# Patient Record
Sex: Male | Born: 1945
Health system: Southern US, Community
[De-identification: ages and names within clinical notes are randomized; demographics above are authoritative.]

## PROBLEM LIST (undated history)

## (undated) DIAGNOSIS — R42 Dizziness and giddiness: Secondary | ICD-10-CM

## (undated) DIAGNOSIS — Z9889 Other specified postprocedural states: Secondary | ICD-10-CM

## (undated) DIAGNOSIS — G629 Polyneuropathy, unspecified: Secondary | ICD-10-CM

## (undated) DIAGNOSIS — K648 Other hemorrhoids: Secondary | ICD-10-CM

## (undated) DIAGNOSIS — I2510 Atherosclerotic heart disease of native coronary artery without angina pectoris: Secondary | ICD-10-CM

## (undated) DIAGNOSIS — G4730 Sleep apnea, unspecified: Secondary | ICD-10-CM

## (undated) DIAGNOSIS — F329 Major depressive disorder, single episode, unspecified: Secondary | ICD-10-CM

## (undated) DIAGNOSIS — R112 Nausea with vomiting, unspecified: Secondary | ICD-10-CM

## (undated) DIAGNOSIS — J45909 Unspecified asthma, uncomplicated: Secondary | ICD-10-CM

## (undated) DIAGNOSIS — M1990 Unspecified osteoarthritis, unspecified site: Secondary | ICD-10-CM

## (undated) DIAGNOSIS — F4310 Post-traumatic stress disorder, unspecified: Secondary | ICD-10-CM

## (undated) DIAGNOSIS — K219 Gastro-esophageal reflux disease without esophagitis: Secondary | ICD-10-CM

## (undated) DIAGNOSIS — I10 Essential (primary) hypertension: Secondary | ICD-10-CM

## (undated) DIAGNOSIS — T4145XA Adverse effect of unspecified anesthetic, initial encounter: Secondary | ICD-10-CM

## (undated) DIAGNOSIS — E785 Hyperlipidemia, unspecified: Secondary | ICD-10-CM

## (undated) DIAGNOSIS — Z87442 Personal history of urinary calculi: Secondary | ICD-10-CM

## (undated) DIAGNOSIS — F32A Depression, unspecified: Secondary | ICD-10-CM

## (undated) DIAGNOSIS — F419 Anxiety disorder, unspecified: Secondary | ICD-10-CM

## (undated) DIAGNOSIS — I82409 Acute embolism and thrombosis of unspecified deep veins of unspecified lower extremity: Secondary | ICD-10-CM

## (undated) DIAGNOSIS — D126 Benign neoplasm of colon, unspecified: Secondary | ICD-10-CM

## (undated) HISTORY — DX: Acute embolism and thrombosis of unspecified deep veins of unspecified lower extremity: I82.409

## (undated) HISTORY — DX: Other hemorrhoids: K64.8

## (undated) HISTORY — DX: Benign neoplasm of colon, unspecified: D12.6

## (undated) HISTORY — DX: Depression, unspecified: F32.A

## (undated) HISTORY — DX: Hyperlipidemia, unspecified: E78.5

## (undated) HISTORY — DX: Polyneuropathy, unspecified: G62.9

## (undated) HISTORY — DX: Dizziness and giddiness: R42

## (undated) HISTORY — PX: UPPER GASTROINTESTINAL ENDOSCOPY: SHX188

## (undated) HISTORY — DX: Sleep apnea, unspecified: G47.30

## (undated) HISTORY — PX: ESOPHAGOGASTRODUODENOSCOPY ENDOSCOPY: SHX5814

## (undated) HISTORY — DX: Unspecified asthma, uncomplicated: J45.909

## (undated) HISTORY — DX: Essential (primary) hypertension: I10

## (undated) HISTORY — PX: COLONOSCOPY: SHX174

## (undated) HISTORY — PX: HEMORRHOID BANDING: SHX5850

## (undated) HISTORY — DX: Post-traumatic stress disorder, unspecified: F43.10

---
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
## 2000-09-02 ENCOUNTER — Other Ambulatory Visit: Admission: RE | Admit: 2000-09-02 | Discharge: 2000-09-02 | Payer: Self-pay | Admitting: Internal Medicine

## 2000-09-02 ENCOUNTER — Encounter (INDEPENDENT_AMBULATORY_CARE_PROVIDER_SITE_OTHER): Payer: Self-pay | Admitting: Specialist

## 2003-11-25 ENCOUNTER — Emergency Department (HOSPITAL_COMMUNITY): Admission: EM | Admit: 2003-11-25 | Discharge: 2003-11-25 | Payer: Self-pay | Admitting: *Deleted

## 2004-06-23 HISTORY — PX: KNEE SURGERY: SHX244

## 2006-06-23 DIAGNOSIS — I82409 Acute embolism and thrombosis of unspecified deep veins of unspecified lower extremity: Secondary | ICD-10-CM

## 2006-06-23 HISTORY — DX: Acute embolism and thrombosis of unspecified deep veins of unspecified lower extremity: I82.409

## 2006-12-20 ENCOUNTER — Ambulatory Visit: Payer: Self-pay | Admitting: Internal Medicine

## 2006-12-20 ENCOUNTER — Inpatient Hospital Stay (HOSPITAL_COMMUNITY): Admission: EM | Admit: 2006-12-20 | Discharge: 2006-12-22 | Payer: Self-pay | Admitting: Emergency Medicine

## 2008-08-15 ENCOUNTER — Encounter: Admission: RE | Admit: 2008-08-15 | Discharge: 2008-08-15 | Payer: Self-pay | Admitting: Otolaryngology

## 2008-10-19 ENCOUNTER — Encounter (INDEPENDENT_AMBULATORY_CARE_PROVIDER_SITE_OTHER): Payer: Self-pay | Admitting: *Deleted

## 2008-10-31 ENCOUNTER — Ambulatory Visit: Payer: Self-pay | Admitting: Internal Medicine

## 2008-11-14 ENCOUNTER — Ambulatory Visit: Payer: Self-pay | Admitting: Internal Medicine

## 2008-12-05 ENCOUNTER — Ambulatory Visit (HOSPITAL_COMMUNITY): Admission: RE | Admit: 2008-12-05 | Discharge: 2008-12-05 | Payer: Self-pay | Admitting: Family Medicine

## 2009-03-07 ENCOUNTER — Observation Stay (HOSPITAL_COMMUNITY): Admission: EM | Admit: 2009-03-07 | Discharge: 2009-03-09 | Payer: Self-pay | Admitting: Emergency Medicine

## 2009-11-26 ENCOUNTER — Observation Stay (HOSPITAL_COMMUNITY): Admission: EM | Admit: 2009-11-26 | Discharge: 2009-11-28 | Payer: Self-pay | Admitting: Emergency Medicine

## 2009-11-27 ENCOUNTER — Encounter (INDEPENDENT_AMBULATORY_CARE_PROVIDER_SITE_OTHER): Payer: Self-pay | Admitting: Emergency Medicine

## 2009-11-27 ENCOUNTER — Ambulatory Visit: Payer: Self-pay | Admitting: Vascular Surgery

## 2010-09-09 LAB — URINALYSIS, ROUTINE W REFLEX MICROSCOPIC
Hgb urine dipstick: NEGATIVE
Ketones, ur: NEGATIVE mg/dL
Specific Gravity, Urine: 1.039 — ABNORMAL HIGH (ref 1.005–1.030)
pH: 5.5 (ref 5.0–8.0)

## 2010-09-09 LAB — BASIC METABOLIC PANEL
BUN: 18 mg/dL (ref 6–23)
Chloride: 105 mEq/L (ref 96–112)
Creatinine, Ser: 1.31 mg/dL (ref 0.4–1.5)
GFR calc non Af Amer: 55 mL/min — ABNORMAL LOW (ref >60)
Glucose, Bld: 90 mg/dL (ref 70–99)
Sodium: 135 mEq/L (ref 135–145)

## 2010-09-09 LAB — LIPID PANEL
HDL: 36 mg/dL — ABNORMAL LOW (ref >39)
LDL Cholesterol: 100 mg/dL — ABNORMAL HIGH (ref 0–99)
LDL Cholesterol: 93 mg/dL (ref 0–99)
Total CHOL/HDL Ratio: 4.2 RATIO
Total CHOL/HDL Ratio: 4.3 RATIO
Triglycerides: 78 mg/dL (ref <150)
Triglycerides: 95 mg/dL (ref <150)
VLDL: 16 mg/dL (ref 0–40)
VLDL: 19 mg/dL (ref 0–40)

## 2010-09-09 LAB — COMPREHENSIVE METABOLIC PANEL
ALT: 16 U/L (ref 0–53)
Alkaline Phosphatase: 64 U/L (ref 39–117)
BUN: 17 mg/dL (ref 6–23)
Calcium: 8.4 mg/dL (ref 8.4–10.5)
Creatinine, Ser: 1.36 mg/dL (ref 0.4–1.5)
GFR calc non Af Amer: 53 mL/min — ABNORMAL LOW (ref >60)
Glucose, Bld: 144 mg/dL — ABNORMAL HIGH (ref 70–99)
Total Bilirubin: 0.9 mg/dL (ref 0.3–1.2)
Total Protein: 6 g/dL (ref 6.0–8.3)

## 2010-09-09 LAB — DIFFERENTIAL
Basophils Absolute: 0 10*3/uL (ref 0.0–0.1)
Monocytes Relative: 10 % (ref 3–12)
Neutrophils Relative %: 62 % (ref 43–77)

## 2010-09-09 LAB — CBC
HCT: 45.3 % (ref 39.0–52.0)
Hemoglobin: 15.5 g/dL (ref 13.0–17.0)
MCHC: 34.3 g/dL (ref 30.0–36.0)
RBC: 4.65 MIL/uL (ref 4.22–5.81)

## 2010-09-09 LAB — PROTIME-INR: Prothrombin Time: 13.2 seconds (ref 11.6–15.2)

## 2010-09-09 LAB — CK TOTAL AND CKMB (NOT AT ARMC): Relative Index: INVALID (ref 0.0–2.5)

## 2010-09-09 LAB — HEMOGLOBIN A1C
Hgb A1c MFr Bld: 5.8 % — ABNORMAL HIGH (ref <5.7)
Mean Plasma Glucose: 120 mg/dL — ABNORMAL HIGH (ref <117)

## 2010-09-27 LAB — DIFFERENTIAL
Basophils Absolute: 0 10*3/uL (ref 0.0–0.1)
Basophils Relative: 0 % (ref 0–1)
Eosinophils Absolute: 0 10*3/uL (ref 0.0–0.7)
Eosinophils Relative: 0 % (ref 0–5)
Monocytes Absolute: 0.3 10*3/uL (ref 0.1–1.0)
Neutro Abs: 11 10*3/uL — ABNORMAL HIGH (ref 1.7–7.7)

## 2010-09-27 LAB — BASIC METABOLIC PANEL
BUN: 20 mg/dL (ref 6–23)
Creatinine, Ser: 1.24 mg/dL (ref 0.4–1.5)
GFR calc non Af Amer: 59 mL/min — ABNORMAL LOW (ref >60)

## 2010-09-27 LAB — CBC
HCT: 37.6 % — ABNORMAL LOW (ref 39.0–52.0)
MCV: 98.8 fL (ref 78.0–100.0)
Platelets: 255 10*3/uL (ref 150–400)
Platelets: 230 10*3/uL (ref 150–400)
WBC: 13 10*3/uL — ABNORMAL HIGH (ref 4.0–10.5)
WBC: 11.6 10*3/uL — ABNORMAL HIGH (ref 4.0–10.5)

## 2010-09-27 LAB — COMPREHENSIVE METABOLIC PANEL
ALT: 16 U/L (ref 0–53)
AST: 24 U/L (ref 0–37)
Albumin: 3.7 g/dL (ref 3.5–5.2)
Alkaline Phosphatase: 61 U/L (ref 39–117)
BUN: 18 mg/dL (ref 6–23)
Chloride: 106 mEq/L (ref 96–112)
Potassium: 3.4 mEq/L — ABNORMAL LOW (ref 3.5–5.1)
Total Bilirubin: 0.8 mg/dL (ref 0.3–1.2)

## 2010-09-27 LAB — POCT CARDIAC MARKERS: Myoglobin, poc: 155 ng/mL (ref 12–200)

## 2010-11-05 NOTE — H&P (Signed)
Andrew Bates, Andrew Bates                ACCOUNT NO.:  [id number]   MEDICAL RECORD NO.:  [id number]          PATIENT TYPE:  INP   LOCATION:  A219                          FACILITY:  [facility]   PHYSICIAN:  Dorris Singh, DO    DATE OF BIRTH:  10-Mar-1946   DATE OF ADMISSION:  12/20/2006  DATE OF DISCHARGE:  LH                              HISTORY & PHYSICAL   CHIEF COMPLAINT:  Chest pain.   HISTORY OF PRESENT ILLNESS:  The patient is a 65 year old, Caucasian  male who presented to the [facility] Emergency Room with a 3-day history  of chest pain.  He stated the pain was gradual.  He had noted that it  was located in the left chest with some radiation to his left arm as  well as left jaw, waxing and waning.  He stated that the pain was rated  6/10 at its worst.  He did admit to having some nausea, but also admits  to having nausea and motion sickness with his diagnosed Meniere's  disease.  At that point in time, he stated he just did not feel right  and his wife then brought him to the emergency room for evaluation.   PAST MEDICAL HISTORY:  1. Meniere's disease.  2. Asthma.  3. Bronchitis.   PAST SURGICAL HISTORY:  Knee surgery on right knee for meniscal tear.   SOCIAL HISTORY:  The patient is a nondrinker, probably socially, if  that.  Denies any drug abuse, but states that he quit smoking over 10  years ago and would only smoke cigars at that point in time.   ALLERGIES:  CODEINE.   CURRENT MEDICATIONS:  Niacin oral and vitamin B12 orally daily.  He uses  this for the treatment of his Meniere's disease.   REVIEW OF SYSTEMS:  GENERAL:  He denies any night sweats or weight  change, but did have weakness.  HEENT:  Denies any blurred vision.  He  does have problems with vertigo.  Denies any bleeding gums, sore throat  or hoarseness, but is positive for jaw pain.  CARDIAC:  Positive for  chest pain and chest pressure.  RESPIRATORY:  Positive shortness of  breath.  GI:  Positive for  vomiting.  GU:  Negative for hesitancy,  urgency or loss of control.  MUSCULOSKELETAL:  Positive left arm pain.  ENDOCRINE:  Positive for sweating.   PHYSICAL EXAMINATION:  VITAL SIGNS:  Temperature 97.3, pulse 49,  respirations 16, systolic blood pressure 122, diastolic 73.  GENERAL:  This is a 65 year old, Caucasian male who is well-developed,  well-nourished in no acute distress.  Affect is appropriate and hygiene  is good.  SKIN:  Tan, no scars, bruises or tattoos noted.  HEENT:  Head is normocephalic, atraumatic.  Eyes are equal and reactive  to lightly bilaterally.  No discharge.  External canals are intact.  Nose with no discharge and symmetrical.  Teeth are in fair repair.  No  erythema or exudate.  NECK:  Full range of motion, no tracheal deviation.  Thyroid is normal.  HEART:  Regular rate and rhythm without  murmur noted.  S1, S2.  LUNGS:  Chest is symmetrical with respirations.  Clear to auscultation  bilaterally with no wheezes, rales or rhonchi.  ABDOMEN:  Soft, nontender, nondistended, no rebound, masses or guarding  noted.  MUSCULOSKELETAL:  No muscle atrophy.  Full range of joint motion of all  four extremities.  Strength is 5/5.  NEUROLOGIC:  Cranial nerves 2-12 grossly intact.   LABORATORY DATA AND X-RAY FINDINGS:  While in the emergency room, EKG  showed sinus bradycardia as well as repeat EKG this morning, June 30,  showing sinus bradycardia.  The patient had a chest x-ray done that  showed minimal atelectasis at the left lung base, otherwise no acute  findings.   Prothrombin time was done with INR of 0.9.  His basic metabolic panel  with sodium 137, potassium 3.5, chloride 105, carbon dioxide 24, glucose  81, BUN 19, creatinine 1.13.  CBC was done with white count of 8.6,  hemoglobin 16.1, hematocrit 46.5, platelets 276.  Will do cardiac  markers today x2.   ASSESSMENT:  1. Chest pain.  2. Bradycardia.   PLAN:  Will admit the patient to the service of  [facility].  Will place  the patient on telemetry as well as doing serial cardiac markers and  will obtain a cardiology consult.  EKG showed bradycardia.  Will have  cardiology consult and participate regarding that as well.  Will  continue with GI and DVT prophylaxis.  Will continue to monitor the  patient.      Dorris Singh, DO  Electronically Signed     CB/MEDQ  D:  12/21/2006  T:  12/21/2006  Job:  [id number]

## 2010-11-05 NOTE — Discharge Summary (Signed)
NAMEKEMPER, [name]                ACCOUNT NO.:  [id number]   MEDICAL RECORD NO.:  [id number]          PATIENT TYPE:  INP   LOCATION:  A219                          FACILITY:  [facility]   PHYSICIAN:  Dorris Singh, DO    DATE OF BIRTH:  1946-04-08   DATE OF ADMISSION:  12/20/2006  DATE OF DISCHARGE:  07/01/2008LH                               DISCHARGE SUMMARY   ADMISSION DIAGNOSIS:  Chest pain.   DISCHARGE DIAGNOSIS:  Chest pain.   SERVICE:  Patient was admitted to the service of [facility].   PRIMARY CARE PHYSICIAN:  Not listed.   PROCEDURE:  He had a portable chest x-ray on June 29th which showed  minimal atelectasis at the left lung base, otherwise no acute findings.  He had a myocardial perfusion test done that showed a normal stress  Myoview as well as a perfusion ejection fraction which showed an  ejection fraction of 51.   CONSULTATIONS:  [facility] [place] consultant participated on this case.   HISTORY:  Patient is a 65 year old Caucasian male who presented to the  [facility] emergency room with a complaint of chest pain lasting several  days.  Patient then was admitted, and [facility] consulted, who  performed several cardiac tests, a cardiac stress test as well as a  myoperfusion test.  Patient was also monitored for bradycardia that was  noted on ECG in the ER as well as on the floor.  The patient proceeded  through his hospital stay without any changes.  At that point in time,  he was viewed to be stable on July 1 after his testing, and it was  recommended that the patient could then be discharged to home.   DISCHARGE CONDITION:  Good, stable.   DISPOSITION:  Discharge to home.   CURRENT MEDICATIONS:  Patient was not currently on any medications prior  to admission other than niacin and B12, so he will continue those.   His instructions are to follow up with cardiology if he has any other  problems and to follow up with his primary care  physician.      Dorris Singh, DO  Electronically Signed     CB/MEDQ  D:  12/22/2006  T:  12/22/2006  Job:  [id number]

## 2010-11-05 NOTE — Consult Note (Signed)
NAME:  HASSON, GASPARD NO.:  [id number]   MEDICAL RECORD NO.:  [id number]          PATIENT TYPE:  INP   LOCATION:  A219                          FACILITY:  [facility]   PHYSICIAN:  Pricilla Riffle, MD, FACCDATE OF BIRTH:  [birth year]   DATE OF CONSULTATION:  12/21/2006  DATE OF DISCHARGE:                                 CONSULTATION   IDENTIFICATION:  [name] is a 65 year old gentleman whom we are asked  to see regarding chest discomfort and bradycardia.   HISTORY OF PRESENT ILLNESS:  The patient notes over the past several  days an episode of left-sided chest pressure with also associated left  arm discomfort, arm numbness, hand numbness and left jaw discomfort and  left neck pain.  Denies injury.  At worst 6/10.  Waxes and wanes; never  goes away.  No period without discomfort.  Worse in the sitting and  lying position.  No exacerbation with activity.  Has had a headache for  the past 3-4 days, top of head.  [place]  Had a bad episode of dizziness, Meniere's.  Had his head in ice.  A lot  of retching.  Currently though denies reflux symptoms.   Note - for the past 3-4 weeks, he just has not felt well, decreased  energy, decreased appetite, slight nausea.   ALLERGIES:  CODEINE.   PAST MEDICAL HISTORY:  1. Meniere's disease, treated.  Never seen by ENT.  Treated with      niacin and B6.  2. Asthma 1-2 times per year.  3. Bronchitis.   PAST SURGICAL HISTORY:  Right knee surgery.   FAMILY HISTORY:  Aunts and uncles with CAD in their 79s and 30s.  CVA.  Mom died of old age.  Dad had hypertension and cancer.  Two siblings  without CAD.   SOCIAL HISTORY:  The patient drinks occasionally.  Remote occasional  cigar.   REVIEW OF SYSTEMS:  Some shortness of breath, vomiting.  Again,  otherwise all systems reviewed.  Negative for the above problems.   The patient took Zocor for a while in the past.  Reported good.  Stopped.   MEDICATIONS AT HOME:  1. Niacin.  2. B6.   MEDICATIONS HERE IN THE HOSPITAL:  1. Aspirin.  2. Protonix 40.  3. GI cocktail.  4. Lovenox 40 daily.   PHYSICAL EXAMINATION:  GENERAL:  The patient is in no distress other  than he does complain of some left-sided chest discomfort and arm  discomfort.  It never really eased.  HEENT:  Normocephalic, atraumatic.  Extraocular movements intact.  Pupils equal, round and reactive to light.  NECK:  JVP is normal.  No thyromegaly or bruits.  LUNGS:  Clear without wheezes.  CARDIAC:  Regular rate and rhythm.  S1-S2.  No S3 or S4 or murmurs.  ABDOMEN:  Benign.  No masses.  No hepatomegaly.  EXTREMITIES:  Good posterior tibial pulses, 2+.  No lower extremity  edema.  Feet warm.   CHEST X-RAY:  Minimal atelectasis.  Otherwise, no acute disease.   ELECTROCARDIOGRAM:  A 12-lead EKG - sinus bradycardia at 51 beats per  minute, early transition.   IMPRESSION:  The patient is a 65 year old with no prior cardiac history  who has not felt right in weeks.  Decreased appetite, decreased energy,  slight nausea.  Over the past 3-4 days began experiencing some chest  pressure, arm pain, numbness, jaw pain.  Has a history of severe  Meniere's exacerbation Friday with a lot of retching.  Question GI,  question musculoskeletal, question cardiac.   The pain and discomfort in the arm, chest and neck are atypical in that  it has not gone away x days, and despite this, initial markers are  negative for injury.  EKG is negative but with relative bradycardia.  Patient asymptomatic.  Would follow.   RECOMMENDATIONS:  Continue to rule out MI.  Continue telemetry, Protonix  b.i.d.  Check lipids in a.m.  Check CRP.  Check TSH.  Check D-dimer.  If  rules out, stress Myoview prior to discharge.  If positive,  catheterization.  Will continue to follow sinus.   Meniere's patient.  Should probably have an ENT evaluation.      Pricilla Riffle, MD, [facility]   Electronically Signed     PVR/MEDQ  D:  12/21/2006  T:  12/21/2006  Job:  [id number]

## 2010-12-11 ENCOUNTER — Ambulatory Visit: Payer: Non-veteran care | Admitting: Physical Therapy

## 2011-04-08 LAB — LIPID PANEL
HDL: 28 — ABNORMAL LOW
Total CHOL/HDL Ratio: 6.1
VLDL: 28

## 2011-04-09 LAB — DIFFERENTIAL
Basophils Absolute: 0
Eosinophils Relative: 6 — ABNORMAL HIGH
Lymphocytes Relative: 27
Lymphocytes Relative: 27
Lymphs Abs: 1.8
Lymphs Abs: 2.3
Monocytes Absolute: 0.8 — ABNORMAL HIGH
Monocytes Absolute: 1 — ABNORMAL HIGH
Neutro Abs: 4.7

## 2011-04-09 LAB — CBC
HCT: 43
Hemoglobin: 14.8
Hemoglobin: 16.1
RBC: 4.9
RDW: 12.7
WBC: 6.8
WBC: 8.6

## 2011-04-09 LAB — TROPONIN I: Troponin I: 0.03

## 2011-04-09 LAB — BASIC METABOLIC PANEL
Calcium: 8.8
GFR calc Af Amer: >60
GFR calc non Af Amer: >60
GFR calc non Af Amer: >60
Glucose, Bld: 81
Potassium: 4
Sodium: 137
Sodium: 141

## 2011-04-09 LAB — POCT CARDIAC MARKERS
Myoglobin, poc: 45.1
Operator id: 228551
Operator id: 228551

## 2011-04-09 LAB — AMYLASE: Amylase: 94

## 2011-04-09 LAB — D-DIMER, QUANTITATIVE: D-Dimer, Quant: 0.22

## 2011-04-09 LAB — CARDIAC PANEL(CRET KIN+CKTOT+MB+TROPI)
CK, MB: 1.4
Relative Index: INVALID
Total CK: 68

## 2011-04-09 LAB — TSH: TSH: 1.772

## 2012-06-23 HISTORY — PX: CARPAL TUNNEL RELEASE: SHX101

## 2012-11-12 ENCOUNTER — Encounter: Payer: Self-pay | Admitting: Internal Medicine

## 2012-11-29 ENCOUNTER — Other Ambulatory Visit (INDEPENDENT_AMBULATORY_CARE_PROVIDER_SITE_OTHER): Payer: Medicare Other

## 2012-11-29 ENCOUNTER — Encounter: Payer: Self-pay | Admitting: Internal Medicine

## 2012-11-29 ENCOUNTER — Ambulatory Visit (INDEPENDENT_AMBULATORY_CARE_PROVIDER_SITE_OTHER): Payer: Medicare Other | Admitting: Internal Medicine

## 2012-11-29 VITALS — BP 140/80 | HR 70 | Ht 71.5 in | Wt 209.4 lb

## 2012-11-29 DIAGNOSIS — K648 Other hemorrhoids: Secondary | ICD-10-CM

## 2012-11-29 DIAGNOSIS — Z86010 Personal history of colonic polyps: Secondary | ICD-10-CM

## 2012-11-29 DIAGNOSIS — R300 Dysuria: Secondary | ICD-10-CM

## 2012-11-29 DIAGNOSIS — K219 Gastro-esophageal reflux disease without esophagitis: Secondary | ICD-10-CM

## 2012-11-29 LAB — URINALYSIS, ROUTINE W REFLEX MICROSCOPIC
Ketones, ur: NEGATIVE
RBC / HPF: NONE SEEN (ref 0–?)
Specific Gravity, Urine: 1.015 (ref 1.000–1.030)
Total Protein, Urine: NEGATIVE
Urine Glucose: NEGATIVE
pH: 6 (ref 5.0–8.0)

## 2012-11-29 MED ORDER — HYDROCORTISONE ACETATE 25 MG RE SUPP: 25.0000 mg | Freq: Every day | RECTAL | Status: DC

## 2012-11-29 NOTE — Progress Notes (Signed)
HISTORY OF PRESENT ILLNESS:  Andrew Bates is a 67 y.o. male with the below listed medical history who presents today with symptomatic hemorrhoids. He is followed in this office for GERD and a history of adenomatous colon polyps on index colonoscopy in 2002. Followup colonoscopy in 2005 and again in 2010 were negative for neoplasia. Internal hemorrhoids noted. He is accompanied today by his wife. Patient reports a 6 month history of prolapsing hemorrhoids and difficulty cleaning. No pain. Occasional trivial blood on the tissue. No change in bowel habits. He has been using over-the-counter gel. His GI review of systems is otherwise negative. For GERD he continues on Prilosec. Good control symptoms  REVIEW OF SYSTEMS:  All non-GI ROS negative except for sinus and allergy trouble, arthritis, back pain, visual change, headaches, hearing problems, vertigo, sleeping problems, and dysuria (requests a urinalysis)  Past Medical History  Diagnosis Date  . Adenomatous colon polyp   . Internal hemorrhoids   . Hypertension   . Hyperlipidemia   . Vertigo   . DVT (deep venous thrombosis) 2008  . Asthma   . Kidney stone   . Peripheral neuropathy     Past Surgical History  Procedure Laterality Date  . Right knee surgery  2006    Social History [name]  reports that he has quit smoking. His smoking use included Cigars. He has never used smokeless tobacco. He reports that he does not drink alcohol or use illicit drugs.  family history includes Hyperlipidemia in his father; Hypertension in his father; Lung cancer in his father; and Thyroid cancer in his mother.  Allergies  Allergen Reactions  . Codeine     REACTION: Headache  . Pentazocine Nausea And Vomiting    Headaches       PHYSICAL EXAMINATION: Vital signs: BP 140/80  Pulse 70  Ht 5' 11.5" (1.816 m)  Wt 209 lb 6.4 oz (94.983 kg)  BMI 28.8 kg/m2  Constitutional: generally well-appearing, no acute distress Psychiatric: alert  and oriented x3, cooperative Eyes: extraocular movements intact, anicteric, conjunctiva pink Mouth: oral pharynx moist, no lesions Neck: supple no lymphadenopathy Cardiovascular: heart regular rate and rhythm, no murmur Lungs: clear to auscultation bilaterally Abdomen: soft, nontender, nondistended, no obvious ascites, no peritoneal signs, normal bowel sounds, no organomegaly Rectal: Prolapsing hemorrhoids. Enlarged smooth prostate. No tenderness. Hemoccult negative stool Extremities: no lower extremity edema bilaterally Skin: no lesions on visible extremities Neuro: No focal deficits.   ASSESSMENT:  #1. Internal hemorrhoids. Symptomatic #2. GERD. Symptoms controlled with PPI #3. History of adenomatous colon polyps. Last colonoscopy 2010 #4. Dysuria. Requests urinalysis  PLAN:  #1. Daily fiber supplementation with Metamucil one to 2 tablespoons #2. Sitz baths twice a day #3. Anusol-HC suppositories at night #4. Urinalysis today. Contact patient with results. If abnormal, see his PCP #5. Continue PPI and reflux precautions #6. Surveillance colonoscopy around May 2015

## 2012-11-29 NOTE — Patient Instructions (Addendum)
Your physician has requested that you go to the basement for the following lab work before leaving today: Urinalysis.    Use a fiber supplement such as Metamucil 1-2 tablespoons daily. We have given you samples to get started.  We have sent the following medications to your pharmacy for you to pick up at your convenience: Anusol HC suppositories.    Use a [employer] daily.Sitz Bath A sitz bath is a warm water bath taken in the sitting position that covers only the hips and buttocks. It may be used for either healing or hygiene purposes. Sitz baths are also used to relieve pain, itching, or muscle spasms. The water may contain medicine. Moist heat will help you heal and relax.  HOME CARE INSTRUCTIONS  Take 3 to 4 sitz baths a day. 1. Fill the bathtub half full with warm water. 2. Sit in the water and open the drain a little. 3. Turn on the warm water to keep the tub half full. Keep the water running constantly. 4. Soak in the water for 15 to 20 minutes. 5. After the sitz bath, pat the affected area dry first. SEEK MEDICAL CARE IF:  You get worse instead of better. Stop the sitz baths if you get worse. MAKE SURE YOU:  Understand these instructions.  Will watch your condition.  Will get help right away if you are not doing well or get worse. Document Released: 03/01/2004 Document Revised: 03/03/2012 Document Reviewed: 09/06/2010 [facility] Patient Information 2014 [place], [place].   cc: Assunta Found, MD

## 2013-09-29 ENCOUNTER — Encounter: Payer: Self-pay | Admitting: Internal Medicine

## 2013-11-16 ENCOUNTER — Encounter (HOSPITAL_COMMUNITY): Payer: Self-pay | Admitting: Emergency Medicine

## 2013-11-16 ENCOUNTER — Inpatient Hospital Stay (HOSPITAL_COMMUNITY)
Admission: EM | Admit: 2013-11-16 | Discharge: 2013-11-18 | DRG: 872 | Disposition: A | Discharge status: Home / Self Care | Payer: Medicare HMO | Attending: Internal Medicine | Admitting: Internal Medicine

## 2013-11-16 ENCOUNTER — Emergency Department (HOSPITAL_COMMUNITY): Payer: Medicare HMO

## 2013-11-16 DIAGNOSIS — I739 Peripheral vascular disease, unspecified: Secondary | ICD-10-CM | POA: Diagnosis present

## 2013-11-16 DIAGNOSIS — Z8249 Family history of ischemic heart disease and other diseases of the circulatory system: Secondary | ICD-10-CM

## 2013-11-16 DIAGNOSIS — Z87442 Personal history of urinary calculi: Secondary | ICD-10-CM

## 2013-11-16 DIAGNOSIS — R3911 Hesitancy of micturition: Secondary | ICD-10-CM | POA: Diagnosis present

## 2013-11-16 DIAGNOSIS — Z86718 Personal history of other venous thrombosis and embolism: Secondary | ICD-10-CM

## 2013-11-16 DIAGNOSIS — Z87891 Personal history of nicotine dependence: Secondary | ICD-10-CM

## 2013-11-16 DIAGNOSIS — J45909 Unspecified asthma, uncomplicated: Secondary | ICD-10-CM | POA: Diagnosis present

## 2013-11-16 DIAGNOSIS — N419 Inflammatory disease of prostate, unspecified: Secondary | ICD-10-CM | POA: Diagnosis present

## 2013-11-16 DIAGNOSIS — G589 Mononeuropathy, unspecified: Secondary | ICD-10-CM | POA: Diagnosis present

## 2013-11-16 DIAGNOSIS — N390 Urinary tract infection, site not specified: Secondary | ICD-10-CM

## 2013-11-16 DIAGNOSIS — N200 Calculus of kidney: Secondary | ICD-10-CM | POA: Diagnosis present

## 2013-11-16 DIAGNOSIS — E785 Hyperlipidemia, unspecified: Secondary | ICD-10-CM | POA: Diagnosis present

## 2013-11-16 DIAGNOSIS — R42 Dizziness and giddiness: Secondary | ICD-10-CM | POA: Diagnosis present

## 2013-11-16 DIAGNOSIS — I10 Essential (primary) hypertension: Secondary | ICD-10-CM | POA: Diagnosis present

## 2013-11-16 DIAGNOSIS — Z808 Family history of malignant neoplasm of other organs or systems: Secondary | ICD-10-CM

## 2013-11-16 DIAGNOSIS — K219 Gastro-esophageal reflux disease without esophagitis: Secondary | ICD-10-CM | POA: Diagnosis present

## 2013-11-16 DIAGNOSIS — N10 Acute tubulo-interstitial nephritis: Secondary | ICD-10-CM | POA: Diagnosis present

## 2013-11-16 DIAGNOSIS — E663 Overweight: Secondary | ICD-10-CM | POA: Diagnosis present

## 2013-11-16 DIAGNOSIS — Z801 Family history of malignant neoplasm of trachea, bronchus and lung: Secondary | ICD-10-CM

## 2013-11-16 DIAGNOSIS — A419 Sepsis, unspecified organism: Principal | ICD-10-CM | POA: Diagnosis present

## 2013-11-16 DIAGNOSIS — N12 Tubulo-interstitial nephritis, not specified as acute or chronic: Secondary | ICD-10-CM

## 2013-11-16 LAB — COMPREHENSIVE METABOLIC PANEL
ALBUMIN: 3.9 g/dL (ref 3.5–5.2)
ALT: 26 U/L (ref 0–53)
AST: 18 U/L (ref 0–37)
Alkaline Phosphatase: 114 U/L (ref 39–117)
BILIRUBIN TOTAL: 1.6 mg/dL — AB (ref 0.3–1.2)
BUN: 15 mg/dL (ref 6–23)
CHLORIDE: 98 meq/L (ref 96–112)
CO2: 24 mEq/L (ref 19–32)
CREATININE: 1.18 mg/dL (ref 0.50–1.35)
Calcium: 9.4 mg/dL (ref 8.4–10.5)
GFR calc non Af Amer: 62 mL/min — ABNORMAL LOW (ref >90)
GFR, EST AFRICAN AMERICAN: 72 mL/min — AB (ref >90)
GLUCOSE: 116 mg/dL — AB (ref 70–99)
Potassium: 4.1 mEq/L (ref 3.7–5.3)
Sodium: 136 mEq/L — ABNORMAL LOW (ref 137–147)
TOTAL PROTEIN: 7.4 g/dL (ref 6.0–8.3)

## 2013-11-16 LAB — CK: CK TOTAL: 115 U/L (ref 7–232)

## 2013-11-16 LAB — URINALYSIS, ROUTINE W REFLEX MICROSCOPIC
GLUCOSE, UA: NEGATIVE mg/dL
Ketones, ur: 15 mg/dL — AB
NITRITE: NEGATIVE
PROTEIN: 30 mg/dL — AB
Specific Gravity, Urine: 1.015 (ref 1.005–1.030)
UROBILINOGEN UA: 1 mg/dL (ref 0.0–1.0)
pH: 7 (ref 5.0–8.0)

## 2013-11-16 LAB — URINE MICROSCOPIC-ADD ON

## 2013-11-16 LAB — CBC WITH DIFFERENTIAL/PLATELET
BASOS ABS: 0 10*3/uL (ref 0.0–0.1)
BASOS PCT: 0 % (ref 0–1)
EOS PCT: 0 % (ref 0–5)
Eosinophils Absolute: 0 10*3/uL (ref 0.0–0.7)
HEMATOCRIT: 46.3 % (ref 39.0–52.0)
Hemoglobin: 15.9 g/dL (ref 13.0–17.0)
Lymphocytes Relative: 7 % — ABNORMAL LOW (ref 12–46)
Lymphs Abs: 1.1 10*3/uL (ref 0.7–4.0)
MCH: 32.6 pg (ref 26.0–34.0)
MCHC: 34.3 g/dL (ref 30.0–36.0)
MCV: 94.9 fL (ref 78.0–100.0)
MONO ABS: 2.3 10*3/uL — AB (ref 0.1–1.0)
MONOS PCT: 13 % — AB (ref 3–12)
Neutro Abs: 14 10*3/uL — ABNORMAL HIGH (ref 1.7–7.7)
Neutrophils Relative %: 80 % — ABNORMAL HIGH (ref 43–77)
Platelets: 200 10*3/uL (ref 150–400)
RBC: 4.88 MIL/uL (ref 4.22–5.81)
RDW: 13.4 % (ref 11.5–15.5)
WBC: 17.5 10*3/uL — ABNORMAL HIGH (ref 4.0–10.5)

## 2013-11-16 LAB — CBG MONITORING, ED: Glucose-Capillary: 132 mg/dL — ABNORMAL HIGH (ref 70–99)

## 2013-11-16 LAB — LACTIC ACID, PLASMA: Lactic Acid, Venous: 1.4 mmol/L (ref 0.5–2.2)

## 2013-11-16 MED ORDER — GABAPENTIN 400 MG PO CAPS
1200.0000 mg | ORAL_CAPSULE | Freq: Three times a day (TID) | ORAL | Status: DC
  Administered 2013-11-16 – 2013-11-17 (×2): 800 mg via ORAL
  Filled 2013-11-16 (×2): qty 3

## 2013-11-16 MED ORDER — ACETAMINOPHEN 325 MG PO TABS
ORAL_TABLET | ORAL | Status: AC
  Administered 2013-11-16: 650 mg via ORAL
  Filled 2013-11-16: qty 2

## 2013-11-16 MED ORDER — ASPIRIN EC 81 MG PO TBEC
81.0000 mg | DELAYED_RELEASE_TABLET | Freq: Every morning | ORAL | Status: DC
  Administered 2013-11-17 – 2013-11-18 (×2): 81 mg via ORAL
  Filled 2013-11-16 (×2): qty 1

## 2013-11-16 MED ORDER — SODIUM CHLORIDE 0.9 % IV BOLUS (SEPSIS)
500.0000 mL | Freq: Once | INTRAVENOUS | Status: AC
  Administered 2013-11-16: 500 mL via INTRAVENOUS

## 2013-11-16 MED ORDER — MORPHINE SULFATE 4 MG/ML IJ SOLN
4.0000 mg | Freq: Once | INTRAMUSCULAR | Status: AC
  Administered 2013-11-16: 4 mg via INTRAVENOUS
  Filled 2013-11-16: qty 1

## 2013-11-16 MED ORDER — DIAZEPAM 2 MG PO TABS: 2.0000 mg | ORAL_TABLET | Freq: Three times a day (TID) | ORAL | Status: DC | PRN

## 2013-11-16 MED ORDER — ACETAMINOPHEN 325 MG PO TABS
650.0000 mg | ORAL_TABLET | Freq: Four times a day (QID) | ORAL | Status: DC | PRN
  Administered 2013-11-16: 650 mg via ORAL

## 2013-11-16 MED ORDER — PROMETHAZINE HCL 25 MG/ML IJ SOLN
12.5000 mg | Freq: Once | INTRAMUSCULAR | Status: AC
  Administered 2013-11-16: 12.5 mg via INTRAVENOUS
  Filled 2013-11-16: qty 1

## 2013-11-16 MED ORDER — ACETAMINOPHEN 325 MG PO TABS
650.0000 mg | ORAL_TABLET | Freq: Four times a day (QID) | ORAL | Status: DC | PRN
  Administered 2013-11-17 – 2013-11-18 (×2): 650 mg via ORAL
  Filled 2013-11-16 (×2): qty 2

## 2013-11-16 MED ORDER — ZOLPIDEM TARTRATE 5 MG PO TABS
5.0000 mg | ORAL_TABLET | Freq: Once | ORAL | Status: AC
  Administered 2013-11-16: 5 mg via ORAL
  Filled 2013-11-16: qty 1

## 2013-11-16 MED ORDER — CIPROFLOXACIN IN D5W 400 MG/200ML IV SOLN
400.0000 mg | Freq: Two times a day (BID) | INTRAVENOUS | Status: DC
  Administered 2013-11-16 – 2013-11-18 (×4): 400 mg via INTRAVENOUS
  Filled 2013-11-16 (×4): qty 200

## 2013-11-16 MED ORDER — ENOXAPARIN SODIUM 40 MG/0.4ML ~~LOC~~ SOLN
40.0000 mg | SUBCUTANEOUS | Status: DC
  Administered 2013-11-16 – 2013-11-17 (×2): 40 mg via SUBCUTANEOUS
  Filled 2013-11-16 (×2): qty 0.4

## 2013-11-16 MED ORDER — ACETAMINOPHEN 650 MG RE SUPP: 650.0000 mg | Freq: Four times a day (QID) | RECTAL | Status: DC | PRN

## 2013-11-16 MED ORDER — NALOXONE HCL 0.4 MG/ML IJ SOLN
0.2000 mg | Freq: Once | INTRAMUSCULAR | Status: AC
  Administered 2013-11-16: 0.2 mg via INTRAVENOUS
  Filled 2013-11-16: qty 1

## 2013-11-16 MED ORDER — DEXTROSE 5 % IV SOLN
1.0000 g | Freq: Once | INTRAVENOUS | Status: AC
  Administered 2013-11-16: 1 g via INTRAVENOUS
  Filled 2013-11-16: qty 10

## 2013-11-16 MED ORDER — LOSARTAN POTASSIUM 25 MG PO TABS
12.5000 mg | ORAL_TABLET | Freq: Every morning | ORAL | Status: DC
  Administered 2013-11-17: 12.5 mg via ORAL
  Administered 2013-11-18: 12:00:00 via ORAL
  Filled 2013-11-16 (×5): qty 0.5

## 2013-11-16 MED ORDER — ONDANSETRON HCL 4 MG/2ML IJ SOLN
4.0000 mg | Freq: Once | INTRAMUSCULAR | Status: AC
  Administered 2013-11-16: 4 mg via INTRAVENOUS
  Filled 2013-11-16: qty 2

## 2013-11-16 MED ORDER — ONDANSETRON HCL 4 MG/2ML IJ SOLN
4.0000 mg | Freq: Four times a day (QID) | INTRAMUSCULAR | Status: DC | PRN
  Administered 2013-11-17: 4 mg via INTRAVENOUS
  Filled 2013-11-16: qty 2

## 2013-11-16 MED ORDER — PANTOPRAZOLE SODIUM 40 MG PO TBEC
40.0000 mg | DELAYED_RELEASE_TABLET | Freq: Every day | ORAL | Status: DC
  Administered 2013-11-17 – 2013-11-18 (×2): 40 mg via ORAL
  Filled 2013-11-16 (×2): qty 1

## 2013-11-16 MED ORDER — BIOTENE DRY MOUTH MT LIQD
15.0000 mL | Freq: Two times a day (BID) | OROMUCOSAL | Status: DC
  Administered 2013-11-16 – 2013-11-17 (×3): 15 mL via OROMUCOSAL

## 2013-11-16 MED ORDER — ACETAMINOPHEN 325 MG PO TABS: 650.0000 mg | ORAL_TABLET | Freq: Once | ORAL | Status: DC

## 2013-11-16 MED ORDER — MECLIZINE HCL 12.5 MG PO TABS: 25.0000 mg | ORAL_TABLET | Freq: Three times a day (TID) | ORAL | Status: DC | PRN

## 2013-11-16 MED ORDER — SODIUM CHLORIDE 0.9 % IV SOLN
INTRAVENOUS | Status: DC
  Administered 2013-11-17 (×3): via INTRAVENOUS

## 2013-11-16 MED ORDER — SIMVASTATIN 20 MG PO TABS
40.0000 mg | ORAL_TABLET | Freq: Every evening | ORAL | Status: DC
  Administered 2013-11-17: 40 mg via ORAL
  Filled 2013-11-16: qty 2

## 2013-11-16 MED ORDER — ONDANSETRON HCL 4 MG PO TABS: 4.0000 mg | ORAL_TABLET | Freq: Four times a day (QID) | ORAL | Status: DC | PRN

## 2013-11-16 NOTE — ED Notes (Signed)
Pt remains clammy and nauseous. Pt reports "if i stay still my nausea and dizziness is tolerable." EDP aware and at bedside.

## 2013-11-16 NOTE — ED Provider Notes (Signed)
CSN: [id number]     Arrival date & time 11/16/13  1505  This chart was scribed for [employer]. Alvino Chapel, MD by Ladene Artist, ED Scribe. The patient was seen in room APA18/APA18. Patient's care was started at 4:12 PM.   Chief Complaint  Patient presents with  . Fever   The history is provided by the patient. No language interpreter was used.    HPI Comments: Andrew Bates is a 68 y.o. male, with a h/o vertigo, who presents to the Emergency Department complaining of fever onset 3 days ago. ED temperature 102.9 F. Pt reports doing yard work Monday for 1.5 hours when he became very hot. Pt states that he went inside to cool down for an hour before returning outside to continue working. He reports associated SOB, nausea, diarrhea, dysuria onset 1 month ago, dizziness, HA, difficulty sleeping, pressure in bladder, arthralgias in hips and up. He denies vomit, sore throat. Pt was seen at [facility] on 11/09/13 for neuropathy. He has also been seen by [facility] for similar symptoms.  Past Medical History  Diagnosis Date  . Adenomatous colon polyp   . Internal hemorrhoids   . Hypertension   . Hyperlipidemia   . Vertigo   . DVT (deep venous thrombosis) 2008  . Asthma   . Kidney stone   . Peripheral neuropathy    Past Surgical History  Procedure Laterality Date  . Right knee surgery  2006  . Carpal tunnel release     Family History  Problem Relation Age of Onset  . Hypertension Father   . Hyperlipidemia Father   . Lung cancer Father   . Thyroid cancer Mother    History  Substance Use Topics  . Smoking status: Former Smoker    Types: Cigars  . Smokeless tobacco: Never Used  . Alcohol Use: No    Review of Systems  Constitutional: Positive for fever.  HENT: Negative for sore throat.   Respiratory: Positive for shortness of breath.   Gastrointestinal: Positive for nausea and diarrhea. Negative for vomiting.  Genitourinary: Positive for dysuria.  Musculoskeletal: Positive for arthralgias.   Neurological: Positive for dizziness and headaches.  All other systems reviewed and are negative.   Allergies  Codeine; Morphine and related; and Pentazocine  Home Medications   Prior to Admission medications   Medication Sig Start Date End Date Taking? Authorizing Provider  aspirin 81 MG tablet Take 81 mg by mouth daily.    Historical Provider, MD  diazepam (VALIUM) 2 MG tablet Take 2 mg by mouth every 8 (eight) hours as needed for anxiety.    Historical Provider, MD  gabapentin (NEURONTIN) 400 MG capsule Take 1,200 mg by mouth 3 (three) times daily.    Historical Provider, MD  hydrocortisone (ANUSOL-HC) 25 MG suppository Place 1 suppository (25 mg total) rectally at bedtime. 11/29/12   Irene Shipper, MD  losartan (COZAAR) 25 MG tablet Take 12.5 mg by mouth daily.    Historical Provider, MD  meclizine (ANTIVERT) 25 MG tablet Take 25 mg by mouth every 8 (eight) hours as needed for dizziness.    Historical Provider, MD  omeprazole (PRILOSEC) 20 MG capsule Take 20 mg by mouth daily.    Historical Provider, MD  simvastatin (ZOCOR) 40 MG tablet Take 40 mg by mouth every evening.    Historical Provider, MD   Triage Vitals: BP 138/94  Pulse 94  Temp(Src) 102.9 F (39.4 C) (Oral)  Resp 18  SpO2 92% Physical Exam  Nursing note and  vitals reviewed. Constitutional: He appears well-developed and well-nourished. No distress.  Awake, alert, uncomfortable appearance   HENT:  Head: Normocephalic and atraumatic.  Right Ear: Tympanic membrane normal.  Left Ear: Tympanic membrane normal.  Mouth/Throat: Posterior oropharyngeal erythema present. No oropharyngeal exudate.  Eyes: Conjunctivae are normal. Pupils are equal, round, and reactive to light. No scleral icterus.  Neck: Normal range of motion. Neck supple.  Cardiovascular: Normal rate.   Pulmonary/Chest: Effort normal and breath sounds normal. No respiratory distress. He has no wheezes.  Abdominal: Soft. Bowel sounds are normal. He exhibits  no mass. There is no tenderness. There is CVA tenderness (mild on the R). There is no rebound and no guarding.  Musculoskeletal: Normal range of motion. He exhibits no edema.  Lymphadenopathy:    He has cervical adenopathy.  Neurological: He is alert.  No meningeal signs  Skin: Skin is warm and dry. He is not diaphoretic.  Psychiatric: He has a normal mood and affect.    ED Course  Procedures (including critical care time) DIAGNOSTIC STUDIES: Oxygen Saturation is 92% on RA, adequate by my interpretation.    COORDINATION OF CARE: 4:22 PM Discussed treatment plan with pt at bedside and pt agreed to plan.  Labs Review Labs Reviewed  CBC WITH DIFFERENTIAL - Abnormal; Notable for the following:    WBC 17.5 (*)    Neutrophils Relative % 80 (*)    Neutro Abs 14.0 (*)    Lymphocytes Relative 7 (*)    Monocytes Relative 13 (*)    Monocytes Absolute 2.3 (*)    All other components within normal limits  COMPREHENSIVE METABOLIC PANEL - Abnormal; Notable for the following:    Sodium 136 (*)    Glucose, Bld 116 (*)    Total Bilirubin 1.6 (*)    GFR calc non Af Amer 62 (*)    GFR calc Af Amer 72 (*)    All other components within normal limits  URINALYSIS, ROUTINE W REFLEX MICROSCOPIC - Abnormal; Notable for the following:    Hgb urine dipstick MODERATE (*)    Bilirubin Urine SMALL (*)    Ketones, ur 15 (*)    Protein, ur 30 (*)    Leukocytes, UA MODERATE (*)    All other components within normal limits  URINE MICROSCOPIC-ADD ON - Abnormal; Notable for the following:    Bacteria, UA FEW (*)    All other components within normal limits  CBC - Abnormal; Notable for the following:    WBC 15.8 (*)    RBC 4.21 (*)    All other components within normal limits  CBG MONITORING, ED - Abnormal; Notable for the following:    Glucose-Capillary 132 (*)    All other components within normal limits  CULTURE, BLOOD (ROUTINE X 2)  CULTURE, BLOOD (ROUTINE X 2)  URINE CULTURE  LACTIC ACID,  PLASMA  CK  COMPREHENSIVE METABOLIC PANEL  CBC    Imaging Review Dg Chest 2 View  11/16/2013   CLINICAL DATA:  fever  EXAM: CHEST - 2 VIEW  COMPARISON:  12/05/2008  FINDINGS: Lungs are clear. Heart size and mediastinal contours are within normal limits. Mildly tortuous aorta. No effusion. Visualized skeletal structures are unremarkable.  IMPRESSION: No acute cardiopulmonary disease.   Electronically Signed   By: Arne Cleveland M.D.   On: 11/16/2013 17:19     EKG Interpretation None      MDM   Final diagnoses:  Pyelonephritis  Vertigo    Patient with fever. Clinical  pyelonephritis. Also has vertigo. He has had vertigo for a while and considered to be basilar spasm. Continued vomiting and nausea. Will admit to internal medicine  I personally performed the services described in this documentation, which was scribed in my presence. The recorded information has been reviewed and is accurate.    Jasper Riling. Alvino Chapel, MD 11/18/13 [id number]

## 2013-11-16 NOTE — ED Notes (Signed)
Pt reports has been working outside [employer] and Tuesday.  Says got very hot and just "gave out."  Reports has been drinking lots of fluids.  C/O pain in joints from hips up.  Reports has had fever x 3 days.  Also c/o headache and difficulty sleeping.    Pt also reports has a lot of pressure in bladder but when he tries to void it comes out slowly.

## 2013-11-16 NOTE — ED Notes (Addendum)
Pt returned from xray diaphoretic and nauseous. Pt given ice several ice packs placed on extremities. Re-assessed pt temp but pt reports just had ice chips. Pt alert and resting. Ordered pt fan as well.EDP aware.

## 2013-11-16 NOTE — ED Notes (Signed)
Re-assessed pt temp with a result of 98.5. Pt remains clammy, will re-assess temperature again.

## 2013-11-16 NOTE — ED Notes (Signed)
EDP consulted concerning pt nausea not improving and that pt not tolerating standing position. Pt attempted to obtain urine sample x2. Pt vomiting. EDP aware and reported to perform in and out cath. Consulted EDP concerning if in and out indicated. EDP reported urine sample vital to care plan.

## 2013-11-16 NOTE — H&P (Signed)
[facility] History and Physical  Andrew Bates KKX:[id number] DOB: 1945/07/29 DOA: 11/16/2013  Referring physician: Dr. Alvino Chapel PCP: Purvis Kilts, MD   Chief Complaint:  Fever with chills for 3 days  HPI:  68 year old male with history of hypertension, hyperlipidemia, GERD, history of vertigo presented to the ED with fevers and chills for past 3 days. Patient reports being out in the sun  Today's back and had high-grade fever up to 104 Fahrenheit with chills and  generalized body aches and nausea. He reports having 2 episodes of vomiting yesterday and today as well. Also has headaches. Patient also reports dysuria for past 3 days. He had similar dysuria about 2 weeks back and took some of his wife's Macrobid at that time. He reports having urinary frequency and straining to urinate. He also reports history of kidney stones without need of surgical intervention. Denies any sick contacts or recent travel.  Patient denies dizziness, chest pain, palpitations, SOB, abdominal pain, bowel symptoms. Reports poor appetite. Denies any confusion .  Course in the ED  the patient was found to be febrile to 102.9 Fahrenheit and WBC of 17.5 thousand meeting criteria for sepsis. UA was positive for UTI. Remaining labs were unremarkable. Lipase and lactic acid were negative. Chest x-ray was negative. urine culture was sent and Patient given an empiric dose of IV Rocephin in the ED.  Patient admitted to [facility]  Review of Systems:  Constitutional: Denies fever, chills, diaphoresis, appetite change and fatigue.  HEENT: Denies photophobia, eye pain, redness, hearing loss, ear pain, congestion, sore throat, rhinorrhea, trouble swallowing, neck pain, neck stiffness  Respiratory: Denies SOB, DOE, cough, chest tightness,  and wheezing.   Cardiovascular: Denies chest pain, palpitations and leg swelling.  Gastrointestinal: Denies nausea, vomiting, abdominal pain, diarrhea, constipation, blood in stool  and abdominal distention.  Genitourinary:  dysuria, urinary frequency and difficulty urinating denies urgency, , hematuria, flank pain  Endocrine: Denies: hot or cold intolerance,  polyuria, polydipsia. Musculoskeletal: Denies myalgias, back pain, joint swelling, arthralgias and gait problem.  Skin: Denies pallor, rash and wound.  Neurological: Headache, weakness, Denies dizziness, seizures, syncope,  light-headedness, numbness . Psychiatric/Behavioral: Denies confusion,   Past Medical History  Diagnosis Date  . Adenomatous colon polyp   . Internal hemorrhoids   . Hypertension   . Hyperlipidemia   . Vertigo   . DVT (deep venous thrombosis) 2008  . Asthma   . Kidney stone   . Peripheral neuropathy    Past Surgical History  Procedure Laterality Date  . Right knee surgery  2006  . Carpal tunnel release     Social History:  reports that he has quit smoking. His smoking use included Cigars. He has never used smokeless tobacco. He reports that he does not drink alcohol or use illicit drugs.  Allergies  Allergen Reactions  . Codeine     REACTION: Headache  . Pentazocine Nausea And Vomiting    Headaches    Family History  Problem Relation Age of Onset  . Hypertension Father   . Hyperlipidemia Father   . Lung cancer Father   . Thyroid cancer Mother     Prior to Admission medications   Medication Sig Start Date End Date Taking? Authorizing Provider  aspirin EC 81 MG tablet Take 81 mg by mouth every morning.   Yes Historical Provider, MD  diazepam (VALIUM) 2 MG tablet Take 2 mg by mouth every 8 (eight) hours as needed (for vertigo).    Yes Historical Provider, MD  gabapentin (NEURONTIN) 400 MG capsule Take 1,200 mg by mouth 3 (three) times daily.   Yes Historical Provider, MD  losartan (COZAAR) 25 MG tablet Take 12.5 mg by mouth every morning.    Yes Historical Provider, MD  meclizine (ANTIVERT) 25 MG tablet Take 25 mg by mouth every 8 (eight) hours as needed for dizziness.    Yes Historical Provider, MD  omeprazole (PRILOSEC) 20 MG capsule Take 20 mg by mouth every morning.    Yes Historical Provider, MD  simvastatin (ZOCOR) 40 MG tablet Take 40 mg by mouth every evening.   Yes Historical Provider, MD     Physical Exam:  Filed Vitals:   11/16/13 1800 11/16/13 1830 11/16/13 1831 11/16/13 1943  BP: 135/89 129/87 129/87 123/81  Pulse: 66 65 65 63  Temp:   98.2 F (36.8 C)   TempSrc:   Oral   Resp:   18 18  SpO2: 95% 94% 95% 95%    Constitutional: Vital signs reviewed.  Elderly male lying in bed appears fatigued  HEENT: no pallor, no icterus, dry oral mucosa Cardiovascular: RRR, S1 normal, S2 normal, no MRG Chest: CTAB, no wheezes, rales, or rhonchi Abdominal: Soft. Non-tender, non-distended, bowel sounds are normal, no masses, organomegaly, or guarding present.  GU: no CVA tenderness Ext: warm, no edema Neurological: A&O x3, non focal  Labs on Admission:  Basic Metabolic Panel:  Recent Labs Lab 11/16/13 1636  NA 136*  K 4.1  CL 98  CO2 24  GLUCOSE 116*  BUN 15  CREATININE 1.18  CALCIUM 9.4   Liver Function Tests:  Recent Labs Lab 11/16/13 1636  AST 18  ALT 26  ALKPHOS 114  BILITOT 1.6*  PROT 7.4  ALBUMIN 3.9   No results found for this basename: LIPASE, AMYLASE,  in the last 168 hours No results found for this basename: AMMONIA,  in the last 168 hours CBC:  Recent Labs Lab 11/16/13 1636  WBC 17.5*  NEUTROABS 14.0*  HGB 15.9  HCT 46.3  MCV 94.9  PLT 200   Cardiac Enzymes:  Recent Labs Lab 11/16/13 1636  CKTOTAL 115   BNP: No components found with this basename: POCBNP,  CBG:  Recent Labs Lab 11/16/13 1746  GLUCAP 132*    Radiological Exams on Admission: Dg Chest 2 View  11/16/2013   CLINICAL DATA:  fever  EXAM: CHEST - 2 VIEW  COMPARISON:  12/05/2008  FINDINGS: Lungs are clear. Heart size and mediastinal contours are within normal limits. Mildly tortuous aorta. No effusion. Visualized skeletal structures  are unremarkable.  IMPRESSION: No acute cardiopulmonary disease.   Electronically Signed   By: Arne Cleveland M.D.   On: 11/16/2013 17:19    EKG:   Assessment/Plan Principal Problem:   Sepsis secondary to acute pyelonephritis/prostatitis Admit to MedSurg Patient received a dose of IV Rocephin in the ED. I will place him on empiric IV ciprofloxacin that will cover for possible prostatitis as well. -Follow urine culture and blood culture -Supportive care with IV fluids, Tylenol and antiemetics  Active Problems: History of kidney stones Currently asymptomatic    Vertigo Stable. Continue meclizine and gabapentin. He reports following at [facility] in the past and was diagnosed to have vertebral artery spasms.    Hypertension Resume losartan    Hyperlipemia Continue statin    GERD (gastroesophageal reflux disease) Continue PPI    Diet: clear Liquids, advance as tolerated  DVT prophylaxis: sq lovenox   Code Status:full code Family Communication: discussed with wife at  bedside Disposition Plan: Home once improved  Conda Wannamaker [facility] Pager [phone]  Total time spent on admission :70 minutes  If 7PM-7AM, please contact night-coverage [email or web address] Password [facility] 11/16/2013, 8:46 PM

## 2013-11-17 DIAGNOSIS — E663 Overweight: Secondary | ICD-10-CM | POA: Diagnosis present

## 2013-11-17 DIAGNOSIS — R3911 Hesitancy of micturition: Secondary | ICD-10-CM | POA: Diagnosis present

## 2013-11-17 LAB — CBC
HCT: 40.4 % (ref 39.0–52.0)
HEMOGLOBIN: 13.4 g/dL (ref 13.0–17.0)
MCH: 31.8 pg (ref 26.0–34.0)
MCHC: 33.2 g/dL (ref 30.0–36.0)
MCV: 96 fL (ref 78.0–100.0)
Platelets: 193 10*3/uL (ref 150–400)
RBC: 4.21 MIL/uL — AB (ref 4.22–5.81)
RDW: 13.5 % (ref 11.5–15.5)
WBC: 15.8 10*3/uL — ABNORMAL HIGH (ref 4.0–10.5)

## 2013-11-17 MED ORDER — TAMSULOSIN HCL 0.4 MG PO CAPS
0.4000 mg | ORAL_CAPSULE | Freq: Every day | ORAL | Status: DC
  Administered 2013-11-17: 0.4 mg via ORAL
  Filled 2013-11-17: qty 1

## 2013-11-17 MED ORDER — ZOLPIDEM TARTRATE 5 MG PO TABS
5.0000 mg | ORAL_TABLET | Freq: Every evening | ORAL | Status: DC | PRN
  Administered 2013-11-17: 5 mg via ORAL
  Filled 2013-11-17: qty 1

## 2013-11-17 MED ORDER — GABAPENTIN 400 MG PO CAPS
800.0000 mg | ORAL_CAPSULE | Freq: Three times a day (TID) | ORAL | Status: DC
  Administered 2013-11-17 – 2013-11-18 (×3): 800 mg via ORAL
  Filled 2013-11-17 (×3): qty 2

## 2013-11-17 NOTE — Progress Notes (Signed)
Nutrition Brief Note  Patient identified on the Malnutrition Screening Tool (MST) Report  Pt has UTI. Febrile prior to admission. Home diet is Regular. No hx of chewing or swallow difficulty. Decreased po intake 2 days prior to admission.  Current diet order is regular and appetite is improving, patient is consuming approximately 75% of meals at this time. Feeds himself. Labs and medications reviewed.   Wt Readings from Last 15 Encounters:  11/16/13 203 lb (92.08 kg)  11/29/12 209 lb 6.4 oz (94.983 kg)   Body mass index is 26.05 kg/(m^2). Patient meets criteria for overeweight based on current BMI. Acute weight decrease 2-3# related to current illness doesn't trigger as clinically significant.  No nutrition interventions warranted at this time. If nutrition issues arise, please consult RD.   Colman Cater MS,RD,CSG,LDN Office: [phone] Pager: [phone]

## 2013-11-17 NOTE — Progress Notes (Signed)
Patient has only been taking 800 mg of gabapentin at home, has decreased dose from 1200 mg.  Patient took 800mg  of ordered 1200mg  at this time.  Will notify MD to change order

## 2013-11-17 NOTE — Progress Notes (Signed)
[facility] PROGRESS NOTE  Andrew Bates:[id number] DOB: Jun 09, 1946 DOA: 11/16/2013 PCP: Purvis Kilts, MD   Code Status: Full code Family Communication: Discussed with his wife Disposition Plan: Discharge to home when clinically improved   Consultants:  None  Procedures:  None  Antibiotics:  Cipro 11/16/13>>  HPI/Subjective: The patient says that he feels a little bit better. He has less dizziness. He has mild nausea but no vomiting. He is tolerating it solid foods, but is taking it slow. He still has some burning/stinging with urination. He complains of urinary hesitancy.  Objective: Filed Vitals:   11/17/13 1655  BP:   Pulse:   Temp: 99.4 F (37.4 C)  Resp:    pulse 69. Respiratory rate 20. Blood pressure 124/79. Oxygen saturation 95%.  Intake/Output Summary (Last 24 hours) at 11/17/13 1844 Last data filed at 11/17/13 1700  Gross per 24 hour  Intake   1430 ml  Output   1800 ml  Net   -370 ml   Filed Weights   11/16/13 2137  Weight: 92.08 kg (203 lb)    Exam:   General: Alert, but slightly ill-appearing 68 year old Caucasian man sitting up in bed.  Cardiovascular: S1, S2, with a soft systolic murmur.  Respiratory: clear to auscultation bilaterally.  Abdomen: positive bowel sounds, soft, mildly tender over the suprapubic area and minimal bilateral flank tenderness. No rigidity or distention. No masses palpated.  Musculoskeletal: no acute hot red joints.   Neurologic: He is alert and oriented x3. Cranial nerves II through XII are intact. No nystagmus.  Data Reviewed: Basic Metabolic Panel:  Recent Labs Lab 11/16/13 1636  NA 136*  K 4.1  CL 98  CO2 24  GLUCOSE 116*  BUN 15  CREATININE 1.18  CALCIUM 9.4   Liver Function Tests:  Recent Labs Lab 11/16/13 1636  AST 18  ALT 26  ALKPHOS 114  BILITOT 1.6*  PROT 7.4  ALBUMIN 3.9   No results found for this basename: LIPASE, AMYLASE,  in the last 168 hours No results  found for this basename: AMMONIA,  in the last 168 hours CBC:  Recent Labs Lab 11/16/13 1636 11/17/13 0551  WBC 17.5* 15.8*  NEUTROABS 14.0*  --   HGB 15.9 13.4  HCT 46.3 40.4  MCV 94.9 96.0  PLT 200 193   Cardiac Enzymes:  Recent Labs Lab 11/16/13 1636  CKTOTAL 115   BNP (last 3 results) No results found for this basename: PROBNP,  in the last 8760 hours CBG:  Recent Labs Lab 11/16/13 1746  GLUCAP 132*    Recent Results (from the past 240 hour(s))  CULTURE, BLOOD (ROUTINE X 2)     Status: None   Collection Time    11/16/13  9:30 PM      Result Value Ref Range Status   Specimen Description BLOOD RIGHT ANTECUBITAL   Final   Special Requests BOTTLES DRAWN AEROBIC ONLY 4CC   Final   Culture NO GROWTH 1 DAY   Final   Report Status PENDING   Incomplete  CULTURE, BLOOD (ROUTINE X 2)     Status: None   Collection Time    11/16/13  9:36 PM      Result Value Ref Range Status   Specimen Description BLOOD RIGHT ARM   Final   Special Requests BOTTLES DRAWN AEROBIC ONLY 3CC   Final   Culture NO GROWTH 1 DAY   Final   Report Status PENDING   Incomplete  Studies: Dg Chest 2 View  11/16/2013   CLINICAL DATA:  fever  EXAM: CHEST - 2 VIEW  COMPARISON:  12/05/2008  FINDINGS: Lungs are clear. Heart size and mediastinal contours are within normal limits. Mildly tortuous aorta. No effusion. Visualized skeletal structures are unremarkable.  IMPRESSION: No acute cardiopulmonary disease.   Electronically Signed   By: Arne Cleveland M.D.   On: 11/16/2013 17:19    Scheduled Meds: . acetaminophen  650 mg Oral Once  . antiseptic oral rinse  15 mL Mouth Rinse BID  . aspirin EC  81 mg Oral q morning - 10a  . ciprofloxacin  400 mg Intravenous Q12H  . enoxaparin (LOVENOX) injection  40 mg Subcutaneous Q24H  . gabapentin  800 mg Oral TID  . losartan  12.5 mg Oral q morning - 10a  . pantoprazole  40 mg Oral Daily  . simvastatin  40 mg Oral QPM  . tamsulosin  0.4 mg Oral QPC supper    Continuous Infusions: . sodium chloride 100 mL/hr at 11/17/13 1429   Assessment/plan:  Principal Problem:   Sepsis secondary to UTI Active Problems:   Acute pyelonephritis   Urinary hesitancy   Kidney stones   Vertigo   Hypertension   Hyperlipemia   GERD (gastroesophageal reflux disease)   Overweight   1. Continue IV Cipro and IV fluid hydration. 2. Decreased Neurontin per patient request. 3. Continue supportive treatment with analgesics and antiemetics; when necessary Antivert and diazepam.  4. Add Flomax for possible BPH/prostatitis. 5. Followup on urine culture results when available.   Time spent: 35 minutes.    Rexene Alberts  [facility] Pager [phone]. If 7PM-7AM, please contact night-coverage at [email or web address], password [facility] 11/17/2013, 6:44 PM  LOS: 1 day

## 2013-11-17 NOTE — Progress Notes (Signed)
UR chart review completed.  

## 2013-11-17 NOTE — Progress Notes (Signed)
Patient voided 375 ml urine in urinal.  Post void residual on bladder scan reveals approx 175 ml left in bladder.

## 2013-11-17 NOTE — Care Management Note (Addendum)
    Page 1 of 1   11/18/2013     12:24:35 PM CARE MANAGEMENT NOTE 11/18/2013  Patient:  JAMI, OHLIN   Account Number:  [id number]  Date Initiated:  11/17/2013  Documentation initiated by:  Theophilus Kinds  Subjective/Objective Assessment:   Pt admitted from home with UTI and sepsis. Pt lives with his wife and will return home at discharge. Pt is independent with ADL's.     Action/Plan:   No CM needs noted.   Anticipated DC Date:  11/19/2013   Anticipated DC Plan:  [place]  CM consult      Choice offered to / List presented to:             Status of service:  Completed, signed off Medicare Important Message given?   (If response is "NO", the following Medicare IM given date fields will be blank) Date Medicare IM given:   Date Additional Medicare IM given:    Discharge Disposition:  HOME/SELF CARE  Per UR Regulation:    If discussed at Long Length of Stay Meetings, dates discussed:    Comments:  11/18/13 [place], RN BSN CM Pt discharged home today. No CM  needs noted.  11/17/13 [place], RN BSN CM

## 2013-11-18 LAB — COMPREHENSIVE METABOLIC PANEL
ALT: 18 U/L (ref 0–53)
AST: 14 U/L (ref 0–37)
Albumin: 2.8 g/dL — ABNORMAL LOW (ref 3.5–5.2)
Alkaline Phosphatase: 94 U/L (ref 39–117)
BUN: 14 mg/dL (ref 6–23)
CALCIUM: 8.1 mg/dL — AB (ref 8.4–10.5)
CO2: 23 mEq/L (ref 19–32)
Chloride: 103 mEq/L (ref 96–112)
Creatinine, Ser: 1.27 mg/dL (ref 0.50–1.35)
GFR calc Af Amer: 66 mL/min — ABNORMAL LOW (ref >90)
GFR calc non Af Amer: 57 mL/min — ABNORMAL LOW (ref >90)
Glucose, Bld: 99 mg/dL (ref 70–99)
Potassium: 3.9 mEq/L (ref 3.7–5.3)
Sodium: 140 mEq/L (ref 137–147)
TOTAL PROTEIN: 5.7 g/dL — AB (ref 6.0–8.3)
Total Bilirubin: 0.7 mg/dL (ref 0.3–1.2)

## 2013-11-18 LAB — CBC
HEMATOCRIT: 38.3 % — AB (ref 39.0–52.0)
HEMOGLOBIN: 12.5 g/dL — AB (ref 13.0–17.0)
MCH: 31.3 pg (ref 26.0–34.0)
MCHC: 32.6 g/dL (ref 30.0–36.0)
MCV: 96 fL (ref 78.0–100.0)
Platelets: 184 10*3/uL (ref 150–400)
RBC: 3.99 MIL/uL — ABNORMAL LOW (ref 4.22–5.81)
RDW: 13.2 % (ref 11.5–15.5)
WBC: 7.7 10*3/uL (ref 4.0–10.5)

## 2013-11-18 MED ORDER — CIPROFLOXACIN 500 MG/5ML (10%) PO SUSR: 500.0000 mg | Freq: Two times a day (BID) | ORAL | Status: DC

## 2013-11-18 MED ORDER — TAMSULOSIN HCL 0.4 MG PO CAPS: 0.4000 mg | ORAL_CAPSULE | Freq: Every day | ORAL | Status: DC

## 2013-11-18 NOTE — Discharge Summary (Signed)
Physician Discharge Summary  Andrew Bates [id number] DOB: April 11, 1946 DOA: 11/16/2013  PCP: Purvis Kilts, MD Urology: Dr. Diona Fanti  Admit date: 11/16/2013 Discharge date: 11/18/2013  Time spent: Greater than 30 minutes  Recommendations for Outpatient Follow-up:  1. The patient was instructed to followup with his urologist as needed.    Discharge Diagnoses:  1. Acute pyelonephritis. 2. Possible superimposed prostatitis. 3. Early sepsis secondary to pyelonephritis. 4. Urinary hesitancy. 5. History of kidney stones. 6. Hypertension. 7. Overweight. 8. History of vertigo.  Discharge Condition: Improved.  Diet recommendation: Heart healthy.  Filed Weights   11/16/13 2137  Weight: 92.08 kg (203 lb)    History of present illness:  68 year old male with history of hypertension, hyperlipidemia, GERD, and vertigo who presented to the ED with fevers and chills for 3 days. He reported being out in the sun. He developed a high-grade fever up to 104 Fahrenheit with chills and generalized body aches and nausea. He reported having 2 episodes of vomiting as well. Also had headaches. Patient also reported dysuria for 3 days. He had similar dysuria about 2 weeks ago and took some of his wife's Macrobid at that time. He reported having urinary frequency and straining to urinate. He also has a history of kidney stones without need for surgical intervention. He denied any sick contacts or recent travel. He denied dizziness, chest pain, palpitations, SOB, abdominal pain, or bowel symptoms. He reported a poor appetite. In the ED, he was febrile with temperature 102.9. His blood pressure and heart rate were within normal limits. His white blood cell count was elevated at 17.5. His urinalysis revealed moderate leukocytes, 7-10 RBCs, and too numerous to count WBCs. He was admitted for further evaluation and management.   Hospital Course:  Blood cultures and a urine culture were ordered in the  emergency department. He was started on IV Cipro. IV hydration was also given. Supportive treatment was given with analgesics and antiemetics. Tylenol was ordered for fever. His chronic medications were continued as tolerated. Flomax was started empirically. He improved clinically and symptomatically. He became afebrile. His white blood cell count normalized to 7.7. His appetite improved. His nausea resolved. Urinary hesitancy subsided significantly. His sensorium resolved. Blood cultures were negative times one day. The urine culture was still pending at the time of discharge.  He was discharged on 12 more days of Cipro. The urine culture will be followed for sensitivities. If the bacteria are not sensitive to Cipro, the patient will be notified.  Procedures:  None  Consultations:  None  Discharge Exam: Filed Vitals:   11/18/13 0430  BP:   Pulse:   Temp: 99.1 F (37.3 C)  Resp:    pulse 70. Respiratory rate 20. Blood pressure 126/74. Oxygen saturation 95%.  General: Pleasant 68 year old man sitting up in bed, in no acute distress. Cardiovascular: S1, S2, with no murmurs rubs or gallops. Respiratory: Clear to auscultation bilaterally. Abdomen: Positive bowel sounds, soft, nontender, nondistended.  Discharge Instructions You were cared for by a hospitalist during your hospital stay. If you have any questions about your discharge medications or the care you received while you were in the hospital after you are discharged, you can call the unit and asked to speak with the hospitalist on call if the hospitalist that took care of you is not available. Once you are discharged, your primary care physician will handle any further medical issues. Please note that NO REFILLS for any discharge medications will be authorized once you are discharged,  as it is imperative that you return to your primary care physician (or establish a relationship with a primary care physician if you do not have one) for  your aftercare needs so that they can reassess your need for medications and monitor your lab values.  Discharge Instructions   Diet general    Complete by:  As directed      Increase activity slowly    Complete by:  As directed             Medication List         aspirin EC 81 MG tablet  Take 81 mg by mouth every morning.     ciprofloxacin 500 MG/5ML (10%) suspension  Commonly known as:  CIPRO  Take 5 mLs (500 mg total) by mouth 2 (two) times daily. Antibiotic to take for 12 more days.     diazepam 2 MG tablet  Commonly known as:  VALIUM  Take 2 mg by mouth every 8 (eight) hours as needed (for vertigo).     gabapentin 400 MG capsule  Commonly known as:  NEURONTIN  Take 800 mg by mouth 3 (three) times daily.     losartan 25 MG tablet  Commonly known as:  COZAAR  Take 12.5 mg by mouth every morning.     meclizine 25 MG tablet  Commonly known as:  ANTIVERT  Take 25 mg by mouth every 8 (eight) hours as needed for dizziness.     omeprazole 20 MG capsule  Commonly known as:  PRILOSEC  Take 20 mg by mouth every morning.     simvastatin 40 MG tablet  Commonly known as:  ZOCOR  Take 40 mg by mouth every evening.     tamsulosin 0.4 MG Caps capsule  Commonly known as:  FLOMAX  Take 1 capsule (0.4 mg total) by mouth daily after supper. For your prostate.       Allergies  Allergen Reactions  . Codeine     REACTION: Headache  . Morphine And Related Nausea And Vomiting  . Pentazocine Nausea And Vomiting    Headaches       Follow-up Information   Follow up with Purvis Kilts, MD. Schedule an appointment as soon as possible for a visit in 2 weeks.   Specialty:  Family Medicine   Contact information:   [address] [place] [address] [phone]       Follow up with Jorja Loa, MD. (As needed)    Specialty:  Urology   Contact information:   [place] [address] [phone]        The results of significant  diagnostics from this hospitalization (including imaging, microbiology, ancillary and laboratory) are listed below for reference.    Significant Diagnostic Studies: Dg Chest 2 View  11/16/2013   CLINICAL DATA:  fever  EXAM: CHEST - 2 VIEW  COMPARISON:  12/05/2008  FINDINGS: Lungs are clear. Heart size and mediastinal contours are within normal limits. Mildly tortuous aorta. No effusion. Visualized skeletal structures are unremarkable.  IMPRESSION: No acute cardiopulmonary disease.   Electronically Signed   By: Arne Cleveland M.D.   On: 11/16/2013 17:19    Microbiology: Recent Results (from the past 240 hour(s))  CULTURE, BLOOD (ROUTINE X 2)     Status: None   Collection Time    11/16/13  9:30 PM      Result Value Ref Range Status   Specimen Description BLOOD RIGHT ANTECUBITAL   Final  Special Requests BOTTLES DRAWN AEROBIC ONLY 4CC   Final   Culture NO GROWTH 1 DAY   Final   Report Status PENDING   Incomplete  CULTURE, BLOOD (ROUTINE X 2)     Status: None   Collection Time    11/16/13  9:36 PM      Result Value Ref Range Status   Specimen Description BLOOD RIGHT ARM   Final   Special Requests BOTTLES DRAWN AEROBIC ONLY 3CC   Final   Culture NO GROWTH 1 DAY   Final   Report Status PENDING   Incomplete     Labs: Basic Metabolic Panel:  Recent Labs Lab 11/16/13 1636 11/18/13 0525  NA 136* 140  K 4.1 3.9  CL 98 103  CO2 24 23  GLUCOSE 116* 99  BUN 15 14  CREATININE 1.18 1.27  CALCIUM 9.4 8.1*   Liver Function Tests:  Recent Labs Lab 11/16/13 1636 11/18/13 0525  AST 18 14  ALT 26 18  ALKPHOS 114 94  BILITOT 1.6* 0.7  PROT 7.4 5.7*  ALBUMIN 3.9 2.8*   No results found for this basename: LIPASE, AMYLASE,  in the last 168 hours No results found for this basename: AMMONIA,  in the last 168 hours CBC:  Recent Labs Lab 11/16/13 1636 11/17/13 0551 11/18/13 0525  WBC 17.5* 15.8* 7.7  NEUTROABS 14.0*  --   --   HGB 15.9 13.4 12.5*  HCT 46.3 40.4 38.3*  MCV  94.9 96.0 96.0  PLT 200 193 184   Cardiac Enzymes:  Recent Labs Lab 11/16/13 1636  CKTOTAL 115   BNP: BNP (last 3 results) No results found for this basename: PROBNP,  in the last 8760 hours CBG:  Recent Labs Lab 11/16/13 1746  GLUCAP 132*       Signed:  Rexene Alberts  [facility] 11/18/2013, 12:35 PM

## 2013-11-18 NOTE — Progress Notes (Signed)
Discharge instructions and prescriptions given, verbalized understanding, out in stable condition ambulatory with staff. 

## 2013-11-19 LAB — URINE CULTURE
Colony Count: NO GROWTH
Culture: NO GROWTH

## 2013-11-21 LAB — CULTURE, BLOOD (ROUTINE X 2)
CULTURE: NO GROWTH
Culture: NO GROWTH

## 2014-02-12 ENCOUNTER — Encounter (HOSPITAL_COMMUNITY): Payer: Self-pay | Admitting: Emergency Medicine

## 2014-02-12 ENCOUNTER — Emergency Department (HOSPITAL_COMMUNITY)
Admission: EM | Admit: 2014-02-12 | Discharge: 2014-02-13 | Disposition: A | Discharge status: Home / Self Care | Payer: Medicare HMO | Attending: Emergency Medicine | Admitting: Emergency Medicine

## 2014-02-12 DIAGNOSIS — Z86718 Personal history of other venous thrombosis and embolism: Secondary | ICD-10-CM | POA: Diagnosis not present

## 2014-02-12 DIAGNOSIS — E785 Hyperlipidemia, unspecified: Secondary | ICD-10-CM | POA: Diagnosis not present

## 2014-02-12 DIAGNOSIS — N12 Tubulo-interstitial nephritis, not specified as acute or chronic: Secondary | ICD-10-CM | POA: Diagnosis not present

## 2014-02-12 DIAGNOSIS — N179 Acute kidney failure, unspecified: Secondary | ICD-10-CM | POA: Diagnosis not present

## 2014-02-12 DIAGNOSIS — Z7982 Long term (current) use of aspirin: Secondary | ICD-10-CM | POA: Insufficient documentation

## 2014-02-12 DIAGNOSIS — N200 Calculus of kidney: Secondary | ICD-10-CM | POA: Diagnosis not present

## 2014-02-12 DIAGNOSIS — Z860100 Personal history of colon polyps, unspecified: Secondary | ICD-10-CM | POA: Insufficient documentation

## 2014-02-12 DIAGNOSIS — Z87891 Personal history of nicotine dependence: Secondary | ICD-10-CM | POA: Insufficient documentation

## 2014-02-12 DIAGNOSIS — J45909 Unspecified asthma, uncomplicated: Secondary | ICD-10-CM | POA: Diagnosis not present

## 2014-02-12 DIAGNOSIS — Z79899 Other long term (current) drug therapy: Secondary | ICD-10-CM | POA: Insufficient documentation

## 2014-02-12 DIAGNOSIS — Z8659 Personal history of other mental and behavioral disorders: Secondary | ICD-10-CM | POA: Diagnosis not present

## 2014-02-12 DIAGNOSIS — K219 Gastro-esophageal reflux disease without esophagitis: Secondary | ICD-10-CM | POA: Insufficient documentation

## 2014-02-12 DIAGNOSIS — I10 Essential (primary) hypertension: Secondary | ICD-10-CM | POA: Insufficient documentation

## 2014-02-12 DIAGNOSIS — R109 Unspecified abdominal pain: Secondary | ICD-10-CM | POA: Insufficient documentation

## 2014-02-12 LAB — URINALYSIS, ROUTINE W REFLEX MICROSCOPIC
BILIRUBIN URINE: NEGATIVE
GLUCOSE, UA: NEGATIVE mg/dL
KETONES UR: NEGATIVE mg/dL
Nitrite: NEGATIVE
PROTEIN: 30 mg/dL — AB
Specific Gravity, Urine: 1.025 (ref 1.005–1.030)
UROBILINOGEN UA: 0.2 mg/dL (ref 0.0–1.0)
pH: 6 (ref 5.0–8.0)

## 2014-02-12 LAB — CBC WITH DIFFERENTIAL/PLATELET
Basophils Absolute: 0 10*3/uL (ref 0.0–0.1)
Basophils Relative: 0 % (ref 0–1)
EOS PCT: 1 % (ref 0–5)
Eosinophils Absolute: 0.1 10*3/uL (ref 0.0–0.7)
HCT: 45.4 % (ref 39.0–52.0)
HEMOGLOBIN: 15.9 g/dL (ref 13.0–17.0)
LYMPHS ABS: 1.1 10*3/uL (ref 0.7–4.0)
LYMPHS PCT: 8 % — AB (ref 12–46)
MCH: 33.3 pg (ref 26.0–34.0)
MCHC: 35 g/dL (ref 30.0–36.0)
MCV: 95 fL (ref 78.0–100.0)
MONOS PCT: 6 % (ref 3–12)
Monocytes Absolute: 0.8 10*3/uL (ref 0.1–1.0)
Neutro Abs: 11.5 10*3/uL — ABNORMAL HIGH (ref 1.7–7.7)
Neutrophils Relative %: 85 % — ABNORMAL HIGH (ref 43–77)
PLATELETS: 215 10*3/uL (ref 150–400)
RBC: 4.78 MIL/uL (ref 4.22–5.81)
RDW: 12.8 % (ref 11.5–15.5)
WBC: 13.6 10*3/uL — AB (ref 4.0–10.5)

## 2014-02-12 LAB — BASIC METABOLIC PANEL
Anion gap: 13 (ref 5–15)
BUN: 18 mg/dL (ref 6–23)
CALCIUM: 9.3 mg/dL (ref 8.4–10.5)
CO2: 25 mEq/L (ref 19–32)
Chloride: 101 mEq/L (ref 96–112)
Creatinine, Ser: 1.77 mg/dL — ABNORMAL HIGH (ref 0.50–1.35)
GFR calc Af Amer: 44 mL/min — ABNORMAL LOW (ref >90)
GFR calc non Af Amer: 38 mL/min — ABNORMAL LOW (ref >90)
GLUCOSE: 109 mg/dL — AB (ref 70–99)
POTASSIUM: 4.7 meq/L (ref 3.7–5.3)
SODIUM: 139 meq/L (ref 137–147)

## 2014-02-12 LAB — URINE MICROSCOPIC-ADD ON

## 2014-02-12 MED ORDER — ONDANSETRON HCL 4 MG/2ML IJ SOLN
4.0000 mg | Freq: Once | INTRAMUSCULAR | Status: AC
  Administered 2014-02-12: 4 mg via INTRAVENOUS
  Filled 2014-02-12: qty 2

## 2014-02-12 MED ORDER — SODIUM CHLORIDE 0.9 % IV SOLN
Freq: Once | INTRAVENOUS | Status: AC
  Administered 2014-02-12: 75 mL/h via INTRAVENOUS

## 2014-02-12 MED ORDER — FENTANYL CITRATE 0.05 MG/ML IJ SOLN
50.0000 ug | Freq: Once | INTRAMUSCULAR | Status: AC
  Administered 2014-02-12: 50 ug via INTRAVENOUS
  Filled 2014-02-12: qty 2

## 2014-02-12 MED ORDER — DEXTROSE 5 % IV SOLN
1.0000 g | Freq: Once | INTRAVENOUS | Status: AC
  Administered 2014-02-12: 100 mg via INTRAVENOUS
  Filled 2014-02-12: qty 10

## 2014-02-12 MED ORDER — LIDOCAINE HCL (PF) 1 % IJ SOLN
INTRAMUSCULAR | Status: AC
  Filled 2014-02-12: qty 5

## 2014-02-12 NOTE — ED Provider Notes (Signed)
CSN: [id number]     Arrival date & time 02/12/14  2049 History   First MD Initiated Contact with Patient 02/12/14 2241     Chief Complaint  Patient presents with  . Flank Pain     (Consider location/radiation/quality/duration/timing/severity/associated sxs/prior Treatment) HPI Comments: 68 year old male, history of hypertension, DVT, prior kidney stone and a recent prior episode of urinary tract infection. He presents complaint of urinary difficulty which she states started several days ago, began as some  urinary hesitancy and has progressed into some dysuria and mild urinary retention. He has some pain to his left flank as well as his left testicle and has associated nausea but denies fevers. The symptoms were gradual in onset and are not persistent, they did not wax and wane, it does not feel similar to his prior kidney stone pain and is not sharp and stabbing. he has been taking anti-inflammatories over the last week without improvement   Patient is a 68 y.o. male presenting with flank pain. The history is provided by the patient and the spouse.  Flank Pain    Past Medical History  Diagnosis Date  . Adenomatous colon polyp   . Internal hemorrhoids   . Hypertension   . Hyperlipidemia   . Vertigo   . DVT (deep venous thrombosis) 2008  . Asthma   . Kidney stone   . Peripheral neuropathy    Past Surgical History  Procedure Laterality Date  . Right knee surgery  2006  . Carpal tunnel release     Family History  Problem Relation Age of Onset  . Hypertension Father   . Hyperlipidemia Father   . Lung cancer Father   . Thyroid cancer Mother    History  Substance Use Topics  . Smoking status: Former Smoker    Types: Cigars  . Smokeless tobacco: Never Used  . Alcohol Use: No    Review of Systems  Genitourinary: Positive for flank pain.  All other systems reviewed and are negative.     Allergies  Codeine; Morphine and related; Other; and Pentazocine  Home  Medications   Prior to Admission medications   Medication Sig Start Date End Date Taking? Authorizing Provider  aspirin EC 81 MG tablet Take 81 mg by mouth every morning.    Historical Provider, MD  diazepam (VALIUM) 2 MG tablet Take 2 mg by mouth every 8 (eight) hours as needed (for vertigo).     Historical Provider, MD  gabapentin (NEURONTIN) 400 MG capsule Take 800 mg by mouth 3 (three) times daily.     Historical Provider, MD  losartan (COZAAR) 25 MG tablet Take 12.5 mg by mouth every morning.     Historical Provider, MD  meclizine (ANTIVERT) 25 MG tablet Take 25 mg by mouth every 8 (eight) hours as needed for dizziness.    Historical Provider, MD  omeprazole (PRILOSEC) 20 MG capsule Take 20 mg by mouth every morning.     Historical Provider, MD  simvastatin (ZOCOR) 40 MG tablet Take 40 mg by mouth every evening.    Historical Provider, MD  tamsulosin (FLOMAX) 0.4 MG CAPS capsule Take 1 capsule (0.4 mg total) by mouth daily after supper. For your prostate. 11/18/13   Rexene Alberts, MD   BP 165/87  Pulse 55  Temp(Src) 98.3 F (36.8 C) (Oral)  Resp 20  Ht 6\' 2"  (1.88 m)  Wt 208 lb (94.348 kg)  BMI 26.69 kg/m2  SpO2 100% Physical Exam  Nursing note and vitals reviewed. Constitutional: He  appears well-developed and well-nourished. No distress.  HENT:  Head: Normocephalic and atraumatic.  Mouth/Throat: Oropharynx is clear and moist. No oropharyngeal exudate.  Eyes: Conjunctivae and EOM are normal. Pupils are equal, round, and reactive to light. Right eye exhibits no discharge. Left eye exhibits no discharge. No scleral icterus.  Neck: Normal range of motion. Neck supple. No JVD present. No thyromegaly present.  Cardiovascular: Normal rate, regular rhythm, normal heart sounds and intact distal pulses.  Exam reveals no gallop and no friction rub.   No murmur heard. Pulmonary/Chest: Effort normal and breath sounds normal. No respiratory distress. He has no wheezes. He has no rales.   Abdominal: Soft. Bowel sounds are normal. He exhibits no distension and no mass. There is tenderness (mild tenderness and fullness to the suprapubic area and left CVA tenderness mild).  Musculoskeletal: Normal range of motion. He exhibits no edema and no tenderness.  Lymphadenopathy:    He has no cervical adenopathy.  Neurological: He is alert. Coordination normal.  Skin: Skin is warm and dry. No rash noted. No erythema.  Psychiatric: He has a normal mood and affect. His behavior is normal.    ED Course  Procedures (including critical care time) Labs Review Labs Reviewed  URINALYSIS, ROUTINE W REFLEX MICROSCOPIC - Abnormal; Notable for the following:    APPearance CLOUDY (*)    Hgb urine dipstick MODERATE (*)    Protein, ur 30 (*)    Leukocytes, UA MODERATE (*)    All other components within normal limits  URINE MICROSCOPIC-ADD ON - Abnormal; Notable for the following:    Bacteria, UA MANY (*)    All other components within normal limits  CBC WITH DIFFERENTIAL  BASIC METABOLIC PANEL    Imaging Review No results found.    MDM   Final diagnoses:  None    The patient is overall well appearing with vital signs which are reassuring with no fever hypotension or tachycardia. He does appear to have a urinary tract infection based on his laboratory workup including many bacteria and too numerous to count white blood cells. His presentation is more convincing for urinary infection or kidney infection and less convincing for kidney stone thus will avoid imaging at this time. Check the function with basic metabolic panel, check for urinary retention with a bladder scan postvoid, antibiotics, pain medicine, nausea medicine. The patient and his wife have been informed of the plan and are in agreement.  Pt requests CT scan to r/o KS - obliged.  VS unremarkable - WBC of 13,000+ present - abx given,   Change of shift - care signed out to Dr. Reather Converse pending CT scan.  Anticipate d/c if  unremarkable.  Meds given in ED:  Medications  lidocaine (PF) (XYLOCAINE) 1 % injection (not administered)  ondansetron (ZOFRAN) injection 4 mg (4 mg Intravenous Given 02/12/14 2317)  fentaNYL (SUBLIMAZE) injection 50 mcg (50 mcg Intravenous Given 02/12/14 2317)  0.9 %  sodium chloride infusion (75 mL/hr Intravenous New Bag/Given 02/12/14 2318)  cefTRIAXone (ROCEPHIN) 1 g in dextrose 5 % 50 mL IVPB (100 mg Intravenous New Bag/Given 02/12/14 2325)    New Prescriptions   CEPHALEXIN (KEFLEX) 500 MG CAPSULE    Take 1 capsule (500 mg total) by mouth 4 (four) times daily.   NAPROXEN (NAPROSYN) 500 MG TABLET    Take 1 tablet (500 mg total) by mouth 2 (two) times daily with a meal.   ONDANSETRON (ZOFRAN ODT) 4 MG DISINTEGRATING TABLET    Take 1 tablet (  4 mg total) by mouth every 8 (eight) hours as needed for nausea.      Johnna Acosta, MD 02/13/14 [phone]

## 2014-02-12 NOTE — ED Notes (Signed)
Pt c/o left flank pain

## 2014-02-13 ENCOUNTER — Emergency Department (HOSPITAL_COMMUNITY): Payer: Medicare HMO

## 2014-02-13 MED ORDER — CIPROFLOXACIN HCL 500 MG PO TABS: 500.0000 mg | ORAL_TABLET | Freq: Two times a day (BID) | ORAL | Status: DC

## 2014-02-13 MED ORDER — ONDANSETRON 4 MG PO TBDP: 4.0000 mg | ORAL_TABLET | Freq: Three times a day (TID) | ORAL | Status: DC | PRN

## 2014-02-13 MED ORDER — HYDROCODONE-ACETAMINOPHEN 5-325 MG PO TABS: 1.0000 | ORAL_TABLET | ORAL | Status: DC | PRN

## 2014-02-13 MED ORDER — CEPHALEXIN 500 MG PO CAPS: 500.0000 mg | ORAL_CAPSULE | Freq: Four times a day (QID) | ORAL | Status: DC

## 2014-02-13 MED ORDER — NAPROXEN 500 MG PO TABS: 500.0000 mg | ORAL_TABLET | Freq: Two times a day (BID) | ORAL | Status: DC

## 2014-02-13 NOTE — Discharge Instructions (Signed)
Please call your doctor for a followup appointment within 24-48 hours. When you talk to your doctor please let them know that you were seen in the emergency department and have them acquire all of your records so that they can discuss the findings with you and formulate a treatment plan to fully care for your new and ongoing problems. Call urology for appointment tomorrow.  If you were given medicines take as directed.  If you are on coumadin or contraceptives realize their levels and effectiveness is altered by many different medicines.  If you have any reaction (rash, tongues swelling, other) to the medicines stop taking and see a physician.   Please follow up as directed and return to the ER or see a physician for new or worsening symptoms.  Thank you. Filed Vitals:   02/12/14 2142  BP: 165/87  Pulse: 55  Temp: 98.3 F (36.8 C)  TempSrc: Oral  Resp: 20  Height: 6\' 2"  (1.88 m)  Weight: 208 lb (94.348 kg)  SpO2: 100%

## 2014-02-13 NOTE — ED Notes (Signed)
Patient given discharge instruction, verbalized understand. IV removed, band aid applied. Patient ambulatory out of the department.  

## 2014-02-13 NOTE — ED Provider Notes (Signed)
Patient's care signed out to followup CT stone study results and reassess. On recheck pain controlled, patient well-appearing, afebrile. CT showed 4 mm left-sided stone with stranding. Patient received Rocephin for urine infection/bilateral nephritis. Discuss case with urology on call and patient will be seen urgently outpatient. Reasons to return given the patient. Nausea meds, antibiotics and pain medicines for home. Ct Abdomen Pelvis Wo Contrast  02/13/2014   CLINICAL DATA:  Left flank pain  EXAM: CT ABDOMEN AND PELVIS WITHOUT CONTRAST  TECHNIQUE: Multidetector CT imaging of the abdomen and pelvis was performed following the standard protocol without IV contrast.  COMPARISON:  None available  FINDINGS: The visualized lung bases are clear.  Limited noncontrast evaluation of the liver is unremarkable. Gallbladder within normal limits. The spleen, adrenal glands, and pancreas demonstrate a normal unenhanced appearance.  Scattered punctate hyperdensities within the right kidney likely reflect small nonobstructive calculi. No stones seen along the course of the right renal collecting system. There is no right-sided obstructive uropathy.  On the left, there is an obstructive 4 mm calculus within the distal left ureter just proximal to the left UVJ there is secondary moderate left hydroureteronephrosis with extensive left periureteral and perinephric fat stranding. Additional tiny punctate nonobstructive calculi present within the mid left kidney.  Stomach within normal limits. No evidence of bowel obstruction. No abnormal wall thickening or inflammatory fat stranding seen about the bowels.  Bladder within normal limits.  Prostate are unremarkable.  No free air or fluid. No adenopathy. Mild to moderate calcified atheromatous disease present within the infrarenal aorta. No intra-abdominal aneurysm.  No acute osseous abnormality. No worrisome lytic or blastic osseous lesion. Chronic anterior wedging deformity of the  L1 vertebral body noted.  IMPRESSION: 1. 4 mm obstructive calculus within the distal left ureter with secondary moderate left hydroureteronephrosis and extensive left perinephric/periureteral fat stranding. 2. Additional punctate nonobstructive calculi within the bilateral kidneys.   Electronically Signed   By: Jeannine Boga M.D.   On: 02/13/2014 00:44    Results and differential diagnosis were discussed with the patient/parent/guardian. Close follow up outpatient was discussed, comfortable with the plan.   Medications  ondansetron (ZOFRAN) injection 4 mg (4 mg Intravenous Given 02/12/14 2317)  fentaNYL (SUBLIMAZE) injection 50 mcg (50 mcg Intravenous Given 02/12/14 2317)  0.9 %  sodium chloride infusion (75 mL/hr Intravenous New Bag/Given 02/12/14 2318)  cefTRIAXone (ROCEPHIN) 1 g in dextrose 5 % 50 mL IVPB (0 g Intravenous Stopped 02/12/14 2355)    Filed Vitals:   02/12/14 2142  BP: 165/87  Pulse: 55  Temp: 98.3 F (36.8 C)  TempSrc: Oral  Resp: 20  Height: 6\' 2"  (1.88 m)  Weight: 208 lb (94.348 kg)  SpO2: 100%       Mariea Clonts, MD 02/13/14 0134

## 2014-02-14 ENCOUNTER — Encounter (HOSPITAL_COMMUNITY)
Admission: RE | Disposition: A | Discharge status: Home / Self Care | Payer: Self-pay | Source: Ambulatory Visit | Attending: Internal Medicine

## 2014-02-14 ENCOUNTER — Encounter (INDEPENDENT_AMBULATORY_CARE_PROVIDER_SITE_OTHER): Payer: Medicare HMO | Admitting: Urology

## 2014-02-14 ENCOUNTER — Encounter (HOSPITAL_COMMUNITY): Payer: Self-pay | Admitting: *Deleted

## 2014-02-14 ENCOUNTER — Other Ambulatory Visit: Payer: Self-pay | Admitting: Urology

## 2014-02-14 ENCOUNTER — Inpatient Hospital Stay (HOSPITAL_COMMUNITY)
Admission: RE | Admit: 2014-02-14 | Discharge: 2014-02-17 | DRG: 694 | Disposition: A | Discharge status: Home / Self Care | Payer: Commercial Managed Care - HMO | Source: Ambulatory Visit | Attending: Internal Medicine | Admitting: Internal Medicine

## 2014-02-14 ENCOUNTER — Ambulatory Visit (HOSPITAL_COMMUNITY): Payer: Commercial Managed Care - HMO

## 2014-02-14 ENCOUNTER — Ambulatory Visit (HOSPITAL_COMMUNITY): Payer: Commercial Managed Care - HMO | Admitting: Anesthesiology

## 2014-02-14 ENCOUNTER — Encounter (HOSPITAL_COMMUNITY): Payer: Commercial Managed Care - HMO | Admitting: Anesthesiology

## 2014-02-14 DIAGNOSIS — Z86718 Personal history of other venous thrombosis and embolism: Secondary | ICD-10-CM

## 2014-02-14 DIAGNOSIS — K219 Gastro-esophageal reflux disease without esophagitis: Secondary | ICD-10-CM | POA: Diagnosis present

## 2014-02-14 DIAGNOSIS — Z79899 Other long term (current) drug therapy: Secondary | ICD-10-CM

## 2014-02-14 DIAGNOSIS — N390 Urinary tract infection, site not specified: Secondary | ICD-10-CM | POA: Diagnosis not present

## 2014-02-14 DIAGNOSIS — Z801 Family history of malignant neoplasm of trachea, bronchus and lung: Secondary | ICD-10-CM

## 2014-02-14 DIAGNOSIS — A498 Other bacterial infections of unspecified site: Secondary | ICD-10-CM | POA: Diagnosis present

## 2014-02-14 DIAGNOSIS — Z7982 Long term (current) use of aspirin: Secondary | ICD-10-CM

## 2014-02-14 DIAGNOSIS — N12 Tubulo-interstitial nephritis, not specified as acute or chronic: Secondary | ICD-10-CM | POA: Diagnosis present

## 2014-02-14 DIAGNOSIS — E785 Hyperlipidemia, unspecified: Secondary | ICD-10-CM | POA: Diagnosis present

## 2014-02-14 DIAGNOSIS — I10 Essential (primary) hypertension: Secondary | ICD-10-CM | POA: Diagnosis present

## 2014-02-14 DIAGNOSIS — N200 Calculus of kidney: Secondary | ICD-10-CM | POA: Diagnosis not present

## 2014-02-14 DIAGNOSIS — Z87891 Personal history of nicotine dependence: Secondary | ICD-10-CM

## 2014-02-14 DIAGNOSIS — Z87442 Personal history of urinary calculi: Secondary | ICD-10-CM

## 2014-02-14 DIAGNOSIS — Z8249 Family history of ischemic heart disease and other diseases of the circulatory system: Secondary | ICD-10-CM

## 2014-02-14 DIAGNOSIS — Z808 Family history of malignant neoplasm of other organs or systems: Secondary | ICD-10-CM

## 2014-02-14 DIAGNOSIS — N1330 Unspecified hydronephrosis: Secondary | ICD-10-CM | POA: Diagnosis present

## 2014-02-14 DIAGNOSIS — N10 Acute tubulo-interstitial nephritis: Secondary | ICD-10-CM

## 2014-02-14 DIAGNOSIS — N201 Calculus of ureter: Principal | ICD-10-CM | POA: Diagnosis present

## 2014-02-14 DIAGNOSIS — N179 Acute kidney failure, unspecified: Secondary | ICD-10-CM | POA: Diagnosis present

## 2014-02-14 DIAGNOSIS — J45909 Unspecified asthma, uncomplicated: Secondary | ICD-10-CM | POA: Diagnosis present

## 2014-02-14 DIAGNOSIS — H8110 Benign paroxysmal vertigo, unspecified ear: Secondary | ICD-10-CM | POA: Diagnosis present

## 2014-02-14 DIAGNOSIS — F4310 Post-traumatic stress disorder, unspecified: Secondary | ICD-10-CM | POA: Diagnosis present

## 2014-02-14 HISTORY — DX: Post-traumatic stress disorder, unspecified: F43.10

## 2014-02-14 HISTORY — PX: CYSTOSCOPY/RETROGRADE/URETEROSCOPY/STONE EXTRACTION WITH BASKET: SHX5317

## 2014-02-14 HISTORY — DX: Gastro-esophageal reflux disease without esophagitis: K21.9

## 2014-02-14 SURGERY — CYSTOSCOPY, WITH CALCULUS REMOVAL USING BASKET
Anesthesia: General | Site: Ureter | Laterality: Left

## 2014-02-14 MED ORDER — DIAZEPAM 2 MG PO TABS
2.0000 mg | ORAL_TABLET | Freq: Three times a day (TID) | ORAL | Status: DC | PRN
  Administered 2014-02-15 – 2014-02-16 (×2): 2 mg via ORAL
  Filled 2014-02-14 (×2): qty 1

## 2014-02-14 MED ORDER — SODIUM CHLORIDE 0.9 % IV SOLN
INTRAVENOUS | Status: DC
  Administered 2014-02-14 – 2014-02-17 (×8): via INTRAVENOUS

## 2014-02-14 MED ORDER — PROPOFOL 10 MG/ML IV BOLUS
INTRAVENOUS | Status: DC | PRN
  Administered 2014-02-14: 170 mg via INTRAVENOUS
  Administered 2014-02-14: 30 mg via INTRAVENOUS

## 2014-02-14 MED ORDER — FENTANYL CITRATE 0.05 MG/ML IJ SOLN: 25.0000 ug | INTRAMUSCULAR | Status: DC | PRN

## 2014-02-14 MED ORDER — OXYBUTYNIN CHLORIDE 5 MG PO TABS: 5.0000 mg | ORAL_TABLET | Freq: Three times a day (TID) | ORAL | Status: DC | PRN

## 2014-02-14 MED ORDER — CEFAZOLIN SODIUM 1-5 GM-% IV SOLN
1.0000 g | Freq: Three times a day (TID) | INTRAVENOUS | Status: DC
  Administered 2014-02-14 – 2014-02-17 (×10): 1 g via INTRAVENOUS
  Filled 2014-02-14 (×19): qty 50

## 2014-02-14 MED ORDER — OXYBUTYNIN CHLORIDE 5 MG PO TABS: 5.0000 mg | ORAL_TABLET | Freq: Three times a day (TID) | ORAL | Status: DC

## 2014-02-14 MED ORDER — SODIUM CHLORIDE 0.9 % IR SOLN
Status: DC | PRN
  Administered 2014-02-14: 3000 mL

## 2014-02-14 MED ORDER — ENOXAPARIN SODIUM 40 MG/0.4ML ~~LOC~~ SOLN
40.0000 mg | SUBCUTANEOUS | Status: DC
  Administered 2014-02-14 – 2014-02-17 (×4): 40 mg via SUBCUTANEOUS
  Filled 2014-02-14 (×4): qty 0.4

## 2014-02-14 MED ORDER — FENTANYL CITRATE 0.05 MG/ML IJ SOLN
25.0000 ug | INTRAMUSCULAR | Status: DC
  Administered 2014-02-14: 25 ug via INTRAVENOUS
  Filled 2014-02-14: qty 2

## 2014-02-14 MED ORDER — MIDAZOLAM HCL 2 MG/2ML IJ SOLN
1.0000 mg | INTRAMUSCULAR | Status: DC | PRN
  Administered 2014-02-14: 2 mg via INTRAVENOUS
  Filled 2014-02-14: qty 2

## 2014-02-14 MED ORDER — ALBUTEROL SULFATE HFA 108 (90 BASE) MCG/ACT IN AERS
INHALATION_SPRAY | RESPIRATORY_TRACT | Status: DC | PRN
  Administered 2014-02-14: 2 via RESPIRATORY_TRACT

## 2014-02-14 MED ORDER — ONDANSETRON 4 MG PO TBDP: 4.0000 mg | ORAL_TABLET | Freq: Three times a day (TID) | ORAL | Status: DC | PRN

## 2014-02-14 MED ORDER — FENTANYL CITRATE 0.05 MG/ML IJ SOLN
INTRAMUSCULAR | Status: AC
  Filled 2014-02-14: qty 2

## 2014-02-14 MED ORDER — HYDROMORPHONE HCL PF 1 MG/ML IJ SOLN: 1.0000 mg | INTRAMUSCULAR | Status: DC | PRN

## 2014-02-14 MED ORDER — FENTANYL CITRATE 0.05 MG/ML IJ SOLN
INTRAMUSCULAR | Status: DC | PRN
  Administered 2014-02-14: 50 ug via INTRAVENOUS
  Administered 2014-02-14 (×2): 25 ug via INTRAVENOUS

## 2014-02-14 MED ORDER — LIDOCAINE HCL (PF) 1 % IJ SOLN
INTRAMUSCULAR | Status: AC
  Filled 2014-02-14: qty 5

## 2014-02-14 MED ORDER — ACETAMINOPHEN 500 MG PO TABS
500.0000 mg | ORAL_TABLET | Freq: Four times a day (QID) | ORAL | Status: DC | PRN
  Administered 2014-02-15 – 2014-02-16 (×3): 500 mg via ORAL
  Filled 2014-02-14 (×4): qty 1

## 2014-02-14 MED ORDER — STERILE WATER FOR IRRIGATION IR SOLN
Status: DC | PRN
  Administered 2014-02-14: 1000 mL

## 2014-02-14 MED ORDER — ASPIRIN EC 81 MG PO TBEC
81.0000 mg | DELAYED_RELEASE_TABLET | Freq: Every morning | ORAL | Status: DC
  Administered 2014-02-15 – 2014-02-17 (×3): 81 mg via ORAL
  Filled 2014-02-14 (×3): qty 1

## 2014-02-14 MED ORDER — ALBUTEROL SULFATE HFA 108 (90 BASE) MCG/ACT IN AERS: 2.0000 | INHALATION_SPRAY | Freq: Once | RESPIRATORY_TRACT | Status: DC

## 2014-02-14 MED ORDER — LOSARTAN POTASSIUM 25 MG PO TABS
12.5000 mg | ORAL_TABLET | Freq: Every morning | ORAL | Status: DC
  Administered 2014-02-15: 12.5 mg via ORAL
  Filled 2014-02-14 (×3): qty 0.5

## 2014-02-14 MED ORDER — GABAPENTIN 400 MG PO CAPS
800.0000 mg | ORAL_CAPSULE | Freq: Three times a day (TID) | ORAL | Status: DC
  Administered 2014-02-14 – 2014-02-17 (×10): 800 mg via ORAL
  Filled 2014-02-14 (×10): qty 2

## 2014-02-14 MED ORDER — LACTATED RINGERS IV SOLN
INTRAVENOUS | Status: DC
  Administered 2014-02-14: 1000 mL via INTRAVENOUS

## 2014-02-14 MED ORDER — ONDANSETRON HCL 4 MG/2ML IJ SOLN: 4.0000 mg | INTRAMUSCULAR | Status: DC | PRN

## 2014-02-14 MED ORDER — MECLIZINE HCL 12.5 MG PO TABS
25.0000 mg | ORAL_TABLET | Freq: Three times a day (TID) | ORAL | Status: DC | PRN
  Administered 2014-02-15 – 2014-02-16 (×2): 25 mg via ORAL
  Filled 2014-02-14 (×3): qty 2

## 2014-02-14 MED ORDER — LIDOCAINE HCL (CARDIAC) 10 MG/ML IV SOLN
INTRAVENOUS | Status: DC | PRN
  Administered 2014-02-14: 1 mg via INTRAVENOUS

## 2014-02-14 MED ORDER — SIMVASTATIN 20 MG PO TABS
40.0000 mg | ORAL_TABLET | Freq: Every evening | ORAL | Status: DC
  Administered 2014-02-14 – 2014-02-17 (×4): 40 mg via ORAL
  Filled 2014-02-14 (×4): qty 2

## 2014-02-14 MED ORDER — PROPOFOL 10 MG/ML IV BOLUS
INTRAVENOUS | Status: AC
  Filled 2014-02-14: qty 20

## 2014-02-14 MED ORDER — ONDANSETRON HCL 4 MG/2ML IJ SOLN
4.0000 mg | Freq: Once | INTRAMUSCULAR | Status: AC
  Administered 2014-02-14: 4 mg via INTRAVENOUS
  Filled 2014-02-14: qty 2

## 2014-02-14 MED ORDER — DEXTROSE 5 % IV SOLN
5.0000 mg/kg | INTRAVENOUS | Status: AC
  Administered 2014-02-14: 470 mg via INTRAVENOUS
  Filled 2014-02-14: qty 11.75

## 2014-02-14 MED ORDER — PANTOPRAZOLE SODIUM 40 MG PO TBEC
40.0000 mg | DELAYED_RELEASE_TABLET | Freq: Every day | ORAL | Status: DC
  Administered 2014-02-14 – 2014-02-17 (×4): 40 mg via ORAL
  Filled 2014-02-14 (×4): qty 1

## 2014-02-14 MED ORDER — SODIUM CHLORIDE 0.45 % IV SOLN
INTRAVENOUS | Status: DC
  Administered 2014-02-14: 16:00:00 via INTRAVENOUS

## 2014-02-14 MED ORDER — ALBUTEROL SULFATE HFA 108 (90 BASE) MCG/ACT IN AERS
INHALATION_SPRAY | RESPIRATORY_TRACT | Status: AC
  Filled 2014-02-14: qty 6.7

## 2014-02-14 MED ORDER — DOCUSATE SODIUM 100 MG PO CAPS
100.0000 mg | ORAL_CAPSULE | Freq: Two times a day (BID) | ORAL | Status: DC
  Administered 2014-02-14 – 2014-02-17 (×7): 100 mg via ORAL
  Filled 2014-02-14 (×7): qty 1

## 2014-02-14 MED ORDER — ONDANSETRON HCL 4 MG/2ML IJ SOLN: 4.0000 mg | Freq: Once | INTRAMUSCULAR | Status: DC | PRN

## 2014-02-14 SURGICAL SUPPLY — 16 items
BASKET ZERO TIP NITINOL 2.4FR (BASKET) ×3 IMPLANT
BSKT STON RTRVL ZERO TP 2.4FR (BASKET) ×2
CLOTH BEACON ORANGE TIMEOUT ST (SAFETY) ×6 IMPLANT
GLOVE BIOGEL PI IND STRL 7.5 (GLOVE) ×1 IMPLANT
GLOVE BIOGEL PI INDICATOR 7.5 (GLOVE) ×2
GLOVE EXAM NITRILE LRG STRL (GLOVE) ×3 IMPLANT
GLOVE SURG SS PI 7.5 STRL IVOR (GLOVE) ×3 IMPLANT
GOWN STRL NON-REIN LRG LVL3 (GOWN DISPOSABLE) ×3 IMPLANT
GOWN STRL REUS W/TWL XL LVL3 (GOWN DISPOSABLE) ×3 IMPLANT
GUIDEWIRE STR DUAL SENSOR (WIRE) ×3 IMPLANT
IV NS IRRIG 3000ML ARTHROMATIC (IV SOLUTION) ×3 IMPLANT
MANIFOLD NEPTUNE II (INSTRUMENTS) ×3 IMPLANT
PACK CYSTO (CUSTOM PROCEDURE TRAY) ×3 IMPLANT
SHEATH URET ACCESS 12FR/35CM (UROLOGICAL SUPPLIES) ×3 IMPLANT
STENT CONTOUR 6FRX24X.038 (STENTS) ×3 IMPLANT
WATER STERILE IRR 1000ML POUR (IV SOLUTION) ×3 IMPLANT

## 2014-02-14 NOTE — Op Note (Signed)
Preoperative diagnosis:Infected, obstructing left distal ureteral stone2  Postoperative diagnosis:Same   Procedure:Cystoscopy, dilation of left distal ureter, attempted basket extraction of left distal ureteral stone, double-J stent placement-6 [place] by 24 cm contour without string    Surgeon: Lillette Boxer. Kelan Pritt, M.D.   Anesthesia: Gen.   Complications:None  Specimen(s):None  Drain(s):24 cm a 6 [place] contour double-J stent without string  Indications:68 year old male with an infected, obstructing left distal ureteral stone.  He presents at this time for urgent drainage of this infected left collecting system    Technique and findings:The patient was properly identified in the holding area.  He had been marked previously.  He received preoperative IV gentamicin.  He was taken to the operating room where general anesthetic was administered with the LMA.  He was placed in the dorsolithotomy position.  Genitalia and perineum were prepped and draped.  A proper timeout was performed.  A 22 French panendoscope was advanced through his urethra and into his bladder.  There was a mild bulbous urethral stricture which was easily passed with the beak of the scope.  The bladder was inspected circumferentially.  There was mild generalized erythema.  No lesions were noted.  No trabeculations or foreign bodies were present.  The left distal ureter was cannulated with a sensor tip guidewire, and fluoroscopic guidance identified the proximal tip in the left renal pelvis with a curl seen.  I then dilated the patient's distal ureter with the inner core of a 12/14 ureteral access catheter.  Following this, a large amount of pus came out of the patient's left ureter.  After decompression, I then passed a Nitinol basket several times up into the left ureter, hoping to engage the stone.  I was unable to do this.  After proximally 5 passes of the basket, I then, using fluoroscopic and cystoscopic guidance, passed  the previously mentioned stent.  It was adequately positioned and checked after the guidewire was removed, with good proximal and distal curls.  The bladder was drained and the procedure terminated.  The patient was awakened and taken to the PACU in stable condition.  He tolerated the procedure well.

## 2014-02-14 NOTE — Transfer of Care (Addendum)
Immediate Anesthesia Transfer of Care Note  Patient: Andrew Bates  Procedure(s) Performed: Procedure(s) (LRB): CYSTOSCOPY AND LEFT DOUBLE J STENT PLACEMENT;  ATTEMPTED STONE  EXTRACTION  (Left)  Patient Location: PACU  Anesthesia Type: General  Level of Consciousness: awake  Airway & Oxygen Therapy: Patient Spontanous Breathing   Post-op Assessment: Report given to PACU RN, Post -op Vital signs reviewed and stable and Patient moving all extremities  Post vital signs: Reviewed and stable  Complications: No apparent anesthesia complications

## 2014-02-14 NOTE — Anesthesia Procedure Notes (Signed)
Procedure Name: LMA Insertion Date/Time: 02/14/2014 1:13 PM Performed by: Vista Deck Pre-anesthesia Checklist: Patient identified, Patient being monitored, Emergency Drugs available, Timeout performed and Suction available Patient Re-evaluated:Patient Re-evaluated prior to inductionOxygen Delivery Method: Circle System Utilized Preoxygenation: Pre-oxygenation with 100% oxygen Intubation Type: IV induction Ventilation: Mask ventilation without difficulty LMA: LMA inserted LMA Size: 4.0 Number of attempts: 1 Placement Confirmation: positive ETCO2 and breath sounds checked- equal and bilateral Tube secured with: Tape

## 2014-02-14 NOTE — Anesthesia Postprocedure Evaluation (Signed)
  Anesthesia Post-op Note  Patient: Andrew Bates  Procedure(s) Performed: Procedure(s): CYSTOSCOPY AND LEFT DOUBLE J STENT PLACEMENT;  ATTEMPTED STONE  EXTRACTION  (Left)  Patient Location: PACU  Anesthesia Type:General  Level of Consciousness: awake and patient cooperative  Airway and Oxygen Therapy: Patient Spontanous Breathing  Post-op Pain: none  Post-op Assessment: Post-op Vital signs reviewed, Patient's Cardiovascular Status Stable and Respiratory Function Stable  Post-op Vital Signs: Reviewed and stable   Complications: No apparent anesthesia complications

## 2014-02-14 NOTE — Anesthesia Preprocedure Evaluation (Addendum)
Anesthesia Evaluation  Patient identified by MRN, date of birth, ID band Patient awake    Reviewed: Allergy & Precautions, H&P , NPO status , Patient's Chart, lab work & pertinent test results  Airway Mallampati: II TM Distance: >3 FB     Dental  (+) Implants   Pulmonary asthma , former smoker,  breath sounds clear to auscultation        Cardiovascular hypertension, Pt. on medications DVT (resolved) Rhythm:Regular Rate:Normal     Neuro/Psych  Neuromuscular disease    GI/Hepatic GERD-  Controlled and Medicated,  Endo/Other    Renal/GU Renal disease     Musculoskeletal   Abdominal   Peds  Hematology   Anesthesia Other Findings   Reproductive/Obstetrics                          Anesthesia Physical Anesthesia Plan  ASA: III  Anesthesia Plan: General   Post-op Pain Management:    Induction: Intravenous  Airway Management Planned: LMA  Additional Equipment:   Intra-op Plan:   Post-operative Plan: Extubation in OR  Informed Consent: I have reviewed the patients History and Physical, chart, labs and discussed the procedure including the risks, benefits and alternatives for the proposed anesthesia with the patient or authorized representative who has indicated his/her understanding and acceptance.     Plan Discussed with:   Anesthesia Plan Comments:         Anesthesia Quick Evaluation

## 2014-02-14 NOTE — H&P (Signed)
Urology History and Physical Exam  CC: kidney stone  HPI: 68 year old male is admitted for an operative procedure to manage a left distal ureteral stone with associated infection.  He became symptomatic about 2 days ago and presented to the emergency room here in [place].  Evaluation included a urinalysis which revealed pyuria as well as a CT scan which revealed a 3-1/2 mm left ureterovesical calculus with hydronephrosis.  He was sent home on Flomax and cephalexin.  Culture is pending.  He was seen in follow-up in our office yesterday in [place], and was actually doing fairly well.  He developed fevers overnight up to 101.2, shakes, chills and is not feeling well.  He still has left flank pain.  He was seen this morning in our [place] office, felt to need urgent decompression of his left renal unit.  PMH: Past Medical History  Diagnosis Date  . Adenomatous colon polyp   . Internal hemorrhoids   . Hypertension   . Hyperlipidemia   . Vertigo   . DVT (deep venous thrombosis) 2008  . Asthma   . Kidney stone   . Peripheral neuropathy     PSH: Past Surgical History  Procedure Laterality Date  . Right knee surgery  2006  . Carpal tunnel release      Allergies: Allergies  Allergen Reactions  . Codeine Nausea And Vomiting    REACTION: Headache  . Morphine And Related Nausea And Vomiting  . Other Nausea And Vomiting and Other (See Comments)    "STRONG" PAIN MEDICATIONS IN GENERAL: HEADACHE  . Pentazocine Nausea And Vomiting    Headaches    Medications:  (Not in a hospital admission)   Social History: History   Social History  . Marital Status: Married    Spouse Name: [name]    Number of Children: 2  . Years of Education: N/A   Occupational History  . retired/diabled    Social History Main Topics  . Smoking status: Former Smoker    Types: Cigars  . Smokeless tobacco: Never Used  . Alcohol Use: No  . Drug Use: No  . Sexual Activity: Not on file   Other  Topics Concern  . Not on file   Social History Narrative  . No narrative on file    Family History: Family History  Problem Relation Age of Onset  . Hypertension Father   . Hyperlipidemia Father   . Lung cancer Father   . Thyroid cancer Mother     Review of Systems: Positive: fevers, chills, nausea, left flank and back pain. Negative:  A further 10 point review of systems was negative except what is listed in the HPI.                  Physical Exam: There were no vitals filed for this visit. General: mildacute distress.  Awake. Head:  Normocephalic.  Atraumatic. ENT:  EOMI.  Mucous membranes moist Neck:  Supple.  No lymphadenopathy. CV:  S1 present. S2 present. Regular rate. Pulmonary: Equal effort bilaterally.  Clear to auscultation bilaterally. Abdomen: Soft.  Left lower quadrant tenderness, moderate.  Left CVA tenderness, moderate Skin:  Normal turgor.  No visible rash. Extremity: No gross deformity of bilateral upper extremities.  No gross deformity of                             lower extremities. Neurologic: Alert. Appropriate mood.    Studies:  Recent  Labs     02/12/14  2320  HGB  15.9  WBC  13.6*  PLT  215    Recent Labs     02/12/14  2320  NA  139  K  4.7  CL  101  CO2  25  BUN  18  CREATININE  1.77*  CALCIUM  9.3  GFRNONAA  38*  GFRAA  44*     No results found for this basename: PT, INR, APTT,  in the last 72 hours   No components found with this basename: ABG,   I reviewed the patient's CT scan images from 2 days ago with he and his wife.  Urinalysis revealed mild pyuria.  Urine culture is pending  Assessment:  Infected, obstructing left distal ureteral stone  Plan: Urgent cystoscopy, left double-J stent placement.  I may try, without ureteroscopy, to perform basket extraction of the stone.  He will need to be admitted to the hospital for IV hydration and antibiotics following the procedure.  I let the patient know that I will not be in  [place] tomorrow, and I will have a hospitalist see the patient until his discharge.

## 2014-02-14 NOTE — Consult Note (Addendum)
[facility] Consultation  Andrew Bates PHX:[id number] DOB: 1946/04/29 DOA: 02/14/2014 PCP: Purvis Kilts, MD   Requesting physician: Dr. Mar Daring Date of consultation: 02/14/14 Reason for consultation: Medical management postoperatively from infected obstructing left distal ureteral stone.  Impression/Recommendations Active Problems:   1. Calculus of ureter, with infected stone. 2. Acute renal failure.    1. Intravenous antibiotics. 2. IV fluids for acute renal failure. 3. Will discuss with urology tomorrow regarding discharge planning.  I will followup again tomorrow. Please contact me if I can be of assistance in the meanwhile. Thank you for this consultation.  Chief Complaint: This 68 year old man was admitted today for urgent cystoscopy, left double-J stent placement. It appears that he had infected left distal ureteral stone. Status post cystoscopy, dilatation of the left distal ureter unsuccessful attempt at basket extraction of left distal ureteral stone and then involving double-J placement of a stent. He currently feels well and has been on the left loin area is improved.    Review of Systems:  Above from the above symptoms, review of all other systems is negative.  Past Medical History  Diagnosis Date  . Adenomatous colon polyp   . Internal hemorrhoids   . Hypertension   . Hyperlipidemia   . Vertigo   . DVT (deep venous thrombosis) 2008  . Asthma   . Kidney stone   . Peripheral neuropathy   . GERD (gastroesophageal reflux disease)   . Post traumatic stress disorder (PTSD)    Past Surgical History  Procedure Laterality Date  . Right knee surgery  2006  . Carpal tunnel release     Social History:  reports that he has quit smoking. His smoking use included Cigars. He has never used smokeless tobacco. He reports that he does not drink alcohol or use illicit drugs.  Allergies  Allergen Reactions  . Codeine Nausea And Vomiting    REACTION:  Headache  . Morphine And Related Nausea And Vomiting  . Other Nausea And Vomiting and Other (See Comments)    "STRONG" PAIN MEDICATIONS IN GENERAL: HEADACHE  . Pentazocine Nausea And Vomiting    Headaches   Family History  Problem Relation Age of Onset  . Hypertension Father   . Hyperlipidemia Father   . Lung cancer Father   . Thyroid cancer Mother     Prior to Admission medications   Medication Sig Start Date End Date Taking? Authorizing Provider  acetaminophen (TYLENOL) 500 MG tablet Take 500 mg by mouth every 6 (six) hours as needed for mild pain or moderate pain.   Yes Historical Provider, MD  aspirin EC 81 MG tablet Take 81 mg by mouth every morning.   Yes Historical Provider, MD  cephALEXin (KEFLEX) 500 MG capsule Take 1 capsule (500 mg total) by mouth 4 (four) times daily. 02/13/14  Yes Johnna Acosta, MD  diazepam (VALIUM) 2 MG tablet Take 2 mg by mouth every 8 (eight) hours as needed (for vertigo).    Yes Historical Provider, MD  gabapentin (NEURONTIN) 400 MG capsule Take 800 mg by mouth 3 (three) times daily.    Yes Historical Provider, MD  HYDROcodone-acetaminophen (NORCO) 5-325 MG per tablet Take 1-2 tablets by mouth every 4 (four) hours as needed. 02/13/14  Yes Mariea Clonts, MD  losartan (COZAAR) 25 MG tablet Take 12.5 mg by mouth every morning.    Yes Historical Provider, MD  meclizine (ANTIVERT) 25 MG tablet Take 25 mg by mouth every 8 (eight) hours as needed for  dizziness.   Yes Historical Provider, MD  omeprazole (PRILOSEC) 20 MG capsule Take 20 mg by mouth every morning.    Yes Historical Provider, MD  ondansetron (ZOFRAN ODT) 4 MG disintegrating tablet Take 1 tablet (4 mg total) by mouth every 8 (eight) hours as needed for nausea. 02/13/14  Yes Johnna Acosta, MD  simvastatin (ZOCOR) 40 MG tablet Take 40 mg by mouth every evening.   Yes Historical Provider, MD  oxybutynin (DITROPAN) 5 MG tablet Take 1 tablet (5 mg total) by mouth 3 (three) times daily. 02/14/14   Jorja Loa, MD  tamsulosin (FLOMAX) 0.4 MG CAPS capsule Take 1 capsule (0.4 mg total) by mouth daily after supper. For your prostate. 11/18/13   Rexene Alberts, MD   Physical Exam: Blood pressure 115/63, pulse 53, temperature 97.8 F (36.6 C), temperature source Oral, resp. rate 16, height 6\' 2"  (1.88 m), weight 96.344 kg (212 lb 6.4 oz), SpO2 100.00%. Filed Vitals:   02/14/14 1602  BP: 115/63  Pulse: 53  Temp: 97.8 F (36.6 C)  Resp: 16     General:  He looks systemically well. Is not toxic or septic.  Eyes: Within normal limits.  ENT: Within normal limits.  Neck: No lymphadenopathy.  Cardiovascular: Heart sounds are present without murmurs or added sounds.  Respiratory: Lung fields are clear.  Abdomen: Soft, slightly tender in the left loin area. No rebound tenderness.  Skin: No worrisome skin lesions.  Musculoskeletal: No acute joint abnormalities.  Psychiatric: Appropriate affect.  Neurologic: Alert and orientated without any focal neurological signs.  Labs on Admission:  Basic Metabolic Panel:  Recent Labs Lab 02/12/14 2320  NA 139  K 4.7  CL 101  CO2 25  GLUCOSE 109*  BUN 18  CREATININE 1.77*  CALCIUM 9.3   Liver Function Tests: No results found for this basename: AST, ALT, ALKPHOS, BILITOT, PROT, ALBUMIN,  in the last 168 hours No results found for this basename: LIPASE, AMYLASE,  in the last 168 hours No results found for this basename: AMMONIA,  in the last 168 hours CBC:  Recent Labs Lab 02/12/14 2320  WBC 13.6*  NEUTROABS 11.5*  HGB 15.9  HCT 45.4  MCV 95.0  PLT 215   Cardiac Enzymes: No results found for this basename: CKTOTAL, CKMB, CKMBINDEX, TROPONINI,  in the last 168 hours BNP: No components found with this basename: POCBNP,  CBG: No results found for this basename: GLUCAP,  in the last 168 hours  Radiological Exams on Admission: Ct Abdomen Pelvis Wo Contrast  02/13/2014   CLINICAL DATA:  Left flank pain  EXAM: CT ABDOMEN  AND PELVIS WITHOUT CONTRAST  TECHNIQUE: Multidetector CT imaging of the abdomen and pelvis was performed following the standard protocol without IV contrast.  COMPARISON:  None available  FINDINGS: The visualized lung bases are clear.  Limited noncontrast evaluation of the liver is unremarkable. Gallbladder within normal limits. The spleen, adrenal glands, and pancreas demonstrate a normal unenhanced appearance.  Scattered punctate hyperdensities within the right kidney likely reflect small nonobstructive calculi. No stones seen along the course of the right renal collecting system. There is no right-sided obstructive uropathy.  On the left, there is an obstructive 4 mm calculus within the distal left ureter just proximal to the left UVJ there is secondary moderate left hydroureteronephrosis with extensive left periureteral and perinephric fat stranding. Additional tiny punctate nonobstructive calculi present within the mid left kidney.  Stomach within normal limits. No evidence of bowel obstruction. No  abnormal wall thickening or inflammatory fat stranding seen about the bowels.  Bladder within normal limits.  Prostate are unremarkable.  No free air or fluid. No adenopathy. Mild to moderate calcified atheromatous disease present within the infrarenal aorta. No intra-abdominal aneurysm.  No acute osseous abnormality. No worrisome lytic or blastic osseous lesion. Chronic anterior wedging deformity of the L1 vertebral body noted.  IMPRESSION: 1. 4 mm obstructive calculus within the distal left ureter with secondary moderate left hydroureteronephrosis and extensive left perinephric/periureteral fat stranding. 2. Additional punctate nonobstructive calculi within the bilateral kidneys.   Electronically Signed   By: Jeannine Boga M.D.   On: 02/13/2014 00:44     Time spent: 60 minutes.  Doree Albee [facility] Pager [phone].  If 7PM-7AM, please contact night-coverage [email or web address] Password  [facility] 02/14/2014, 5:18 PM

## 2014-02-14 NOTE — Discharge Instructions (Signed)

## 2014-02-15 DIAGNOSIS — N10 Acute tubulo-interstitial nephritis: Secondary | ICD-10-CM

## 2014-02-15 DIAGNOSIS — N1330 Unspecified hydronephrosis: Secondary | ICD-10-CM | POA: Diagnosis present

## 2014-02-15 DIAGNOSIS — N179 Acute kidney failure, unspecified: Secondary | ICD-10-CM | POA: Diagnosis present

## 2014-02-15 LAB — COMPREHENSIVE METABOLIC PANEL
ALBUMIN: 2.8 g/dL — AB (ref 3.5–5.2)
ALT: 29 U/L (ref 0–53)
ANION GAP: 9 (ref 5–15)
AST: 32 U/L (ref 0–37)
Alkaline Phosphatase: 137 U/L — ABNORMAL HIGH (ref 39–117)
BUN: 24 mg/dL — ABNORMAL HIGH (ref 6–23)
CALCIUM: 7.9 mg/dL — AB (ref 8.4–10.5)
CHLORIDE: 103 meq/L (ref 96–112)
CO2: 25 mEq/L (ref 19–32)
CREATININE: 1.86 mg/dL — AB (ref 0.50–1.35)
GFR calc Af Amer: 42 mL/min — ABNORMAL LOW (ref >90)
GFR calc non Af Amer: 36 mL/min — ABNORMAL LOW (ref >90)
Glucose, Bld: 109 mg/dL — ABNORMAL HIGH (ref 70–99)
Potassium: 4.5 mEq/L (ref 3.7–5.3)
Sodium: 137 mEq/L (ref 137–147)
Total Bilirubin: 0.6 mg/dL (ref 0.3–1.2)
Total Protein: 5.9 g/dL — ABNORMAL LOW (ref 6.0–8.3)

## 2014-02-15 LAB — URINE CULTURE: Colony Count: 80000

## 2014-02-15 LAB — CBC
HEMATOCRIT: 39.7 % (ref 39.0–52.0)
Hemoglobin: 13.2 g/dL (ref 13.0–17.0)
MCH: 31.7 pg (ref 26.0–34.0)
MCHC: 33.2 g/dL (ref 30.0–36.0)
MCV: 95.2 fL (ref 78.0–100.0)
Platelets: 173 10*3/uL (ref 150–400)
RBC: 4.17 MIL/uL — ABNORMAL LOW (ref 4.22–5.81)
RDW: 13 % (ref 11.5–15.5)
WBC: 5.7 10*3/uL (ref 4.0–10.5)

## 2014-02-15 NOTE — Progress Notes (Signed)
UR completed 

## 2014-02-15 NOTE — Progress Notes (Signed)
ANTIBIOTIC CONSULT NOTE - INITIAL  Pharmacy Consult for Renal Adjustment of ABX (cefazolin) Indication: UTI  Allergies  Allergen Reactions  . Codeine Nausea And Vomiting    REACTION: Headache  . Morphine And Related Nausea And Vomiting  . Other Nausea And Vomiting and Other (See Comments)    "STRONG" PAIN MEDICATIONS IN GENERAL: HEADACHE  . Pentazocine Nausea And Vomiting    Headaches   Patient Measurements: Height: 6\' 2"  (188 cm) Weight: 212 lb 6.4 oz (96.344 kg) IBW/kg (Calculated) : 82.2  Vital Signs: Temp: 100.2 F (37.9 C) (08/26 0545) Temp src: Oral (08/26 0545) BP: 118/56 mmHg (08/26 0545) Pulse Rate: 60 (08/26 0545) Intake/Output from previous day: 08/25 0701 - 08/26 0700 In: 2364.6 [I.V.:2214.6; IV Piggyback:150] Out: 650 [Urine:650] Intake/Output from this shift:    Labs:  Recent Labs  02/12/14 2320 02/15/14 0534  WBC 13.6* 5.7  HGB 15.9 13.2  PLT 215 173  CREATININE 1.77* 1.86*   Estimated Creatinine Clearance: 44.8 ml/min (by C-G formula based on Cr of 1.86). No results found for this basename: VANCOTROUGH, Corlis Leak, VANCORANDOM, GENTTROUGH, GENTPEAK, GENTRANDOM, TOBRATROUGH, TOBRAPEAK, TOBRARND, AMIKACINPEAK, AMIKACINTROU, AMIKACIN,  in the last 72 hours   Microbiology: Recent Results (from the past 720 hour(s))  URINE CULTURE     Status: None   Collection Time    02/12/14 10:00 PM      Result Value Ref Range Status   Specimen Description URINE, CATHETERIZED   Final   Special Requests NONE   Final   Culture  Setup Time     Final   Value: 02/13/2014 10:30     Performed at [place]     Final   Value: 80,000 COLONIES/ML     Performed at [facility]   Culture     Final   Value: ESCHERICHIA COLI     Performed at [facility]   Report Status 02/15/2014 FINAL   Final   Organism ID, Bacteria ESCHERICHIA COLI   Final   Medical History: Past Medical History  Diagnosis Date  . Adenomatous colon polyp    . Internal hemorrhoids   . Hypertension   . Hyperlipidemia   . Vertigo   . DVT (deep venous thrombosis) 2008  . Asthma   . Kidney stone   . Peripheral neuropathy   . GERD (gastroesophageal reflux disease)   . Post traumatic stress disorder (PTSD)    Cefazolin 8/25>>  Assessment: 68yo male admitted for operative procedure to manage a left distal ureteral stone with associated infection.  Pt was started on IV Cefazolin 1gm q8h.  SCr is elevated.  Estimated Creatinine Clearance: 44.8 ml/min (by C-G formula based on Cr of 1.86).  Despite current ClCr cefazolin dose is appropriate.  Urine culture with E Coli that is sensitive to cefazolin.   Report Status 02/15/2014 FINAL     Organism ID, Bacteria ESCHERICHIA COLI     Resulting Agency [facility]    Culture & Susceptibility     Antibiotic  Organism Organism Organism      ESCHERICHIA COLI      AMPICILLIN  >=32 RESISTANT R Final        CEFAZOLIN  <=4 SENSITIVE S Final        CEFTRIAXONE  <=1 SENSITIVE S Final        CIPROFLOXACIN  >=4 RESISTANT R Final        GENTAMICIN  <=1 SENSITIVE S Final        LEVOFLOXACIN  >=  8 RESISTANT R Final        NITROFURANTOIN  <=16 SENSITIVE S Final        PIP/TAZO  <=4 SENSITIVE S Final        TOBRAMYCIN  <=1 SENSITIVE S Final        TRIMETH/SULFA  <=20 SENSITIVE S Final        Goal of Therapy:  Eradicate infection.  Plan:  Continue Cefazolin 1gm IV q8h Monitor SCr closely, adjust dosing if needed Deescalate to PO antibiotic when appropriate.  [place] Crane, Dellene Mcgroarty A 02/15/2014,11:24 AM

## 2014-02-15 NOTE — Progress Notes (Signed)
[facility] PROGRESS NOTE  Andrew Bates YHC:[id number] DOB: [birth year] DOA: 02/14/2014 PCP: Purvis Kilts, MD  Assessment/Plan: 1. Calculus of ureter, with infected stone. S/P cystoscopy with stent 02/14/14 due to infected obstructing stone per chart. Reports feeling better than yesterday but still "no good". Continues with pain/nausea as well but improved. Leukocytosis resolved. Max temp 100.2. Complains of not feeling as though emptying bladder. Will continue Ancef day #2. Urology note dated today yields  2. Acute renal failure. Likely related to obstructive infected stone as above. Will hold nephrotoxins. Monitor urine output. Check post void residual.  3. HTN: home meds include cozaar. Will hold for now given #2. BP currently controlled.     Code Status: full Family Communication:  Disposition Plan: home hopefully tomorro   Consultants:  urology  Procedures:  none  Antibiotics:  Ancef 02/14/14>>  HPI/Subjective: Sitting up in bed. Reports some improvement but continues with pain/nausea.   Objective: Filed Vitals:   02/15/14 1407  BP: 126/68  Pulse: 55  Temp: 100 F (37.8 C)  Resp: 20    Intake/Output Summary (Last 24 hours) at 02/15/14 1521 Last data filed at 02/15/14 1439  Gross per 24 hour  Intake 2764.58 ml  Output   1000 ml  Net 1764.58 ml   Filed Weights   02/14/14 1500  Weight: 96.344 kg (212 lb 6.4 oz)    Exam:   General:  Well nourished  Cardiovascular: RRR No MGR No LE edema  Respiratory: normal effort BS clear to auscultation bilaterally  Abdomen: round slightly firm BS sluggish non-tender   Musculoskeletal: no clubbing or cyanosis   Data Reviewed: Basic Metabolic Panel:  Recent Labs Lab 02/12/14 2320 02/15/14 0534  NA 139 137  K 4.7 4.5  CL 101 103  CO2 25 25  GLUCOSE 109* 109*  BUN 18 24*  CREATININE 1.77* 1.86*  CALCIUM 9.3 7.9*   Liver Function Tests:  Recent Labs Lab 02/15/14 0534  AST 32  ALT 29   ALKPHOS 137*  BILITOT 0.6  PROT 5.9*  ALBUMIN 2.8*   No results found for this basename: LIPASE, AMYLASE,  in the last 168 hours No results found for this basename: AMMONIA,  in the last 168 hours CBC:  Recent Labs Lab 02/12/14 2320 02/15/14 0534  WBC 13.6* 5.7  NEUTROABS 11.5*  --   HGB 15.9 13.2  HCT 45.4 39.7  MCV 95.0 95.2  PLT 215 173   Cardiac Enzymes: No results found for this basename: CKTOTAL, CKMB, CKMBINDEX, TROPONINI,  in the last 168 hours BNP (last 3 results) No results found for this basename: PROBNP,  in the last 8760 hours CBG: No results found for this basename: GLUCAP,  in the last 168 hours  Recent Results (from the past 240 hour(s))  URINE CULTURE     Status: None   Collection Time    02/12/14 10:00 PM      Result Value Ref Range Status   Specimen Description URINE, CATHETERIZED   Final   Special Requests NONE   Final   Culture  Setup Time     Final   Value: 02/13/2014 10:30     Performed at [facility]     Final   Value: 80,000 COLONIES/ML     Performed at [facility]   Culture     Final   Value: ESCHERICHIA COLI     Performed at [facility]   Report Status 02/15/2014 FINAL  Final   Organism ID, Bacteria ESCHERICHIA COLI   Final     Studies: Dg C-arm 1-60 Min-no Report  02/14/2014   CLINICAL DATA: LEFT URETERAL STONE   C-ARM 1-60 MINUTES  Fluoroscopy was utilized by the requesting physician.  No radiographic  interpretation.     Scheduled Meds: . aspirin EC  81 mg Oral q morning - 10a  .  ceFAZolin (ANCEF) IV  1 g Intravenous 3 times per day  . docusate sodium  100 mg Oral BID  . enoxaparin (LOVENOX) injection  40 mg Subcutaneous Q24H  . gabapentin  800 mg Oral TID  . pantoprazole  40 mg Oral Daily  . simvastatin  40 mg Oral QPM   Continuous Infusions: . sodium chloride 125 mL/hr at 02/15/14 1049    Principal Problem:   Calculus of ureter Active Problems:   Hypertension   GERD  (gastroesophageal reflux disease)   Hydronephrosis   Acute renal failure    Time spent: 35 minutes    [place] [facility] Pager [phone]. If 7PM-7AM, please contact night-coverage at [email or web address], password [facility] 02/15/2014, 3:21 PM  LOS: 1 day

## 2014-02-15 NOTE — Progress Notes (Signed)
Patient seen and examined.  Agree with note as above per Dyanne Carrel, NP  Patient has been admitted after urologic procedure for infected ureteral stone.  Ureteral stent was placed.  Urine culture is positive for E coli.  He is currently on rocephin.  Will likely transition to keflex on discharge. Continue with hydration and recheck renal function in am.  Will need close follow up with urology.  MEMON,JEHANZEB

## 2014-02-15 NOTE — Progress Notes (Signed)
I see that Andrew Bates's Escherichia coli is sensitive to cephalosporins. If he is clinically stable, I would think it's okay to discharge him on his previous prescription of cephalexin for 1 week.  We will call to set up an appointment to have him followed up.

## 2014-02-15 NOTE — Care Management Note (Addendum)
    Page 1 of 1   02/17/2014     11:47:04 AM CARE MANAGEMENT NOTE 02/17/2014  Patient:  Andrew Bates, Andrew Bates   Account Number:  [id number]  Date Initiated:  02/15/2014  Documentation initiated by:  Theophilus Kinds  Subjective/Objective Assessment:   Pt admitted from home with kidney stones and infection. Pt lives with his wife and will return home at discharge. Pt is independent with ADL's.     Action/Plan:   No CM needs noted.   Anticipated DC Date:  02/18/2014   Anticipated DC Plan:  [place]  CM consult      Choice offered to / List presented to:             Status of service:  Completed, signed off Medicare Important Message given?  YES (If response is "NO", the following Medicare IM given date fields will be blank) Date Medicare IM given:  02/17/2014 Medicare IM given by:  Theophilus Kinds Date Additional Medicare IM given:   Additional Medicare IM given by:    Discharge Disposition:  HOME/SELF CARE  Per UR Regulation:    If discussed at Long Length of Stay Meetings, dates discussed:    Comments:  02/17/14 [place], RN BSN CM Pt anticipates discharge today. No CM needs noted.  02/15/14 [place], RN BSN CM

## 2014-02-15 NOTE — Anesthesia Postprocedure Evaluation (Signed)
  Anesthesia Post-op Note  Patient: Andrew Bates  Procedure(s) Performed: Procedure(s): CYSTOSCOPY AND LEFT DOUBLE J STENT PLACEMENT;  ATTEMPTED STONE  EXTRACTION  (Left)  Patient Location: Room 307  Anesthesia Type:General  Level of Consciousness: awake, alert , oriented and patient cooperative  Airway and Oxygen Therapy: Patient Spontanous Breathing  Post-op Pain: mild  Post-op Assessment: Post-op Vital signs reviewed, Patient's Cardiovascular Status Stable, Respiratory Function Stable, Patent Airway, No signs of Nausea or vomiting and Pain level controlled  Post-op Vital Signs: Reviewed and stable  Last Vitals:  Filed Vitals:   02/15/14 0545  BP: 118/56  Pulse: 60  Temp: 37.9 C  Resp: 20    Complications: No apparent anesthesia complications

## 2014-02-16 ENCOUNTER — Observation Stay (HOSPITAL_COMMUNITY): Payer: Commercial Managed Care - HMO

## 2014-02-16 ENCOUNTER — Encounter (HOSPITAL_COMMUNITY): Payer: Self-pay | Admitting: Urology

## 2014-02-16 ENCOUNTER — Telehealth (HOSPITAL_BASED_OUTPATIENT_CLINIC_OR_DEPARTMENT_OTHER): Payer: Self-pay | Admitting: Emergency Medicine

## 2014-02-16 DIAGNOSIS — I10 Essential (primary) hypertension: Secondary | ICD-10-CM | POA: Diagnosis present

## 2014-02-16 DIAGNOSIS — Z801 Family history of malignant neoplasm of trachea, bronchus and lung: Secondary | ICD-10-CM | POA: Diagnosis not present

## 2014-02-16 DIAGNOSIS — K219 Gastro-esophageal reflux disease without esophagitis: Secondary | ICD-10-CM | POA: Diagnosis present

## 2014-02-16 DIAGNOSIS — N1330 Unspecified hydronephrosis: Secondary | ICD-10-CM | POA: Diagnosis present

## 2014-02-16 DIAGNOSIS — N200 Calculus of kidney: Secondary | ICD-10-CM | POA: Diagnosis present

## 2014-02-16 DIAGNOSIS — Z86718 Personal history of other venous thrombosis and embolism: Secondary | ICD-10-CM | POA: Diagnosis not present

## 2014-02-16 DIAGNOSIS — F4310 Post-traumatic stress disorder, unspecified: Secondary | ICD-10-CM | POA: Diagnosis present

## 2014-02-16 DIAGNOSIS — Z87442 Personal history of urinary calculi: Secondary | ICD-10-CM | POA: Diagnosis not present

## 2014-02-16 DIAGNOSIS — N201 Calculus of ureter: Secondary | ICD-10-CM | POA: Diagnosis present

## 2014-02-16 DIAGNOSIS — N179 Acute kidney failure, unspecified: Secondary | ICD-10-CM | POA: Diagnosis present

## 2014-02-16 DIAGNOSIS — Z808 Family history of malignant neoplasm of other organs or systems: Secondary | ICD-10-CM | POA: Diagnosis not present

## 2014-02-16 DIAGNOSIS — R509 Fever, unspecified: Secondary | ICD-10-CM | POA: Diagnosis present

## 2014-02-16 DIAGNOSIS — J45909 Unspecified asthma, uncomplicated: Secondary | ICD-10-CM | POA: Diagnosis present

## 2014-02-16 DIAGNOSIS — A498 Other bacterial infections of unspecified site: Secondary | ICD-10-CM | POA: Diagnosis present

## 2014-02-16 DIAGNOSIS — Z87891 Personal history of nicotine dependence: Secondary | ICD-10-CM | POA: Diagnosis not present

## 2014-02-16 DIAGNOSIS — H8110 Benign paroxysmal vertigo, unspecified ear: Secondary | ICD-10-CM | POA: Diagnosis present

## 2014-02-16 DIAGNOSIS — E785 Hyperlipidemia, unspecified: Secondary | ICD-10-CM | POA: Diagnosis present

## 2014-02-16 DIAGNOSIS — Z7982 Long term (current) use of aspirin: Secondary | ICD-10-CM | POA: Diagnosis not present

## 2014-02-16 DIAGNOSIS — Z8249 Family history of ischemic heart disease and other diseases of the circulatory system: Secondary | ICD-10-CM | POA: Diagnosis not present

## 2014-02-16 DIAGNOSIS — Z79899 Other long term (current) drug therapy: Secondary | ICD-10-CM | POA: Diagnosis not present

## 2014-02-16 DIAGNOSIS — N12 Tubulo-interstitial nephritis, not specified as acute or chronic: Secondary | ICD-10-CM | POA: Diagnosis present

## 2014-02-16 LAB — CBC
HEMATOCRIT: 38.4 % — AB (ref 39.0–52.0)
Hemoglobin: 12.9 g/dL — ABNORMAL LOW (ref 13.0–17.0)
MCH: 32.1 pg (ref 26.0–34.0)
MCHC: 33.6 g/dL (ref 30.0–36.0)
MCV: 95.5 fL (ref 78.0–100.0)
PLATELETS: 165 10*3/uL (ref 150–400)
RBC: 4.02 MIL/uL — ABNORMAL LOW (ref 4.22–5.81)
RDW: 13.1 % (ref 11.5–15.5)
WBC: 5.7 10*3/uL (ref 4.0–10.5)

## 2014-02-16 LAB — BASIC METABOLIC PANEL
ANION GAP: 11 (ref 5–15)
BUN: 14 mg/dL (ref 6–23)
CHLORIDE: 102 meq/L (ref 96–112)
CO2: 25 mEq/L (ref 19–32)
Calcium: 8.2 mg/dL — ABNORMAL LOW (ref 8.4–10.5)
Creatinine, Ser: 1.47 mg/dL — ABNORMAL HIGH (ref 0.50–1.35)
GFR calc non Af Amer: 48 mL/min — ABNORMAL LOW (ref >90)
GFR, EST AFRICAN AMERICAN: 55 mL/min — AB (ref >90)
Glucose, Bld: 94 mg/dL (ref 70–99)
POTASSIUM: 4.3 meq/L (ref 3.7–5.3)
Sodium: 138 mEq/L (ref 137–147)

## 2014-02-16 MED ORDER — MAGNESIUM CITRATE PO SOLN
1.0000 | Freq: Once | ORAL | Status: AC
  Administered 2014-02-16: 1 via ORAL
  Filled 2014-02-16: qty 296

## 2014-02-16 MED ORDER — ONDANSETRON HCL 4 MG PO TABS
4.0000 mg | ORAL_TABLET | Freq: Four times a day (QID) | ORAL | Status: DC
  Administered 2014-02-16 – 2014-02-17 (×5): 4 mg via ORAL
  Filled 2014-02-16 (×5): qty 1

## 2014-02-16 NOTE — Telephone Encounter (Signed)
Post ED Visit - Positive Culture Follow-up  Culture report reviewed by antimicrobial stewardship pharmacist: []  Wes [place], Pharm.D., BCPS []  Heide Guile, Pharm.D., BCPS []  Alycia Rossetti, Pharm.D., BCPS []  [place], Pharm.D., BCPS, AAHIVP []  Legrand Como, Pharm.D., BCPS, AAHIVP [x]  Hassie Bruce, Pharm.D. []  Cassie Nicole Kindred, [place].D.  Positive Urine culture Treated with Cephalexin, organism sensitive to the same and no further patient follow-up is required at this time.  Ernesta Amble 02/16/2014, 5:10 PM

## 2014-02-16 NOTE — Progress Notes (Signed)
[facility] PROGRESS NOTE  Andrew Bates LTJ:[id number] DOB: July 31, 1945 DOA: 02/14/2014 PCP: Purvis Kilts, MD  Assessment/Plan: 1. Calculus of ureter, with infected stone. S/P cystoscopy with stent 02/14/14 due to infected obstructing stone per chart. Reports feeling no better this am and increased pain during night.  Max temp 100.1. Urine output good.  Will continue Ancef day #3. Will obtain renal US to r/o abscess.  Will need close urology follow up at discharge.   2. Acute renal failure. Improving with IV hydration.  Likely related to obstructive infected stone as above. Urine output 2.6L yesterday. Continue IV fluids at current rate.  Holding nephrotoxins.  See #1.   3. HTN: home meds include cozaar. Will hold for now given #2. BP currently controlled.   4. Dizziness: complains of "vertigo". Describes "bed spinning when i turn over" last night during pain episode. Valium provided with some improvement. Continue to monitor. If no improvement consider meclazine and/or PT eval.     Code Status: full Family Communication: none present Disposition Plan: home when clinically ready   Consultants:  Urology remotely  Procedures:  none  Antibiotics:  Ancef 02/14/14>>  HPI/Subjective: Lying in bed. Reports increased pain last night. Reports "vertigo". Reports no appetite. Denies nausea  Objective: Filed Vitals:   02/16/14 0814  BP:   Pulse:   Temp: 98.9 F (37.2 C)  Resp:     Intake/Output Summary (Last 24 hours) at 02/16/14 0919 Last data filed at 02/16/14 0825  Gross per 24 hour  Intake 4559.16 ml  Output   4523 ml  Net  36.16 ml   Filed Weights   02/14/14 1500  Weight: 96.344 kg (212 lb 6.4 oz)    Exam:   General:  Well nourished appears somewhat ill but not uncomfortable  Cardiovascular: RRR No MGR No LE edema  Respiratory: normal effort BS clear bilaterally no rhonchi  Abdomen: soft +BS but somewhat sluggish. Non-tender  Musculoskeletal:  no clubbing or cyanosis   Data Reviewed: Basic Metabolic Panel:  Recent Labs Lab 02/12/14 2320 02/15/14 0534 02/16/14 0540  NA 139 137 138  K 4.7 4.5 4.3  CL 101 103 102  CO2 25 25 25   GLUCOSE 109* 109* 94  BUN 18 24* 14  CREATININE 1.77* 1.86* 1.47*  CALCIUM 9.3 7.9* 8.2*   Liver Function Tests:  Recent Labs Lab 02/15/14 0534  AST 32  ALT 29  ALKPHOS 137*  BILITOT 0.6  PROT 5.9*  ALBUMIN 2.8*   No results found for this basename: LIPASE, AMYLASE,  in the last 168 hours No results found for this basename: AMMONIA,  in the last 168 hours CBC:  Recent Labs Lab 02/12/14 2320 02/15/14 0534 02/16/14 0540  WBC 13.6* 5.7 5.7  NEUTROABS 11.5*  --   --   HGB 15.9 13.2 12.9*  HCT 45.4 39.7 38.4*  MCV 95.0 95.2 95.5  PLT 215 173 165   Cardiac Enzymes: No results found for this basename: CKTOTAL, CKMB, CKMBINDEX, TROPONINI,  in the last 168 hours BNP (last 3 results) No results found for this basename: PROBNP,  in the last 8760 hours CBG: No results found for this basename: GLUCAP,  in the last 168 hours  Recent Results (from the past 240 hour(s))  URINE CULTURE     Status: None   Collection Time    02/12/14 10:00 PM      Result Value Ref Range Status   Specimen Description URINE, CATHETERIZED   Final   Special Requests  NONE   Final   Culture  Setup Time     Final   Value: 02/13/2014 10:30     Performed at [facility]     Final   Value: 80,000 COLONIES/ML     Performed at [facility]   Culture     Final   Value: ESCHERICHIA COLI     Performed at [facility]   Report Status 02/15/2014 FINAL   Final   Organism ID, Bacteria ESCHERICHIA COLI   Final     Studies: Dg C-arm 1-60 Min-no Report  02/15/2014   : Fluoroscopy was utilized by the requesting physician. No radiographic interpretation.   Electronically Signed   By: Porfirio Mylar   On: 02/15/2014 22:50    Scheduled Meds: . aspirin EC  81 mg Oral q morning -  10a  .  ceFAZolin (ANCEF) IV  1 g Intravenous 3 times per day  . docusate sodium  100 mg Oral BID  . enoxaparin (LOVENOX) injection  40 mg Subcutaneous Q24H  . gabapentin  800 mg Oral TID  . pantoprazole  40 mg Oral Daily  . simvastatin  40 mg Oral QPM   Continuous Infusions: . sodium chloride 125 mL/hr at 02/16/14 0522    Principal Problem:   Calculus of ureter Active Problems:   Hypertension   GERD (gastroesophageal reflux disease)   Hydronephrosis   Acute renal failure    Time spent: 35 minutes    [place] [facility] Pager [phone]. If 7PM-7AM, please contact night-coverage at [email or web address], password [facility] 02/16/2014, 9:19 AM  LOS: 2 days

## 2014-02-16 NOTE — Progress Notes (Signed)
Patient seen and examined. Note reviewed  He continued to have low-grade fevers this morning. He has not had any since then. Urine cultures positive for Escherichia coli. He is on appropriate antibiotics. We'll observe for another 24 hours to ensure that he remains afebrile. Renal ultrasound did not indicate any underlying developing abscess. He will need followup with urology. His renal failure is improving with intravenous hydration. We'll continue the same. He does have chronic vertigo has been having spells hospital. He is already on Valium as well as meclizine. If the patient remains afebrile, can consider discharge home tomorrow.  Kerstyn Coryell

## 2014-02-17 LAB — BASIC METABOLIC PANEL
Anion gap: 12 (ref 5–15)
BUN: 13 mg/dL (ref 6–23)
CO2: 24 meq/L (ref 19–32)
Calcium: 8.2 mg/dL — ABNORMAL LOW (ref 8.4–10.5)
Chloride: 103 mEq/L (ref 96–112)
Creatinine, Ser: 1.26 mg/dL (ref 0.50–1.35)
GFR calc Af Amer: 66 mL/min — ABNORMAL LOW (ref >90)
GFR calc non Af Amer: 57 mL/min — ABNORMAL LOW (ref >90)
Glucose, Bld: 101 mg/dL — ABNORMAL HIGH (ref 70–99)
Potassium: 4.1 mEq/L (ref 3.7–5.3)
Sodium: 139 mEq/L (ref 137–147)

## 2014-02-17 LAB — CBC
HCT: 37.6 % — ABNORMAL LOW (ref 39.0–52.0)
HEMOGLOBIN: 12.9 g/dL — AB (ref 13.0–17.0)
MCH: 32.1 pg (ref 26.0–34.0)
MCHC: 34.3 g/dL (ref 30.0–36.0)
MCV: 93.5 fL (ref 78.0–100.0)
Platelets: 176 10*3/uL (ref 150–400)
RBC: 4.02 MIL/uL — AB (ref 4.22–5.81)
RDW: 12.8 % (ref 11.5–15.5)
WBC: 5.5 10*3/uL (ref 4.0–10.5)

## 2014-02-17 MED ORDER — DSS 100 MG PO CAPS: 100.0000 mg | ORAL_CAPSULE | Freq: Two times a day (BID) | ORAL | Status: DC

## 2014-02-17 MED ORDER — CEPHALEXIN 500 MG PO CAPS: 500.0000 mg | ORAL_CAPSULE | Freq: Four times a day (QID) | ORAL | Status: DC

## 2014-02-17 NOTE — Progress Notes (Signed)
Pt discharged home today per Dr. Memon. Pt's IV site D/C'd and WDL. Pt's VSS. Pt provided with home medication list, discharge instructions and prescriptions. Verbalized understanding. Pt left floor via WC in stable condition accompanied by NT.  

## 2014-02-17 NOTE — Discharge Summary (Signed)
Physician Discharge Summary  Andrew Bates VOJ:[id number] DOB: 1946/04/20 DOA: 02/14/2014  PCP: Purvis Kilts, MD  Admit date: 02/14/2014 Discharge date: 02/17/2014  Time spent: 40 minutes  Recommendations for Outpatient Follow-up:  1. Dr. Diona Fanti office will contact you for follow up appointment   Discharge Diagnoses:  Principal Problem:   Calculus of ureter Active Problems:   Hypertension   GERD (gastroesophageal reflux disease)   Hydronephrosis   Acute renal failure Pyelonephritis Benign positional vertigo Nausea and vomiting  Discharge Condition: stable  Diet recommendation: regular  Filed Weights   02/14/14 1500  Weight: 96.344 kg (212 lb 6.4 oz)    History of present illness:  68 year old male is admitted for an operative procedure to manage a left distal ureteral stone with associated infection. He became symptomatic about 2 days prior to admission on 02/14/14 and presented to the emergency room in [place]. Evaluation included a urinalysis which revealed pyuria as well as a CT scan which revealed a 3-1/2 mm left ureterovesical calculus with hydronephrosis. He was sent home on Flomax and cephalexin. Culture was pending. He was seen in follow-up in urology office 02/13/14 in [place], and was actually doing fairly well. He developed fevers overnight up to 101.2, shakes, chills and is not feeling well. He still had left flank pain. He was seen 02/14/14 morning in [place] office, felt to need urgent decompression of his left renal unit.   Hospital Course:  1. Calculus of ureter, with infected stone. S/P cystoscopy with stent 02/14/14 due to infected obstructing stone per chart. Admitted to medical floor and provided with IV fluids and antibiotics. Renal US without evidence of abscess. Urine output good. Will discharge with keflex for 7 more days. Urology office will contact for follow up.    2. Acute renal failure. resolved with IV hydration. Likely related to  obstructive infected stone as above.    3. HTN: controlled.    4. Vertigo. Chronic. Improved with valium and meclizin e   Procedures:    Consultations:  urology  Discharge Exam: Filed Vitals:   02/17/14 0600  BP: 118/63  Pulse: 50  Temp: 99.7 F (37.6 C)  Resp: 20    General: well nourished. Appears comfortable  Cardiovascular: RRR No MGR No LE edema Respiratory: normal effort BS clear bilaterally no wheeze Abdomen: non-distended. Soft +BS   Discharge Instructions You were cared for by a hospitalist during your hospital stay. If you have any questions about your discharge medications or the care you received while you were in the hospital after you are discharged, you can call the unit and asked to speak with the hospitalist on call if the hospitalist that took care of you is not available. Once you are discharged, your primary care physician will handle any further medical issues. Please note that NO REFILLS for any discharge medications will be authorized once you are discharged, as it is imperative that you return to your primary care physician (or establish a relationship with a primary care physician if you do not have one) for your aftercare needs so that they can reassess your need for medications and monitor your lab values.     Medication List         acetaminophen 500 MG tablet  Commonly known as:  TYLENOL  Take 500 mg by mouth every 6 (six) hours as needed for mild pain or moderate pain.     aspirin EC 81 MG tablet  Take 81 mg by mouth every morning.  cephALEXin 500 MG capsule  Commonly known as:  KEFLEX  Take 1 capsule (500 mg total) by mouth 4 (four) times daily.     diazepam 2 MG tablet  Commonly known as:  VALIUM  Take 2 mg by mouth every 8 (eight) hours as needed (for vertigo).     DSS 100 MG Caps  Take 100 mg by mouth 2 (two) times daily.     gabapentin 400 MG capsule  Commonly known as:  NEURONTIN  Take 800 mg by mouth 3 (three) times  daily.     HYDROcodone-acetaminophen 5-325 MG per tablet  Commonly known as:  NORCO  Take 1-2 tablets by mouth every 4 (four) hours as needed.     losartan 25 MG tablet  Commonly known as:  COZAAR  Take 12.5 mg by mouth every morning.     meclizine 25 MG tablet  Commonly known as:  ANTIVERT  Take 25 mg by mouth every 8 (eight) hours as needed for dizziness.     omeprazole 20 MG capsule  Commonly known as:  PRILOSEC  Take 20 mg by mouth every morning.     ondansetron 4 MG disintegrating tablet  Commonly known as:  ZOFRAN ODT  Take 1 tablet (4 mg total) by mouth every 8 (eight) hours as needed for nausea.     oxybutynin 5 MG tablet  Commonly known as:  DITROPAN  Take 1 tablet (5 mg total) by mouth 3 (three) times daily.     simvastatin 40 MG tablet  Commonly known as:  ZOCOR  Take 40 mg by mouth every evening.       Allergies  Allergen Reactions  . Codeine Nausea And Vomiting    REACTION: Headache  . Morphine And Related Nausea And Vomiting  . Other Nausea And Vomiting and Other (See Comments)    "STRONG" PAIN MEDICATIONS IN GENERAL: HEADACHE  . Pentazocine Nausea And Vomiting    Headaches       Follow-up Information   Follow up with Andrew Loa, MD. (2 weeks in [place])    Specialty:  Urology   Contact information:   [place] [employer] [address] [phone]       Please follow up. (office will contact you for appointment)        The results of significant diagnostics from this hospitalization (including imaging, microbiology, ancillary and laboratory) are listed below for reference.    Significant Diagnostic Studies: Ct Abdomen Pelvis Wo Contrast  02/13/2014   CLINICAL DATA:  Left flank pain  EXAM: CT ABDOMEN AND PELVIS WITHOUT CONTRAST  TECHNIQUE: Multidetector CT imaging of the abdomen and pelvis was performed following the standard protocol without IV contrast.  COMPARISON:  None available  FINDINGS: The visualized lung bases are  clear.  Limited noncontrast evaluation of the liver is unremarkable. Gallbladder within normal limits. The spleen, adrenal glands, and pancreas demonstrate a normal unenhanced appearance.  Scattered punctate hyperdensities within the right kidney likely reflect small nonobstructive calculi. No stones seen along the course of the right renal collecting system. There is no right-sided obstructive uropathy.  On the left, there is an obstructive 4 mm calculus within the distal left ureter just proximal to the left UVJ there is secondary moderate left hydroureteronephrosis with extensive left periureteral and perinephric fat stranding. Additional tiny punctate nonobstructive calculi present within the mid left kidney.  Stomach within normal limits. No evidence of bowel obstruction. No abnormal wall thickening or inflammatory fat stranding seen about the  bowels.  Bladder within normal limits.  Prostate are unremarkable.  No free air or fluid. No adenopathy. Mild to moderate calcified atheromatous disease present within the infrarenal aorta. No intra-abdominal aneurysm.  No acute osseous abnormality. No worrisome lytic or blastic osseous lesion. Chronic anterior wedging deformity of the L1 vertebral body noted.  IMPRESSION: 1. 4 mm obstructive calculus within the distal left ureter with secondary moderate left hydroureteronephrosis and extensive left perinephric/periureteral fat stranding. 2. Additional punctate nonobstructive calculi within the bilateral kidneys.   Electronically Signed   By: Jeannine Boga M.D.   On: 02/13/2014 00:44   US Renal  02/16/2014   CLINICAL DATA:  Persistent fever.  Evaluate for abscess.  EXAM: RENAL/URINARY TRACT ULTRASOUND COMPLETE  COMPARISON:  CT, 02/13/2014.  FINDINGS: Right Kidney:  Length: 11.4 cm. Echogenicity within normal limits. No mass or hydronephrosis visualized.  Left Kidney:  Length: 13.3 cm. Overall normal echogenicity. No renal mass. No hydronephrosis.  Bladder:   Parallel echogenic focus in the left posterior bladder is most consistent with a ureteral stent. No bladder mass or stone.  IMPRESSION: 1. No renal mass.  No hydronephrosis. 2. Apparent ureteral stent in the left posterior bladder.   Electronically Signed   By: Lajean Manes M.D.   On: 02/16/2014 14:08   Dg C-arm 1-60 Min-no Report  02/15/2014   : Fluoroscopy was utilized by the requesting physician. No radiographic interpretation.   Electronically Signed   By: Porfirio Mylar   On: 02/15/2014 22:50    Microbiology: Recent Results (from the past 240 hour(s))  URINE CULTURE     Status: None   Collection Time    02/12/14 10:00 PM      Result Value Ref Range Status   Specimen Description URINE, CATHETERIZED   Final   Special Requests NONE   Final   Culture  Setup Time     Final   Value: 02/13/2014 10:30     Performed at [facility]     Final   Value: 80,000 COLONIES/ML     Performed at [facility]   Culture     Final   Value: ESCHERICHIA COLI     Performed at [facility]   Report Status 02/15/2014 FINAL   Final   Organism ID, Bacteria ESCHERICHIA COLI   Final     Labs: Basic Metabolic Panel:  Recent Labs Lab 02/12/14 2320 02/15/14 0534 02/16/14 0540 02/17/14 0531  NA 139 137 138 139  K 4.7 4.5 4.3 4.1  CL 101 103 102 103  CO2 25 25 25 24   GLUCOSE 109* 109* 94 101*  BUN 18 24* 14 13  CREATININE 1.77* 1.86* 1.47* 1.26  CALCIUM 9.3 7.9* 8.2* 8.2*   Liver Function Tests:  Recent Labs Lab 02/15/14 0534  AST 32  ALT 29  ALKPHOS 137*  BILITOT 0.6  PROT 5.9*  ALBUMIN 2.8*   No results found for this basename: LIPASE, AMYLASE,  in the last 168 hours No results found for this basename: AMMONIA,  in the last 168 hours CBC:  Recent Labs Lab 02/12/14 2320 02/15/14 0534 02/16/14 0540 02/17/14 0531  WBC 13.6* 5.7 5.7 5.5  NEUTROABS 11.5*  --   --   --   HGB 15.9 13.2 12.9* 12.9*  HCT 45.4 39.7 38.4* 37.6*  MCV 95.0 95.2 95.5  93.5  PLT 215 173 165 176   Cardiac Enzymes: No results found for this basename: CKTOTAL, CKMB, CKMBINDEX, TROPONINI,  in the last 168 hours BNP: BNP (last 3 results) No results found for this basename: PROBNP,  in the last 8760 hours CBG: No results found for this basename: GLUCAP,  in the last 168 hours     Signed:  [facility] 02/17/2014, 8:52 AM  Attending note:  Patient seen and examined. Note reviewed.  He was admitted to the hospital with abdominal pain, nausea and vomiting. He was found to have left renal stone with occlusion and hydronephrosis. He was admitted for decompression and left ureteral stent placement. Patient's urine culture was positive for Escherichia coli. His antibiotics were adjusted to Rocephin and subsequently to Keflex. His fevers have appeared to resolve. Followup renal ultrasound did not indicate any developing abscess. He'll follow up with urology in the outpatient setting. He does have chronic vertigo which has been extensively evaluated at [facility]. He feels that he may need an MRI of his C-spine since most of his symptoms started after a fall resulting in trauma to his back. Since this is a chronic issue, this can be worked up as an outpatient.  MEMON,JEHANZEB

## 2014-02-28 ENCOUNTER — Other Ambulatory Visit: Payer: Self-pay | Admitting: Urology

## 2014-02-28 ENCOUNTER — Ambulatory Visit (INDEPENDENT_AMBULATORY_CARE_PROVIDER_SITE_OTHER): Payer: Commercial Managed Care - HMO | Admitting: Urology

## 2014-02-28 DIAGNOSIS — N200 Calculus of kidney: Secondary | ICD-10-CM

## 2014-02-28 DIAGNOSIS — N390 Urinary tract infection, site not specified: Secondary | ICD-10-CM

## 2014-03-30 ENCOUNTER — Ambulatory Visit (HOSPITAL_COMMUNITY)
Admission: RE | Admit: 2014-03-30 | Discharge: 2014-03-30 | Disposition: A | Discharge status: Home / Self Care | Payer: Medicare HMO | Source: Ambulatory Visit | Attending: Urology | Admitting: Urology

## 2014-03-30 DIAGNOSIS — N201 Calculus of ureter: Secondary | ICD-10-CM | POA: Diagnosis not present

## 2014-03-30 DIAGNOSIS — Z09 Encounter for follow-up examination after completed treatment for conditions other than malignant neoplasm: Secondary | ICD-10-CM | POA: Insufficient documentation

## 2014-03-30 DIAGNOSIS — N200 Calculus of kidney: Secondary | ICD-10-CM

## 2014-04-04 ENCOUNTER — Ambulatory Visit (INDEPENDENT_AMBULATORY_CARE_PROVIDER_SITE_OTHER): Payer: Commercial Managed Care - HMO | Admitting: Urology

## 2014-04-04 DIAGNOSIS — N200 Calculus of kidney: Secondary | ICD-10-CM

## 2014-04-04 DIAGNOSIS — N390 Urinary tract infection, site not specified: Secondary | ICD-10-CM

## 2014-04-04 DIAGNOSIS — N1330 Unspecified hydronephrosis: Secondary | ICD-10-CM

## 2014-04-06 ENCOUNTER — Encounter: Payer: Self-pay | Admitting: Urology

## 2014-04-26 ENCOUNTER — Telehealth: Payer: Self-pay | Admitting: Internal Medicine

## 2014-04-26 NOTE — Telephone Encounter (Signed)
Pt states he is having problems with LLQ abdominal pain. Pt requesting to be seen. Pt scheduled to see Nicoletta Ba PA next week. Pt aware of appt.

## 2014-04-27 ENCOUNTER — Ambulatory Visit: Payer: Commercial Managed Care - HMO | Admitting: Physician Assistant

## 2014-05-02 ENCOUNTER — Ambulatory Visit (INDEPENDENT_AMBULATORY_CARE_PROVIDER_SITE_OTHER): Payer: Commercial Managed Care - HMO | Admitting: Physician Assistant

## 2014-05-02 ENCOUNTER — Encounter: Payer: Self-pay | Admitting: Physician Assistant

## 2014-05-02 ENCOUNTER — Other Ambulatory Visit: Payer: Self-pay | Admitting: Internal Medicine

## 2014-05-02 ENCOUNTER — Other Ambulatory Visit (INDEPENDENT_AMBULATORY_CARE_PROVIDER_SITE_OTHER): Payer: Commercial Managed Care - HMO

## 2014-05-02 VITALS — BP 120/70 | HR 74 | Ht 74.0 in | Wt 201.4 lb

## 2014-05-02 DIAGNOSIS — M545 Low back pain, unspecified: Secondary | ICD-10-CM

## 2014-05-02 DIAGNOSIS — R1032 Left lower quadrant pain: Secondary | ICD-10-CM

## 2014-05-02 DIAGNOSIS — Z800 Family history of malignant neoplasm of digestive organs: Secondary | ICD-10-CM

## 2014-05-02 LAB — URINALYSIS, ROUTINE W REFLEX MICROSCOPIC
BILIRUBIN URINE: NEGATIVE
Hgb urine dipstick: NEGATIVE
KETONES UR: NEGATIVE
Nitrite: NEGATIVE
PH: 6 (ref 5.0–8.0)
Specific Gravity, Urine: 1.025 (ref 1.000–1.030)
Total Protein, Urine: NEGATIVE
URINE GLUCOSE: NEGATIVE
Urobilinogen, UA: 0.2 (ref 0.0–1.0)

## 2014-05-02 LAB — BASIC METABOLIC PANEL
BUN: 12 mg/dL (ref 6–23)
CO2: 29 mEq/L (ref 19–32)
Calcium: 9.3 mg/dL (ref 8.4–10.5)
Chloride: 106 mEq/L (ref 96–112)
Creatinine, Ser: 1.4 mg/dL (ref 0.4–1.5)
GFR: 53.64 mL/min — AB (ref >60.00)
GLUCOSE: 84 mg/dL (ref 70–99)
POTASSIUM: 4.4 meq/L (ref 3.5–5.1)
Sodium: 141 mEq/L (ref 135–145)

## 2014-05-02 LAB — CBC WITH DIFFERENTIAL/PLATELET
BASOS PCT: 0.8 % (ref 0.0–3.0)
Basophils Absolute: 0.1 10*3/uL (ref 0.0–0.1)
Eosinophils Absolute: 0.3 10*3/uL (ref 0.0–0.7)
Eosinophils Relative: 2.8 % (ref 0.0–5.0)
HEMATOCRIT: 48 % (ref 39.0–52.0)
Hemoglobin: 16 g/dL (ref 13.0–17.0)
Lymphocytes Relative: 23.2 % (ref 12.0–46.0)
Lymphs Abs: 2.4 10*3/uL (ref 0.7–4.0)
MCHC: 33.4 g/dL (ref 30.0–36.0)
MCV: 95.2 fl (ref 78.0–100.0)
MONO ABS: 1 10*3/uL (ref 0.1–1.0)
Monocytes Relative: 9.2 % (ref 3.0–12.0)
NEUTROS PCT: 64 % (ref 43.0–77.0)
Neutro Abs: 6.7 10*3/uL (ref 1.4–7.7)
Platelets: 273 10*3/uL (ref 150.0–400.0)
RBC: 5.05 Mil/uL (ref 4.22–5.81)
RDW: 13.3 % (ref 11.5–15.5)
WBC: 10.4 10*3/uL (ref 4.0–10.5)

## 2014-05-02 MED ORDER — MOVIPREP 100 G PO SOLR: 1.0000 | ORAL | Status: DC

## 2014-05-02 NOTE — Progress Notes (Signed)
Subjective:    Patient ID: Andrew Bates, male    DOB: [birth year], 68 y.o.   MRN: [id number]  HPI  [name] is a pleasant 68 year old white male known to Dr. Scarlette Shorts. He last had colonoscopy in May 2010 this was a normal exam no diverticulosis noted no polyps she did have internal hemorrhoids. Patient relates he has a brother with recent history of colon cancer. Patient has had personal history of adenomatous colon polyps, hypertension hyperlipidemia and a remote DVT. He has history of ureteral lithiasis and had an episode of pyelonephritis related to obstruction from a kidney stone on the left in August 2015. He required stent placement per Dr. Shirley Muscat  and hospitalization. He says he had the stent removed about 2 weeks later. That episode was associated with a lot of left flank pain some sharp left lower quadrant pain. Patient says over the past couple of months he has continued to have intermittent dull pressure like discomfort in his left lower quadrant which is not severe but bothersome. He says the pain is present most of the time there is no relation to activity eating bowel movements etc. He also still has a dull discomfort in his left flank. He has not noted any hematuria, sometimes has some pressure with urination. He was last seen by urology in September and at that time had a renal ultrasound done on 03/30/2014 which was negative. CT scan done in August 2015 no IV contrast and did show the obstructive stone in the distal left ureter and also showed additional punctate nonobstructive calculi in both kidneys.    Review of Systems  Constitutional: Negative.   HENT: Negative.   Eyes: Negative.   Respiratory: Negative.   Cardiovascular: Negative.   Gastrointestinal: Positive for abdominal pain.  Endocrine: Negative.   Genitourinary: Positive for flank pain.  Musculoskeletal: Positive for back pain.  Allergic/Immunologic: Negative.   Neurological: Negative.   Hematological: Negative.    Psychiatric/Behavioral: Negative.    Outpatient Prescriptions Prior to Visit  Medication Sig Dispense Refill  . acetaminophen (TYLENOL) 500 MG tablet Take 500 mg by mouth every 6 (six) hours as needed for mild pain or moderate pain.    Marland Kitchen aspirin EC 81 MG tablet Take 81 mg by mouth every morning.    . diazepam (VALIUM) 2 MG tablet Take 2 mg by mouth every 8 (eight) hours as needed (for vertigo).     . gabapentin (NEURONTIN) 400 MG capsule Take 800 mg by mouth 3 (three) times daily.     Marland Kitchen losartan (COZAAR) 25 MG tablet Take 12.5 mg by mouth every morning.     . meclizine (ANTIVERT) 25 MG tablet Take 25 mg by mouth every 8 (eight) hours as needed for dizziness.    Marland Kitchen omeprazole (PRILOSEC) 20 MG capsule Take 20 mg by mouth every morning.     . simvastatin (ZOCOR) 40 MG tablet Take 40 mg by mouth every evening.    . cephALEXin (KEFLEX) 500 MG capsule Take 1 capsule (500 mg total) by mouth 4 (four) times daily. 40 capsule 0  . docusate sodium 100 MG CAPS Take 100 mg by mouth 2 (two) times daily. 10 capsule 0  . HYDROcodone-acetaminophen (NORCO) 5-325 MG per tablet Take 1-2 tablets by mouth every 4 (four) hours as needed. 15 tablet 0  . ondansetron (ZOFRAN ODT) 4 MG disintegrating tablet Take 1 tablet (4 mg total) by mouth every 8 (eight) hours as needed for nausea. 10 tablet 0  . oxybutynin (  DITROPAN) 5 MG tablet Take 1 tablet (5 mg total) by mouth 3 (three) times daily. 30 tablet 1   No facility-administered medications prior to visit.   Allergies  Allergen Reactions  . Codeine Nausea And Vomiting    REACTION: Headache  . Morphine And Related Nausea And Vomiting  . Other Nausea And Vomiting and Other (See Comments)    "STRONG" PAIN MEDICATIONS IN GENERAL: HEADACHE  . Pentazocine Nausea And Vomiting    Headaches   Patient Active Problem List   Diagnosis Date Noted  . Hydronephrosis 02/15/2014  . Acute renal failure 02/15/2014  . Calculus of ureter 02/14/2014  . Urinary hesitancy  11/17/2013  . Overweight(278.02) 11/17/2013  . Acute pyelonephritis 11/16/2013  . Kidney stones 11/16/2013  . Vertigo 11/16/2013  . Sepsis secondary to UTI 11/16/2013  . Hypertension 11/16/2013  . Hyperlipemia 11/16/2013  . GERD (gastroesophageal reflux disease) 11/16/2013   History  Substance Use Topics  . Smoking status: Former Smoker    Types: Cigars  . Smokeless tobacco: Never Used  . Alcohol Use: No   family history includes Bladder Cancer in his brother; Colon cancer (age of onset: 86) in his brother; Hyperlipidemia in his father; Hypertension in his father; Lung cancer in his father; Thyroid cancer in his mother.     Objective:   Physical Exam  Well-developed white male in no acute distress, pleasant blood pressure 120/70 pulse 74 height 6 foot 2 weight 201. HEENT; nontraumatic normocephalic EOMI PERRLA sclera anicteric, Supple; no JVD, Cardiovascular; regular rate and rhythm with S1-S2 no murmur rub or gallop, Pulmonary; clear bilaterally, Abdomen; soft bowel sounds are present no palpable mass or hepatosplenomegaly he is tender in the left lower quadrant left mid quadrant, Rectal; exam not done, Extremities ;no clubbing cyanosis or edema skin warm and dry, Psych; mood and affect appropriate        Assessment & Plan:  #57  68 year old male with personal history of adenomatous colon polyps, negative colonoscopy 2010 and family history of colon cancer who is due for follow-up colonoscopy #2 3 month history of dull left lower quadrant pain intermittent and dull pressure in the left flank. Doubt this is secondary to a colonic issue but cannot rule out. Concerned he may have another kidney stone or perhaps developed recurrent hydronephrosis.Is the same pain he had with the obstructive stone in August 2015 which was associated with pyelonephritis- just not as severe. #3 GERD #4 hypertension #5 hyperlipidemia  Plan;we'll check CBC, BMET and UA Schedule for CT scan of the abdomen  and pelvis with IV contrast Schedule for colonoscopy with Dr. Henrene Pastor. Procedure discussed discussed in detail with the patient  and he is agreeable to proceed Further plans pending results of labs and CT

## 2014-05-02 NOTE — Patient Instructions (Addendum)
You have been scheduled for a CT scan of the abdomen and pelvis at [facility], Radiology department. You are scheduled on 05/05/14 at 8:00am. You should arrive at 7:45 am prior to your appointment time for registration. Please follow the written instructions below on the day of your exam:  WARNING: IF YOU ARE ALLERGIC TO IODINE/X-RAY DYE, PLEASE NOTIFY RADIOLOGY IMMEDIATELY AT [phone]! YOU WILL BE GIVEN A 13 HOUR PREMEDICATION PREP.  1) Do not eat or drink anything after 4:00 am (4 hours prior to your test) 2) You have been given 2 bottles of oral contrast to drink. The solution may taste better if refrigerated, but do NOT add ice or any other liquid to this solution. Shake well before drinking.    Drink 1 bottle of contrast @ 6:00am (2 hours prior to your exam)  Drink 1 bottle of contrast @ 7:00am (1 hour prior to your exam)  You may take any medications as prescribed with a small amount of water except for the following: Metformin, Glucophage, Glucovance, Avandamet, Riomet, Fortamet, Actoplus Met, Janumet, Glumetza or Metaglip. The above medications must be held the day of the exam AND 48 hours after the exam.  The purpose of you drinking the oral contrast is to aid in the visualization of your intestinal tract. The contrast solution may cause some diarrhea. Before your exam is started, you will be given a small amount of fluid to drink. Depending on your individual set of symptoms, you may also receive an intravenous injection of x-ray contrast/dye. Plan on being at [facility] for 30 minutes or long, depending on the type of exam you are having performed.  This test typically takes 30-45 minutes to complete.  If you have any questions regarding your exam or if you need to reschedule, you may call the CT department at [phone] between the hours of 8:00 am and 5:00 pm, Monday-Friday.  ________________________________________________________________________ Your  physician has requested that you go to the basement for the following lab work before leaving today: CBC/Diff, BMET, urine/culture   You have been scheduled for a colonoscopy. Please follow written instructions given to you at your visit today.  Please pick up your prep kit at the pharmacy within the next 1-3 days. If you use inhalers (even only as needed), please bring them with you on the day of your procedure. Your physician has requested that you go to www.startemmi.com and enter the access code given to you at your visit today. This web site gives a general overview about your procedure. However, you should still follow specific instructions given to you by our office regarding your preparation for the procedure.    I appreciate the opportunity to care for you.   

## 2014-05-02 NOTE — Progress Notes (Signed)
Agree with initial assessment and plans 

## 2014-05-03 ENCOUNTER — Other Ambulatory Visit: Payer: Self-pay

## 2014-05-05 ENCOUNTER — Ambulatory Visit (HOSPITAL_COMMUNITY)
Admission: RE | Admit: 2014-05-05 | Discharge: 2014-05-05 | Disposition: A | Discharge status: Home / Self Care | Payer: Medicare HMO | Source: Ambulatory Visit | Attending: Internal Medicine | Admitting: Internal Medicine

## 2014-05-05 DIAGNOSIS — R1032 Left lower quadrant pain: Secondary | ICD-10-CM | POA: Diagnosis present

## 2014-05-05 DIAGNOSIS — R918 Other nonspecific abnormal finding of lung field: Secondary | ICD-10-CM | POA: Insufficient documentation

## 2014-05-05 DIAGNOSIS — M545 Low back pain, unspecified: Secondary | ICD-10-CM

## 2014-05-05 MED ORDER — SODIUM CHLORIDE 0.9 % IJ SOLN
INTRAMUSCULAR | Status: AC
  Filled 2014-05-05: qty 60

## 2014-05-05 MED ORDER — IOHEXOL 300 MG/ML  SOLN
100.0000 mL | Freq: Once | INTRAMUSCULAR | Status: AC | PRN
  Administered 2014-05-05: 100 mL via INTRAVENOUS

## 2014-05-05 MED ORDER — SODIUM CHLORIDE 0.9 % IJ SOLN
INTRAMUSCULAR | Status: AC
  Filled 2014-05-05: qty 1000

## 2014-05-15 ENCOUNTER — Encounter: Payer: Self-pay | Admitting: Internal Medicine

## 2014-05-15 ENCOUNTER — Ambulatory Visit (AMBULATORY_SURGERY_CENTER): Payer: Commercial Managed Care - HMO | Admitting: Internal Medicine

## 2014-05-15 VITALS — BP 106/69 | HR 51 | Temp 96.5°F | Resp 49 | Ht 74.0 in | Wt 201.0 lb

## 2014-05-15 DIAGNOSIS — Z86010 Personal history of colonic polyps: Secondary | ICD-10-CM

## 2014-05-15 DIAGNOSIS — Z800 Family history of malignant neoplasm of digestive organs: Secondary | ICD-10-CM

## 2014-05-15 MED ORDER — SODIUM CHLORIDE 0.9 % IV SOLN: 500.0000 mL | INTRAVENOUS | Status: DC

## 2014-05-15 NOTE — Patient Instructions (Addendum)
YOU HAD AN ENDOSCOPIC PROCEDURE TODAY AT [facility]: Refer to the procedure report that was given to you for any specific questions about what was found during the examination.  If the procedure report does not answer your questions, please call your gastroenterologist to clarify.  If you requested that your care partner not be given the details of your procedure findings, then the procedure report has been included in a sealed envelope for you to review at your convenience later.  YOU SHOULD EXPECT: Some feelings of bloating in the abdomen. Passage of more gas than usual.  Walking can help get rid of the air that was put into your GI tract during the procedure and reduce the bloating. If you had a lower endoscopy (such as a colonoscopy or flexible sigmoidoscopy) you may notice spotting of blood in your stool or on the toilet paper. If you underwent a bowel prep for your procedure, then you may not have a normal bowel movement for a few days.  DIET: Your first meal following the procedure should be a light meal and then it is ok to progress to your normal diet.  A half-sandwich or bowl of soup is an example of a good first meal.  Heavy or fried foods are harder to digest and may make you feel nauseous or bloated.  Likewise meals heavy in dairy and vegetables can cause extra gas to form and this can also increase the bloating.  Drink plenty of fluids but you should avoid alcoholic beverages for 24 hours. Try to increase the fiber in your diet.  ACTIVITY: Your care partner should take you home directly after the procedure.  You should plan to take it easy, moving slowly for the rest of the day.  You can resume normal activity the day after the procedure however you should NOT DRIVE or use heavy machinery for 24 hours (because of the sedation medicines used during the test).    SYMPTOMS TO REPORT IMMEDIATELY: A gastroenterologist can be reached at any hour.  During normal business hours, 8:30  AM to 5:00 PM Monday through Friday, call [phone].  After hours and on weekends, please call the GI answering service at [phone] who will take a message and have the physician on call contact you.   Following lower endoscopy (colonoscopy or flexible sigmoidoscopy):  Excessive amounts of blood in the stool  Significant tenderness or worsening of abdominal pains  Swelling of the abdomen that is new, acute  Fever of 100F or higher  FOLLOW UP: If any biopsies were taken you will be contacted by phone or by letter within the next 1-3 weeks.  Call your gastroenterologist if you have not heard about the biopsies in 3 weeks.  Our staff will call the home number listed on your records the next business day following your procedure to check on you and address any questions or concerns that you may have at that time regarding the information given to you following your procedure. This is a courtesy call and so if there is no answer at the home number and we have not heard from you through the emergency physician on call, we will assume that you have returned to your regular daily activities without incident.  SIGNATURES/CONFIDENTIALITY: You and/or your care partner have signed paperwork which will be entered into your electronic medical record.  These signatures attest to the fact that that the information above on your After Visit Summary has been reviewed and is understood.  Full responsibility of the confidentiality of this discharge information lies with you and/or your care-partner.  Try to read all of the handouts given to you by your recovery room nurse.

## 2014-05-15 NOTE — Op Note (Signed)
[place]  [address] [employer] [address]. [place], [address]   COLONOSCOPY PROCEDURE REPORT  PATIENT: Andrew, Bates  MR#: [id number] BIRTHDATE: 08/13/1945 , 31  yrs. old GENDER: male ENDOSCOPIST: Eustace Quail, MD REFERRED EP:[id number] Program Recall PROCEDURE DATE:  05/15/2014 PROCEDURE:   Colonoscopy, surveillance First Screening Colonoscopy - Avg.  risk and is 50 yrs.  old or older - No.  Prior Negative Screening - Now for repeat screening. N/A  History of Adenoma - Now for follow-up colonoscopy & has been > or = to 3 yrs.  Yes hx of adenoma.  Has been 3 or more years since last colonoscopy.  Polyps Removed Today? No.  Polyps Removed Today? No.  Recommend repeat exam, <10 yrs? Yes.  High risk (family or personal hx). ASA CLASS:   Class II INDICATIONS:patient's immediate family history of colon cancer (Brother) and surveillance colonoscopy based on a history of adenomatous colonic polyp(s). Index 2002 (small TA); [id number] (-).  MEDICATIONS: Monitored anesthesia care and Propofol 180 mg IV  DESCRIPTION OF PROCEDURE:   After the risks benefits and alternatives of the procedure were thoroughly explained, informed consent was obtained.  The digital rectal exam revealed hemorrhoids.   The LB [id number]  endoscope was introduced through the anus and advanced to the cecum, which was identified by both the appendix and ileocecal valve. No adverse events experienced.   The quality of the prep was excellent, using MoviPrep  The instrument was then slowly withdrawn as the colon was fully examined.     COLON FINDINGS: A normal appearing cecum, ileocecal valve, and appendiceal orifice were identified.  The ascending, transverse, descending, sigmoid colon, and rectum appeared unremarkable. Retroflexed views revealed internal hemorrhoids. The time to cecum=2 minutes 04 seconds.  Withdrawal time=9 minutes 24 seconds. The scope was withdrawn and the procedure  completed.  COMPLICATIONS: There were no immediate complications.  ENDOSCOPIC IMPRESSION: 1. Normal colonoscopy  RECOMMENDATIONS: 1. Follow up colonoscopy in 5 years (personal, family histories)  eSigned:  Eustace Quail, MD 05/15/2014 10:38 AM   cc: Sharilyn Sites, MD and The Patient

## 2014-05-15 NOTE — Progress Notes (Signed)
A/ox3 pleased with MAC, report to Suzanne RN 

## 2014-05-16 ENCOUNTER — Telehealth: Payer: Self-pay | Admitting: *Deleted

## 2014-05-16 ENCOUNTER — Ambulatory Visit (INDEPENDENT_AMBULATORY_CARE_PROVIDER_SITE_OTHER): Payer: Commercial Managed Care - HMO | Admitting: Urology

## 2014-05-16 DIAGNOSIS — N390 Urinary tract infection, site not specified: Secondary | ICD-10-CM

## 2014-05-16 NOTE — Telephone Encounter (Signed)
Left message that we called for f/u 

## 2014-11-05 ENCOUNTER — Encounter (HOSPITAL_COMMUNITY): Payer: Self-pay | Admitting: Emergency Medicine

## 2014-11-05 ENCOUNTER — Emergency Department (HOSPITAL_COMMUNITY): Payer: Commercial Managed Care - HMO

## 2014-11-05 ENCOUNTER — Emergency Department (HOSPITAL_COMMUNITY)
Admission: EM | Admit: 2014-11-05 | Discharge: 2014-11-06 | Disposition: A | Discharge status: Home / Self Care | Payer: Commercial Managed Care - HMO | Attending: Emergency Medicine | Admitting: Emergency Medicine

## 2014-11-05 DIAGNOSIS — Z87442 Personal history of urinary calculi: Secondary | ICD-10-CM | POA: Diagnosis not present

## 2014-11-05 DIAGNOSIS — Z79899 Other long term (current) drug therapy: Secondary | ICD-10-CM | POA: Diagnosis not present

## 2014-11-05 DIAGNOSIS — E785 Hyperlipidemia, unspecified: Secondary | ICD-10-CM | POA: Insufficient documentation

## 2014-11-05 DIAGNOSIS — Z87891 Personal history of nicotine dependence: Secondary | ICD-10-CM | POA: Insufficient documentation

## 2014-11-05 DIAGNOSIS — J45909 Unspecified asthma, uncomplicated: Secondary | ICD-10-CM | POA: Insufficient documentation

## 2014-11-05 DIAGNOSIS — Y998 Other external cause status: Secondary | ICD-10-CM | POA: Insufficient documentation

## 2014-11-05 DIAGNOSIS — Z86010 Personal history of colonic polyps: Secondary | ICD-10-CM | POA: Diagnosis not present

## 2014-11-05 DIAGNOSIS — K219 Gastro-esophageal reflux disease without esophagitis: Secondary | ICD-10-CM | POA: Diagnosis not present

## 2014-11-05 DIAGNOSIS — G629 Polyneuropathy, unspecified: Secondary | ICD-10-CM | POA: Diagnosis not present

## 2014-11-05 DIAGNOSIS — S299XXA Unspecified injury of thorax, initial encounter: Secondary | ICD-10-CM | POA: Diagnosis not present

## 2014-11-05 DIAGNOSIS — Z8659 Personal history of other mental and behavioral disorders: Secondary | ICD-10-CM | POA: Diagnosis not present

## 2014-11-05 DIAGNOSIS — S199XXA Unspecified injury of neck, initial encounter: Secondary | ICD-10-CM | POA: Insufficient documentation

## 2014-11-05 DIAGNOSIS — I10 Essential (primary) hypertension: Secondary | ICD-10-CM | POA: Diagnosis not present

## 2014-11-05 DIAGNOSIS — Y9389 Activity, other specified: Secondary | ICD-10-CM | POA: Diagnosis not present

## 2014-11-05 DIAGNOSIS — Y92410 Unspecified street and highway as the place of occurrence of the external cause: Secondary | ICD-10-CM | POA: Insufficient documentation

## 2014-11-05 DIAGNOSIS — Z86718 Personal history of other venous thrombosis and embolism: Secondary | ICD-10-CM | POA: Diagnosis not present

## 2014-11-05 DIAGNOSIS — S4992XA Unspecified injury of left shoulder and upper arm, initial encounter: Secondary | ICD-10-CM | POA: Diagnosis not present

## 2014-11-05 MED ORDER — ACETAMINOPHEN 325 MG PO TABS
650.0000 mg | ORAL_TABLET | Freq: Once | ORAL | Status: AC
  Administered 2014-11-05: 650 mg via ORAL
  Filled 2014-11-05: qty 2
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
## 2015-02-25 ENCOUNTER — Emergency Department (HOSPITAL_COMMUNITY): Payer: Commercial Managed Care - HMO

## 2015-02-25 ENCOUNTER — Observation Stay (HOSPITAL_COMMUNITY): Payer: Commercial Managed Care - HMO

## 2015-02-25 ENCOUNTER — Encounter (HOSPITAL_COMMUNITY): Payer: Self-pay | Admitting: *Deleted

## 2015-02-25 ENCOUNTER — Observation Stay (HOSPITAL_COMMUNITY)
Admission: EM | Admit: 2015-02-25 | Discharge: 2015-02-27 | Disposition: A | Discharge status: Home / Self Care | Payer: Commercial Managed Care - HMO | Attending: Internal Medicine | Admitting: Internal Medicine

## 2015-02-25 DIAGNOSIS — H9190 Unspecified hearing loss, unspecified ear: Secondary | ICD-10-CM | POA: Insufficient documentation

## 2015-02-25 DIAGNOSIS — E785 Hyperlipidemia, unspecified: Secondary | ICD-10-CM | POA: Diagnosis not present

## 2015-02-25 DIAGNOSIS — Z87440 Personal history of urinary (tract) infections: Secondary | ICD-10-CM | POA: Insufficient documentation

## 2015-02-25 DIAGNOSIS — R350 Frequency of micturition: Secondary | ICD-10-CM | POA: Diagnosis not present

## 2015-02-25 DIAGNOSIS — E860 Dehydration: Secondary | ICD-10-CM | POA: Diagnosis not present

## 2015-02-25 DIAGNOSIS — Z7982 Long term (current) use of aspirin: Secondary | ICD-10-CM | POA: Diagnosis not present

## 2015-02-25 DIAGNOSIS — R270 Ataxia, unspecified: Secondary | ICD-10-CM | POA: Insufficient documentation

## 2015-02-25 DIAGNOSIS — G629 Polyneuropathy, unspecified: Secondary | ICD-10-CM | POA: Diagnosis not present

## 2015-02-25 DIAGNOSIS — K219 Gastro-esophageal reflux disease without esophagitis: Secondary | ICD-10-CM | POA: Insufficient documentation

## 2015-02-25 DIAGNOSIS — R42 Dizziness and giddiness: Secondary | ICD-10-CM | POA: Diagnosis not present

## 2015-02-25 DIAGNOSIS — M542 Cervicalgia: Secondary | ICD-10-CM

## 2015-02-25 DIAGNOSIS — Z23 Encounter for immunization: Secondary | ICD-10-CM | POA: Diagnosis not present

## 2015-02-25 DIAGNOSIS — I10 Essential (primary) hypertension: Secondary | ICD-10-CM | POA: Insufficient documentation

## 2015-02-25 DIAGNOSIS — B9620 Unspecified Escherichia coli [E. coli] as the cause of diseases classified elsewhere: Secondary | ICD-10-CM | POA: Diagnosis not present

## 2015-02-25 DIAGNOSIS — E669 Obesity, unspecified: Secondary | ICD-10-CM | POA: Diagnosis not present

## 2015-02-25 DIAGNOSIS — M47812 Spondylosis without myelopathy or radiculopathy, cervical region: Secondary | ICD-10-CM | POA: Diagnosis not present

## 2015-02-25 DIAGNOSIS — N390 Urinary tract infection, site not specified: Secondary | ICD-10-CM | POA: Diagnosis not present

## 2015-02-25 DIAGNOSIS — Z87891 Personal history of nicotine dependence: Secondary | ICD-10-CM | POA: Diagnosis not present

## 2015-02-25 DIAGNOSIS — Z6825 Body mass index (BMI) 25.0-25.9, adult: Secondary | ICD-10-CM | POA: Insufficient documentation

## 2015-02-25 LAB — URINALYSIS, ROUTINE W REFLEX MICROSCOPIC
BILIRUBIN URINE: NEGATIVE
GLUCOSE, UA: NEGATIVE mg/dL
Hgb urine dipstick: NEGATIVE
KETONES UR: NEGATIVE mg/dL
Nitrite: POSITIVE — AB
PH: 7 (ref 5.0–8.0)
Protein, ur: NEGATIVE mg/dL
SPECIFIC GRAVITY, URINE: 1.013 (ref 1.005–1.030)
Urobilinogen, UA: 0.2 mg/dL (ref 0.0–1.0)

## 2015-02-25 LAB — URINE MICROSCOPIC-ADD ON

## 2015-02-25 LAB — CBC
HCT: 46.7 % (ref 39.0–52.0)
HEMOGLOBIN: 15.9 g/dL (ref 13.0–17.0)
MCH: 32.6 pg (ref 26.0–34.0)
MCHC: 34 g/dL (ref 30.0–36.0)
MCV: 95.7 fL (ref 78.0–100.0)
PLATELETS: 218 10*3/uL (ref 150–400)
RBC: 4.88 MIL/uL (ref 4.22–5.81)
RDW: 12.7 % (ref 11.5–15.5)
WBC: 5.4 10*3/uL (ref 4.0–10.5)

## 2015-02-25 LAB — COMPREHENSIVE METABOLIC PANEL
ALBUMIN: 3.7 g/dL (ref 3.5–5.0)
ALT: 15 U/L — ABNORMAL LOW (ref 17–63)
ANION GAP: 8 (ref 5–15)
AST: 21 U/L (ref 15–41)
Alkaline Phosphatase: 86 U/L (ref 38–126)
BILIRUBIN TOTAL: 1 mg/dL (ref 0.3–1.2)
BUN: 12 mg/dL (ref 6–20)
CHLORIDE: 108 mmol/L (ref 101–111)
CO2: 23 mmol/L (ref 22–32)
Calcium: 9 mg/dL (ref 8.9–10.3)
Creatinine, Ser: 1.17 mg/dL (ref 0.61–1.24)
GFR calc Af Amer: >60 mL/min (ref >60)
GFR calc non Af Amer: >60 mL/min (ref >60)
GLUCOSE: 88 mg/dL (ref 65–99)
POTASSIUM: 4.4 mmol/L (ref 3.5–5.1)
SODIUM: 139 mmol/L (ref 135–145)
TOTAL PROTEIN: 6.2 g/dL — AB (ref 6.5–8.1)

## 2015-02-25 LAB — I-STAT CHEM 8, ED
BUN: 16 mg/dL (ref 6–20)
Calcium, Ion: 1.05 mmol/L — ABNORMAL LOW (ref 1.13–1.30)
Chloride: 107 mmol/L (ref 101–111)
Creatinine, Ser: 1.1 mg/dL (ref 0.61–1.24)
Glucose, Bld: 86 mg/dL (ref 65–99)
HEMATOCRIT: 48 % (ref 39.0–52.0)
Hemoglobin: 16.3 g/dL (ref 13.0–17.0)
Potassium: 4.3 mmol/L (ref 3.5–5.1)
Sodium: 139 mmol/L (ref 135–145)
TCO2: 23 mmol/L (ref 0–100)

## 2015-02-25 LAB — TSH: TSH: 1.143 u[IU]/mL (ref 0.350–4.500)

## 2015-02-25 LAB — DIFFERENTIAL
BASOS ABS: 0 10*3/uL (ref 0.0–0.1)
BASOS PCT: 1 % (ref 0–1)
EOS ABS: 0.3 10*3/uL (ref 0.0–0.7)
EOS PCT: 5 % (ref 0–5)
LYMPHS ABS: 1.1 10*3/uL (ref 0.7–4.0)
Lymphocytes Relative: 21 % (ref 12–46)
Monocytes Absolute: 0.4 10*3/uL (ref 0.1–1.0)
Monocytes Relative: 7 % (ref 3–12)
NEUTROS PCT: 66 % (ref 43–77)
Neutro Abs: 3.6 10*3/uL (ref 1.7–7.7)

## 2015-02-25 LAB — PROTIME-INR
INR: 1.01 (ref 0.00–1.49)
Prothrombin Time: 13.5 seconds (ref 11.6–15.2)

## 2015-02-25 LAB — I-STAT TROPONIN, ED: Troponin i, poc: 0.01 ng/mL (ref 0.00–0.08)

## 2015-02-25 LAB — ETHANOL

## 2015-02-25 LAB — APTT: APTT: 23 s — AB (ref 24–37)

## 2015-02-25 MED ORDER — SODIUM CHLORIDE 0.9 % IJ SOLN
3.0000 mL | Freq: Two times a day (BID) | INTRAMUSCULAR | Status: DC
  Administered 2015-02-26 – 2015-02-27 (×2): 3 mL via INTRAVENOUS

## 2015-02-25 MED ORDER — ASPIRIN EC 81 MG PO TBEC
81.0000 mg | DELAYED_RELEASE_TABLET | Freq: Every morning | ORAL | Status: DC
  Administered 2015-02-26 – 2015-02-27 (×2): 81 mg via ORAL
  Filled 2015-02-25 (×3): qty 1

## 2015-02-25 MED ORDER — DIAZEPAM 2 MG PO TABS: 2.0000 mg | ORAL_TABLET | Freq: Three times a day (TID) | ORAL | Status: DC | PRN

## 2015-02-25 MED ORDER — MECLIZINE HCL 25 MG PO TABS: 25.0000 mg | ORAL_TABLET | Freq: Three times a day (TID) | ORAL | Status: DC | PRN

## 2015-02-25 MED ORDER — DIAZEPAM 2 MG PO TABS
2.0000 mg | ORAL_TABLET | Freq: Three times a day (TID) | ORAL | Status: DC
  Administered 2015-02-25 – 2015-02-26 (×2): 2 mg via ORAL
  Filled 2015-02-25 (×2): qty 1

## 2015-02-25 MED ORDER — PROMETHAZINE HCL 25 MG/ML IJ SOLN: 12.5000 mg | Freq: Four times a day (QID) | INTRAMUSCULAR | Status: DC | PRN

## 2015-02-25 MED ORDER — NICARDIPINE HCL 20 MG PO CAPS
20.0000 mg | ORAL_CAPSULE | Freq: Three times a day (TID) | ORAL | Status: DC
  Administered 2015-02-25 – 2015-02-26 (×2): 20 mg via ORAL
  Filled 2015-02-25 (×5): qty 1

## 2015-02-25 MED ORDER — MECLIZINE HCL 25 MG PO TABS
50.0000 mg | ORAL_TABLET | Freq: Three times a day (TID) | ORAL | Status: DC
  Administered 2015-02-25 – 2015-02-27 (×5): 50 mg via ORAL
  Filled 2015-02-25 (×8): qty 2

## 2015-02-25 MED ORDER — ENOXAPARIN SODIUM 40 MG/0.4ML ~~LOC~~ SOLN
40.0000 mg | SUBCUTANEOUS | Status: DC
  Administered 2015-02-25 – 2015-02-26 (×2): 40 mg via SUBCUTANEOUS
  Filled 2015-02-25 (×2): qty 0.4

## 2015-02-25 MED ORDER — SODIUM CHLORIDE 0.9 % IV SOLN
INTRAVENOUS | Status: DC
  Administered 2015-02-25 – 2015-02-26 (×2): via INTRAVENOUS

## 2015-02-25 MED ORDER — ACETAMINOPHEN 325 MG PO TABS: 650.0000 mg | ORAL_TABLET | Freq: Four times a day (QID) | ORAL | Status: DC | PRN

## 2015-02-25 MED ORDER — PANTOPRAZOLE SODIUM 40 MG PO TBEC
40.0000 mg | DELAYED_RELEASE_TABLET | Freq: Every day | ORAL | Status: DC
  Administered 2015-02-26 – 2015-02-27 (×2): 40 mg via ORAL
  Filled 2015-02-25 (×3): qty 1

## 2015-02-25 MED ORDER — SIMVASTATIN 40 MG PO TABS
40.0000 mg | ORAL_TABLET | Freq: Every morning | ORAL | Status: DC
  Administered 2015-02-26 – 2015-02-27 (×2): 40 mg via ORAL
  Filled 2015-02-25 (×2): qty 1

## 2015-02-25 MED ORDER — HYDRALAZINE HCL 20 MG/ML IJ SOLN: 10.0000 mg | Freq: Four times a day (QID) | INTRAMUSCULAR | Status: DC | PRN

## 2015-02-25 MED ORDER — DOCUSATE SODIUM 100 MG PO CAPS
100.0000 mg | ORAL_CAPSULE | Freq: Two times a day (BID) | ORAL | Status: DC
Start: 2015-02-25 — End: 2015-02-27
  Administered 2015-02-25 – 2015-02-27 (×4): 100 mg via ORAL
  Filled 2015-02-25 (×4): qty 1

## 2015-02-25 MED ORDER — ACETAMINOPHEN 650 MG RE SUPP: 650.0000 mg | Freq: Four times a day (QID) | RECTAL | Status: DC | PRN

## 2015-02-25 MED ORDER — LORAZEPAM 2 MG/ML IJ SOLN
1.0000 mg | Freq: Once | INTRAMUSCULAR | Status: AC
  Administered 2015-02-25: 1 mg via INTRAVENOUS
  Filled 2015-02-25: qty 1

## 2015-02-25 MED ORDER — GABAPENTIN 400 MG PO CAPS
800.0000 mg | ORAL_CAPSULE | Freq: Three times a day (TID) | ORAL | Status: DC
  Administered 2015-02-25 – 2015-02-27 (×5): 800 mg via ORAL
  Filled 2015-02-25 (×6): qty 2

## 2015-02-25 MED ORDER — INFLUENZA VAC SPLIT QUAD 0.5 ML IM SUSY
0.5000 mL | PREFILLED_SYRINGE | INTRAMUSCULAR | Status: AC
  Administered 2015-02-26: 0.5 mL via INTRAMUSCULAR
  Filled 2015-02-25: qty 0.5

## 2015-02-25 MED ORDER — LOSARTAN POTASSIUM 25 MG PO TABS: 12.5000 mg | ORAL_TABLET | Freq: Every morning | ORAL | Status: DC

## 2015-02-25 NOTE — ED Notes (Signed)
Admitting at bedside 

## 2015-02-25 NOTE — ED Notes (Signed)
Report attempted 

## 2015-02-25 NOTE — ED Notes (Addendum)
  UNABLE TO COMPLETE NEUROCHECK - PATIENT IS IN MRI
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
## 2015-02-25 NOTE — ED Notes (Signed)
Blood specimens already collected and in the mini-lab.  Phlebotomy notified.

## 2015-02-25 NOTE — ED Provider Notes (Signed)
Complains of severe feeling of room spinning onset 3 days ago. Coming by nausea. His wife and patient also states that his speech has been somewhat altered for the past 3 days. He treated himself with meclizine and Valium, without relief. On exam. Alert Glasgow Coma Score 15 No facial asymmetry. He has secretions well. Finger-nose normal. Moves all extremity swelled DTRs symmetric bilaterally knee jerk and ankle jerk biceps was normal bilaterally  Orlie Dakin, MD 02/25/15 1726

## 2015-02-25 NOTE — ED Notes (Signed)
Patient transported to MRI 

## 2015-02-25 NOTE — ED Provider Notes (Signed)
CSN: [id number]     Arrival date & time 02/25/15  1002 History   First MD Initiated Contact with Patient 02/25/15 1030     Chief Complaint  Patient presents with  . Dizziness     (Consider location/radiation/quality/duration/timing/severity/associated sxs/prior Treatment) HPI Comments: Patient with a history of HTN, Hyperlipidemia, and Vertigo presents today with a chief complaint of dizziness.  He describes the dizziness as a feeling like the room is spinning.  He states that the dizziness has been persistent since Thursday evening, 3 days ago.  He reports that dizziness feels different than his typical vertigo.  He has taken Meclizine and Valium for his symptoms without relief.  He states that his vertigo typically resolves with positional changes and does not last this long.  He reports difficulty ambulating due to feeling off balance.  He also reports associated blurred vision of the left eye for the past couple of days.  He reports numbness of lower extremities bilaterally, but reports that this is at baseline.  He denies any new focal weakness.  He reports difficulty swallowing, but states that this has been present since May and is unchanged.  He reports intermittent headache over the past couple of days and also associated nausea.  He denies fever, chills, vomiting, or diplopia.  He states that he has been evaluated by [facility] and by physicians at [facility] for vertigo in the past.   Patient is a 69 y.o. male presenting with dizziness. The history is provided by the patient.  Dizziness   Past Medical History  Diagnosis Date  . Adenomatous colon polyp   . Internal hemorrhoids   . Hypertension   . Hyperlipidemia   . Vertigo   . DVT (deep venous thrombosis) 2008  . Asthma   . Kidney stone   . Peripheral neuropathy   . GERD (gastroesophageal reflux disease)   . Post traumatic stress disorder (PTSD)   . Sleep apnea    Past Surgical History  Procedure Laterality Date  . Right  knee surgery  2006  . Carpal tunnel release Bilateral   . Cystoscopy/retrograde/ureteroscopy/stone extraction with basket Left 02/14/2014    Procedure: CYSTOSCOPY AND LEFT DOUBLE J STENT PLACEMENT;  ATTEMPTED STONE  EXTRACTION ;  Surgeon: Jorja Loa, MD;  Location: [facility];  Service: Urology;  Laterality: Left;   Family History  Problem Relation Age of Onset  . Hypertension Father   . Hyperlipidemia Father   . Lung cancer Father     from piece of abestos lodged in his lung  . Thyroid cancer Mother   . Colon cancer Brother 66  . Bladder Cancer Brother    Social History  Substance Use Topics  . Smoking status: Former Smoker    Types: Cigars  . Smokeless tobacco: Never Used  . Alcohol Use: No    Review of Systems  Neurological: Positive for dizziness.  All other systems reviewed and are negative.     Allergies  Codeine; Morphine and related; Other; Pentazocine; and Topiramate  Home Medications   Prior to Admission medications   Medication Sig Start Date End Date Taking? Authorizing Provider  acetaminophen (TYLENOL) 500 MG tablet Take 500 mg by mouth every 6 (six) hours as needed for mild pain or moderate pain.    Historical Provider, MD  amoxicillin (AMOXIL) 500 MG capsule Take 500 mg by mouth 3 (three) times daily. 10 day course starting on 10/26/2014 10/26/14   Historical Provider, MD  aspirin EC 81 MG tablet Take  81 mg by mouth every morning.    Historical Provider, MD  diazepam (VALIUM) 2 MG tablet Take 2 mg by mouth every 8 (eight) hours as needed (for vertigo).     Historical Provider, MD  gabapentin (NEURONTIN) 400 MG capsule Take 800 mg by mouth 3 (three) times daily.     Historical Provider, MD  losartan (COZAAR) 25 MG tablet Take 12.5 mg by mouth every morning.     Historical Provider, MD  meclizine (ANTIVERT) 25 MG tablet Take 25 mg by mouth every 8 (eight) hours as needed for dizziness.    Historical Provider, MD  naproxen sodium (ANAPROX) 220 MG tablet Take  440 mg by mouth daily as needed (for pain).    Historical Provider, MD  omeprazole (PRILOSEC) 20 MG capsule Take 20 mg by mouth every morning.     Historical Provider, MD  simvastatin (ZOCOR) 40 MG tablet Take 40 mg by mouth every morning.     Historical Provider, MD   BP 150/90 mmHg  Pulse 58  Temp(Src) 97.7 F (36.5 C) (Oral)  Resp 17  SpO2 95% Physical Exam  Constitutional: He appears well-developed and well-nourished.  HENT:  Head: Normocephalic and atraumatic.  Mouth/Throat: Oropharynx is clear and moist.  Eyes: EOM are normal. Pupils are equal, round, and reactive to light.  Neck: Normal range of motion. Neck supple.  Cardiovascular: Normal rate, regular rhythm and normal heart sounds.   Pulmonary/Chest: Effort normal and breath sounds normal.  Musculoskeletal: Normal range of motion.  Neurological: He is alert. He has normal strength. No cranial nerve deficit or sensory deficit. Gait abnormal. Coordination normal.  Unable to assess gait.  Patient swaying back and forth with standing  Skin: Skin is warm and dry.  Psychiatric: He has a normal mood and affect.  Nursing note and vitals reviewed.   ED Course  Procedures (including critical care time) Labs Review Labs Reviewed - No data to display  Imaging Review Mr Brain Wo Contrast  02/25/2015   CLINICAL DATA:  Vertigo and ataxia  EXAM: MRI HEAD WITHOUT CONTRAST  TECHNIQUE: Multiplanar, multiecho pulse sequences of the brain and surrounding structures were obtained without intravenous contrast.  COMPARISON:  MRI head 11/27/2009  FINDINGS: Negative for acute infarct  Cerebral volume is normal.  Ventricle size is normal.  Mild chronic microvascular ischemic change in the white matter with scattered small white matter hyperintensities. Basal ganglia and brainstem and cerebellum intact.  Negative for intracranial hemorrhage or fluid collection. Negative for mass or edema.  Paranasal sinuses clear.  Normal orbit.  Pituitary normal in  size.  IMPRESSION: Mild chronic microvascular ischemic changes are stable since 2011. No acute abnormality.   Electronically Signed   By: Franchot Gallo M.D.   On: 02/25/2015 15:08   I have personally reviewed and evaluated these images and lab results as part of my medical decision-making.   EKG Interpretation   Date/Time:  Sunday February 25 2015 11:12:13 EDT Ventricular Rate:  57 PR Interval:  156 QRS Duration: 102 QT Interval:  449 QTC Calculation: 437 R Axis:   18 Text Interpretation:  Sinus rhythm Anteroseptal infarct, age indeterminate  No significant change since last tracing Confirmed by Winfred Leeds  MD, SAM  [phone]) on 02/25/2015 11:56:27 AM     3:37 PM Reassessed patient.  He reports that his vertigo has improved  Attempted to ambulate patient.  Patient became very unsteady and felt like he was going to fall.   4:08 PM Discussed with Bryson Ha with  [facility].  She reports that she will come see the patient in the ED. MDM   Final diagnoses:  None   Patient with a history of Vertigo, HTN, and Hyperlipidemia presents today vertigo and ataxia.  He reports onset of symptoms 3 days ago.  He reports that this feels different than his typical vertigo because it has persisted.  No improvement with Meclizine or Valium prior to arrival.  Due to concern for posterior stroke MRI brain w/o contrast ordered, which was negative for acute findings.  Patient given IV Ativan in the ED with improvement of symptoms.  However, when patient attempted to ambulate he was very unsteady and nearly fell to the ground.  Therefore, patient admitted to [facility] for additional management.      Hyman Bible, PA-C 02/25/15 2007  Orlie Dakin, MD 02/26/15 1729
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
## 2015-02-26 ENCOUNTER — Observation Stay (HOSPITAL_COMMUNITY): Payer: Commercial Managed Care - HMO

## 2015-02-26 DIAGNOSIS — I10 Essential (primary) hypertension: Secondary | ICD-10-CM

## 2015-02-26 DIAGNOSIS — R42 Dizziness and giddiness: Secondary | ICD-10-CM | POA: Diagnosis not present

## 2015-02-26 DIAGNOSIS — N390 Urinary tract infection, site not specified: Secondary | ICD-10-CM

## 2015-02-26 DIAGNOSIS — R319 Hematuria, unspecified: Secondary | ICD-10-CM | POA: Diagnosis not present

## 2015-02-26 LAB — RETICULOCYTES
RBC.: 4.47 MIL/uL (ref 4.22–5.81)
RETIC COUNT ABSOLUTE: 35.8 10*3/uL (ref 19.0–186.0)
Retic Ct Pct: 0.8 % (ref 0.4–3.1)

## 2015-02-26 LAB — BASIC METABOLIC PANEL
Anion gap: 9 (ref 5–15)
BUN: 14 mg/dL (ref 6–20)
CALCIUM: 8.3 mg/dL — AB (ref 8.9–10.3)
CO2: 23 mmol/L (ref 22–32)
CREATININE: 1.17 mg/dL (ref 0.61–1.24)
Chloride: 105 mmol/L (ref 101–111)
GFR calc Af Amer: >60 mL/min (ref >60)
GLUCOSE: 87 mg/dL (ref 65–99)
Potassium: 4.1 mmol/L (ref 3.5–5.1)
SODIUM: 137 mmol/L (ref 135–145)

## 2015-02-26 LAB — IRON AND TIBC
IRON: 72 ug/dL (ref 45–182)
Saturation Ratios: 22 % (ref 17.9–39.5)
TIBC: 333 ug/dL (ref 250–450)
UIBC: 261 ug/dL

## 2015-02-26 LAB — CBC
HCT: 42.9 % (ref 39.0–52.0)
Hemoglobin: 14.7 g/dL (ref 13.0–17.0)
MCH: 32.9 pg (ref 26.0–34.0)
MCHC: 34.3 g/dL (ref 30.0–36.0)
MCV: 96 fL (ref 78.0–100.0)
PLATELETS: 203 10*3/uL (ref 150–400)
RBC: 4.47 MIL/uL (ref 4.22–5.81)
RDW: 12.7 % (ref 11.5–15.5)
WBC: 6.9 10*3/uL (ref 4.0–10.5)

## 2015-02-26 LAB — VITAMIN B12: Vitamin B-12: 311 pg/mL (ref 180–914)

## 2015-02-26 LAB — FOLATE: FOLATE: 13.5 ng/mL (ref >5.9)

## 2015-02-26 LAB — FERRITIN: Ferritin: 27 ng/mL (ref 24–336)

## 2015-02-26 MED ORDER — MIRTAZAPINE 30 MG PO TABS
45.0000 mg | ORAL_TABLET | Freq: Every day | ORAL | Status: DC
  Administered 2015-02-26: 45 mg via ORAL
  Filled 2015-02-26 (×2): qty 1

## 2015-02-26 MED ORDER — DEXTROSE 5 % IV SOLN
1.0000 g | INTRAVENOUS | Status: DC
  Administered 2015-02-26: 1 g via INTRAVENOUS
  Filled 2015-02-26 (×2): qty 10

## 2015-02-26 MED ORDER — LORAZEPAM 0.5 MG PO TABS
0.5000 mg | ORAL_TABLET | Freq: Four times a day (QID) | ORAL | Status: DC | PRN
  Administered 2015-02-26: 0.5 mg via ORAL
  Filled 2015-02-26: qty 1

## 2015-02-26 NOTE — Progress Notes (Signed)
ANTIBIOTIC CONSULT NOTE - INITIAL  Pharmacy Consult for Rocephin  Indication: UTI  Allergies  Allergen Reactions  . Other Nausea And Vomiting and Other (See Comments)    "STRONG" PAIN MEDICATIONS IN GENERAL: HEADACHE  . Codeine Nausea And Vomiting    REACTION: Headache  . Morphine And Related Nausea And Vomiting  . Pentazocine Nausea And Vomiting and Other (See Comments)    Headaches  . Topiramate Other (See Comments)    unknown    Patient Measurements: Height: 6\' 2"  (188 cm) Weight: 200 lb 6.4 oz (90.9 kg) IBW/kg (Calculated) : 82.2 Adjusted Body Weight: 90.9  Vital Signs: Temp: 97.8 F (36.6 C) (09/05 0609) Temp Source: Oral (09/05 0609) BP: 127/77 mmHg (09/05 0609) Pulse Rate: 60 (09/05 0609) Intake/Output from previous day: 09/04 0701 - 09/05 0700 In: -  Out: 600 [Urine:600] Intake/Output from this shift: Total I/O In: 120 [P.O.:120] Out: 700 [Urine:700]  Labs:  Recent Labs  02/25/15 1142 02/25/15 1149 02/26/15 0520  WBC  --  5.4 6.9  HGB 16.3 15.9 14.7  PLT  --  218 203  CREATININE 1.10 1.17 1.17   Estimated Creatinine Clearance: 70.3 mL/min (by C-G formula based on Cr of 1.17). No results for input(s): VANCOTROUGH, VANCOPEAK, VANCORANDOM, GENTTROUGH, GENTPEAK, GENTRANDOM, TOBRATROUGH, TOBRAPEAK, TOBRARND, AMIKACINPEAK, AMIKACINTROU, AMIKACIN in the last 72 hours.   Microbiology: No results found for this or any previous visit (from the past 720 hour(s)).  Medical History: Past Medical History  Diagnosis Date  . Adenomatous colon polyp   . Internal hemorrhoids   . Hypertension   . Hyperlipidemia   . Vertigo   . DVT (deep venous thrombosis) 2008  . Asthma   . Kidney stone   . Peripheral neuropathy   . GERD (gastroesophageal reflux disease)   . Post traumatic stress disorder (PTSD)   . Sleep apnea     Medications:  Scheduled:  . aspirin EC  81 mg Oral q morning - 10a  . cefTRIAXone (ROCEPHIN)  IV  1 g Intravenous Q24H  . docusate sodium   100 mg Oral BID  . enoxaparin (LOVENOX) injection  40 mg Subcutaneous Q24H  . gabapentin  800 mg Oral TID  . Influenza vac split quadrivalent PF  0.5 mL Intramuscular Tomorrow-1000  . meclizine  50 mg Oral TID  . mirtazapine  45 mg Oral QHS  . pantoprazole  40 mg Oral Daily  . simvastatin  40 mg Oral q morning - 10a  . sodium chloride  3 mL Intravenous Q12H   Assessment: 69 y/o M with PMH of vertigo presents with worsening dizziness x3 days. Patient seen at [facility] and states symptoms generally resolve with meclizine, diazepam, or moving positions; none of which have provided relief.   Pharmacy consulted to dose rocephin for possible UTI. Afebrile, WBC WNL, and no documented urinary symptoms  Plan:  Start rocephin 1gm q24h Follow up culture results  Judieth Keens, PharmD. PGY1 Resident Pager [phone] 02/26/2015,10:28 AM

## 2015-02-26 NOTE — Care Management Note (Signed)
Case Management Note  Patient Details  Name: DAGOBERTO NEALY MRN: [id number] Date of Birth: [birth year]  Subjective/Objective:                 Patient from home with spouse, was placed in observation for vertigo. PT and OT evaluations pending.   Action/Plan:  Will continue to follow and offer resources, [facility] as needed.  Expected Discharge Date:                  Expected Discharge Plan:  [place]  In-House Referral:     Discharge planning Services  CM Consult  Post Acute Care Choice:    Choice offered to:     DME Arranged:    DME Agency:     HH Arranged:    [place] Agency:     Status of Service:  In process, will continue to follow  Medicare Important Message Given:    Date Medicare IM Given:    Medicare IM give by:    Date Additional Medicare IM Given:    Additional Medicare Important Message give by:     If discussed at [place] of Stay Meetings, dates discussed:    Additional Comments:  Carles Collet, RN 02/26/2015, 11:24 AM
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
## 2015-02-26 NOTE — Progress Notes (Signed)
[facility] PROGRESS NOTE  Andrew Bates NLG:[id number] DOB: [birth year] DOA: 02/25/2015 PCP: Purvis Kilts, MD  Assessment/Plan: 69 y/o male with PMH of HTN, HPL, h/o UTIs, Chronic Vertigo >10 years, progressive hearing loss previously evaluated at [facility] presented with another episode of vertigo, persistent  1. Vertigo. Suspect Meniere's  Disease with progressive hearing loss, chronic vertigo. Neuro exam is non focal. MRI/MRA head: no acute findings. Previous MRA head/neck (2011): unremarkable as well  -we will obtain PT/OT. Try ativan, meclizine. Recommended to f/u with ENT  2. Probable UTI->? worsening vertigo. Start IV ceftriaxone. obtain cultures   3. HTN. Stable    Code Status: full Family Communication: d/w patient, his wife, son  (indicate person spoken with, relationship, and if by phone, the number) Disposition Plan: pend clinical improvement    Consultants:  noen  Procedures:  none  Antibiotics:  Ceftriaxone 9/5>>>   (indicate start date, and stop date if known)  HPI/Subjective: Alert. No distress   Objective: Filed Vitals:   02/26/15 0609  BP: 127/77  Pulse: 60  Temp: 97.8 F (36.6 C)  Resp: 16    Intake/Output Summary (Last 24 hours) at 02/26/15 0926 Last data filed at 02/26/15 0802  Gross per 24 hour  Intake      0 ml  Output   1300 ml  Net  -1300 ml   Filed Weights   02/25/15 1812  Weight: 90.9 kg (200 lb 6.4 oz)    Exam:   General:  No pains, no distress   Cardiovascular: s1,s2 rrr  Respiratory: CTA BL  Abdomen: soft, nt,nd   Musculoskeletal: no leg edema   Data Reviewed: Basic Metabolic Panel:  Recent Labs Lab 02/25/15 1142 02/25/15 1149 02/26/15 0520  NA 139 139 137  K 4.3 4.4 4.1  CL 107 108 105  CO2  --  23 23  GLUCOSE 86 88 87  BUN 16 12 14   CREATININE 1.10 1.17 1.17  CALCIUM  --  9.0 8.3*   Liver Function Tests:  Recent Labs Lab 02/25/15 1149  AST 21  ALT 15*  ALKPHOS 86  BILITOT 1.0  PROT  6.2*  ALBUMIN 3.7   No results for input(s): LIPASE, AMYLASE in the last 168 hours. No results for input(s): AMMONIA in the last 168 hours. CBC:  Recent Labs Lab 02/25/15 1142 02/25/15 1149 02/26/15 0520  WBC  --  5.4 6.9  NEUTROABS  --  3.6  --   HGB 16.3 15.9 14.7  HCT 48.0 46.7 42.9  MCV  --  95.7 96.0  PLT  --  218 203   Cardiac Enzymes: No results for input(s): CKTOTAL, CKMB, CKMBINDEX, TROPONINI in the last 168 hours. BNP (last 3 results) No results for input(s): BNP in the last 8760 hours.  ProBNP (last 3 results) No results for input(s): PROBNP in the last 8760 hours.  CBG: No results for input(s): GLUCAP in the last 168 hours.  No results found for this or any previous visit (from the past 240 hour(s)).   Studies: Mr [name] Wo Contrast  02/25/2015   CLINICAL DATA:  69 year old male with vertigo and ataxia for 3 days. Initial encounter.  EXAM: MRA HEAD WITHOUT CONTRAST  TECHNIQUE: Angiographic images of the Circle of Willis were obtained using MRA technique without intravenous contrast.  COMPARISON:  Brain MRI without contrast from 1426 hr today, and earlier.  FINDINGS: Antegrade flow in the posterior circulation with codominant distal vertebral arteries. Normal PICA origins. Normal vertebrobasilar junction. Mild  basilar artery tortuosity, no basilar stenosis. Normal SCA and PCA origins. Posterior communicating arteries are diminutive or absent. Bilateral PCA branches are within normal limits.  Antegrade flow in both ICA siphons. Tortuous distal cervical left ICA. No siphon stenosis. Ophthalmic artery origins are normal. Normal carotid termini, MCA and ACA origins.  The left A1 segment is mildly dominant. Anterior communicating artery and visualized bilateral ACA branches are within normal limits. Small median artery of the corpus callosum. MCA M1 segments and bifurcations are within normal limits. Visualized MCA branches are within normal limits.  IMPRESSION: Negative  intracranial MRA.   Electronically Signed   By: Genevie Ann M.D.   On: 02/25/2015 17:14   Mr Brain Wo Contrast  02/25/2015   CLINICAL DATA:  Vertigo and ataxia  EXAM: MRI HEAD WITHOUT CONTRAST  TECHNIQUE: Multiplanar, multiecho pulse sequences of the brain and surrounding structures were obtained without intravenous contrast.  COMPARISON:  MRI head 11/27/2009  FINDINGS: Negative for acute infarct  Cerebral volume is normal.  Ventricle size is normal.  Mild chronic microvascular ischemic change in the white matter with scattered small white matter hyperintensities. Basal ganglia and brainstem and cerebellum intact.  Negative for intracranial hemorrhage or fluid collection. Negative for mass or edema.  Paranasal sinuses clear.  Normal orbit.  Pituitary normal in size.  IMPRESSION: Mild chronic microvascular ischemic changes are stable since 2011. No acute abnormality.   Electronically Signed   By: Franchot Gallo M.D.   On: 02/25/2015 15:08    Scheduled Meds: . aspirin EC  81 mg Oral q morning - 10a  . diazepam  2 mg Oral 3 times per day  . docusate sodium  100 mg Oral BID  . enoxaparin (LOVENOX) injection  40 mg Subcutaneous Q24H  . gabapentin  800 mg Oral TID  . Influenza vac split quadrivalent PF  0.5 mL Intramuscular Tomorrow-1000  . meclizine  50 mg Oral TID  . niCARdipine  20 mg Oral 3 times per day  . pantoprazole  40 mg Oral Daily  . simvastatin  40 mg Oral q morning - 10a  . sodium chloride  3 mL Intravenous Q12H   Continuous Infusions: . sodium chloride 75 mL/hr at 02/25/15 1727    Principal Problem:   Acute onset of severe vertigo Active Problems:   Hyperlipemia   GERD (gastroesophageal reflux disease)   Urinary frequency   Dehydration   Severe vertigo    Time spent: >35 minutes     Kinnie Feil  [facility] Pager [phone]. If 7PM-7AM, please contact night-coverage at [email or web address], password [facility] 02/26/2015, 9:26 AM

## 2015-02-27 DIAGNOSIS — R319 Hematuria, unspecified: Secondary | ICD-10-CM

## 2015-02-27 DIAGNOSIS — N390 Urinary tract infection, site not specified: Secondary | ICD-10-CM | POA: Diagnosis not present

## 2015-02-27 DIAGNOSIS — R42 Dizziness and giddiness: Secondary | ICD-10-CM | POA: Diagnosis not present

## 2015-02-27 LAB — URINE CULTURE

## 2015-02-27 MED ORDER — CEPHALEXIN 500 MG PO CAPS: 500.0000 mg | ORAL_CAPSULE | Freq: Four times a day (QID) | ORAL | Status: DC

## 2015-02-27 MED ORDER — LORAZEPAM 0.5 MG PO TABS: 0.5000 mg | ORAL_TABLET | Freq: Three times a day (TID) | ORAL | Status: DC | PRN

## 2015-02-27 NOTE — Progress Notes (Signed)
Nsg Discharge Note  Admit Date:  02/25/2015 Discharge date: 02/27/2015   Raeford Razor to be D/C'd Home per MD order.  AVS completed.  Copy for chart, and copy for patient signed, and dated. Patient/caregiver able to verbalize understanding.  Discharge Medication:   Medication List    STOP taking these medications        diazepam 2 MG tablet  Commonly known as:  VALIUM     naproxen sodium 220 MG tablet  Commonly known as:  ANAPROX      TAKE these medications        acetaminophen 500 MG tablet  Commonly known as:  TYLENOL  Take 500 mg by mouth every 6 (six) hours as needed for mild pain or moderate pain.     aspirin EC 81 MG tablet  Take 81 mg by mouth every morning.     cephALEXin 500 MG capsule  Commonly known as:  KEFLEX  Take 1 capsule (500 mg total) by mouth 4 (four) times daily.     gabapentin 400 MG capsule  Commonly known as:  NEURONTIN  Take 800 mg by mouth 3 (three) times daily.     LORazepam 0.5 MG tablet  Commonly known as:  ATIVAN  Take 1 tablet (0.5 mg total) by mouth every 8 (eight) hours as needed for anxiety.     losartan 25 MG tablet  Commonly known as:  COZAAR  Take 12.5 mg by mouth every morning.     meclizine 25 MG tablet  Commonly known as:  ANTIVERT  Take 25 mg by mouth every 8 (eight) hours as needed for dizziness.     omeprazole 20 MG capsule  Commonly known as:  PRILOSEC  Take 20 mg by mouth every morning.     simvastatin 40 MG tablet  Commonly known as:  ZOCOR  Take 40 mg by mouth every morning.     tetrahydrozoline-zinc 0.05-0.25 % ophthalmic solution  Commonly known as:  VISINE-AC  Place 2 drops into both eyes 3 (three) times daily as needed.        Discharge Assessment: Filed Vitals:   02/27/15 0601  BP: 130/79  Pulse: 58  Temp: 97.8 F (36.6 C)  Resp: 18   Skin clean, dry and intact without evidence of skin break down, no evidence of skin tears noted. IV catheter discontinued intact. Site without signs and symptoms  of complications - no redness or edema noted at insertion site, patient denies c/o pain - only slight tenderness at site.  Dressing with slight pressure applied.  D/c Instructions-Education: Discharge instructions given to patient/family with verbalized understanding. D/c education completed with patient/family including follow up instructions, medication list, d/c activities limitations if indicated, with other d/c instructions as indicated by MD - patient able to verbalize understanding, all questions fully answered. Patient instructed to return to ED, call 911, or call MD for any changes in condition.  Patient escorted via [place], and D/C home via private auto.  Helmi Hechavarria Margaretha Sheffield, RN 02/27/2015 3:00 PM

## 2015-02-27 NOTE — Care Management Note (Signed)
Case Management Note  Patient Details  Name: REMO KIRSCHENMANN MRN: [id number] Date of Birth: 08/30/1945  Subjective/Objective:                 Patient from home with wife. Has cane and walker available at home. Has follow up in [place] with [place] neurologist in November. Patient and wife deny any other CM assistance at this time.    Action/Plan:  DC to home with wife.  Expected Discharge Date:                  Expected Discharge Plan:  [place]  In-House Referral:     Discharge planning Services  CM Consult  Post Acute Care Choice:    Choice offered to:     DME Arranged:    DME Agency:     HH Arranged:    [place] Agency:     Status of Service:  Completed, signed off  Medicare Important Message Given:    Date Medicare IM Given:    Medicare IM give by:    Date Additional Medicare IM Given:    Additional Medicare Important Message give by:     If discussed at [place] of Stay Meetings, dates discussed:    Additional Comments:  Carles Collet, RN 02/27/2015, 12:14 PM

## 2015-02-27 NOTE — Discharge Summary (Signed)
Physician Discharge Summary  Andrew Bates PVX:[id number] DOB: [birth year] DOA: 02/25/2015  PCP: Andrew Kilts, MD  Admit date: 02/25/2015 Discharge date: 02/27/2015  Time spent: >35 minutes  Recommendations for Outpatient Follow-up:  F/u with ENT in 2 weeks F/u with PCP in 1 week F/u with neurosurgery in 1-3 weeks as needed  Discharge Diagnoses:  Principal Problem:   Acute onset of severe vertigo Active Problems:   Hyperlipemia   GERD (gastroesophageal reflux disease)   Urinary frequency   Dehydration   Severe vertigo   Discharge Condition: stable   Diet recommendation: low sodium   Filed Weights   02/25/15 1812  Weight: 90.9 kg (200 lb 6.4 oz)    History of present illness:  69 y/o male with PMH of HTN, HPL, h/o UTIs, Chronic Vertigo >10 years, progressive hearing loss previously evaluated at [facility] presented with another episode of vertigo, persistent -"Patient presents with another episode of severe vertigo onset this past Thursday. Patient reports that he was preparing to work on an automobile, he slowly lowered himself to the ground on his back and as he turned to his left had an episode of vertigo. He typically takes meclizine 25 mg on schedule 3 times a day with occurrences of episodes as well as Valium. When this did not resolve his symptoms he increased his meclizine to 50 mg. He continued to have vertigo symptoms associated with inability to eat and nausea. Typically his vertigo symptoms when they occur are precipitated by left sided movement. "   Hospital Course:  1. Vertigo. Suspect Meniere's Disease with progressive hearing loss, chronic vertigo. Neuro exam is non focal. MRI/MRA head: no acute findings. Previous MRA head/neck (2011): unremarkable as well  -Patient improved with PT and prn ativan. He is recommended to f/u with ENT as outpatient.   2. UTI->? worsened vertigo. Patient received IV ceftriaxone. He remained afebrile. Cultures: E coli. Then  antibiotics transitioned to oral keflex to complete the treatment regimen  3. HTN. Stable  4. Cervical  DJD, arthritis. Neuro exam is non focal. Sensation, motor is intact. D/w patient, recommended to f/u with spinal surgeon as outpatient    Procedures:  none (i.e. Studies not automatically included, echos, thoracentesis, etc; not x-rays)  Consultations:  none  Discharge Exam: Filed Vitals:   02/27/15 0601  BP: 130/79  Pulse: 58  Temp: 97.8 F (36.6 C)  Resp: 18    General: alert. No distress  Cardiovascular: s1,s2 rrr Respiratory: CTA BL  Discharge Instructions  Discharge Instructions    Diet - low sodium heart healthy    Complete by:  As directed      Discharge instructions    Complete by:  As directed   Please follow up with ENT in 2-4 weeks Please follow up with Primary care doctor in 1 week as needed Please follow up with spinal surgeon in 1-3 weeks as needed     Increase activity slowly    Complete by:  As directed             Medication List    STOP taking these medications        diazepam 2 MG tablet  Commonly known as:  VALIUM     naproxen sodium 220 MG tablet  Commonly known as:  ANAPROX      TAKE these medications        acetaminophen 500 MG tablet  Commonly known as:  TYLENOL  Take 500 mg by mouth every 6 (six) hours as  needed for mild pain or moderate pain.     aspirin EC 81 MG tablet  Take 81 mg by mouth every morning.     cephALEXin 500 MG capsule  Commonly known as:  KEFLEX  Take 1 capsule (500 mg total) by mouth 4 (four) times daily.     gabapentin 400 MG capsule  Commonly known as:  NEURONTIN  Take 800 mg by mouth 3 (three) times daily.     LORazepam 0.5 MG tablet  Commonly known as:  ATIVAN  Take 1 tablet (0.5 mg total) by mouth every 8 (eight) hours as needed for anxiety.     losartan 25 MG tablet  Commonly known as:  COZAAR  Take 12.5 mg by mouth every morning.     meclizine 25 MG tablet  Commonly known as:  ANTIVERT   Take 25 mg by mouth every 8 (eight) hours as needed for dizziness.     omeprazole 20 MG capsule  Commonly known as:  PRILOSEC  Take 20 mg by mouth every morning.     simvastatin 40 MG tablet  Commonly known as:  ZOCOR  Take 40 mg by mouth every morning.     tetrahydrozoline-zinc 0.05-0.25 % ophthalmic solution  Commonly known as:  VISINE-AC  Place 2 drops into both eyes 3 (three) times daily as needed.       Allergies  Allergen Reactions  . Other Nausea And Vomiting and Other (See Comments)    "STRONG" PAIN MEDICATIONS IN GENERAL: HEADACHE  . Codeine Nausea And Vomiting    REACTION: Headache  . Morphine And Related Nausea And Vomiting  . Pentazocine Nausea And Vomiting and Other (See Comments)    Headaches  . Topiramate Other (See Comments)    unknown       Follow-up Information    Follow up with Andrew Kilts, MD In 1 week.   Specialty:  Family Medicine   Contact information:   [address] [place] [address] [phone]        The results of significant diagnostics from this hospitalization (including imaging, microbiology, ancillary and laboratory) are listed below for reference.    Significant Diagnostic Studies: Mr Andrew Bates Wo Contrast  02/25/2015   CLINICAL DATA:  69 year old male with vertigo and ataxia for 3 days. Initial encounter.  EXAM: MRA HEAD WITHOUT CONTRAST  TECHNIQUE: Angiographic images of the Circle of Willis were obtained using MRA technique without intravenous contrast.  COMPARISON:  Brain MRI without contrast from 1426 hr today, and earlier.  FINDINGS: Antegrade flow in the posterior circulation with codominant distal vertebral arteries. Normal PICA origins. Normal vertebrobasilar junction. Mild basilar artery tortuosity, no basilar stenosis. Normal SCA and PCA origins. Posterior communicating arteries are diminutive or absent. Bilateral PCA branches are within normal limits.  Antegrade flow in both ICA siphons. Tortuous distal  cervical left ICA. No siphon stenosis. Ophthalmic artery origins are normal. Normal carotid termini, MCA and ACA origins.  The left A1 segment is mildly dominant. Anterior communicating artery and visualized bilateral ACA branches are within normal limits. Small median artery of the corpus callosum. MCA M1 segments and bifurcations are within normal limits. Visualized MCA branches are within normal limits.  IMPRESSION: Negative intracranial MRA.   Electronically Signed   By: Genevie Ann M.D.   On: 02/25/2015 17:14   Mr Brain Wo Contrast  02/25/2015   CLINICAL DATA:  Vertigo and ataxia  EXAM: MRI HEAD WITHOUT CONTRAST  TECHNIQUE: Multiplanar, multiecho pulse sequences of the brain  and surrounding structures were obtained without intravenous contrast.  COMPARISON:  MRI head 11/27/2009  FINDINGS: Negative for acute infarct  Cerebral volume is normal.  Ventricle size is normal.  Mild chronic microvascular ischemic change in the white matter with scattered small white matter hyperintensities. Basal ganglia and brainstem and cerebellum intact.  Negative for intracranial hemorrhage or fluid collection. Negative for mass or edema.  Paranasal sinuses clear.  Normal orbit.  Pituitary normal in size.  IMPRESSION: Mild chronic microvascular ischemic changes are stable since 2011. No acute abnormality.   Electronically Signed   By: Franchot Gallo M.D.   On: 02/25/2015 15:08   Mr Cervical Spine Wo Contrast  02/26/2015   CLINICAL DATA:  Vertigo.  Neck pain.  EXAM: MRI CERVICAL SPINE WITHOUT CONTRAST  TECHNIQUE: Multiplanar, multisequence MR imaging of the cervical spine was performed. No intravenous contrast was administered.  COMPARISON:  11/05/2014 hand 11/27/2009  FINDINGS: Mild spurring at the craniocervical junction without observed impingement. No significant abnormal spinal cord signal is observed. No vertebral subluxation is observed. Degenerative endplate findings at M0-1 and C6-7 with loss of intervertebral disc height  and loss of the normal cervical lordosis.  Despite efforts by the technologist and patient, motion artifact is present on today's exam and could not be eliminated. This reduces exam sensitivity and specificity. Additional findings at individual levels are as follows:  C2-3:  No impingement.  Left greater than right facet arthropathy.  C3-4: Moderate left and borderline right foraminal stenosis due to facet and uncinate spurring.  C4-5: Borderline left foraminal stenosis due to facet and uncinate spurring.  C5-6: Moderate left and mild right foraminal stenosis and mild left eccentric central narrowing of the thecal sac due to left lateral recess disc protrusion, uncinate spurring, and facet arthropathy.  C6-7: Moderate to prominent left and mild right foraminal stenosis with mild central narrowing of the thecal sac due to disc bulge, uncinate spurring, facet arthropathy, and and left lateral recess disc protrusion.  C7-T1:  No impingement.  Left facet arthropathy.  IMPRESSION: 1. Cervical spondylosis and degenerative disc disease, causing moderate to prominent impingement at C6-7; and moderate impingement at C3-4 and C5-6, as detailed above.   Electronically Signed   By: Van Clines M.D.   On: 02/26/2015 13:58    Microbiology: Recent Results (from the past 240 hour(s))  Urine culture     Status: None   Collection Time: 02/25/15  4:59 PM  Result Value Ref Range Status   Specimen Description URINE, CLEAN CATCH  Final   Special Requests NONE  Final   Culture >=100,000 COLONIES/mL ESCHERICHIA COLI  Final   Report Status 02/27/2015 FINAL  Final   Organism ID, Bacteria ESCHERICHIA COLI  Final      Susceptibility   Escherichia coli - MIC*    AMPICILLIN >=32 RESISTANT Resistant     CEFAZOLIN <=4 SENSITIVE Sensitive     CEFTRIAXONE <=1 SENSITIVE Sensitive     CIPROFLOXACIN >=4 RESISTANT Resistant     GENTAMICIN <=1 SENSITIVE Sensitive     IMIPENEM <=0.25 SENSITIVE Sensitive     NITROFURANTOIN <=16  SENSITIVE Sensitive     TRIMETH/SULFA <=20 SENSITIVE Sensitive     AMPICILLIN/SULBACTAM 16 INTERMEDIATE Intermediate     PIP/TAZO <=4 SENSITIVE Sensitive     * >=100,000 COLONIES/mL ESCHERICHIA COLI     Labs: Basic Metabolic Panel:  Recent Labs Lab 02/25/15 1142 02/25/15 1149 02/26/15 0520  NA 139 139 137  K 4.3 4.4 4.1  CL 107 108 105  CO2  --  23 23  GLUCOSE 86 88 87  BUN 16 12 14   CREATININE 1.10 1.17 1.17  CALCIUM  --  9.0 8.3*   Liver Function Tests:  Recent Labs Lab 02/25/15 1149  AST 21  ALT 15*  ALKPHOS 86  BILITOT 1.0  PROT 6.2*  ALBUMIN 3.7   No results for input(s): LIPASE, AMYLASE in the last 168 hours. No results for input(s): AMMONIA in the last 168 hours. CBC:  Recent Labs Lab 02/25/15 1142 02/25/15 1149 02/26/15 0520  WBC  --  5.4 6.9  NEUTROABS  --  3.6  --   HGB 16.3 15.9 14.7  HCT 48.0 46.7 42.9  MCV  --  95.7 96.0  PLT  --  218 203   Cardiac Enzymes: No results for input(s): CKTOTAL, CKMB, CKMBINDEX, TROPONINI in the last 168 hours. BNP: BNP (last 3 results) No results for input(s): BNP in the last 8760 hours.  ProBNP (last 3 results) No results for input(s): PROBNP in the last 8760 hours.  CBG: No results for input(s): GLUCAP in the last 168 hours.     SignedKinnie Feil  [facility] 02/27/2015, 11:16 AM
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
## 2015-03-01 NOTE — Progress Notes (Signed)
   02/26/15 1155  PT G-Codes **NOT FOR INPATIENT CLASS**  Functional Assessment Tool Used assist level  Functional Limitation Mobility: Walking and moving around  Mobility: Walking and Moving Around Current Status [phone]) CI  Mobility: Walking and Moving Around Goal Status [phone]) [facility]  03-26-2015 late entry g codes [place], [place], DPT [phone]

## 2015-03-30 ENCOUNTER — Other Ambulatory Visit: Payer: Self-pay | Admitting: Neurosurgery

## 2015-04-02 ENCOUNTER — Other Ambulatory Visit: Payer: Self-pay | Admitting: Urology

## 2015-04-02 ENCOUNTER — Ambulatory Visit (HOSPITAL_COMMUNITY)
Admission: RE | Admit: 2015-04-02 | Discharge: 2015-04-02 | Disposition: A | Discharge status: Home / Self Care | Payer: Commercial Managed Care - HMO | Source: Ambulatory Visit | Attending: Urology | Admitting: Urology

## 2015-04-02 DIAGNOSIS — N201 Calculus of ureter: Secondary | ICD-10-CM

## 2015-04-02 DIAGNOSIS — R109 Unspecified abdominal pain: Secondary | ICD-10-CM | POA: Insufficient documentation

## 2015-04-03 ENCOUNTER — Ambulatory Visit (INDEPENDENT_AMBULATORY_CARE_PROVIDER_SITE_OTHER): Payer: Commercial Managed Care - HMO | Admitting: Urology

## 2015-04-03 DIAGNOSIS — M545 Low back pain: Secondary | ICD-10-CM | POA: Diagnosis not present

## 2015-04-03 DIAGNOSIS — N390 Urinary tract infection, site not specified: Secondary | ICD-10-CM

## 2015-04-03 DIAGNOSIS — N481 Balanitis: Secondary | ICD-10-CM | POA: Diagnosis not present

## 2015-04-03 DIAGNOSIS — N5201 Erectile dysfunction due to arterial insufficiency: Secondary | ICD-10-CM | POA: Diagnosis not present

## 2015-04-06 ENCOUNTER — Encounter (HOSPITAL_COMMUNITY): Payer: Self-pay

## 2015-04-06 ENCOUNTER — Encounter (HOSPITAL_COMMUNITY): Payer: Self-pay | Admitting: Vascular Surgery

## 2015-04-06 ENCOUNTER — Encounter (HOSPITAL_COMMUNITY)
Admission: RE | Admit: 2015-04-06 | Discharge: 2015-04-06 | Disposition: A | Discharge status: Home / Self Care | Payer: Commercial Managed Care - HMO | Source: Ambulatory Visit | Attending: Neurosurgery | Admitting: Neurosurgery

## 2015-04-06 HISTORY — DX: Anxiety disorder, unspecified: F41.9

## 2015-04-06 NOTE — Pre-Procedure Instructions (Signed)
Andrew Bates  04/06/2015      [facility] - [place], [address] [place] [address] Phone: [phone] Fax: [phone]    Your procedure is scheduled on Mon, Oct 17 @ 12:10 PM  Report to [facility] Admitting at 9:10 AM.  Call this number if you have problems the morning of surgery:  [phone]   Remember:  Do not eat food or drink liquids after midnight.  Take these medicines the morning of surgery with A SIP OF WATER Albuterol<Bring Your Inhaler With You>,Gabapentin(Neurontin),Ativan(Lorazepam),Meclizine(Antivert),Omeprazole(Prilosec),Bactrim,and Visine(if needed)               Stop taking your Aspirin. No Goody's,BC's,Aleve,Ibuprofen,Fish Oil,or any Herbal Medications.    Do not wear jewelry.  Do not wear lotions, powders, or colognes.  You may wear deodorant.  Men may shave face and neck.  Do not bring valuables to the hospital.  [facility] is not responsible for any belongings or valuables.  Contacts, dentures or bridgework may not be worn into surgery.  Leave your suitcase in the car.  After surgery it may be brought to your room.  For patients admitted to the hospital, discharge time will be determined by your treatment team.  Patients discharged the day of surgery will not be allowed to drive home.    Special instructions:  [place] - Preparing for Surgery  Before surgery, you can play an important role.  Because skin is not sterile, your skin needs to be as free of germs as possible.  You can reduce the number of germs on you skin by washing with CHG (chlorahexidine gluconate) soap before surgery.  CHG is an antiseptic cleaner which kills germs and bonds with the skin to continue killing germs even after washing.  Please DO NOT use if you have an allergy to CHG or antibacterial soaps.  If your skin becomes reddened/irritated stop using the CHG and inform your nurse when you arrive at Short Stay.  Do not  shave (including legs and underarms) for at least 48 hours prior to the first CHG shower.  You may shave your face.  Please follow these instructions carefully:   1.  Shower with CHG Soap the night before surgery and the                                morning of Surgery.  2.  If you choose to wash your hair, wash your hair first as usual with your       normal shampoo.  3.  After you shampoo, rinse your hair and body thoroughly to remove the                      Shampoo.  4.  Use CHG as you would any other liquid soap.  You can apply chg directly       to the skin and wash gently with scrungie or a clean washcloth.  5.  Apply the CHG Soap to your body ONLY FROM THE NECK DOWN.        Do not use on open wounds or open sores.  Avoid contact with your eyes,       ears, mouth and genitals (private parts).  Wash genitals (private parts)       with your normal soap.  6.  Wash thoroughly, paying special attention to the  area where your surgery        will be performed.  7.  Thoroughly rinse your body with warm water from the neck down.  8.  DO NOT shower/wash with your normal soap after using and rinsing off       the CHG Soap.  9.  [name] yourself dry with a clean towel.            10.  Wear clean pajamas.            11.  Place clean sheets on your bed the night of your first shower and do not        sleep with pets.  Day of Surgery  Do not apply any lotions/deoderants the morning of surgery.  Please wear clean clothes to the hospital/surgery center.    Please read over the following fact sheets that you were given. Pain Booklet, Coughing and Deep Breathing, MRSA Information and Surgical Site Infection Prevention

## 2015-04-06 NOTE — Progress Notes (Signed)
Anesthesia Chart Review: Patient is a 69 year old male scheduled for C5-6, C6-7 ACDF on 04/09/15 by Dr. Arnoldo Morale. [name] was scheduled for this afternoon; however, just before he arrived Dr. Adline Mango office canceled his surgery for currently unknown reasons.   History includes former smoker, HLD, DVT '08, HTN, asthma, nephrolithiasis, peripheral neuropathy, GERD, OSA, PTSD, chronic vertigo with acute admission 02/25/15 (MRI/MRA unremarkable; suspected Meniere's disease). PCP is listed as Dr. Sharilyn Sites. Seen by cardiologist Dr. Dorris Carnes in 2008 for bradycardia and chest discomfort and had a normal stress test.   Meds include albuterol, Neurontin, Ativan, losartan, meclizine, Prilosec, Zocor, Bactrim DS (started 04/05/15).   02/25/15 EKG: SB at 57 bpm, anteroseptal infarct (age undetermined). Anteroseptal changes appear new when compared to 02/14/14 and 12/20/06 EKGs. There is also a Q wave in lateral lead aVL. V1-V2 may now have an incomplete right BBB pattern or QR pattern.    11/27/09 Echo: Study Conclusions - Left ventricle: The cavity size was normal. Systolic function was normal. The estimated ejection fraction was in the range of 55% to 60%. Wall motion was normal; there were no regional wall motion abnormalities. - Aortic valve: Mild regurgitation.  12/22/06 Nuclear stress test: IMPRESSION: Normal stress Myoview. Ejection fraction 51%.  02/25/15 MRI Brain/MRA Head: IMPRESSION: Mild chronic microvascular ischemic changes are stable since 2011. No acute abnormality. Negative intracranial MRA.  Had previously reviewed EKG findings and history with anesthesiologist Dr. Therisa Doyne. Plan was to repeat EKG and ensure that he has been asymptomatic from a CV standpoint. Since he is no longer scheduled for OR and thus did not have a [name] appointment, this will have to be done at a later time if surgery is rescheduled here.  Andrew Bates [facility] Short Stay Center/Anesthesiology Phone [phone] 04/06/2015 6:02 PM

## 2015-04-09 ENCOUNTER — Inpatient Hospital Stay (HOSPITAL_COMMUNITY)
Admission: RE | Admit: 2015-04-09 | Payer: Commercial Managed Care - HMO | Source: Ambulatory Visit | Admitting: Neurosurgery

## 2015-04-09 ENCOUNTER — Encounter (HOSPITAL_COMMUNITY): Admission: RE | Payer: Self-pay | Source: Ambulatory Visit

## 2015-04-09 SURGERY — ANTERIOR CERVICAL DECOMPRESSION/DISCECTOMY FUSION 2 LEVELS
Anesthesia: General

## 2015-04-11 ENCOUNTER — Ambulatory Visit (HOSPITAL_COMMUNITY): Payer: Commercial Managed Care - HMO | Attending: Neurosurgery | Admitting: Physical Therapy

## 2015-04-11 DIAGNOSIS — R201 Hypoesthesia of skin: Secondary | ICD-10-CM

## 2015-04-11 DIAGNOSIS — R6889 Other general symptoms and signs: Secondary | ICD-10-CM | POA: Diagnosis present

## 2015-04-11 DIAGNOSIS — M5412 Radiculopathy, cervical region: Secondary | ICD-10-CM

## 2015-04-11 DIAGNOSIS — M436 Torticollis: Secondary | ICD-10-CM | POA: Diagnosis present

## 2015-04-11 DIAGNOSIS — R29898 Other symptoms and signs involving the musculoskeletal system: Secondary | ICD-10-CM | POA: Diagnosis present

## 2015-04-11 NOTE — Patient Instructions (Signed)
Axial Extension (Chin Tuck)    Pull chin in and lengthen back of neck. Hold __3__ seconds while counting out loud. Repeat __10__ times. Do __1__ sessions per day.  http://gt2.exer.us/449   Copyright  VHI. All rights reserved.  AROM: Neck Flexion    Bend head forward.  Repeat __10__ times per set. Do _1___ sets per session. Do __1__ sessions per day.  http://orth.exer.us/298   Copyright  VHI. All rights reserved.  AROM: Lateral Neck Flexion    Slowly tilt head toward one shoulder, then the other.  Repeat _10___ times per set. Do __1__ sets per session. Do _1___ sessions per day.  http://orth.exer.us/296   Copyright  VHI. All rights reserved.  AROM: Neck Rotation    Turn head slowly to look over one shoulder, then the other.  Repeat __10__ times per set. Do __1__ sets per session. Do _1___ sessions per day.  http://orth.exer.us/294   Copyright  VHI. All rights reserved.
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
## 2015-04-18 ENCOUNTER — Ambulatory Visit (HOSPITAL_COMMUNITY): Payer: Commercial Managed Care - HMO

## 2015-04-18 DIAGNOSIS — M5412 Radiculopathy, cervical region: Secondary | ICD-10-CM

## 2015-04-18 DIAGNOSIS — R6889 Other general symptoms and signs: Secondary | ICD-10-CM

## 2015-04-18 DIAGNOSIS — R201 Hypoesthesia of skin: Secondary | ICD-10-CM

## 2015-04-18 DIAGNOSIS — M436 Torticollis: Secondary | ICD-10-CM

## 2015-04-18 DIAGNOSIS — R29898 Other symptoms and signs involving the musculoskeletal system: Secondary | ICD-10-CM
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
## 2015-04-20 ENCOUNTER — Ambulatory Visit (HOSPITAL_COMMUNITY): Payer: Commercial Managed Care - HMO

## 2015-04-20 DIAGNOSIS — M5412 Radiculopathy, cervical region: Secondary | ICD-10-CM

## 2015-04-20 DIAGNOSIS — M436 Torticollis: Secondary | ICD-10-CM

## 2015-04-20 DIAGNOSIS — R201 Hypoesthesia of skin: Secondary | ICD-10-CM

## 2015-04-20 DIAGNOSIS — R6889 Other general symptoms and signs: Secondary | ICD-10-CM

## 2015-04-20 DIAGNOSIS — R29898 Other symptoms and signs involving the musculoskeletal system: Secondary | ICD-10-CM

## 2015-04-20 NOTE — Therapy (Signed)
[place], [place], [address] Phone: [phone]   Fax:  [phone]  Physical Therapy Treatment  Patient Details  Name: Andrew Bates MRN: [id number] Date of Birth: [birth year] Referring Provider: Dr. Arnoldo Morale  Encounter Date: 04/20/2015      PT End of Session - 04/20/15 0929    Visit Number 3   Number of Visits 10   Date for PT Re-Evaluation 05/11/15   Authorization Type [employer] medicare   Authorization Time Period 04/11/15-06/08/15   Authorization - Visit Number 3   Authorization - Number of Visits 10   PT Start Time 0929   PT Stop Time 1020   PT Time Calculation (min) 51 min   Activity Tolerance Patient tolerated treatment well   Behavior During Therapy [facility] for tasks assessed/performed      Past Medical History  Diagnosis Date  . Adenomatous colon polyp   . Internal hemorrhoids   . Hyperlipidemia     takes Simvastatin daily  . Vertigo   . DVT (deep venous thrombosis) ([place]) 2008    takes Aspirin daily  . Kidney stone   . Peripheral neuropathy ([place])   . GERD (gastroesophageal reflux disease)   . Post traumatic stress disorder (PTSD)   . Sleep apnea   . Asthma     Albuterol daily as needed  . Hypertension     takes Losartan daily  . Anxiety     Ativan daily as needed    Past Surgical History  Procedure Laterality Date  . Right knee surgery  2006  . Carpal tunnel release Bilateral   . Cystoscopy/retrograde/ureteroscopy/stone extraction with basket Left 02/14/2014    Procedure: CYSTOSCOPY AND LEFT DOUBLE J STENT PLACEMENT;  ATTEMPTED STONE  EXTRACTION ;  Surgeon: Jorja Loa, MD;  Location: [facility];  Service: Urology;  Laterality: Left;    There were no vitals filed for this visit.  Visit Diagnosis:  Cervical radiculopathy  Stiffness of neck  Weakness of left arm  Impaired sensation  Decreased functional activity tolerance      Subjective Assessment - 04/20/15 0928    Subjective Pt stated neck is stiff today, continues to c/o dizziness daily.     Currently in Pain? Yes   Pain Score 4    Pain Location Neck   Pain Orientation Left   Pain Descriptors / Indicators Tightness          [facility] Adult PT Treatment/Exercise - 04/20/15 0001    Neck Exercises: Theraband   Shoulder Extension 10 reps;Red   Rows 10 reps;Red   Neck Exercises: Seated   Cervical Rotation Both;10 reps   Cervical Rotation Limitations 3D cervical excursion   W Back 10 reps   Shoulder Rolls Backwards;10 reps   Shoulder Rolls Limitations shoulders up, back and relax   Other Seated Exercise Rows 10x   Other Seated Exercise 3D thoracic excursion with arms on chest 10x   Manual Therapy   Manual Therapy Soft tissue mobilization;Manual Traction   Soft tissue mobilization Bil upper traps, posterior cervical musculature supine position wtih LE elevated   Manual Traction 3x 60"            PT Short Term Goals - 04/11/15 1505    PT SHORT TERM GOAL #1   Title Pt will be independent with HEP.    Time 2   Period Weeks   Status New   PT SHORT TERM GOAL #2   Title Improve cervical extension by  10 degrees to improve pt's ability to tilt head back and wash hair.    Time 2   Period Weeks   Status New   PT SHORT TERM GOAL #3   Title Increase cervical rotation ROM by 10 degrees to allow pt to turn head with decreased sx.    Time 2   Period Weeks   Status New   PT SHORT TERM GOAL #4   Title Improve strength of L elbow flexion and extension to 4+ to demonstrate decreased radicular sx.    Time 2   Period Weeks   Status New           PT Long Term Goals - 04/11/15 1507    PT LONG TERM GOAL #1   Title Pt will be independent with advanced HEP.    Time 4   Period Weeks   Status New   PT LONG TERM GOAL #2   Title Increase cervical flexion and extension by 15 degrees or greater to allow pt to tilt head forward and backward without pain to complete functional tasks.    Time 4    Period Weeks   Status New   PT LONG TERM GOAL #3   Title Improve cervical rotation to 75 or greater bilaterally to allow pt to turn head and look over shoulder when driving without pain.    Time 4   Period Weeks   Status New   PT LONG TERM GOAL #4   Title Improve strength of left elbow flexion and extension to 5/5 to demonstrate decreased radicular sx.    Time 4   Period Weeks   Status New   PT LONG TERM GOAL #5   Title Pt will report reduction of radicular sx to be occuring only 25% of the time in order to return him to PLOF.    Time 4   Period Weeks   Status New               Plan - 04/20/15 2683    Clinical Impression Statement Session focus initially with improving cervical ROM and postural strengthening.  Pt with musculature guarding cervical movements, added thoracic excursion to improve spinal mobility with improved cervical sidebending noted.  Progressed postural strengthening with therband and began deep neck flexor training.  Ended session with manual to reduce tighteness and improve cervical ROM.  Following manual pt reported increased flexibilty but increased pain and c/o increased vertigo with Lt cervical rotation.  .Pt and wife very sceptical with benefits of therapy and perserveration on vertigo symptoms, required education on purpose of therapy and constant cueing for focus on neck ROM and pain.   PT Next Visit Plan F/U with relief folllowing manual, Continue postural strengthening and begin deep neck flexor training, soft tissue mobilization and manual traction         Problem List Patient Active Problem List   Diagnosis Date Noted  . Urinary frequency 02/25/2015  . Dehydration 02/25/2015  . Severe vertigo 02/25/2015  . Hydronephrosis 02/15/2014  . Acute renal failure ([place]) 02/15/2014  . Calculus of ureter 02/14/2014  . Urinary hesitancy 11/17/2013  . Overweight(278.02) 11/17/2013  . Acute pyelonephritis 11/16/2013  . Kidney stones 11/16/2013  . Acute  onset of severe vertigo 11/16/2013  . Sepsis secondary to UTI ([place]) 11/16/2013  . Hypertension 11/16/2013  . Hyperlipemia 11/16/2013  . GERD (gastroesophageal reflux disease) 11/16/2013   Ihor Austin, LPTA; [place]  Aldona Lento 04/20/2015, 2:50 PM  [place], [place], [address] Phone: [phone]   Fax:  [phone]  Name: Andrew Bates MRN: [id number] Date of Birth: [birth year]

## 2015-04-25 ENCOUNTER — Ambulatory Visit (HOSPITAL_COMMUNITY): Payer: No Typology Code available for payment source | Attending: Neurosurgery | Admitting: Physical Therapy

## 2015-04-25 DIAGNOSIS — M436 Torticollis: Secondary | ICD-10-CM | POA: Diagnosis present

## 2015-04-25 DIAGNOSIS — R6889 Other general symptoms and signs: Secondary | ICD-10-CM | POA: Diagnosis present

## 2015-04-25 DIAGNOSIS — M5412 Radiculopathy, cervical region: Secondary | ICD-10-CM | POA: Diagnosis not present

## 2015-04-25 DIAGNOSIS — R29898 Other symptoms and signs involving the musculoskeletal system: Secondary | ICD-10-CM | POA: Insufficient documentation

## 2015-04-25 DIAGNOSIS — R201 Hypoesthesia of skin: Secondary | ICD-10-CM | POA: Insufficient documentation

## 2015-04-25 NOTE — Therapy (Signed)
[place], [place], [address] Phone: [phone]   Fax:  [phone]  Physical Therapy Treatment  Patient Details  Name: Andrew Bates MRN: [id number] Date of Birth: 03/21/1946 Referring Provider: Dr. Arnoldo Morale  Encounter Date: 04/25/2015      PT End of Session - 04/25/15 1212    Visit Number 4   Number of Visits 10   Date for PT Re-Evaluation 05/11/15   Authorization Type [employer] medicare   Authorization Time Period 04/11/15-06/08/15   Authorization - Visit Number 4   Authorization - Number of Visits 10   PT Start Time 0845   PT Stop Time 0929   PT Time Calculation (min) 44 min   Activity Tolerance Patient tolerated treatment well   Behavior During Therapy [facility] for tasks assessed/performed      Past Medical History  Diagnosis Date  . Adenomatous colon polyp   . Internal hemorrhoids   . Hyperlipidemia     takes Simvastatin daily  . Vertigo   . DVT (deep venous thrombosis) ([place]) 2008    takes Aspirin daily  . Kidney stone   . Peripheral neuropathy ([place])   . GERD (gastroesophageal reflux disease)   . Post traumatic stress disorder (PTSD)   . Sleep apnea   . Asthma     Albuterol daily as needed  . Hypertension     takes Losartan daily  . Anxiety     Ativan daily as needed    Past Surgical History  Procedure Laterality Date  . Right knee surgery  2006  . Carpal tunnel release Bilateral   . Cystoscopy/retrograde/ureteroscopy/stone extraction with basket Left 02/14/2014    Procedure: CYSTOSCOPY AND LEFT DOUBLE J STENT PLACEMENT;  ATTEMPTED STONE  EXTRACTION ;  Surgeon: Jorja Loa, MD;  Location: [facility];  Service: Urology;  Laterality: Left;    There were no vitals filed for this visit.  Visit Diagnosis:  Cervical radiculopathy  Stiffness of neck  Weakness of left arm  Impaired sensation  Decreased functional activity tolerance      Subjective Assessment - 04/25/15 0849    Subjective Pt reports that his neck has been feeling looser, but his problem is mainly his vertigo. He had 3 attacks of vertigo over the weekend, and he feels lightheaded today.    Currently in Pain? Yes   Pain Score 3                          [facility] Adult PT Treatment/Exercise - 04/25/15 0001    Neck Exercises: Theraband   Scapula Retraction 15 reps;Red   Shoulder Extension 15 reps;Red   Rows 15 reps;Red   Neck Exercises: Seated   Neck Retraction 10 reps;5 secs   Cervical Rotation Both;10 reps   Cervical Rotation Limitations 3D cervical excursion   W Back 15 reps   Shoulder Rolls Backwards;10 reps   Shoulder Rolls Limitations shoulders up, back and relax   Upper Extremity D2 10 reps;Theraband   Theraband Level (UE D2) Level 2 (Red)   Neck Exercises: Supine   Neck Retraction 10 reps;3 secs   Capital Flexion 10 reps   Manual Therapy   Manual Therapy Soft tissue mobilization;Manual Traction   Soft tissue mobilization Bil upper traps, posterior cervical musculature supine position wtih LE elevated   Manual Traction 3x 60"                  PT Short  Term Goals - 04/11/15 1505    PT SHORT TERM GOAL #1   Title Pt will be independent with HEP.    Time 2   Period Weeks   Status New   PT SHORT TERM GOAL #2   Title Improve cervical extension by 10 degrees to improve pt's ability to tilt head back and wash hair.    Time 2   Period Weeks   Status New   PT SHORT TERM GOAL #3   Title Increase cervical rotation ROM by 10 degrees to allow pt to turn head with decreased sx.    Time 2   Period Weeks   Status New   PT SHORT TERM GOAL #4   Title Improve strength of L elbow flexion and extension to 4+ to demonstrate decreased radicular sx.    Time 2   Period Weeks   Status New           PT Long Term Goals - 04/11/15 1507    PT LONG TERM GOAL #1   Title Pt will be independent with advanced HEP.    Time 4   Period Weeks   Status New   PT LONG TERM GOAL  #2   Title Increase cervical flexion and extension by 15 degrees or greater to allow pt to tilt head forward and backward without pain to complete functional tasks.    Time 4   Period Weeks   Status New   PT LONG TERM GOAL #3   Title Improve cervical rotation to 75 or greater bilaterally to allow pt to turn head and look over shoulder when driving without pain.    Time 4   Period Weeks   Status New   PT LONG TERM GOAL #4   Title Improve strength of left elbow flexion and extension to 5/5 to demonstrate decreased radicular sx.    Time 4   Period Weeks   Status New   PT LONG TERM GOAL #5   Title Pt will report reduction of radicular sx to be occuring only 25% of the time in order to return him to PLOF.    Time 4   Period Weeks   Status New               Plan - 04/25/15 1213    Clinical Impression Statement Pt presented to PT with reports of feeling that his neck is getting looser and feels less stiff. Treatment session focused on improving cervical mobility and postural strength. Pt required verbal, tactile, and visual cueing to complete standing scapular retraction with tband in order to ensure proper form and posture. Manual therapy was completed at the end of the session, pt reported that he felt like the exercises and manual therapy were improving the stiffness in his neck, however, he was not noticing improvements in dizziness. Pt was educated on role of PT and focus on improving ROM and strength of cervical spine. Pt denied any increased pain following treatment.    PT Next Visit Plan Continue with postural strengthening, deep neck flexor strengthening, manual PRN        Problem List Patient Active Problem List   Diagnosis Date Noted  . Urinary frequency 02/25/2015  . Dehydration 02/25/2015  . Severe vertigo 02/25/2015  . Hydronephrosis 02/15/2014  . Acute renal failure ([place]) 02/15/2014  . Calculus of ureter 02/14/2014  . Urinary hesitancy 11/17/2013  .  Overweight(278.02) 11/17/2013  . Acute pyelonephritis 11/16/2013  . Kidney stones 11/16/2013  . Acute  onset of severe vertigo 11/16/2013  . Sepsis secondary to UTI ([place]) 11/16/2013  . Hypertension 11/16/2013  . Hyperlipemia 11/16/2013  . GERD (gastroesophageal reflux disease) 11/16/2013    Hilma Favors, PT, DPT [phone] 04/25/2015, 12:17 PM  [place] [address] [place], [place], [address] Phone: [phone]   Fax:  [phone]  Name: Andrew Bates MRN: [id number] Date of Birth: [birth year]

## 2015-04-27 ENCOUNTER — Ambulatory Visit (HOSPITAL_COMMUNITY): Payer: No Typology Code available for payment source | Admitting: Physical Therapy

## 2015-04-27 DIAGNOSIS — M5412 Radiculopathy, cervical region: Secondary | ICD-10-CM | POA: Diagnosis not present

## 2015-04-27 DIAGNOSIS — M436 Torticollis: Secondary | ICD-10-CM

## 2015-04-27 DIAGNOSIS — R201 Hypoesthesia of skin: Secondary | ICD-10-CM

## 2015-04-27 DIAGNOSIS — R6889 Other general symptoms and signs: Secondary | ICD-10-CM

## 2015-04-27 DIAGNOSIS — R29898 Other symptoms and signs involving the musculoskeletal system: Secondary | ICD-10-CM
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
## 2015-05-02 ENCOUNTER — Ambulatory Visit (HOSPITAL_COMMUNITY): Payer: No Typology Code available for payment source

## 2015-05-02 DIAGNOSIS — R201 Hypoesthesia of skin: Secondary | ICD-10-CM

## 2015-05-02 DIAGNOSIS — R6889 Other general symptoms and signs: Secondary | ICD-10-CM

## 2015-05-02 DIAGNOSIS — R29898 Other symptoms and signs involving the musculoskeletal system: Secondary | ICD-10-CM

## 2015-05-02 DIAGNOSIS — M5412 Radiculopathy, cervical region: Secondary | ICD-10-CM

## 2015-05-02 DIAGNOSIS — M436 Torticollis: Secondary | ICD-10-CM
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
## 2015-05-04 ENCOUNTER — Ambulatory Visit (HOSPITAL_COMMUNITY): Payer: No Typology Code available for payment source

## 2015-05-04 DIAGNOSIS — M5412 Radiculopathy, cervical region: Secondary | ICD-10-CM

## 2015-05-04 DIAGNOSIS — R29898 Other symptoms and signs involving the musculoskeletal system: Secondary | ICD-10-CM

## 2015-05-04 DIAGNOSIS — M436 Torticollis: Secondary | ICD-10-CM

## 2015-05-04 DIAGNOSIS — R6889 Other general symptoms and signs: Secondary | ICD-10-CM

## 2015-05-04 DIAGNOSIS — R201 Hypoesthesia of skin: Secondary | ICD-10-CM
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
## 2015-05-09 ENCOUNTER — Ambulatory Visit (HOSPITAL_COMMUNITY): Payer: No Typology Code available for payment source | Admitting: Physical Therapy

## 2015-05-09 DIAGNOSIS — M436 Torticollis: Secondary | ICD-10-CM

## 2015-05-09 DIAGNOSIS — M5412 Radiculopathy, cervical region: Secondary | ICD-10-CM | POA: Diagnosis not present

## 2015-05-09 DIAGNOSIS — R29898 Other symptoms and signs involving the musculoskeletal system: Secondary | ICD-10-CM

## 2015-05-09 DIAGNOSIS — R201 Hypoesthesia of skin: Secondary | ICD-10-CM

## 2015-05-09 DIAGNOSIS — R6889 Other general symptoms and signs: Secondary | ICD-10-CM
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
## 2015-05-11 ENCOUNTER — Ambulatory Visit (HOSPITAL_COMMUNITY): Payer: No Typology Code available for payment source

## 2015-05-11 DIAGNOSIS — M5412 Radiculopathy, cervical region: Secondary | ICD-10-CM | POA: Diagnosis not present

## 2015-05-11 DIAGNOSIS — R201 Hypoesthesia of skin: Secondary | ICD-10-CM

## 2015-05-11 DIAGNOSIS — M436 Torticollis: Secondary | ICD-10-CM

## 2015-05-11 DIAGNOSIS — R6889 Other general symptoms and signs: Secondary | ICD-10-CM

## 2015-05-11 DIAGNOSIS — R29898 Other symptoms and signs involving the musculoskeletal system: Secondary | ICD-10-CM
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
## 2015-05-14 ENCOUNTER — Ambulatory Visit (HOSPITAL_COMMUNITY): Payer: No Typology Code available for payment source | Admitting: Physical Therapy

## 2015-05-14 DIAGNOSIS — M436 Torticollis: Secondary | ICD-10-CM

## 2015-05-14 DIAGNOSIS — R6889 Other general symptoms and signs: Secondary | ICD-10-CM

## 2015-05-14 DIAGNOSIS — M5412 Radiculopathy, cervical region: Secondary | ICD-10-CM

## 2015-05-14 DIAGNOSIS — R201 Hypoesthesia of skin: Secondary | ICD-10-CM

## 2015-05-14 DIAGNOSIS — R29898 Other symptoms and signs involving the musculoskeletal system: Secondary | ICD-10-CM

## 2015-05-14 NOTE — Therapy (Signed)
[place], [place], [address] Phone: [phone]   Fax:  [phone]  Physical Therapy Treatment  Patient Details  Name: Andrew Bates MRN: [id number] Date of Birth: 10-07-1945 Referring Provider: Dr. Arnoldo Morale  Encounter Date: 05/14/2015      PT End of Session - 05/14/15 1211    Visit Number 10   Number of Visits 14   Date for PT Re-Evaluation 06/09/15   Authorization Type [employer] medicare   Authorization Time Period 04/11/15-06/08/15   Authorization - Visit Number 10   Authorization - Number of Visits 19   PT Start Time 0850   PT Stop Time 0928   PT Time Calculation (min) 38 min   Activity Tolerance Patient tolerated treatment well   Behavior During Therapy [facility] for tasks assessed/performed      Past Medical History  Diagnosis Date  . Adenomatous colon polyp   . Internal hemorrhoids   . Hyperlipidemia     takes Simvastatin daily  . Vertigo   . DVT (deep venous thrombosis) ([place]) 2008    takes Aspirin daily  . Kidney stone   . Peripheral neuropathy ([place])   . GERD (gastroesophageal reflux disease)   . Post traumatic stress disorder (PTSD)   . Sleep apnea   . Asthma     Albuterol daily as needed  . Hypertension     takes Losartan daily  . Anxiety     Ativan daily as needed    Past Surgical History  Procedure Laterality Date  . Right knee surgery  2006  . Carpal tunnel release Bilateral   . Cystoscopy/retrograde/ureteroscopy/stone extraction with basket Left 02/14/2014    Procedure: CYSTOSCOPY AND LEFT DOUBLE J STENT PLACEMENT;  ATTEMPTED STONE  EXTRACTION ;  Surgeon: Jorja Loa, MD;  Location: [facility];  Service: Urology;  Laterality: Left;    There were no vitals filed for this visit.  Visit Diagnosis:  Cervical radiculopathy  Stiffness of neck  Weakness of left arm  Impaired sensation  Decreased functional activity tolerance      Subjective Assessment - 05/14/15 0851    Subjective Pt reports that his neck feels ok today, he thinks that he has a kidney stone or a UTI, so that is giving him more trouble.    Currently in Pain? No/denies   Pain Score 0-No pain                         [facility] Adult PT Treatment/Exercise - 05/14/15 0001    Neck Exercises: Machines for Strengthening   UBE (Upper Arm Bike) 2' forward, 2' backward  level 2   Neck Exercises: Theraband   Scapula Retraction 15 reps;Blue   Shoulder Extension 15 reps;Blue   Rows 15 reps;Blue   Neck Exercises: Standing   Wall Push Ups 15 reps   Wall Push Ups Limitations cues for form    UE D2 Limitations 15 reps with RTB   Other Standing Exercises standing scaption with 3# against wall x 15   Other Standing Exercises wall slides then lift off 10x 5"   Neck Exercises: Seated   Neck Retraction 15 reps   W Back 15 reps   Manual Therapy   Manual Therapy Soft tissue mobilization   Manual therapy comments following therex   Soft tissue mobilization cervical paraspinals, L upper trap, L scalenes   Neck Exercises: Stretches   Corner Stretch 3 reps;30 seconds  PT Short Term Goals - 05/11/15 0926    PT SHORT TERM GOAL #1   Title Pt will be independent with HEP.    Time 2   Period Weeks   Status Achieved   PT SHORT TERM GOAL #2   Title Improve cervical extension by 10 degrees to improve pt's ability to tilt head back and wash hair.    Time 2   Period Weeks   Status Achieved  Sidebending is still very limited    PT SHORT TERM GOAL #3   Title Increase cervical rotation ROM by 10 degrees to allow pt to turn head with decreased sx.    Time 2   Period Weeks   Status On-going   PT SHORT TERM GOAL #4   Title Improve strength of L elbow flexion and extension to 4+ to demonstrate decreased radicular sx.    Time 2   Period Weeks   Status Achieved           PT Long Term Goals - 05/11/15 [id number]    PT LONG TERM GOAL #1   Title Pt will be independent with  advanced HEP.    Time 4   Period Weeks   Status On-going   PT LONG TERM GOAL #2   Title Increase cervical flexion and extension by 15 degrees or greater to allow pt to tilt head forward and backward without pain to complete functional tasks.    Time 4   Period Weeks   Status Achieved   PT LONG TERM GOAL #3   Title Improve cervical rotation to 75 or greater bilaterally to allow pt to turn head and look over shoulder when driving without pain.    Time 4   Period Weeks   Status On-going   PT LONG TERM GOAL #4   Title Improve strength of left elbow flexion and extension to 5/5 to demonstrate decreased radicular sx.    Time 4   Period Weeks   Status Achieved   PT LONG TERM GOAL #5   Title Pt will report reduction of radicular sx to be occuring only 25% of the time in order to return him to PLOF.    Baseline At revevaluation, pt reports constant pairesthesia.    Time 4   Period Weeks   Status New               Plan - 05/14/15 1213    Clinical Impression Statement Continued with cervical and scapular strengthening today to decrease neck pain and improve radicular sx. Standing D2 with red tband was added to improve scapular stabilization, pt required verbal and tactile cueing for proper form. Treatment session ended with manual therapy to cervical musculature to decrease soft tissue tightness and improve mobility of spine. Pt denied any increased pain with treatment.    PT Next Visit Plan Continue with manual to upper traps, continue with scapular stabilization        Problem List Patient Active Problem List   Diagnosis Date Noted  . Urinary frequency 02/25/2015  . Dehydration 02/25/2015  . Severe vertigo 02/25/2015  . Hydronephrosis 02/15/2014  . Acute renal failure ([place]) 02/15/2014  . Calculus of ureter 02/14/2014  . Urinary hesitancy 11/17/2013  . Overweight(278.02) 11/17/2013  . Acute pyelonephritis 11/16/2013  . Kidney stones 11/16/2013  . Acute onset of severe  vertigo 11/16/2013  . Sepsis secondary to UTI ([place]) 11/16/2013  . Hypertension 11/16/2013  . Hyperlipemia 11/16/2013  . GERD (gastroesophageal reflux disease) 11/16/2013  Hilma Favors, PT, DPT [phone] 05/14/2015, 12:17 PM  [place], [place], [address] Phone: [phone]   Fax:  [phone]  Name: Andrew Bates MRN: [id number] Date of Birth: 04/05/1946

## 2015-07-05 ENCOUNTER — Encounter (HOSPITAL_COMMUNITY): Payer: Self-pay

## 2015-07-05 ENCOUNTER — Other Ambulatory Visit: Payer: Self-pay | Admitting: Neurosurgery

## 2015-07-05 ENCOUNTER — Encounter (HOSPITAL_COMMUNITY)
Admission: RE | Admit: 2015-07-05 | Discharge: 2015-07-05 | Disposition: A | Discharge status: Home / Self Care | Payer: PPO | Source: Ambulatory Visit | Attending: Neurosurgery | Admitting: Neurosurgery

## 2015-07-05 DIAGNOSIS — Z01818 Encounter for other preprocedural examination: Secondary | ICD-10-CM | POA: Diagnosis not present

## 2015-07-05 DIAGNOSIS — Z87891 Personal history of nicotine dependence: Secondary | ICD-10-CM | POA: Insufficient documentation

## 2015-07-05 DIAGNOSIS — G4733 Obstructive sleep apnea (adult) (pediatric): Secondary | ICD-10-CM | POA: Insufficient documentation

## 2015-07-05 DIAGNOSIS — Z01812 Encounter for preprocedural laboratory examination: Secondary | ICD-10-CM | POA: Insufficient documentation

## 2015-07-05 DIAGNOSIS — G629 Polyneuropathy, unspecified: Secondary | ICD-10-CM | POA: Insufficient documentation

## 2015-07-05 DIAGNOSIS — M4722 Other spondylosis with radiculopathy, cervical region: Secondary | ICD-10-CM | POA: Insufficient documentation

## 2015-07-05 DIAGNOSIS — K219 Gastro-esophageal reflux disease without esophagitis: Secondary | ICD-10-CM | POA: Insufficient documentation

## 2015-07-05 DIAGNOSIS — J45909 Unspecified asthma, uncomplicated: Secondary | ICD-10-CM | POA: Insufficient documentation

## 2015-07-05 DIAGNOSIS — E785 Hyperlipidemia, unspecified: Secondary | ICD-10-CM | POA: Diagnosis not present

## 2015-07-05 DIAGNOSIS — Z79899 Other long term (current) drug therapy: Secondary | ICD-10-CM | POA: Diagnosis not present

## 2015-07-05 DIAGNOSIS — I10 Essential (primary) hypertension: Secondary | ICD-10-CM | POA: Insufficient documentation

## 2015-07-05 DIAGNOSIS — Z86718 Personal history of other venous thrombosis and embolism: Secondary | ICD-10-CM | POA: Insufficient documentation

## 2015-07-05 HISTORY — DX: Unspecified osteoarthritis, unspecified site: M19.90

## 2015-07-05 HISTORY — DX: Personal history of urinary calculi: Z87.442

## 2015-07-05 HISTORY — DX: Adverse effect of unspecified anesthetic, initial encounter: T41.45XA

## 2015-07-05 LAB — BASIC METABOLIC PANEL
Anion gap: 11 (ref 5–15)
BUN: 11 mg/dL (ref 6–20)
CALCIUM: 9.5 mg/dL (ref 8.9–10.3)
CO2: 26 mmol/L (ref 22–32)
CREATININE: 1.22 mg/dL (ref 0.61–1.24)
Chloride: 104 mmol/L (ref 101–111)
GFR calc Af Amer: >60 mL/min (ref >60)
GFR calc non Af Amer: 59 mL/min — ABNORMAL LOW (ref >60)
GLUCOSE: 104 mg/dL — AB (ref 65–99)
Potassium: 4.5 mmol/L (ref 3.5–5.1)
Sodium: 141 mmol/L (ref 135–145)

## 2015-07-05 LAB — CBC
HEMATOCRIT: 48.9 % (ref 39.0–52.0)
Hemoglobin: 16.5 g/dL (ref 13.0–17.0)
MCH: 32.8 pg (ref 26.0–34.0)
MCHC: 33.7 g/dL (ref 30.0–36.0)
MCV: 97.2 fL (ref 78.0–100.0)
PLATELETS: 231 10*3/uL (ref 150–400)
RBC: 5.03 MIL/uL (ref 4.22–5.81)
RDW: 12.7 % (ref 11.5–15.5)
WBC: 7.6 10*3/uL (ref 4.0–10.5)

## 2015-07-05 LAB — SURGICAL PCR SCREEN
MRSA, PCR: NEGATIVE
Staphylococcus aureus: NEGATIVE

## 2015-07-05 NOTE — Pre-Procedure Instructions (Signed)
    Andrew Bates  07/05/2015   Your procedure is scheduled on Monday, January 16.  Report to [facility] Admitting at 6:30 A.M.                   Your surgery or procedure is scheduled for 9:30 AM.   Call this number if you have problems the morning of [phone]   Remember:  Do not eat food or drink liquids after midnight Sunday, January 15.  Take these medicines the morning of surgery with A SIP OF WATER : gabapentin (NEURONTIN), omeprazole (PRILOSEC),   simvastatin (ZOCOR).                Take if needed: acetaminophen (TYLENOL), LORazepam (ATIVAN), meclizine (ANTIVERT); may use inhaler if needed.                STOP taking Aspirin.   Do not wear jewelry, make-up or nail polish.  Do not wear lotions, powders, or perfumes.              Men may shave face and neck.  Do not bring valuables to the hospital.  [facility] is not responsible for any belongings or valuables.  Contacts, dentures or bridgework may not be worn into surgery.  Leave your suitcase in the car.  After surgery it may be brought to your room.  For patients admitted to the hospital, discharge time will be determined by your treatment team.  Special instructions:  Review  [place] - Preparing For Surgery.  Please read over the following fact sheets that you were given. Pain Booklet, Coughing and Deep Breathing, Blood Transfusion Information and Surgical Site Infection Prevention

## 2015-07-06 NOTE — Progress Notes (Signed)
Anesthesia Chart Review:  Pt is 70 year old male scheduled for C5-6, C6-7 ACDF on 07/09/2015 with Dr. Arnoldo Morale.   History includes former smoker, HLD, DVT '08, HTN, asthma, nephrolithiasis, peripheral neuropathy, GERD, OSA, PTSD, chronic vertigo with acute admission 02/25/15 (MRI/MRA unremarkable; suspected Meniere's disease). PCP is listed as Dr. Sharilyn Sites. Seen by cardiologist Dr. Dorris Carnes in 2008 for bradycardia and chest discomfort and had a normal stress test.   Meds include albuterol, Neurontin, Ativan, losartan, meclizine, Prilosec, Zocor,   Preoperative labs reviewed.    02/25/15 EKG: SB at 57 bpm, anteroseptal infarct (age undetermined). Anteroseptal changes appear new when compared to 02/14/14 and 12/20/06 EKGs. There is also a Q wave in lateral lead aVL. V1-V2 may now have an incomplete right BBB pattern or QR pattern.   11/27/09 Echo: Study Conclusions - Left ventricle: The cavity size was normal. Systolic function was normal. The estimated ejection fraction was in the range of 55% to 60%. Wall motion was normal; there were no regional wall motion abnormalities. - Aortic valve: Mild regurgitation.  12/22/06 Nuclear stress test: IMPRESSION: Normal stress Myoview. Ejection fraction 51%.  02/25/15 MRI Brain/MRA Head: IMPRESSION: Mild chronic microvascular ischemic changes are stable since 2011. No acute abnormality. Negative intracranial MRA.  See Ebony Hail Zelenak's note dated 04/06/15. Pt was originally scheduled for surgery 04/09/15 but it was cancelled due to insurance reasons. We reviewed the chart anyway, and pt was to get repeat EKG at PAT if surgery was rescheduled due to EKG changes. This was not done and pt will need new EKG DOS.   If EKG is acceptable DOS, I anticipate pt can proceed as scheduled.   Willeen Cass, FNP-BC [facility]/Anesthesiology Phone: [phone] 07/06/2015 3:09 PM

## 2015-07-09 ENCOUNTER — Encounter (HOSPITAL_COMMUNITY): Payer: Self-pay | Admitting: Surgery

## 2015-07-09 ENCOUNTER — Inpatient Hospital Stay (HOSPITAL_COMMUNITY): Payer: PPO

## 2015-07-09 ENCOUNTER — Inpatient Hospital Stay (HOSPITAL_COMMUNITY)
Admission: RE | Admit: 2015-07-09 | Discharge: 2015-07-10 | DRG: 473 | Disposition: A | Discharge status: Home / Self Care | Payer: PPO | Source: Ambulatory Visit | Attending: Neurosurgery | Admitting: Neurosurgery

## 2015-07-09 ENCOUNTER — Encounter (HOSPITAL_COMMUNITY)
Admission: RE | Disposition: A | Discharge status: Home / Self Care | Payer: Self-pay | Source: Ambulatory Visit | Attending: Neurosurgery

## 2015-07-09 ENCOUNTER — Inpatient Hospital Stay (HOSPITAL_COMMUNITY): Payer: PPO | Admitting: Emergency Medicine

## 2015-07-09 ENCOUNTER — Inpatient Hospital Stay (HOSPITAL_COMMUNITY): Payer: PPO | Admitting: Anesthesiology

## 2015-07-09 DIAGNOSIS — R42 Dizziness and giddiness: Secondary | ICD-10-CM | POA: Diagnosis not present

## 2015-07-09 DIAGNOSIS — Z79899 Other long term (current) drug therapy: Secondary | ICD-10-CM

## 2015-07-09 DIAGNOSIS — M4722 Other spondylosis with radiculopathy, cervical region: Secondary | ICD-10-CM | POA: Diagnosis not present

## 2015-07-09 DIAGNOSIS — Z87891 Personal history of nicotine dependence: Secondary | ICD-10-CM

## 2015-07-09 DIAGNOSIS — M50122 Cervical disc disorder at C5-C6 level with radiculopathy: Secondary | ICD-10-CM | POA: Diagnosis not present

## 2015-07-09 DIAGNOSIS — Z7982 Long term (current) use of aspirin: Secondary | ICD-10-CM | POA: Diagnosis not present

## 2015-07-09 DIAGNOSIS — I10 Essential (primary) hypertension: Secondary | ICD-10-CM | POA: Diagnosis present

## 2015-07-09 DIAGNOSIS — J45909 Unspecified asthma, uncomplicated: Secondary | ICD-10-CM | POA: Diagnosis present

## 2015-07-09 DIAGNOSIS — F4310 Post-traumatic stress disorder, unspecified: Secondary | ICD-10-CM | POA: Diagnosis present

## 2015-07-09 DIAGNOSIS — G4730 Sleep apnea, unspecified: Secondary | ICD-10-CM | POA: Diagnosis present

## 2015-07-09 DIAGNOSIS — Z885 Allergy status to narcotic agent status: Secondary | ICD-10-CM | POA: Diagnosis not present

## 2015-07-09 DIAGNOSIS — K219 Gastro-esophageal reflux disease without esophagitis: Secondary | ICD-10-CM | POA: Diagnosis present

## 2015-07-09 DIAGNOSIS — F419 Anxiety disorder, unspecified: Secondary | ICD-10-CM | POA: Diagnosis present

## 2015-07-09 DIAGNOSIS — M4802 Spinal stenosis, cervical region: Secondary | ICD-10-CM | POA: Diagnosis not present

## 2015-07-09 DIAGNOSIS — Z888 Allergy status to other drugs, medicaments and biological substances status: Secondary | ICD-10-CM

## 2015-07-09 DIAGNOSIS — G629 Polyneuropathy, unspecified: Secondary | ICD-10-CM | POA: Diagnosis present

## 2015-07-09 DIAGNOSIS — E785 Hyperlipidemia, unspecified: Secondary | ICD-10-CM | POA: Diagnosis not present

## 2015-07-09 DIAGNOSIS — M4322 Fusion of spine, cervical region: Secondary | ICD-10-CM | POA: Diagnosis not present

## 2015-07-09 DIAGNOSIS — M542 Cervicalgia: Secondary | ICD-10-CM | POA: Diagnosis not present

## 2015-07-09 DIAGNOSIS — Z419 Encounter for procedure for purposes other than remedying health state, unspecified: Secondary | ICD-10-CM

## 2015-07-09 DIAGNOSIS — M1990 Unspecified osteoarthritis, unspecified site: Secondary | ICD-10-CM | POA: Diagnosis not present

## 2015-07-09 HISTORY — PX: ANTERIOR CERVICAL DECOMP/DISCECTOMY FUSION: SHX1161

## 2015-07-09 SURGERY — ANTERIOR CERVICAL DECOMPRESSION/DISCECTOMY FUSION 2 LEVELS
Anesthesia: General | Site: Spine Cervical

## 2015-07-09 MED ORDER — ONDANSETRON HCL 4 MG/2ML IJ SOLN
INTRAMUSCULAR | Status: DC | PRN
  Administered 2015-07-09: 4 mg via INTRAVENOUS

## 2015-07-09 MED ORDER — ROCURONIUM BROMIDE 50 MG/5ML IV SOLN
INTRAVENOUS | Status: AC
  Filled 2015-07-09: qty 1

## 2015-07-09 MED ORDER — FENTANYL CITRATE (PF) 100 MCG/2ML IJ SOLN
INTRAMUSCULAR | Status: DC | PRN
  Administered 2015-07-09: 50 ug via INTRAVENOUS
  Administered 2015-07-09: 100 ug via INTRAVENOUS
  Administered 2015-07-09 (×2): 50 ug via INTRAVENOUS

## 2015-07-09 MED ORDER — PHENYLEPHRINE HCL 10 MG/ML IJ SOLN
INTRAMUSCULAR | Status: DC | PRN
  Administered 2015-07-09: 80 ug via INTRAVENOUS

## 2015-07-09 MED ORDER — PANTOPRAZOLE SODIUM 40 MG PO TBEC: 40.0000 mg | DELAYED_RELEASE_TABLET | Freq: Every day | ORAL | Status: DC

## 2015-07-09 MED ORDER — HYDROMORPHONE HCL 1 MG/ML IJ SOLN: 0.2500 mg | INTRAMUSCULAR | Status: DC | PRN

## 2015-07-09 MED ORDER — THROMBIN 5000 UNITS EX SOLR
CUTANEOUS | Status: DC | PRN
  Administered 2015-07-09 (×2): 5000 [IU] via TOPICAL

## 2015-07-09 MED ORDER — 0.9 % SODIUM CHLORIDE (POUR BTL) OPTIME
TOPICAL | Status: DC | PRN
  Administered 2015-07-09: 1000 mL

## 2015-07-09 MED ORDER — LIDOCAINE HCL (CARDIAC) 20 MG/ML IV SOLN
INTRAVENOUS | Status: AC
  Filled 2015-07-09: qty 5

## 2015-07-09 MED ORDER — BUPIVACAINE-EPINEPHRINE (PF) 0.5% -1:200000 IJ SOLN
INTRAMUSCULAR | Status: DC | PRN
  Administered 2015-07-09: 10 mL

## 2015-07-09 MED ORDER — LACTATED RINGERS IV SOLN: INTRAVENOUS | Status: DC

## 2015-07-09 MED ORDER — LIDOCAINE HCL (CARDIAC) 20 MG/ML IV SOLN
INTRAVENOUS | Status: DC | PRN
  Administered 2015-07-09: 80 mg via INTRAVENOUS

## 2015-07-09 MED ORDER — CYCLOBENZAPRINE HCL 10 MG PO TABS: 10.0000 mg | ORAL_TABLET | Freq: Three times a day (TID) | ORAL | Status: DC | PRN

## 2015-07-09 MED ORDER — CEFAZOLIN SODIUM-DEXTROSE 2-3 GM-% IV SOLR
2.0000 g | INTRAVENOUS | Status: AC
  Administered 2015-07-09: 2 g via INTRAVENOUS
  Filled 2015-07-09: qty 50

## 2015-07-09 MED ORDER — DEXAMETHASONE SODIUM PHOSPHATE 10 MG/ML IJ SOLN
INTRAMUSCULAR | Status: DC | PRN
  Administered 2015-07-09: 10 mg via INTRAVENOUS

## 2015-07-09 MED ORDER — SIMVASTATIN 40 MG PO TABS
40.0000 mg | ORAL_TABLET | Freq: Every morning | ORAL | Status: DC
  Filled 2015-07-09 (×2): qty 1

## 2015-07-09 MED ORDER — ALUM & MAG HYDROXIDE-SIMETH 200-200-20 MG/5ML PO SUSP: 30.0000 mL | Freq: Four times a day (QID) | ORAL | Status: DC | PRN

## 2015-07-09 MED ORDER — ONDANSETRON HCL 4 MG/2ML IJ SOLN
4.0000 mg | Freq: Once | INTRAMUSCULAR | Status: AC
  Administered 2015-07-09: 4 mg via INTRAVENOUS

## 2015-07-09 MED ORDER — DEXAMETHASONE SODIUM PHOSPHATE 4 MG/ML IJ SOLN
4.0000 mg | Freq: Four times a day (QID) | INTRAMUSCULAR | Status: AC
  Administered 2015-07-09 – 2015-07-10 (×3): 4 mg via INTRAVENOUS
  Filled 2015-07-09 (×3): qty 1

## 2015-07-09 MED ORDER — HYDROMORPHONE HCL 2 MG PO TABS: 4.0000 mg | ORAL_TABLET | ORAL | Status: DC | PRN

## 2015-07-09 MED ORDER — ONDANSETRON HCL 4 MG/2ML IJ SOLN
4.0000 mg | INTRAMUSCULAR | Status: DC | PRN
  Administered 2015-07-09: 4 mg via INTRAVENOUS
  Filled 2015-07-09: qty 2

## 2015-07-09 MED ORDER — PHENYLEPHRINE HCL 10 MG/ML IJ SOLN
10.0000 mg | INTRAVENOUS | Status: DC | PRN
  Administered 2015-07-09: 25 ug/min via INTRAVENOUS

## 2015-07-09 MED ORDER — DOCUSATE SODIUM 100 MG PO CAPS: 100.0000 mg | ORAL_CAPSULE | Freq: Two times a day (BID) | ORAL | Status: DC

## 2015-07-09 MED ORDER — PROPOFOL 10 MG/ML IV BOLUS
INTRAVENOUS | Status: AC
  Filled 2015-07-09: qty 20

## 2015-07-09 MED ORDER — PROPOFOL 10 MG/ML IV BOLUS
INTRAVENOUS | Status: DC | PRN
  Administered 2015-07-09: 200 mg via INTRAVENOUS

## 2015-07-09 MED ORDER — LOSARTAN POTASSIUM 25 MG PO TABS
12.5000 mg | ORAL_TABLET | Freq: Every morning | ORAL | Status: DC
  Filled 2015-07-09: qty 0.5

## 2015-07-09 MED ORDER — LORAZEPAM 0.5 MG PO TABS
0.5000 mg | ORAL_TABLET | Freq: Three times a day (TID) | ORAL | Status: DC | PRN
  Administered 2015-07-10: 0.5 mg via ORAL
  Filled 2015-07-09: qty 1

## 2015-07-09 MED ORDER — NORTRIPTYLINE HCL 10 MG PO CAPS
20.0000 mg | ORAL_CAPSULE | Freq: Every day | ORAL | Status: DC
  Filled 2015-07-09: qty 2

## 2015-07-09 MED ORDER — SODIUM CHLORIDE 0.9 % IR SOLN
Status: DC | PRN
  Administered 2015-07-09: 500 mL

## 2015-07-09 MED ORDER — GABAPENTIN 400 MG PO CAPS: 800.0000 mg | ORAL_CAPSULE | Freq: Three times a day (TID) | ORAL | Status: DC

## 2015-07-09 MED ORDER — HYDROMORPHONE HCL 1 MG/ML IJ SOLN
1.0000 mg | INTRAMUSCULAR | Status: DC | PRN
  Filled 2015-07-09: qty 1

## 2015-07-09 MED ORDER — PROMETHAZINE HCL 25 MG/ML IJ SOLN
12.5000 mg | Freq: Four times a day (QID) | INTRAMUSCULAR | Status: DC | PRN
  Administered 2015-07-09: 12.5 mg via INTRAVENOUS
  Filled 2015-07-09: qty 1

## 2015-07-09 MED ORDER — BACITRACIN ZINC 500 UNIT/GM EX OINT
TOPICAL_OINTMENT | CUTANEOUS | Status: DC | PRN
  Administered 2015-07-09: 1 via TOPICAL

## 2015-07-09 MED ORDER — GLYCOPYRROLATE 0.2 MG/ML IJ SOLN
INTRAMUSCULAR | Status: DC | PRN
  Administered 2015-07-09: 0.4 mg via INTRAVENOUS

## 2015-07-09 MED ORDER — MECLIZINE HCL 12.5 MG PO TABS
25.0000 mg | ORAL_TABLET | Freq: Three times a day (TID) | ORAL | Status: DC | PRN
  Administered 2015-07-09 – 2015-07-10 (×2): 25 mg via ORAL
  Filled 2015-07-09 (×2): qty 2

## 2015-07-09 MED ORDER — SUCCINYLCHOLINE CHLORIDE 20 MG/ML IJ SOLN
INTRAMUSCULAR | Status: AC
  Filled 2015-07-09: qty 1

## 2015-07-09 MED ORDER — DEXAMETHASONE 4 MG PO TABS: 4.0000 mg | ORAL_TABLET | Freq: Four times a day (QID) | ORAL | Status: AC

## 2015-07-09 MED ORDER — PHENOL 1.4 % MT LIQD: 1.0000 | OROMUCOSAL | Status: DC | PRN

## 2015-07-09 MED ORDER — DEXAMETHASONE SODIUM PHOSPHATE 10 MG/ML IJ SOLN
INTRAMUSCULAR | Status: AC
  Filled 2015-07-09: qty 1

## 2015-07-09 MED ORDER — ALBUTEROL SULFATE HFA 108 (90 BASE) MCG/ACT IN AERS: 2.0000 | INHALATION_SPRAY | Freq: Four times a day (QID) | RESPIRATORY_TRACT | Status: DC | PRN

## 2015-07-09 MED ORDER — NEOSTIGMINE METHYLSULFATE 10 MG/10ML IV SOLN
INTRAVENOUS | Status: DC | PRN
  Administered 2015-07-09: 3 mg via INTRAVENOUS

## 2015-07-09 MED ORDER — NAPHAZOLINE-GLYCERIN 0.012-0.2 % OP SOLN: 1.0000 [drp] | Freq: Four times a day (QID) | OPHTHALMIC | Status: DC | PRN

## 2015-07-09 MED ORDER — HEMOSTATIC AGENTS (NO CHARGE) OPTIME
TOPICAL | Status: DC | PRN
  Administered 2015-07-09: 1 via TOPICAL

## 2015-07-09 MED ORDER — ROCURONIUM BROMIDE 100 MG/10ML IV SOLN
INTRAVENOUS | Status: DC | PRN
  Administered 2015-07-09: 50 mg via INTRAVENOUS
  Administered 2015-07-09: 10 mg via INTRAVENOUS

## 2015-07-09 MED ORDER — ALBUTEROL SULFATE (2.5 MG/3ML) 0.083% IN NEBU: 2.5000 mg | INHALATION_SOLUTION | Freq: Four times a day (QID) | RESPIRATORY_TRACT | Status: DC | PRN

## 2015-07-09 MED ORDER — ONDANSETRON HCL 4 MG/2ML IJ SOLN
INTRAMUSCULAR | Status: AC
  Filled 2015-07-09: qty 2

## 2015-07-09 MED ORDER — PROMETHAZINE HCL 25 MG/ML IJ SOLN
INTRAMUSCULAR | Status: AC
  Filled 2015-07-09: qty 1

## 2015-07-09 MED ORDER — BISACODYL 10 MG RE SUPP: 10.0000 mg | Freq: Every day | RECTAL | Status: DC | PRN

## 2015-07-09 MED ORDER — ACETAMINOPHEN 650 MG RE SUPP: 650.0000 mg | RECTAL | Status: DC | PRN

## 2015-07-09 MED ORDER — FENTANYL CITRATE (PF) 250 MCG/5ML IJ SOLN
INTRAMUSCULAR | Status: AC
  Filled 2015-07-09: qty 5

## 2015-07-09 MED ORDER — SCOPOLAMINE 1 MG/3DAYS TD PT72
1.0000 | MEDICATED_PATCH | TRANSDERMAL | Status: DC
  Administered 2015-07-09: 1.5 mg via TRANSDERMAL
  Filled 2015-07-09: qty 1

## 2015-07-09 MED ORDER — ACETAMINOPHEN 325 MG PO TABS
650.0000 mg | ORAL_TABLET | ORAL | Status: DC | PRN
  Administered 2015-07-09 – 2015-07-10 (×2): 650 mg via ORAL
  Filled 2015-07-09 (×2): qty 2

## 2015-07-09 MED ORDER — PROMETHAZINE HCL 25 MG/ML IJ SOLN
6.2500 mg | INTRAMUSCULAR | Status: AC | PRN
  Administered 2015-07-09 (×2): 6.25 mg via INTRAVENOUS

## 2015-07-09 MED ORDER — MENTHOL 3 MG MT LOZG
1.0000 | LOZENGE | OROMUCOSAL | Status: DC | PRN
  Filled 2015-07-09: qty 9

## 2015-07-09 MED ORDER — MIDAZOLAM HCL 2 MG/2ML IJ SOLN
INTRAMUSCULAR | Status: AC
  Filled 2015-07-09: qty 2

## 2015-07-09 MED ORDER — LACTATED RINGERS IV SOLN
INTRAVENOUS | Status: DC
  Administered 2015-07-09 (×3): via INTRAVENOUS

## 2015-07-09 MED ORDER — CEFAZOLIN SODIUM-DEXTROSE 2-3 GM-% IV SOLR
2.0000 g | Freq: Three times a day (TID) | INTRAVENOUS | Status: AC
  Administered 2015-07-09 – 2015-07-10 (×2): 2 g via INTRAVENOUS
  Filled 2015-07-09 (×2): qty 50

## 2015-07-09 MED ORDER — ARTIFICIAL TEARS OP OINT
TOPICAL_OINTMENT | OPHTHALMIC | Status: DC | PRN
  Administered 2015-07-09: 1 via OPHTHALMIC

## 2015-07-09 SURGICAL SUPPLY — 63 items
APL SKNCLS STERI-STRIP NONHPOA (GAUZE/BANDAGES/DRESSINGS) ×1
BAG DECANTER FOR FLEXI CONT (MISCELLANEOUS) ×3 IMPLANT
BENZOIN TINCTURE PRP APPL 2/3 (GAUZE/BANDAGES/DRESSINGS) ×4 IMPLANT
BIT DRILL NEURO 2X3.1 SFT TUCH (MISCELLANEOUS) ×1 IMPLANT
BLADE SURG 15 STRL LF DISP TIS (BLADE) ×1 IMPLANT
BLADE SURG 15 STRL SS (BLADE) ×3
BLADE ULTRA TIP 2M (BLADE) ×3 IMPLANT
BRUSH SCRUB EZ PLAIN DRY (MISCELLANEOUS) ×3 IMPLANT
BUR BARREL STRAIGHT FLUTE 4.0 (BURR) ×3 IMPLANT
BUR MATCHSTICK NEURO 3.0 LAGG (BURR) ×3 IMPLANT
CANISTER SUCT 3000ML PPV (MISCELLANEOUS) ×3 IMPLANT
CLOSURE WOUND 1/2 X4 (GAUZE/BANDAGES/DRESSINGS) ×1
COVER MAYO STAND STRL (DRAPES) ×3 IMPLANT
DEVICE FUSION VIST S 14X14X6MM (Trauma) IMPLANT
DRAPE LAPAROTOMY 100X72 PEDS (DRAPES) ×3 IMPLANT
DRAPE MICROSCOPE LEICA (MISCELLANEOUS) IMPLANT
DRAPE POUCH INSTRU U-SHP 10X18 (DRAPES) ×3 IMPLANT
DRAPE SURG 17X23 STRL (DRAPES) ×6 IMPLANT
DRILL NEURO 2X3.1 SOFT TOUCH (MISCELLANEOUS) ×6
ELECT REM PT RETURN 9FT ADLT (ELECTROSURGICAL) ×3
ELECTRODE REM PT RTRN 9FT ADLT (ELECTROSURGICAL) ×1 IMPLANT
GAUZE SPONGE 4X4 12PLY STRL (GAUZE/BANDAGES/DRESSINGS) ×3 IMPLANT
GAUZE SPONGE 4X4 16PLY XRAY LF (GAUZE/BANDAGES/DRESSINGS) IMPLANT
GLOVE BIO SURGEON STRL SZ8 (GLOVE) ×3 IMPLANT
GLOVE BIO SURGEON STRL SZ8.5 (GLOVE) ×3 IMPLANT
GLOVE BIOGEL PI IND STRL 7.5 (GLOVE) IMPLANT
GLOVE BIOGEL PI INDICATOR 7.5 (GLOVE) ×4
GLOVE ECLIPSE 7.5 STRL STRAW (GLOVE) ×4 IMPLANT
GLOVE EXAM NITRILE LRG STRL (GLOVE) IMPLANT
GLOVE EXAM NITRILE MD LF STRL (GLOVE) IMPLANT
GLOVE EXAM NITRILE XL STR (GLOVE) IMPLANT
GLOVE EXAM NITRILE XS STR PU (GLOVE) IMPLANT
GOWN STRL REUS W/ TWL LRG LVL3 (GOWN DISPOSABLE) IMPLANT
GOWN STRL REUS W/ TWL XL LVL3 (GOWN DISPOSABLE) IMPLANT
GOWN STRL REUS W/TWL LRG LVL3 (GOWN DISPOSABLE)
GOWN STRL REUS W/TWL XL LVL3 (GOWN DISPOSABLE) ×6
KIT BASIN OR (CUSTOM PROCEDURE TRAY) ×3 IMPLANT
KIT ROOM TURNOVER OR (KITS) ×3 IMPLANT
MARKER SKIN DUAL TIP RULER LAB (MISCELLANEOUS) ×3 IMPLANT
NDL SPNL 18GX3.5 QUINCKE PK (NEEDLE) ×1 IMPLANT
NEEDLE HYPO 22GX1.5 SAFETY (NEEDLE) ×3 IMPLANT
NEEDLE SPNL 18GX3.5 QUINCKE PK (NEEDLE) ×3 IMPLANT
NS IRRIG 1000ML POUR BTL (IV SOLUTION) ×3 IMPLANT
PACK LAMINECTOMY NEURO (CUSTOM PROCEDURE TRAY) ×3 IMPLANT
PATTIES SURGICAL .5 X.5 (GAUZE/BANDAGES/DRESSINGS) ×2 IMPLANT
PATTIES SURGICAL 1X1 (DISPOSABLE) ×2 IMPLANT
PEEK VISTA 14X14X7MM (Peek) ×2 IMPLANT
PIN DISTRACTION 14MM (PIN) ×6 IMPLANT
PLATE ANT CERV XTEND 2 LV 30 (Plate) ×2 IMPLANT
PUTTY BIOACTIVE 5CC KINEX (Putty) ×2 IMPLANT
RUBBERBAND STERILE (MISCELLANEOUS) IMPLANT
SCREW XTD VAR 4.2 SELF TAP (Screw) ×12 IMPLANT
SPONGE INTESTINAL PEANUT (DISPOSABLE) ×4 IMPLANT
SPONGE SURGIFOAM ABS GEL SZ50 (HEMOSTASIS) ×3 IMPLANT
STRIP CLOSURE SKIN 1/2X4 (GAUZE/BANDAGES/DRESSINGS) ×2 IMPLANT
SUT VIC AB 0 CT1 27 (SUTURE) ×3
SUT VIC AB 0 CT1 27XBRD ANTBC (SUTURE) ×1 IMPLANT
SUT VIC AB 3-0 SH 8-18 (SUTURE) ×3 IMPLANT
TAPE CLOTH SURG 4X10 WHT LF (GAUZE/BANDAGES/DRESSINGS) ×2 IMPLANT
TOWEL OR 17X24 6PK STRL BLUE (TOWEL DISPOSABLE) ×3 IMPLANT
TOWEL OR 17X26 10 PK STRL BLUE (TOWEL DISPOSABLE) ×3 IMPLANT
VISTA S O 14X14X6MM (Trauma) ×3 IMPLANT
WATER STERILE IRR 1000ML POUR (IV SOLUTION) ×3 IMPLANT

## 2015-07-09 NOTE — Anesthesia Procedure Notes (Signed)
Procedure Name: Intubation Date/Time: 07/09/2015 10:32 AM Performed by: Trixie Deis A Pre-anesthesia Checklist: Patient identified, Emergency Drugs available, Suction available, Patient being monitored and Timeout performed Patient Re-evaluated:Patient Re-evaluated prior to inductionOxygen Delivery Method: Circle system utilized Preoxygenation: Pre-oxygenation with 100% oxygen Intubation Type: IV induction Ventilation: Mask ventilation without difficulty Laryngoscope Size: Mac and 4 Grade View: Grade I Tube type: Oral Tube size: 7.5 mm Number of attempts: 1 Airway Equipment and Method: Stylet Placement Confirmation: ETT inserted through vocal cords under direct vision,  positive ETCO2 and breath sounds checked- equal and bilateral Secured at: 23 cm Tube secured with: Tape Dental Injury: Teeth and Oropharynx as per pre-operative assessment

## 2015-07-09 NOTE — Op Note (Signed)
Brief history: The patient is a 70 year old white male who has complained of neck and arm pain consistent with a cervical radiculopathy. He has failed medical management worked up with a cervical MRI. This demonstrated spondylosis, hernia disc, stenosis, etc. at C5-6 and C6-7. I discussed the situation with the patient and reviewed his imaging studies with him. We discussed the various treatment options. He has decided to proceed with a C5-6 anterior cervical discectomy, fusion, and plating after weighing the risks, benefits, and alternatives to surgery.  Preoperative diagnosis: C5-6 and C6-7 disc degeneration, spondylosis, herniated disc, cervicalgia, cervical radiculopathy  Postoperative diagnosis: The same  Procedure: C5-6 and C6-7 Anterior cervical discectomy/decompression; C5-6 and C6-7 interbody arthrodesis with local morcellized autograft bone and Kinnex bone graft extender; insertion of interbody prosthesis at C5-6 and C6-7 (Zimmer peek interbody prosthesis); anterior cervical plating from C5-C7 with globus titanium plate  Surgeon: Dr. Earle Gell  Asst.: Dr. Kristeen Miss  Anesthesia: Gen. endotracheal  Estimated blood loss: 125 mL  Drains: None  Complications: None  Description of procedure: The patient was brought to the operating room by the anesthesia team. General endotracheal anesthesia was induced. A roll was placed under the patient's shoulders to keep the neck in the neutral position. The patient's anterior cervical region was then prepared with Betadine scrub and Betadine solution. Sterile drapes were applied.  The area to be incised was then injected with Marcaine with epinephrine solution. I then used a scalpel to make a transverse incision in the patient's left anterior neck. I used the Metzenbaum scissors to divide the platysmal muscle and then to dissect medial to the sternocleidomastoid muscle, jugular vein, and carotid artery. I carefully dissected down towards the  anterior cervical spine identifying the esophagus and retracting it medially. Then using Kitner swabs to clear soft tissue from the anterior cervical spine. We then inserted a bent spinal needle into the upper exposed intervertebral disc space. We then obtained intraoperative radiographs confirm our location.  I then used electrocautery to detach the medial border of the longus colli muscle bilaterally from the C5-6 and C6-7 intervertebral disc spaces. I then inserted the Caspar self-retaining retractor underneath the longus colli muscle bilaterally to provide exposure.  We then incised the intervertebral disc at C5-6 . We then performed a partial intervertebral discectomy with a pituitary forceps and the Karlin curettes. I then inserted distraction screws into the vertebral bodies at C5-6. We then distracted the interspace. We then used the high-speed drill to decorticate the vertebral endplates at 075-GRM, to drill away the remainder of the intervertebral disc, to drill away some posterior spondylosis, and to thin out the posterior longitudinal ligament. I then incised ligament with the arachnoid knife. We then removed the ligament with a Kerrison punches undercutting the vertebral endplates and decompressing the thecal sac. We then performed foraminotomies about the bilateral C6 nerve roots. This completed the decompression at this level.  We then repeated this procedure in an analogous fashion at C6-7 decompressing the thecal sac and the bilateral C7 nerve roots.  We now turned our to attention to the interbody fusion. We used the trial spacers to determine the appropriate size for the interbody prosthesis. We then pre-filled prosthesis with a combination of local morcellized autograft bone that we obtained during decompression as well as Kinnex bone graft extender. We then inserted the prosthesis into the distracted interspace at C5-6 and C6-7. We then removed the distraction screws. There was a good snug  fit of the prosthesis in the interspace.  Having completed the fusion we now turned attention to the anterior spinal instrumentation. We used the high-speed drill to drill away some anterior spondylosis at the disc spaces so that the plate lay down flat. We selected the appropriate length titanium anterior cervical plate. We laid it along the anterior aspect of the vertebral bodies from C5-C7. We then drilled 14 mm holes at C5, C6 and C7. We then secured the plate to the vertebral bodies by placing two 14 mm self-tapping screws at C5, C6 and C7. We then obtained intraoperative radiograph. The demonstrating good position of the instrumentation. We therefore secured the screws the plate the locking each cam. This completed the instrumentation.  We then obtained hemostasis using bipolar electrocautery. We irrigated the wound out with bacitracin solution. We then removed the retractor. We inspected the esophagus for any damage. There was none apparent. We then reapproximated patient's platysmal muscle with interrupted 3-0 Vicryl suture. We then reapproximated the subcutaneous tissue with interrupted 3-0 Vicryl suture. The skin was reapproximated with Steri-Strips and benzoin. The wound was then covered with bacitracin ointment. A sterile dressing was applied. The drapes were removed. Patient was subsequently extubated by the anesthesia team and transported to the post anesthesia care unit in stable condition. All sponge instrument and needle counts were reportedly correct at the end of this case.

## 2015-07-09 NOTE — Anesthesia Postprocedure Evaluation (Signed)
Anesthesia Post Note  Patient: Andrew Bates  Procedure(s) Performed: Procedure(s) (LRB): ANTERIOR CERVICAL DECOMPRESSION/DISCECTOMY FUSION CERVICAL FIVE-SIX,CERVICAL SIX-SEVEN (N/A)  Patient location during evaluation: PACU Anesthesia Type: General Level of consciousness: sedated Pain management: pain level controlled Vital Signs Assessment: post-procedure vital signs reviewed and stable Respiratory status: spontaneous breathing and respiratory function stable Cardiovascular status: stable Anesthetic complications: no    Last Vitals:  Filed Vitals:   07/09/15 1501 07/09/15 1505  BP:  136/80  Pulse: 79 77  Temp:    Resp: 17 13    Last Pain: There were no vitals filed for this visit.               [name] DANIEL

## 2015-07-09 NOTE — Anesthesia Preprocedure Evaluation (Addendum)
Anesthesia Evaluation  Patient identified by MRN, date of birth, ID band Patient awake    Reviewed: Allergy & Precautions, NPO status , Patient's Chart, lab work & pertinent test results  History of Anesthesia Complications Negative for: history of anesthetic complications  Airway Mallampati: II  TM Distance: >3 FB Neck ROM: Full    Dental  (+) Teeth Intact, Dental Advisory Given, Caps   Pulmonary asthma , sleep apnea , former smoker,    Pulmonary exam normal        Cardiovascular hypertension, Pt. on medications Normal cardiovascular exam  11/27/09 Echo: Study Conclusions - Left ventricle: The cavity size was normal. Systolic function was normal. The estimated ejection fraction was in the range of 55% to 60%. Wall motion was normal; there were no regional wall motion abnormalities. - Aortic valve: Mild regurgitation.    Neuro/Psych PSYCHIATRIC DISORDERS Anxiety    GI/Hepatic Neg liver ROS, GERD  ,  Endo/Other  negative endocrine ROS  Renal/GU      Musculoskeletal   Abdominal   Peds  Hematology   Anesthesia Other Findings   Reproductive/Obstetrics                          Anesthesia Physical Anesthesia Plan  ASA: II  Anesthesia Plan: General   Post-op Pain Management:    Induction: Intravenous  Airway Management Planned: Oral ETT  Additional Equipment:   Intra-op Plan:   Post-operative Plan: Extubation in OR  Informed Consent: I have reviewed the patients History and Physical, chart, labs and discussed the procedure including the risks, benefits and alternatives for the proposed anesthesia with the patient or authorized representative who has indicated his/her understanding and acceptance.   Dental advisory given  Plan Discussed with: CRNA, Anesthesiologist and Surgeon  Anesthesia Plan Comments:        Anesthesia Quick Evaluation

## 2015-07-09 NOTE — Transfer of Care (Signed)
Immediate Anesthesia Transfer of Care Note  Patient: Andrew Bates  Procedure(s) Performed: Procedure(s): ANTERIOR CERVICAL DECOMPRESSION/DISCECTOMY FUSION CERVICAL FIVE-SIX,CERVICAL SIX-SEVEN (N/A)  Patient Location: PACU  Anesthesia Type:General  Level of Consciousness: awake, alert , oriented and patient cooperative  Airway & Oxygen Therapy: Patient Spontanous Breathing and Patient connected to nasal cannula oxygen  Post-op Assessment: Report given to RN, Post -op Vital signs reviewed and stable, Patient moving all extremities X 4 and Patient able to stick tongue midline  Post vital signs: Reviewed and stable  Last Vitals:  Filed Vitals:   07/09/15 0713 07/09/15 1403  BP: 159/103 150/104  Pulse: 59 97  Temp: 36.3 C   Resp: 20 23    Complications: No apparent anesthesia complications

## 2015-07-09 NOTE — H&P (Signed)
Subjective: The patient is a 70 year old white male who has complained of neck and left shoulder and arm pain. He has failed medical management and was worked up with a cervical MRI. This demonstrated spondylosis and stenosis at C5-6 and C6-7. I discussed the situation with the patient. We discussed the various treatment options including surgery. He has decided to proceed with a C5-6 and C6-7 anterior cervical discectomy, fusion, and plating.   Past Medical History  Diagnosis Date  . Adenomatous colon polyp   . Internal hemorrhoids   . Hyperlipidemia     takes Simvastatin daily  . Vertigo   . DVT (deep venous thrombosis) ([place]) 2008    takes Aspirin daily.  "They though it was in heart.  Had a stress tes  . GERD (gastroesophageal reflux disease)   . Post traumatic stress disorder (PTSD)   . Asthma     Albuterol daily as needed  . Hypertension     takes Losartan daily  . Anxiety     Ativan daily as needed  . Complication of anesthesia 2015    low blood pressure, slow to awaken with cystoscopy with stents  . History of kidney stones   . Sleep apnea   . Peripheral neuropathy ([facility])     feet  . Arthritis     Past Surgical History  Procedure Laterality Date  . Right knee surgery Right 2006    arthroscopy- MCl tear  . Carpal tunnel release Bilateral 2014  . Cystoscopy/retrograde/ureteroscopy/stone extraction with basket Left 02/14/2014    Procedure: CYSTOSCOPY AND LEFT DOUBLE J STENT PLACEMENT;  ATTEMPTED STONE  EXTRACTION ;  Surgeon: Jorja Loa, MD;  Location: [facility];  Service: Urology;  Laterality: Left;  . Esophagogastroduodenoscopy endoscopy    . Colonoscopy      Allergies  Allergen Reactions  . Other Nausea And Vomiting and Other (See Comments)    "STRONG" PAIN MEDICATIONS IN GENERAL: HEADACHE  . Tamsulosin Anaphylaxis  . Codeine Nausea And Vomiting    REACTION: Headache  . Morphine And Related Nausea And Vomiting  . Pentazocine Nausea And Vomiting and Other (See  Comments)    Headaches  . Sertraline Swelling    Swelling in knee 100 mg  . Topiramate Other (See Comments)    Headache    Social History  Substance Use Topics  . Smoking status: Former Smoker    Types: Cigars  . Smokeless tobacco: Never Used     Comment: quit 15 years ago (2017) never smoked alot  . Alcohol Use: No    Family History  Problem Relation Age of Onset  . Hypertension Father   . Hyperlipidemia Father   . Lung cancer Father     from piece of abestos lodged in his lung  . Thyroid cancer Mother   . Colon cancer Brother 33  . Bladder Cancer Brother    Prior to Admission medications   Medication Sig Start Date End Date Taking? Authorizing Provider  acetaminophen (TYLENOL) 500 MG tablet Take 500 mg by mouth every 6 (six) hours as needed for mild pain or moderate pain.   Yes Historical Provider, MD  albuterol (PROVENTIL HFA;VENTOLIN HFA) 108 (90 BASE) MCG/ACT inhaler Inhale 2 puffs into the lungs every 6 (six) hours as needed for wheezing or shortness of breath.   Yes Historical Provider, MD  aspirin EC 81 MG tablet Take 81 mg by mouth every morning.   Yes Historical Provider, MD  gabapentin (NEURONTIN) 400 MG capsule Take 800 mg by  mouth 3 (three) times daily.    Yes Historical Provider, MD  LORazepam (ATIVAN) 0.5 MG tablet Take 1 tablet (0.5 mg total) by mouth every 8 (eight) hours as needed for anxiety. 02/27/15  Yes Kinnie Feil, MD  losartan (COZAAR) 25 MG tablet Take 12.5 mg by mouth every morning.    Yes Historical Provider, MD  meclizine (ANTIVERT) 25 MG tablet Take 25 mg by mouth every 8 (eight) hours as needed for dizziness.   Yes Historical Provider, MD  nortriptyline (PAMELOR) 10 MG capsule Take 20 mg by mouth at bedtime.   Yes Historical Provider, MD  omeprazole (PRILOSEC) 20 MG capsule Take 20 mg by mouth every morning.    Yes Historical Provider, MD  simvastatin (ZOCOR) 40 MG tablet Take 40 mg by mouth every morning.    Yes Historical Provider, MD   tetrahydrozoline-zinc (VISINE-AC) 0.05-0.25 % ophthalmic solution Place 2 drops into both eyes 3 (three) times daily as needed.   Yes Historical Provider, MD     Review of Systems  Positive ROS: As above  All other systems have been reviewed and were otherwise negative with the exception of those mentioned in the HPI and as above.  Objective: Vital signs in last 24 hours: Temp:  [97.3 F (36.3 C)] 97.3 F (36.3 C) (01/16 0713) Pulse Rate:  [59] 59 (01/16 0713) Resp:  [20] 20 (01/16 0713) BP: (159)/(103) 159/103 mmHg (01/16 0713) SpO2:  [99 %] 99 % (01/16 0713) Weight:  [95.709 kg (211 lb)] 95.709 kg (211 lb) (01/16 0713)  General Appearance: Alert, cooperative, no distress, Head: Normocephalic, without obvious abnormality, atraumatic Eyes: PERRL, conjunctiva/corneas clear, EOM's intact,    Ears: Normal  Throat: Normal  Neck: Supple, symmetrical, trachea midline, no adenopathy; thyroid: No enlargement/tenderness/nodules; no carotid bruit or JVD Back: Symmetric, no curvature, ROM normal, no CVA tenderness Lungs: Clear to auscultation bilaterally, respirations unlabored Heart: Regular rate and rhythm, no murmur, rub or gallop Abdomen: Soft, non-tender,, no masses, no organomegaly Extremities: Extremities normal, atraumatic, no cyanosis or edema Pulses: 2+ and symmetric all extremities Skin: Skin color, texture, turgor normal, no rashes or lesions  NEUROLOGIC:   Mental status: alert and oriented, no aphasia, good attention span, Fund of knowledge/ memory ok Motor Exam - grossly normal Sensory Exam - grossly normal Reflexes:  Coordination - grossly normal Gait - grossly normal Balance - grossly normal Cranial Nerves: I: smell Not tested  II: visual acuity  OS: Normal  OD: Normal   II: visual fields Full to confrontation  II: pupils Equal, round, reactive to light  III,VII: ptosis None  III,IV,VI: extraocular muscles  Full ROM  V: mastication Normal  V: facial light  touch sensation  Normal  V,VII: corneal reflex  Present  VII: facial muscle function - upper  Normal  VII: facial muscle function - lower Normal  VIII: hearing Not tested  IX: soft palate elevation  Normal  IX,X: gag reflex Present  XI: trapezius strength  5/5  XI: sternocleidomastoid strength 5/5  XI: neck flexion strength  5/5  XII: tongue strength  Normal    Data Review Lab Results  Component Value Date   WBC 7.6 07/05/2015   HGB 16.5 07/05/2015   HCT 48.9 07/05/2015   MCV 97.2 07/05/2015   PLT 231 07/05/2015   Lab Results  Component Value Date   NA 141 07/05/2015   K 4.5 07/05/2015   CL 104 07/05/2015   CO2 26 07/05/2015   BUN 11 07/05/2015   CREATININE  1.22 07/05/2015   GLUCOSE 104* 07/05/2015   Lab Results  Component Value Date   INR 1.01 02/25/2015    Assessment/Plan: D56 and C6-7 disc degeneration, spondylosis, herniated disc, cervicalgia, cervical radiculopathy: I have discussed the situation with the patient and reviewed his MRI scan with him. We have discussed the various treatment options including surgery. I have described the surgical treatment option of C5-6 and C6-7 anterior cervical discectomy, fusion, and plating. I have shown him surgical models. We have discussed the risks, benefits, alternatives, and likelihood of achieving goals with surgery. I have answered all the patient's questions. He has decided to proceed with surgery.   Jedaiah Rathbun D 07/09/2015 9:56 AM

## 2015-07-09 NOTE — Progress Notes (Signed)
Subjective:  The patient is somnolent but arousable. He is in no apparent distress.  Objective: Vital signs in last 24 hours: Temp:  [97.3 F (36.3 C)] 97.3 F (36.3 C) (01/16 0713) Pulse Rate:  [59-97] 97 (01/16 1403) Resp:  [20-23] 23 (01/16 1403) BP: (150-159)/(103-104) 150/104 mmHg (01/16 1403) SpO2:  [95 %-99 %] 95 % (01/16 1403) Weight:  [95.709 kg (211 lb)] 95.709 kg (211 lb) (01/16 0713)  Intake/Output from previous day:   Intake/Output this shift: Total I/O In: 1500 [I.V.:1500] Out: 300 [Blood:300]  Physical exam the patient is somnolent but arousable. He moves all 4 extremities. His dressing is clean and dry. There is no evidence of hematoma or shift.  Lab Results: No results for input(s): WBC, HGB, HCT, PLT in the last 72 hours. BMET No results for input(s): NA, K, CL, CO2, GLUCOSE, BUN, CREATININE, CALCIUM in the last 72 hours.  Studies/Results: Dg Cervical Spine 2-3 Views  07/09/2015  CLINICAL DATA:  Anterior cervical fusion EXAM: CERVICAL SPINE - 2-3 VIEW COMPARISON:  None. FINDINGS: Two intraoperative cross-table lateral cervical spine x-rays are provided. The first image demonstrates a anterior metallic needle tip projecting over the C3-4 disc space. The second image demonstrates anterior cervical fusion hardware transfixing the C5-7. IMPRESSION: Anterior cervical disc fusion from C5-C7. Electronically Signed   By: Kathreen Devoid   On: 07/09/2015 13:49    Assessment/Plan: The patient is doing well.  LOS: 0 days     Kenlee Vogt D 07/09/2015, 2:07 PM

## 2015-07-10 ENCOUNTER — Encounter (HOSPITAL_COMMUNITY): Payer: Self-pay | Admitting: Neurosurgery

## 2015-07-10 MED ORDER — CYCLOBENZAPRINE HCL 10 MG PO TABS: 10.0000 mg | ORAL_TABLET | Freq: Three times a day (TID) | ORAL | Status: DC | PRN

## 2015-07-10 MED ORDER — HYDROMORPHONE HCL 4 MG PO TABS: 4.0000 mg | ORAL_TABLET | ORAL | Status: DC | PRN

## 2015-07-10 MED ORDER — DOCUSATE SODIUM 100 MG PO CAPS: 100.0000 mg | ORAL_CAPSULE | Freq: Two times a day (BID) | ORAL | Status: DC

## 2015-07-10 NOTE — Discharge Summary (Signed)
Physician Discharge Summary  Patient ID: Andrew Bates MRN: [id number] DOB/AGE: 1945/12/21 70 y.o.  Admit date: 07/09/2015 Discharge date: 07/10/2015  Admission Diagnoses: C5-6 and C6-7 disc degeneration, spondylosis, herniated disc, cervicalgia, cervical radiculopathy  Discharge Diagnoses: The same and vertigo Active Problems:   Cervical spondylosis with radiculopathy   Discharged Condition: good  [facility] a C5-6 and C6-7 anterior cervical discectomy, fusion, and plating on the patient on 07/09/2015. The surgery went well.  The patient's postoperative course was remarkable only for an exacerbation of his chronic vertigo. Otherwise the patient felt well and requested discharge to home. The patient, and his wife, were given oral and written discharge instructions. All other questions were answered.  Consults:None Significant Diagnostic Sn[place] Treatments:C5-6 and C6-7 anterior cervical discectomy, fusion, and plating.ge Exam: Blood pressure 136/78, pulse 95, temperature 98.7 F (37.1 C), temperature source Oral, resp. rate 20, height 6\' 2"  (1.88 m), weight 95.709 kg (211 lb), SpO2 95 %. the patient is alert and pleasant. He looks well. His strength is normal. His dressing is clean and dry. There is no hematoma or shift. Disposition:Home  Discharge Instructions    Call MD for:  difficulty breathing, headache or visual disturbances    Complete by:  As directed      Call MD for:  extreme fatigue    Complete by:  As directed      Call MD for:  hives    Complete by:  As directed      Call MD for:  persistant dizziness or light-headedness    Complete by:  As directed      Call MD for:  persistant nausea and vomiting    Complete by:  As directed      Call MD for:  redness, tenderness, or signs of infection (pain, swelling, redness, odor or green/yellow discharge around incision site)    Complete by:  As directed      Call MD for:  severe uncontrolled pain    Complete  by:  As directed      Call MD for:  temperature >100.4    Complete by:  As directed      Diet - low sodium heart healthy    Complete by:  As directed      Discharge instructions    Complete by:  As directed   Call [phone] for a followup appointment. Take a stool softener while you are using pain medications.     Driving Restrictions    Complete by:  As directed   Do not drive for 2 weeks.     Increase activity slowly    Complete by:  As directed      Lifting restrictions    Complete by:  As directed   Do not lift more than 5 pounds. No excessive bending or twisting.     May shower / Bathe    Complete by:  As directed   He may shower after the pain she is removed 3 days after surgery. Leave the incision alone.     Remove dressing in 48 hours    Complete by:  As directed   Your stitches are under the scan and will dissolve by themselves. The Steri-Strips will fall off after you take a few showers. Do not rub back or pick at the wound, Leave the wound alone.            Medication List    STOP taking these medications  acetaminophen 500 MG tablet  Commonly known as:  TYLENOL      TAKE these medications        albuterol 108 (90 Base) MCG/ACT inhaler  Commonly known as:  PROVENTIL HFA;VENTOLIN HFA  Inhale 2 puffs into the lungs every 6 (six) hours as needed for wheezing or shortness of breath.     aspirin EC 81 MG tablet  Take 81 mg by mouth every morning.     cyclobenzaprine 10 MG tablet  Commonly known as:  FLEXERIL  Take 1 tablet (10 mg total) by mouth 3 (three) times daily as needed for muscle spasms.     docusate sodium 100 MG capsule  Commonly known as:  COLACE  Take 1 capsule (100 mg total) by mouth 2 (two) times daily.     gabapentin 400 MG capsule  Commonly known as:  NEURONTIN  Take 800 mg by mouth 3 (three) times daily.     HYDROmorphone 4 MG tablet  Commonly known as:  DILAUDID  Take 1 tablet (4 mg total) by mouth every 4 (four) hours as  needed for moderate pain or severe pain.     LORazepam 0.5 MG tablet  Commonly known as:  ATIVAN  Take 1 tablet (0.5 mg total) by mouth every 8 (eight) hours as needed for anxiety.     losartan 25 MG tablet  Commonly known as:  COZAAR  Take 12.5 mg by mouth every morning.     meclizine 25 MG tablet  Commonly known as:  ANTIVERT  Take 25 mg by mouth every 8 (eight) hours as needed for dizziness.     nortriptyline 10 MG capsule  Commonly known as:  PAMELOR  Take 20 mg by mouth at bedtime.     omeprazole 20 MG capsule  Commonly known as:  PRILOSEC  Take 20 mg by mouth every morning.     simvastatin 40 MG tablet  Commonly known as:  ZOCOR  Take 40 mg by mouth every morning.     tetrahydrozoline-zinc 0.05-0.25 % ophthalmic solution  Commonly known as:  VISINE-AC  Place 2 drops into both eyes 3 (three) times daily as needed.         SignedNewman Pies D 07/10/2015, 7:40 AM

## 2015-07-10 NOTE — Progress Notes (Signed)
Pt's vertigo has improved and he is ready for D/C home. Pt and wife given D/C instructions with Rx's, verbal understanding. Pt's incision is clean and dry with no sign of infection. Pt's IV was removed prior to D/C. Pt D/C'd home via wheelchair @ 1130 per MD order. Pt is stable @ D/C and has no other needs at this time. Holli Humbles, RN

## 2015-07-10 NOTE — Progress Notes (Signed)
Utilization review completed.  

## 2015-07-31 DIAGNOSIS — M4722 Other spondylosis with radiculopathy, cervical region: Secondary | ICD-10-CM | POA: Diagnosis not present

## 2015-08-06 NOTE — Therapy (Signed)
[facility] [place], [place], [address] Phone: [phone]   Fax:  [phone]  Patient Details  Name: Andrew Bates MRN: [id number] Date of Birth: 06-Mar-1946 Referring Provider:  Sharilyn Sites, MD  Encounter Date: 08/06/2015   PHYSICAL THERAPY DISCHARGE SUMMARY  Visits from Start of Care: 10  Current functional level related to goals / functional outcomes: Has not returned since last skilled session   Remaining deficits: Unable to assess    Education / Equipment: N/A  Plan: Patient agrees to discharge.  Patient goals were partially met. Patient is being discharged due to not returning since the last visit.  ?????       Deniece Ree PT, DPT [facility] [address] [place], [place], [address] Phone: [phone]   Fax:  [phone]

## 2015-09-11 DIAGNOSIS — R972 Elevated prostate specific antigen [PSA]: Secondary | ICD-10-CM | POA: Diagnosis not present

## 2015-10-26 DIAGNOSIS — H5201 Hypermetropia, right eye: Secondary | ICD-10-CM | POA: Diagnosis not present

## 2015-10-26 DIAGNOSIS — H52221 Regular astigmatism, right eye: Secondary | ICD-10-CM | POA: Diagnosis not present

## 2015-10-26 DIAGNOSIS — H18413 Arcus senilis, bilateral: Secondary | ICD-10-CM | POA: Diagnosis not present

## 2015-10-26 DIAGNOSIS — H524 Presbyopia: Secondary | ICD-10-CM | POA: Diagnosis not present

## 2015-10-30 DIAGNOSIS — Z6827 Body mass index (BMI) 27.0-27.9, adult: Secondary | ICD-10-CM | POA: Diagnosis not present

## 2015-10-30 DIAGNOSIS — M542 Cervicalgia: Secondary | ICD-10-CM | POA: Diagnosis not present

## 2016-03-18 DIAGNOSIS — F4310 Post-traumatic stress disorder, unspecified: Secondary | ICD-10-CM | POA: Diagnosis not present

## 2016-03-18 DIAGNOSIS — E663 Overweight: Secondary | ICD-10-CM | POA: Diagnosis not present

## 2016-03-18 DIAGNOSIS — Z6826 Body mass index (BMI) 26.0-26.9, adult: Secondary | ICD-10-CM | POA: Diagnosis not present

## 2016-03-18 DIAGNOSIS — Z1389 Encounter for screening for other disorder: Secondary | ICD-10-CM | POA: Diagnosis not present

## 2016-03-18 DIAGNOSIS — F419 Anxiety disorder, unspecified: Secondary | ICD-10-CM | POA: Diagnosis not present

## 2016-03-18 DIAGNOSIS — Z23 Encounter for immunization: Secondary | ICD-10-CM | POA: Diagnosis not present

## 2016-04-22 ENCOUNTER — Other Ambulatory Visit (HOSPITAL_COMMUNITY)
Admission: RE | Admit: 2016-04-22 | Discharge: 2016-04-22 | Disposition: A | Discharge status: Home / Self Care | Payer: PPO | Source: Ambulatory Visit | Attending: Urology | Admitting: Urology

## 2016-04-22 ENCOUNTER — Ambulatory Visit (INDEPENDENT_AMBULATORY_CARE_PROVIDER_SITE_OTHER): Payer: PPO | Admitting: Urology

## 2016-04-22 DIAGNOSIS — N5201 Erectile dysfunction due to arterial insufficiency: Secondary | ICD-10-CM | POA: Diagnosis not present

## 2016-04-22 DIAGNOSIS — N200 Calculus of kidney: Secondary | ICD-10-CM

## 2016-04-22 DIAGNOSIS — R300 Dysuria: Secondary | ICD-10-CM | POA: Diagnosis not present

## 2016-04-25 LAB — URINE CULTURE

## 2016-04-29 DIAGNOSIS — Z6826 Body mass index (BMI) 26.0-26.9, adult: Secondary | ICD-10-CM | POA: Diagnosis not present

## 2016-04-29 DIAGNOSIS — R42 Dizziness and giddiness: Secondary | ICD-10-CM | POA: Diagnosis not present

## 2016-04-29 DIAGNOSIS — M542 Cervicalgia: Secondary | ICD-10-CM | POA: Diagnosis not present

## 2016-05-06 ENCOUNTER — Encounter (HOSPITAL_COMMUNITY): Payer: Self-pay | Admitting: Emergency Medicine

## 2016-05-06 ENCOUNTER — Emergency Department (HOSPITAL_COMMUNITY)
Admission: EM | Admit: 2016-05-06 | Discharge: 2016-05-06 | Disposition: A | Discharge status: Home / Self Care | Payer: PPO | Attending: Emergency Medicine | Admitting: Emergency Medicine

## 2016-05-06 DIAGNOSIS — Z79899 Other long term (current) drug therapy: Secondary | ICD-10-CM | POA: Insufficient documentation

## 2016-05-06 DIAGNOSIS — Z7982 Long term (current) use of aspirin: Secondary | ICD-10-CM | POA: Insufficient documentation

## 2016-05-06 DIAGNOSIS — Z87891 Personal history of nicotine dependence: Secondary | ICD-10-CM | POA: Diagnosis not present

## 2016-05-06 DIAGNOSIS — J45909 Unspecified asthma, uncomplicated: Secondary | ICD-10-CM | POA: Diagnosis not present

## 2016-05-06 DIAGNOSIS — I10 Essential (primary) hypertension: Secondary | ICD-10-CM | POA: Diagnosis not present

## 2016-05-06 DIAGNOSIS — R42 Dizziness and giddiness: Secondary | ICD-10-CM | POA: Diagnosis not present

## 2016-05-06 MED ORDER — DIAZEPAM 5 MG/ML IJ SOLN
10.0000 mg | Freq: Once | INTRAMUSCULAR | Status: AC
  Administered 2016-05-06: 10 mg via INTRAMUSCULAR
  Filled 2016-05-06: qty 2

## 2016-05-06 MED ORDER — ONDANSETRON 8 MG PO TBDP
8.0000 mg | ORAL_TABLET | Freq: Once | ORAL | Status: AC
  Administered 2016-05-06: 8 mg via ORAL
  Filled 2016-05-06: qty 1

## 2016-05-06 MED ORDER — ONDANSETRON HCL 8 MG PO TABS: 8.0000 mg | ORAL_TABLET | Freq: Three times a day (TID) | ORAL | 0 refills | Status: DC | PRN

## 2016-05-06 MED ORDER — DIAZEPAM 10 MG PO TABS: 10.0000 mg | ORAL_TABLET | Freq: Three times a day (TID) | ORAL | 0 refills | Status: DC | PRN

## 2016-05-06 NOTE — ED Triage Notes (Signed)
Pt reports vertigo x 1 month. Pt states he has had hx of same. Pt states his symptoms have not subsided and he is having to sit up to sleep.

## 2016-05-06 NOTE — ED Notes (Signed)
Pt states he has been dx with menieres dz and vertigo in the past. Has been having dizziness X4 weeks, is currently being tx for a UTI X1 week. States N/, decreased appetite, and difficulty changing positions.

## 2016-05-06 NOTE — ED Provider Notes (Signed)
[facility] DEPT Provider Note   CSN: [id number] Arrival date & time: 05/06/16  1624     History   Chief Complaint Chief Complaint  Patient presents with  . Dizziness    HPI Andrew Bates is a 70 y.o. male.  He presents for evaluation of "vertigo and dizziness." He describes ongoing symptoms for many years, and has seen numerous physicians and multiple specialists, for the same problem. He is currently taking Ativan and an unknown suppository for nausea. His symptom complex tends to be vertigo, described as a spinning sensation which occurs 4 hours, or a full day, followed by a dizziness or lightheaded feeling, which lasts, for several days. Occasionally has associated headache and changes in vision, which are transient. His wife states that occasionally his lips turn blue when he has this and his fingers turn white. He has not had syncope, near syncope, paresthesia or focal weakness, chest pain or shortness of breath with the episodes. When he has periods of dizziness, it is worse when supine. He is here now because he did not know where else to go for his ongoing symptoms. He is currently on an antibiotic, prolonged treatment for several weeks for recurrent urinary tract infection. He denies fever at this time. He denies blood in urine, at this time. There are no other known modifying factors.    HPI  Past Medical History:  Diagnosis Date  . Adenomatous colon polyp   . Anxiety    Ativan daily as needed  . Arthritis   . Asthma    Albuterol daily as needed  . Complication of anesthesia 2015   low blood pressure, slow to awaken with cystoscopy with stents  . DVT (deep venous thrombosis) ([place]) 2008   takes Aspirin daily.  "They though it was in heart.  Had a stress tes  . GERD (gastroesophageal reflux disease)   . History of kidney stones   . Hyperlipidemia    takes Simvastatin daily  . Hypertension    takes Losartan daily  . Internal hemorrhoids   . Peripheral  neuropathy (HCC)    feet  . Post traumatic stress disorder (PTSD)   . Sleep apnea   . Vertigo     Patient Active Problem List   Diagnosis Date Noted  . Cervical spondylosis with radiculopathy 07/09/2015  . Urinary frequency 02/25/2015  . Dehydration 02/25/2015  . Severe vertigo 02/25/2015  . Hydronephrosis 02/15/2014  . Acute renal failure ([place]) 02/15/2014  . Calculus of ureter 02/14/2014  . Urinary hesitancy 11/17/2013  . Overweight(278.02) 11/17/2013  . Acute pyelonephritis 11/16/2013  . Kidney stones 11/16/2013  . Acute onset of severe vertigo 11/16/2013  . Sepsis secondary to UTI ([place]) 11/16/2013  . Hypertension 11/16/2013  . Hyperlipemia 11/16/2013  . GERD (gastroesophageal reflux disease) 11/16/2013    Past Surgical History:  Procedure Laterality Date  . ANTERIOR CERVICAL DECOMP/DISCECTOMY FUSION N/A 07/09/2015   Procedure: ANTERIOR CERVICAL DECOMPRESSION/DISCECTOMY FUSION CERVICAL FIVE-SIX,CERVICAL SIX-SEVEN;  Surgeon: Newman Pies, MD;  Location: [facility];  Service: Neurosurgery;  Laterality: N/A;  . CARPAL TUNNEL RELEASE Bilateral 2014  . COLONOSCOPY    . CYSTOSCOPY/RETROGRADE/URETEROSCOPY/STONE EXTRACTION WITH BASKET Left 02/14/2014   Procedure: CYSTOSCOPY AND LEFT DOUBLE J STENT PLACEMENT;  ATTEMPTED STONE  EXTRACTION ;  Surgeon: Jorja Loa, MD;  Location: [facility];  Service: Urology;  Laterality: Left;  . ESOPHAGOGASTRODUODENOSCOPY ENDOSCOPY    . right knee surgery Right 2006   arthroscopy- MCl tear       Home Medications  Prior to Admission medications   Medication Sig Start Date End Date Taking? Authorizing Provider  albuterol (PROVENTIL HFA;VENTOLIN HFA) 108 (90 BASE) MCG/ACT inhaler Inhale 2 puffs into the lungs every 6 (six) hours as needed for wheezing or shortness of breath.    Historical Provider, MD  aspirin EC 81 MG tablet Take 81 mg by mouth every morning.    Historical Provider, MD  diazepam (VALIUM) 10 MG tablet Take 1 tablet (10  mg total) by mouth every 8 (eight) hours as needed (dizziness). 05/06/16   [name], MD  docusate sodium (COLACE) 100 MG capsule Take 1 capsule (100 mg total) by mouth 2 (two) times daily. 07/10/15   Newman Pies, MD  gabapentin (NEURONTIN) 400 MG capsule Take 800 mg by mouth 3 (three) times daily.     Historical Provider, MD  HYDROmorphone (DILAUDID) 4 MG tablet Take 1 tablet (4 mg total) by mouth every 4 (four) hours as needed for moderate pain or severe pain. 07/10/15   Newman Pies, MD  losartan (COZAAR) 25 MG tablet Take 12.5 mg by mouth every morning.     Historical Provider, MD  nortriptyline (PAMELOR) 10 MG capsule Take 20 mg by mouth at bedtime.    Historical Provider, MD  omeprazole (PRILOSEC) 20 MG capsule Take 20 mg by mouth every morning.     Historical Provider, MD  ondansetron (ZOFRAN) 8 MG tablet Take 1 tablet (8 mg total) by mouth every 8 (eight) hours as needed for nausea or vomiting. 05/06/16   [name], MD  simvastatin (ZOCOR) 40 MG tablet Take 40 mg by mouth every morning.     Historical Provider, MD  tetrahydrozoline-zinc (VISINE-AC) 0.05-0.25 % ophthalmic solution Place 2 drops into both eyes 3 (three) times daily as needed.    Historical Provider, MD    Family History Family History  Problem Relation Age of Onset  . Hypertension Father   . Hyperlipidemia Father   . Lung cancer Father     from piece of abestos lodged in his lung  . Thyroid cancer Mother   . Colon cancer Brother 20  . Bladder Cancer Brother     Social History Social History  Substance Use Topics  . Smoking status: Former Smoker    Types: Cigars  . Smokeless tobacco: Never Used     Comment: quit 15 years ago (2017) never smoked alot  . Alcohol use No     Allergies   Other; Tamsulosin; Codeine; Morphine and related; Pentazocine; Sertraline; and Topiramate   Review of Systems Review of Systems  All other systems reviewed and are negative.    Physical Exam Updated Vital  Signs BP 138/79 (BP Location: Right Arm)   Pulse 65   Temp 98 F (36.7 C) (Oral)   Resp 17   Ht 6\' 2"  (1.88 m)   Wt 203 lb (92.1 kg)   SpO2 99%   BMI 26.06 kg/m   Physical Exam  Constitutional: He is oriented to person, place, and time. He appears well-developed and well-nourished. No distress.  HENT:  Head: Normocephalic and atraumatic.  Right Ear: External ear normal.  Left Ear: External ear normal.  Eyes: Conjunctivae and EOM are normal. Pupils are equal, round, and reactive to light.  Neck: Normal range of motion and phonation normal. Neck supple.  Cardiovascular: Normal rate, regular rhythm and normal heart sounds.   Pulmonary/Chest: Effort normal and breath sounds normal. He exhibits no bony tenderness.  Abdominal: Soft. There is no tenderness.  Musculoskeletal: Normal  range of motion.  Neurological: He is alert and oriented to person, place, and time. No cranial nerve deficit or sensory deficit. He exhibits normal muscle tone. Coordination normal.  No dysarthria or aphasia. Mild bilateral nystagmus, with limited eye movement. He resists head impulse testing secondary to causing vertigo. He walks with a normal gait.  Skin: Skin is warm, dry and intact.  Psychiatric: He has a normal mood and affect. His behavior is normal. Judgment and thought content normal.  Nursing note and vitals reviewed.    ED Treatments / Results  Labs (all labs ordered are listed, but only abnormal results are displayed) Labs Reviewed - No data to display  EKG  EKG Interpretation None       Radiology No results found.  Procedures Procedures (including critical care time)  Medications Ordered in ED Medications  diazepam (VALIUM) injection 10 mg (10 mg Intramuscular Given 05/06/16 1955)  ondansetron (ZOFRAN-ODT) disintegrating tablet 8 mg (8 mg Oral Given 05/06/16 1955)     Initial Impression / Assessment and Plan / ED Course  I have reviewed the triage vital signs and the nursing  notes.  Pertinent labs & imaging results that were available during my care of the patient were reviewed by me and considered in my medical decision making (see chart for details).  Clinical Course     Medications  diazepam (VALIUM) injection 10 mg (10 mg Intramuscular Given 05/06/16 1955)  ondansetron (ZOFRAN-ODT) disintegrating tablet 8 mg (8 mg Oral Given 05/06/16 1955)    Patient Vitals for the past 24 hrs:  BP Temp Temp src Pulse Resp SpO2 Height Weight  05/06/16 1910 138/79 - - 65 17 99 % - -  05/06/16 1702 - - - - - - 6\' 2"  (1.88 m) 203 lb (92.1 kg)  05/06/16 1701 141/86 98 F (36.7 C) Oral 62 20 100 % - -    7:49 PM Reevaluation with update and discussion. After initial assessment and treatment, an updated evaluation reveals No change in clinical status. Findings discussed with patient and wife, all questions were answered. Voyd Groft L    Final Clinical Impressions(s) / ED Diagnoses   Final diagnoses:  Dizziness    Nonspecific dizziness, with complex and varied symptoms. Symptoms present for several years. Doubt acute CVA, metabolic instability or infectious process.  Nursing Notes Reviewed/ Care Coordinated Applicable Imaging Reviewed Interpretation of Laboratory Data incorporated into ED treatment  The patient appears reasonably screened and/or stabilized for discharge and I doubt any other medical condition or other [facility] requiring further screening, evaluation, or treatment in the ED at this time prior to discharge.  Plan: Home Medications- stop Ativan, and antiemetic suppository for now; Home Treatments- rest, fluids; return here if the recommended treatment, does not improve the symptoms; Recommended follow up- PCP, cardiology, ENT, neurology, for definitive diagnosis and treatment.       New Prescriptions New Prescriptions   DIAZEPAM (VALIUM) 10 MG TABLET    Take 1 tablet (10 mg total) by mouth every 8 (eight) hours as needed (dizziness).   ONDANSETRON  (ZOFRAN) 8 MG TABLET    Take 1 tablet (8 mg total) by mouth every 8 (eight) hours as needed for nausea or vomiting.     [name], MD 05/06/16 2013

## 2016-05-06 NOTE — Discharge Instructions (Signed)
Consider following up with your Neurologist for additional diagnostic evaluation and treatment.

## 2016-05-20 ENCOUNTER — Other Ambulatory Visit: Payer: Self-pay | Admitting: Urology

## 2016-05-20 DIAGNOSIS — N200 Calculus of kidney: Secondary | ICD-10-CM

## 2016-05-20 DIAGNOSIS — R300 Dysuria: Secondary | ICD-10-CM

## 2016-05-22 ENCOUNTER — Ambulatory Visit (HOSPITAL_COMMUNITY)
Admission: RE | Admit: 2016-05-22 | Discharge: 2016-05-22 | Disposition: A | Discharge status: Home / Self Care | Payer: PPO | Source: Ambulatory Visit | Attending: Urology | Admitting: Urology

## 2016-05-22 DIAGNOSIS — N200 Calculus of kidney: Secondary | ICD-10-CM | POA: Diagnosis not present

## 2016-05-22 DIAGNOSIS — R300 Dysuria: Secondary | ICD-10-CM

## 2016-05-28 ENCOUNTER — Other Ambulatory Visit (HOSPITAL_COMMUNITY)
Admission: RE | Admit: 2016-05-28 | Discharge: 2016-05-28 | Disposition: A | Discharge status: Home / Self Care | Payer: PPO | Source: Ambulatory Visit | Attending: Urology | Admitting: Urology

## 2016-05-28 DIAGNOSIS — N201 Calculus of ureter: Secondary | ICD-10-CM | POA: Diagnosis not present

## 2016-05-28 LAB — URINALYSIS, ROUTINE W REFLEX MICROSCOPIC
BILIRUBIN URINE: NEGATIVE
GLUCOSE, UA: NEGATIVE mg/dL
HGB URINE DIPSTICK: NEGATIVE
Ketones, ur: NEGATIVE mg/dL
Leukocytes, UA: NEGATIVE
Nitrite: NEGATIVE
Protein, ur: NEGATIVE mg/dL
SPECIFIC GRAVITY, URINE: 1.019 (ref 1.005–1.030)
pH: 6 (ref 5.0–8.0)

## 2016-06-06 ENCOUNTER — Encounter: Payer: Self-pay | Admitting: Cardiology

## 2016-06-06 NOTE — Progress Notes (Signed)
Cardiology Office Note  Date: 06/09/2016   ID: Andrew Bates, DOB January 18, 1946, MRN [id number]  PCP: Purvis Kilts, MD  Consulting Cardiologist: Rozann Lesches, MD   Chief Complaint  Patient presents with  . History of vertigo  . Chest Pain    History of Present Illness: Andrew Bates is a 70 y.o. male referred for cardiology consultation after recent ER visit with chronic description of recurring dizziness and vertigo. He is here today with his wife. We discussed his symptoms. He has been experiencing bouts of vertigo that sound fairly incapacitating and associated with nausea, last anywhere from a few days to several weeks. It sounds like he has had an extensive workup for this involving both ENT and neurology, was seen at [facility]. He mentions several different diagnoses that have been considered. These episodes are not predictable. They do not occur in association with palpitations, he has never had syncope. He reports that the most recent episode is now resolving and he is feeling better.  Unrelated to the above symptoms he also describes feeling recent episodes of chest tightness. This can occur in the morning, also if he is physically active or "overdoes it." He has felt this intermittently over the last few months. Also having worsening reflux symptoms. He is on a proton pump inhibitor.  I reviewed his ECG today which shows sinus rhythm with IVCD and nonspecific T-wave changes. He has not worn a cardiac monitor in the past. Last ischemic workup was about 10 years ago.  Past Medical History:  Diagnosis Date  . Adenomatous colon polyp   . Anxiety   . Arthritis   . Asthma   . DVT (deep venous thrombosis) ([place]) 2008  . Essential hypertension   . GERD (gastroesophageal reflux disease)   . History of kidney stones   . Hyperlipidemia   . Internal hemorrhoids   . Peripheral neuropathy ([place])   . Post traumatic stress disorder (PTSD)   . Sleep apnea   . Vertigo     Past  Surgical History:  Procedure Laterality Date  . ANTERIOR CERVICAL DECOMP/DISCECTOMY FUSION N/A 07/09/2015   Procedure: ANTERIOR CERVICAL DECOMPRESSION/DISCECTOMY FUSION CERVICAL FIVE-SIX,CERVICAL SIX-SEVEN;  Surgeon: Newman Pies, MD;  Location: [facility];  Service: Neurosurgery;  Laterality: N/A;  . CARPAL TUNNEL RELEASE Bilateral 2014  . COLONOSCOPY    . CYSTOSCOPY/RETROGRADE/URETEROSCOPY/STONE EXTRACTION WITH BASKET Left 02/14/2014   Procedure: CYSTOSCOPY AND LEFT DOUBLE J STENT PLACEMENT;  ATTEMPTED STONE  EXTRACTION ;  Surgeon: Jorja Loa, MD;  Location: [facility];  Service: Urology;  Laterality: Left;  . ESOPHAGOGASTRODUODENOSCOPY ENDOSCOPY    . KNEE SURGERY Right 2006   Arthroscopy - MCl tear    Current Outpatient Prescriptions  Medication Sig Dispense Refill  . albuterol (PROVENTIL HFA;VENTOLIN HFA) 108 (90 BASE) MCG/ACT inhaler Inhale 2 puffs into the lungs every 6 (six) hours as needed for wheezing or shortness of breath.    Marland Kitchen aspirin EC 81 MG tablet Take 81 mg by mouth every morning.    . diazepam (VALIUM) 10 MG tablet Take 1 tablet (10 mg total) by mouth every 8 (eight) hours as needed (dizziness). 20 tablet 0  . docusate sodium (COLACE) 100 MG capsule Take 1 capsule (100 mg total) by mouth 2 (two) times daily. 60 capsule 0  . gabapentin (NEURONTIN) 400 MG capsule Take 800 mg by mouth 3 (three) times daily.     Marland Kitchen HYDROmorphone (DILAUDID) 4 MG tablet Take 1 tablet (4 mg total) by mouth  every 4 (four) hours as needed for moderate pain or severe pain. 50 tablet 0  . losartan (COZAAR) 25 MG tablet Take 12.5 mg by mouth every morning.     . nortriptyline (PAMELOR) 10 MG capsule Take 20 mg by mouth at bedtime.    Marland Kitchen omeprazole (PRILOSEC) 20 MG capsule Take 20 mg by mouth every morning.     . ondansetron (ZOFRAN) 8 MG tablet Take 1 tablet (8 mg total) by mouth every 8 (eight) hours as needed for nausea or vomiting. 20 tablet 0  . simvastatin (ZOCOR) 40 MG tablet Take 40 mg by  mouth every morning.     Marland Kitchen tetrahydrozoline-zinc (VISINE-AC) 0.05-0.25 % ophthalmic solution Place 2 drops into both eyes 3 (three) times daily as needed.     No current facility-administered medications for this visit.    Allergies:  Other; Tamsulosin; Codeine; Morphine and related; Pentazocine; Sertraline; and Topiramate   Social History: The patient  reports that he has quit smoking. His smoking use included Cigars. He has never used smokeless tobacco. He reports that he does not drink alcohol or use drugs.   Family History: The patient's family history includes Bladder Cancer in his brother; Colon cancer (age of onset: 89) in his brother; Hyperlipidemia in his father; Hypertension in his father; Lung cancer in his father; Thyroid cancer in his mother.   ROS:  Please see the history of present illness. Otherwise, complete review of systems is positive for reported trouble with left rotator cuff.  All other systems are reviewed and negative.   Physical Exam: VS:  BP 126/82   Pulse 77   Ht 6\' 2"  (1.88 m)   Wt 197 lb (89.4 kg)   SpO2 97%   BMI 25.29 kg/m , BMI Body mass index is 25.29 kg/m.  Wt Readings from Last 3 Encounters:  06/09/16 197 lb (89.4 kg)  05/06/16 203 lb (92.1 kg)  07/09/15 211 lb (95.7 kg)    General: Patient appears comfortable at rest. HEENT: Conjunctiva and lids normal, oropharynx clear. Neck: Supple, no elevated JVP or carotid bruits, no thyromegaly. Lungs: Clear to auscultation, nonlabored breathing at rest. Cardiac: Regular rate and rhythm, S4, no significant systolic murmur, no pericardial rub. Abdomen: Soft, nontender, bowel sounds present, no guarding or rebound. Extremities: No pitting edema, distal pulses 2+. Skin: Warm and dry. Musculoskeletal: No kyphosis. Neuropsychiatric: Alert and oriented x3, affect grossly appropriate.  ECG: I personally reviewed the tracing from 02/25/2015 which showed sinus rhythm with R' lead V1 and V2, nondiagnostic septal Q  waves.  Recent Labwork: 07/05/2015: BUN 11; Creatinine, Ser 1.22; Hemoglobin 16.5; Platelets 231; Potassium 4.5; Sodium 141   Other Studies Reviewed Today:  Exercise Cardiolite 12/22/2006: STRESS MYOVIEW:  Patient exercised 10 minutes on standard [facility] protocol. Exercise stopped due to fatigue. Maximum heart rate is 140. Peak blood pressure 200/72.   Resting EKG was normal with borderline LVH with stress. There was no ischemia or arrhythmia.   SCINTIGRAPHIC RESULTS: Images reconstructed in the short axis, vertical, and horizontal long axis. Resting and stress images were normal. There is no evidence of ischemia or infarction.   IMPRESSION:  Normal stress Myoview. Ejection fraction 51%.  Echocardiogram 11/27/2009: Study Conclusions  - Left ventricle: The cavity size was normal. Systolic function was  normal. The estimated ejection fraction was in the range of 55% to  60%. Wall motion was normal; there were no regional wall motion  abnormalities. - Aortic valve: Mild regurgitation.  Assessment and Plan:  1.  Precordial pain, describes a tightness with some relation to activity, notable over the last few months. Also worsening reflux symptoms however. Cardio risk factors include age and gender, also hypertension and hyperlipidemia. ECG abnormal but overall nonspecific. He has not undergone an ischemic or cardiac structural workup in the last 7-10 years. Plan will be to obtain an echocardiogram and an exercise Cardiolite for follow-up assessment.  2. Chronic recurrent vertigo, symptoms do not sound cardiac in etiology. He has never worn a cardiac monitor, this could be considered with a more acute episode to be complete.  3. Essential hypertension, blood pressure adequately controlled today. He is on Cozaar.  4. Hyperlipidemia, on Zocor.  Current medicines were reviewed with the patient today.   Orders Placed This Encounter  Procedures  . NM Myocar Multi W/Spect W/Wall Motion /  EF  . EKG 12-Lead  . ECHOCARDIOGRAM COMPLETE    Disposition: Call with test results.  Signed, Satira Sark, MD, [facility] 06/09/2016 8:46 AM    [facility] [place]. [address] [place], [place] [address] Phone: [phone]; Fax: [phone]

## 2016-06-09 ENCOUNTER — Encounter: Payer: Self-pay | Admitting: Cardiology

## 2016-06-09 ENCOUNTER — Encounter: Payer: Self-pay | Admitting: *Deleted

## 2016-06-09 ENCOUNTER — Ambulatory Visit (INDEPENDENT_AMBULATORY_CARE_PROVIDER_SITE_OTHER): Payer: PPO | Admitting: Cardiology

## 2016-06-09 VITALS — BP 126/82 | HR 77 | Ht 74.0 in | Wt 197.0 lb

## 2016-06-09 DIAGNOSIS — R42 Dizziness and giddiness: Secondary | ICD-10-CM

## 2016-06-09 DIAGNOSIS — E782 Mixed hyperlipidemia: Secondary | ICD-10-CM | POA: Diagnosis not present

## 2016-06-09 DIAGNOSIS — R9431 Abnormal electrocardiogram [ECG] [EKG]: Secondary | ICD-10-CM

## 2016-06-09 DIAGNOSIS — R072 Precordial pain: Secondary | ICD-10-CM | POA: Diagnosis not present

## 2016-06-09 DIAGNOSIS — I10 Essential (primary) hypertension: Secondary | ICD-10-CM | POA: Diagnosis not present

## 2016-06-09 NOTE — Patient Instructions (Signed)
Your physician recommends that you schedule a follow-up appointment (We will call you with test results and follow-up instructions).    Your physician recommends that you continue on your current medications as directed. Please refer to the Current Medication list given to you today.  Your physician has requested that you have an echocardiogram. Echocardiography is a painless test that uses sound waves to create images of your heart. It provides your doctor with information about the size and shape of your heart and how well your heart's chambers and valves are working. This procedure takes approximately one hour. There are no restrictions for this procedure.  Your physician has requested that you have en exercise stress myoview. For further information please visit [email or web address]. Please follow instruction sheet, as given.  If you need a refill on your cardiac medications before your next appointment, please call your pharmacy.  Thank you for choosing [facility]!

## 2016-06-13 ENCOUNTER — Encounter (HOSPITAL_COMMUNITY): Payer: Self-pay

## 2016-06-13 ENCOUNTER — Inpatient Hospital Stay (HOSPITAL_COMMUNITY): Admission: RE | Admit: 2016-06-13 | Payer: PPO | Source: Ambulatory Visit

## 2016-06-13 ENCOUNTER — Ambulatory Visit (HOSPITAL_COMMUNITY)
Admission: RE | Admit: 2016-06-13 | Discharge: 2016-06-13 | Disposition: A | Discharge status: Home / Self Care | Payer: PPO | Source: Ambulatory Visit | Attending: Cardiology | Admitting: Cardiology

## 2016-06-13 ENCOUNTER — Encounter (HOSPITAL_COMMUNITY)
Admission: RE | Admit: 2016-06-13 | Discharge: 2016-06-13 | Disposition: A | Discharge status: Home / Self Care | Payer: PPO | Source: Ambulatory Visit | Attending: Cardiology | Admitting: Cardiology

## 2016-06-13 DIAGNOSIS — R072 Precordial pain: Secondary | ICD-10-CM | POA: Diagnosis not present

## 2016-06-13 DIAGNOSIS — I340 Nonrheumatic mitral (valve) insufficiency: Secondary | ICD-10-CM | POA: Insufficient documentation

## 2016-06-13 LAB — ECHOCARDIOGRAM COMPLETE
AVLVOTPG: 2 mmHg
CHL CUP RV SYS PRESS: 31 mmHg
CHL CUP STROKE VOLUME: 45 mL
EERAT: 8.44
EWDT: 229 ms
FS: 26 % — AB (ref 28–44)
IVS/LV PW RATIO, ED: 1.11
LA ID, A-P, ES: 31 mm
LA vol: 61.4 mL
LADIAMINDEX: 1.43 cm/m2
LAVOLA4C: 56.3 mL
LAVOLIN: 28.3 mL/m2
LEFT ATRIUM END SYS DIAM: 31 mm
LV PW d: 10 mm — AB (ref 0.6–1.1)
LV SIMPSON'S DISK: 54
LV TDI E'MEDIAL: 6.42
LV dias vol index: 38 mL/m2
LV dias vol: 82 mL (ref 62–150)
LV e' LATERAL: 6.85 cm/s
LVEEAVG: 8.44
LVEEMED: 8.44
LVOT SV: 65 mL
LVOT VTI: 18.9 cm
LVOT area: 3.46 cm2
LVOT diameter: 21 mm
LVOT peak vel: 76 cm/s
LVSYSVOL: 38 mL (ref 21–61)
LVSYSVOLIN: 17 mL/m2
MV Dec: 229
MVPKAVEL: 65.5 m/s
MVPKEVEL: 57.8 m/s
RV LATERAL S' VELOCITY: 11.3 cm/s
RV TAPSE: 20 mm
Reg peak vel: 264 cm/s
TDI e' lateral: 6.85
TRMAXVEL: 264 cm/s

## 2016-06-13 LAB — NM MYOCAR MULTI W/SPECT W/WALL MOTION / EF
CHL CUP MPHR: 151 {beats}/min
CHL CUP NUCLEAR SSS: 9
CHL CUP RESTING HR STRESS: 61 {beats}/min
CHL RATE OF PERCEIVED EXERTION: 14
CSEPED: 5 min
CSEPEDS: 1 s
CSEPHR: 93 %
Estimated workload: 7 METS
LHR: 0.33
LV sys vol: 43 mL
LVDIAVOL: 96 mL (ref 62–150)
Peak HR: 141 {beats}/min
SDS: 7
SRS: 2
TID: 0.88

## 2016-06-13 MED ORDER — SODIUM CHLORIDE 0.9% FLUSH
INTRAVENOUS | Status: AC
  Administered 2016-06-13: 10 mL via INTRAVENOUS
  Filled 2016-06-13: qty 10

## 2016-06-13 MED ORDER — TECHNETIUM TC 99M TETROFOSMIN IV KIT
10.0000 | PACK | Freq: Once | INTRAVENOUS | Status: AC | PRN
  Administered 2016-06-13: 10 via INTRAVENOUS

## 2016-06-13 MED ORDER — REGADENOSON 0.4 MG/5ML IV SOLN
INTRAVENOUS | Status: DC
Start: 2016-06-13 — End: 2016-06-13
  Filled 2016-06-13: qty 5

## 2016-06-13 MED ORDER — TECHNETIUM TC 99M TETROFOSMIN IV KIT
30.0000 | PACK | Freq: Once | INTRAVENOUS | Status: AC | PRN
  Administered 2016-06-13: 30 via INTRAVENOUS

## 2016-06-13 NOTE — Progress Notes (Signed)
*  PRELIMINARY RESULTS* Echocardiogram 2D Echocardiogram has been performed.  Samuel Germany 06/13/2016, 9:14 AM

## 2016-06-27 DIAGNOSIS — N400 Enlarged prostate without lower urinary tract symptoms: Secondary | ICD-10-CM | POA: Diagnosis not present

## 2016-06-27 DIAGNOSIS — E663 Overweight: Secondary | ICD-10-CM | POA: Diagnosis not present

## 2016-06-27 DIAGNOSIS — F419 Anxiety disorder, unspecified: Secondary | ICD-10-CM | POA: Diagnosis not present

## 2016-06-27 DIAGNOSIS — G64 Other disorders of peripheral nervous system: Secondary | ICD-10-CM | POA: Diagnosis not present

## 2016-06-27 DIAGNOSIS — Z1389 Encounter for screening for other disorder: Secondary | ICD-10-CM | POA: Diagnosis not present

## 2016-06-27 DIAGNOSIS — E782 Mixed hyperlipidemia: Secondary | ICD-10-CM | POA: Diagnosis not present

## 2016-06-27 DIAGNOSIS — G4700 Insomnia, unspecified: Secondary | ICD-10-CM | POA: Diagnosis not present

## 2016-06-27 DIAGNOSIS — I7300 Raynaud's syndrome without gangrene: Secondary | ICD-10-CM | POA: Diagnosis not present

## 2016-06-27 DIAGNOSIS — R7309 Other abnormal glucose: Secondary | ICD-10-CM | POA: Diagnosis not present

## 2016-06-27 DIAGNOSIS — Z6827 Body mass index (BMI) 27.0-27.9, adult: Secondary | ICD-10-CM | POA: Diagnosis not present

## 2016-07-28 DIAGNOSIS — G4730 Sleep apnea, unspecified: Secondary | ICD-10-CM | POA: Diagnosis not present

## 2016-07-28 DIAGNOSIS — G4700 Insomnia, unspecified: Secondary | ICD-10-CM | POA: Diagnosis not present

## 2016-08-14 ENCOUNTER — Ambulatory Visit (INDEPENDENT_AMBULATORY_CARE_PROVIDER_SITE_OTHER): Payer: PPO | Admitting: Otolaryngology

## 2016-08-14 DIAGNOSIS — R42 Dizziness and giddiness: Secondary | ICD-10-CM | POA: Diagnosis not present

## 2016-08-14 DIAGNOSIS — H903 Sensorineural hearing loss, bilateral: Secondary | ICD-10-CM

## 2016-08-15 DIAGNOSIS — H35033 Hypertensive retinopathy, bilateral: Secondary | ICD-10-CM | POA: Diagnosis not present

## 2016-10-21 DIAGNOSIS — R7309 Other abnormal glucose: Secondary | ICD-10-CM | POA: Diagnosis not present

## 2016-10-21 DIAGNOSIS — E785 Hyperlipidemia, unspecified: Secondary | ICD-10-CM | POA: Diagnosis not present

## 2016-10-21 DIAGNOSIS — G450 Vertebro-basilar artery syndrome: Secondary | ICD-10-CM | POA: Diagnosis not present

## 2016-10-21 DIAGNOSIS — E291 Testicular hypofunction: Secondary | ICD-10-CM | POA: Diagnosis not present

## 2016-10-21 DIAGNOSIS — E782 Mixed hyperlipidemia: Secondary | ICD-10-CM | POA: Diagnosis not present

## 2016-10-21 DIAGNOSIS — Z6826 Body mass index (BMI) 26.0-26.9, adult: Secondary | ICD-10-CM | POA: Diagnosis not present

## 2016-10-21 DIAGNOSIS — Z0001 Encounter for general adult medical examination with abnormal findings: Secondary | ICD-10-CM | POA: Diagnosis not present

## 2016-11-04 ENCOUNTER — Other Ambulatory Visit (HOSPITAL_COMMUNITY): Payer: Self-pay | Admitting: Family Medicine

## 2016-11-10 ENCOUNTER — Other Ambulatory Visit (HOSPITAL_COMMUNITY): Payer: Self-pay | Admitting: Family Medicine

## 2016-11-10 DIAGNOSIS — F17210 Nicotine dependence, cigarettes, uncomplicated: Secondary | ICD-10-CM

## 2016-11-21 ENCOUNTER — Encounter: Payer: Self-pay | Admitting: Internal Medicine

## 2016-11-21 ENCOUNTER — Ambulatory Visit (INDEPENDENT_AMBULATORY_CARE_PROVIDER_SITE_OTHER): Payer: PPO | Admitting: Internal Medicine

## 2016-11-21 ENCOUNTER — Telehealth: Payer: Self-pay

## 2016-11-21 ENCOUNTER — Other Ambulatory Visit (INDEPENDENT_AMBULATORY_CARE_PROVIDER_SITE_OTHER): Payer: PPO

## 2016-11-21 VITALS — BP 124/74 | HR 72 | Ht 71.0 in | Wt 188.5 lb

## 2016-11-21 DIAGNOSIS — R634 Abnormal weight loss: Secondary | ICD-10-CM | POA: Diagnosis not present

## 2016-11-21 DIAGNOSIS — K648 Other hemorrhoids: Secondary | ICD-10-CM

## 2016-11-21 DIAGNOSIS — R1013 Epigastric pain: Secondary | ICD-10-CM

## 2016-11-21 DIAGNOSIS — K219 Gastro-esophageal reflux disease without esophagitis: Secondary | ICD-10-CM

## 2016-11-21 LAB — BASIC METABOLIC PANEL
BUN: 13 mg/dL (ref 6–23)
CHLORIDE: 105 meq/L (ref 96–112)
CO2: 29 mEq/L (ref 19–32)
Calcium: 9.3 mg/dL (ref 8.4–10.5)
Creatinine, Ser: 1.25 mg/dL (ref 0.40–1.50)
GFR: 60.67 mL/min (ref >60.00)
Glucose, Bld: 100 mg/dL — ABNORMAL HIGH (ref 70–99)
POTASSIUM: 4.2 meq/L (ref 3.5–5.1)
Sodium: 140 mEq/L (ref 135–145)

## 2016-11-21 MED ORDER — HYDROCORTISONE ACETATE 25 MG RE SUPP: 25.0000 mg | Freq: Every day | RECTAL | 2 refills | Status: DC

## 2016-11-21 NOTE — Telephone Encounter (Signed)
Pt scheduled for hem banding with Dr. Hilarie Fredrickson for 01/20/17@3 :30pm. Appt letter mailed to pt.

## 2016-11-21 NOTE — Telephone Encounter (Signed)
-----   Message from Irene Shipper, MD sent at 11/21/2016 12:19 PM EDT ----- Regarding: Appointment with Dr. Dahlia Client, I saw this patient in the office today. He needs an appointment with Dr. Hilarie Fredrickson in about 4-6 weeks for in office hemorrhoid banding procedure. Dr. Hilarie Fredrickson is aware. Please contact the patient with his appointment date and time. Thank you jp

## 2016-11-21 NOTE — Progress Notes (Signed)
HISTORY OF PRESENT ILLNESS:  Andrew Bates is a 71 y.o. male with past history as listed below. He presents today with chief complaints of abdominal discomfort, unexplained weight loss, change in bowel habits, and symptomatic hemorrhoids. He is accompanied by his wife. First, patient reports vague epigastric discomfort which is noticeable but not severe. He has had decreased appetite. 10-20 pound weight loss over the past 6 months. No nausea or vomiting. No dysphagia. Reflux symptoms are the most part controlled with omeprazole 20 mg daily. Takes baby aspirin but no NSAIDs. Next, he reports a change in bowel habits over the past 3-6 weeks. Previously 1 bowel movement per day. Now 3 bowel movements per day which she describes as "sticky". The past several days he has noticed that his stools been darker. He brings me a sample. He denies hematochezia. Only, he describes what sounds like prolapsing hemorrhoids which he reduce his manually. He has been using Preparation H. These have been symptomatic over the past 3 weeks. He can go periods of time without issues. GI review of systems is otherwise negative. He does mention vague right side (not abdomen) discomfort which has occurred recently and is affected by movement. He tells me that there is a significant family history of cancer. His PCP is having him get some sort of chest radiograph evaluation to screen for lung cancer. Patient has undergone prior colonoscopy in 2010 and 2015. Examinations were unremarkable without polyps. He did undergo a CT scan in November 2015 to evaluate left lower quadrant and low back pain. This was unremarkable  REVIEW OF SYSTEMS:  All non-GI ROS negative except for anxiety, depression, hearing problems, muscle cramps, sleeping problems  Past Medical History:  Diagnosis Date  . Adenomatous colon polyp   . Anxiety   . Arthritis   . Asthma   . DVT (deep venous thrombosis) ([place]) 2008  . Essential hypertension   . GERD  (gastroesophageal reflux disease)   . History of kidney stones   . Hyperlipidemia   . Internal hemorrhoids   . Peripheral neuropathy   . Post traumatic stress disorder (PTSD)   . Sleep apnea   . Vertigo     Past Surgical History:  Procedure Laterality Date  . ANTERIOR CERVICAL DECOMP/DISCECTOMY FUSION N/A 07/09/2015   Procedure: ANTERIOR CERVICAL DECOMPRESSION/DISCECTOMY FUSION CERVICAL FIVE-SIX,CERVICAL SIX-SEVEN;  Surgeon: Newman Pies, MD;  Location: [facility];  Service: Neurosurgery;  Laterality: N/A;  . CARPAL TUNNEL RELEASE Bilateral 2014  . COLONOSCOPY    . CYSTOSCOPY/RETROGRADE/URETEROSCOPY/STONE EXTRACTION WITH BASKET Left 02/14/2014   Procedure: CYSTOSCOPY AND LEFT DOUBLE J STENT PLACEMENT;  ATTEMPTED STONE  EXTRACTION ;  Surgeon: Jorja Loa, MD;  Location: [facility];  Service: Urology;  Laterality: Left;  . ESOPHAGOGASTRODUODENOSCOPY ENDOSCOPY    . KNEE SURGERY Right 2006   Arthroscopy - MCl tear    Social History LYDIA MENG  reports that he has quit smoking. His smoking use included Cigars. He has never used smokeless tobacco. He reports that he does not drink alcohol or use drugs.  family history includes Bladder Cancer in his brother; Colon cancer (age of onset: 75) in his brother; Hyperlipidemia in his father; Hypertension in his father; Lung cancer in his father; Thyroid cancer in his mother.  Allergies  Allergen Reactions  . Other Nausea And Vomiting and Other (See Comments)    "STRONG" PAIN MEDICATIONS IN GENERAL: HEADACHE  . Tamsulosin Anaphylaxis  . Codeine Nausea And Vomiting    REACTION: Headache  .  Morphine And Related Nausea And Vomiting  . Pentazocine Nausea And Vomiting and Other (See Comments)    Headaches  . Sertraline Swelling    Swelling in knee 100 mg  . Topiramate Other (See Comments)    Headache       PHYSICAL EXAMINATION: Vital signs: BP 124/74 (BP Location: Left Arm, Patient Position: Sitting, Cuff Size: Normal)   Pulse  72   Ht 5\' 11"  (1.803 m) Comment: height measured without shoes  Wt 188 lb 8 oz (85.5 kg)   BMI 26.29 kg/m   Constitutional: generally well-appearing, no acute distress Psychiatric: alert and oriented x3, cooperative. Slightly anxious Eyes: extraocular movements intact, anicteric, conjunctiva pink Mouth: oral pharynx moist, no lesions Neck: supple without thyromegaly Lymph: no supraclavicular lymphadenopathy Cardiovascular: heart regular rate and rhythm, no murmur Lungs: clear to auscultation bilaterally Abdomen: soft, mild epigastric fullness with tenderness, nondistended, no obvious ascites, no peritoneal signs, normal bowel sounds, no organomegaly Rectal: Prolapsed inflamed hemorrhoids. Dark brown stool (not melena) Extremities: no clubbing cyanosis or lower extremity edema bilaterally Skin: no lesions on visible extremities Neuro: No focal deficits. Cranial nerves intact. No asterixis.  ASSESSMENT:  #1. Epigastric discomfort uncertain cause. #2. Weight loss. In part due to decreased appetite. Rule out organic process, particular given epigastric discomfort #3. GERD. Symptoms controlled with PPI #4. Symptomatic grade 3 internal hemorrhoids #5. Family history of colon cancer. Negative colonoscopy 2010 and 2015 #6. Change in bowel habits. Nonspecific #7. Musculoskeletal right-sided pain  PLAN:  #1. Metamucil 2 tablespoons daily #2. Sitz baths #3. Anusol HCC suppositories at night #4. Dr. Hilarie Fredrickson was kind enough to examine the patient's hemorrhoids as well as he does feel this is amenable to banding. We will set up an appointment in about 4-6 weeks #5. Continue PPI #6. Contrast-enhanced CT scan of the abdomen and pelvis to evaluate the epigastric discomfort and weight loss. Could consider EGD if negative and problem persists #7. Repeat screening colonoscopy 2020 #8. Continue with ongoing care with PCP  40 minutes spent face-to-face with the patient. Greater than 50% a time use  for counseling regarding his multiple GI issues as listed above, my impressions, and workup plan. Multiple questions from patient and wife answered to their satisfaction

## 2016-11-21 NOTE — Patient Instructions (Signed)
We have sent the following medications to your pharmacy for you to pick up at your convenience:  Anusol HC Suppositories  Take 2 tablespoons of Metamucil daily.  Use sitz baths, soak for 20 minutes.  You have been scheduled for a CT scan of the abdomen and pelvis at [facility] ([address]---this is in the same building as [facility]).   You are scheduled on 12/01/2016 at 11:00am. You should arrive 15 minutes prior to your appointment time for registration. Please follow the written instructions below on the day of your exam:  WARNING: IF YOU ARE ALLERGIC TO IODINE/X-RAY DYE, PLEASE NOTIFY RADIOLOGY IMMEDIATELY AT [phone]! YOU WILL BE GIVEN A 13 HOUR PREMEDICATION PREP.  1) Do not eat anything after 7:00am (4 hours prior to your test) 2) You have been given 2 bottles of oral contrast to drink. The solution may taste  better if refrigerated, but do NOT add ice or any other liquid to this solution. Shake well before drinking.    Drink 1 bottle of contrast @ 9:00am (2 hours prior to your exam)  Drink 1 bottle of contrast @ 10:00am (1 hour prior to your exam)  You may take any medications as prescribed with a small amount of water except for the following: Metformin, Glucophage, Glucovance, Avandamet, Riomet, Fortamet, Actoplus Met, Janumet, Glumetza or Metaglip. The above medications must be held the day of the exam AND 48 hours after the exam.  The purpose of you drinking the oral contrast is to aid in the visualization of your intestinal tract. The contrast solution may cause some diarrhea. Before your exam is started, you will be given a small amount of fluid to drink. Depending on your individual set of symptoms, you may also receive an intravenous injection of x-ray contrast/dye. Plan on being at [facility] for 30 minutes or long, depending on the type of exam you are having performed.  If you have any questions regarding your exam or if you need to  reschedule, you may call the CT department at [phone] between the hours of 8:00 am and 5:00 pm, Monday-Friday.  ________________________________________________________________________

## 2016-11-25 ENCOUNTER — Telehealth: Payer: Self-pay | Admitting: Internal Medicine

## 2016-11-25 NOTE — Telephone Encounter (Signed)
Lm on vm for patient's wife that they could ask the pharmacist to help them pick out OTC hydrocortisone suppositories and try those instead.

## 2016-12-01 ENCOUNTER — Ambulatory Visit (INDEPENDENT_AMBULATORY_CARE_PROVIDER_SITE_OTHER)
Admission: RE | Admit: 2016-12-01 | Discharge: 2016-12-01 | Disposition: A | Discharge status: Home / Self Care | Payer: PPO | Source: Ambulatory Visit | Attending: Internal Medicine | Admitting: Internal Medicine

## 2016-12-01 ENCOUNTER — Ambulatory Visit (HOSPITAL_COMMUNITY)
Admission: RE | Admit: 2016-12-01 | Discharge: 2016-12-01 | Disposition: A | Discharge status: Home / Self Care | Payer: PPO | Source: Ambulatory Visit | Attending: Family Medicine | Admitting: Family Medicine

## 2016-12-01 DIAGNOSIS — I2510 Atherosclerotic heart disease of native coronary artery without angina pectoris: Secondary | ICD-10-CM | POA: Insufficient documentation

## 2016-12-01 DIAGNOSIS — R634 Abnormal weight loss: Secondary | ICD-10-CM

## 2016-12-01 DIAGNOSIS — R109 Unspecified abdominal pain: Secondary | ICD-10-CM | POA: Diagnosis not present

## 2016-12-01 DIAGNOSIS — I700 Atherosclerosis of aorta: Secondary | ICD-10-CM | POA: Diagnosis not present

## 2016-12-01 DIAGNOSIS — R1013 Epigastric pain: Secondary | ICD-10-CM | POA: Diagnosis not present

## 2016-12-01 DIAGNOSIS — Z87891 Personal history of nicotine dependence: Secondary | ICD-10-CM | POA: Diagnosis not present

## 2016-12-01 DIAGNOSIS — F17210 Nicotine dependence, cigarettes, uncomplicated: Secondary | ICD-10-CM

## 2016-12-01 DIAGNOSIS — K648 Other hemorrhoids: Secondary | ICD-10-CM

## 2016-12-01 MED ORDER — IOPAMIDOL (ISOVUE-300) INJECTION 61%: 100.0000 mL | Freq: Once | INTRAVENOUS | Status: DC | PRN

## 2017-01-20 ENCOUNTER — Encounter: Payer: Self-pay | Admitting: Internal Medicine

## 2017-01-20 ENCOUNTER — Ambulatory Visit (INDEPENDENT_AMBULATORY_CARE_PROVIDER_SITE_OTHER): Payer: PPO | Admitting: Internal Medicine

## 2017-01-20 VITALS — BP 124/72 | HR 68 | Ht 73.0 in | Wt 193.0 lb

## 2017-01-20 DIAGNOSIS — K648 Other hemorrhoids: Secondary | ICD-10-CM

## 2017-01-20 NOTE — Progress Notes (Signed)
Andrew Bates is a 71 yo male patient of Dr. Perry's who presents for hemorrhoidal banding for symptomatic internal hemorrhoids I met the patient recently when he was seen by Dr. Perry in the office Symptoms are that of grade 3 prolapsing internal hemorrhoids. He does not have rectal bleeding, denies itching, burning, and fecal smearing   PROCEDURE NOTE:  The patient presents with symptomatic grade 3 internal hemorrhoids, requesting rubber band ligation of his hemorrhoidal disease.  All risks, benefits and alternative forms of therapy were described and informed consent was obtained.   The anorectum was pre-medicated with 0.125% nitroglycerin ointment The decision was made to band the right anterior internal hemorrhoid, and the [facility] System was used to perform band ligation without complication.   Digital anorectal examination was then performed to assure proper positioning of the band, and to adjust the banded tissue as required.  The patient was discharged home without pain or other issues.  Dietary and behavioral recommendations were given and along with follow-up instructions.      The patient will return as scheduled for  follow-up and possible additional banding as required. No complications were encountered and the patient tolerated the procedure well.   

## 2017-01-20 NOTE — Patient Instructions (Addendum)
Your 2nd hemorrhoidal banding is scheduled for 03/23/17 @ 3:45 pm.  HEMORRHOID BANDING PROCEDURE    FOLLOW-UP CARE   1. The procedure you have had should have been relatively painless since the banding of the area involved does not have nerve endings and there is no pain sensation.  The rubber band cuts off the blood supply to the hemorrhoid and the band may fall off as soon as 48 hours after the banding (the band may occasionally be seen in the toilet bowl following a bowel movement). You may notice a temporary feeling of fullness in the rectum which should respond adequately to plain Tylenol or Motrin.  2. Following the banding, avoid strenuous exercise that evening and resume full activity the next day.  A sitz bath (soaking in a warm tub) or bidet is soothing, and can be useful for cleansing the area after bowel movements.     3. To avoid constipation, take two tablespoons of natural wheat bran, natural oat bran, flax, Benefiber or any over the counter fiber supplement and increase your water intake to 7-8 glasses daily.    4. Unless you have been prescribed anorectal medication, do not put anything inside your rectum for two weeks: No suppositories, enemas, fingers, etc.  5. Occasionally, you may have more bleeding than usual after the banding procedure.  This is often from the untreated hemorrhoids rather than the treated one.  Don't be concerned if there is a tablespoon or so of blood.  If there is more blood than this, lie flat with your bottom higher than your head and apply an ice pack to the area. If the bleeding does not stop within a half an hour or if you feel faint, call our office at [phone] or go to the emergency room.  6. Problems are not common; however, if there is a substantial amount of bleeding, severe pain, chills, fever or difficulty passing urine (very rare) or other problems, you should call us at [phone] or report to the nearest emergency room.  7. Do  not stay seated continuously for more than 2-3 hours for a day or two after the procedure.  Tighten your buttock muscles 10-15 times every two hours and take 10-15 deep breaths every 1-2 hours.  Do not spend more than a few minutes on the toilet if you cannot empty your bowel; instead re-visit the toilet at a later time.

## 2017-02-24 DIAGNOSIS — I10 Essential (primary) hypertension: Secondary | ICD-10-CM | POA: Diagnosis not present

## 2017-02-24 DIAGNOSIS — Z6827 Body mass index (BMI) 27.0-27.9, adult: Secondary | ICD-10-CM | POA: Diagnosis not present

## 2017-02-24 DIAGNOSIS — Z03818 Encounter for observation for suspected exposure to other biological agents ruled out: Secondary | ICD-10-CM | POA: Diagnosis not present

## 2017-02-24 DIAGNOSIS — E663 Overweight: Secondary | ICD-10-CM | POA: Diagnosis not present

## 2017-03-05 ENCOUNTER — Ambulatory Visit (INDEPENDENT_AMBULATORY_CARE_PROVIDER_SITE_OTHER): Payer: PPO | Admitting: Otolaryngology

## 2017-03-05 DIAGNOSIS — H6122 Impacted cerumen, left ear: Secondary | ICD-10-CM

## 2017-03-23 ENCOUNTER — Ambulatory Visit (INDEPENDENT_AMBULATORY_CARE_PROVIDER_SITE_OTHER): Payer: PPO | Admitting: Internal Medicine

## 2017-03-23 ENCOUNTER — Encounter: Payer: Self-pay | Admitting: Internal Medicine

## 2017-03-23 VITALS — BP 140/80 | HR 60 | Ht 71.0 in | Wt 201.0 lb

## 2017-03-23 DIAGNOSIS — K648 Other hemorrhoids: Secondary | ICD-10-CM

## 2017-03-23 NOTE — Progress Notes (Signed)
Andrew Bates is a 71 year old male patient of Dr. Blanch Media who presents for repeat hemorrhoidal banding Hemorrhoidal banding performed to the right anterior internal hemorrhoid on 01/20/2017 Symptomatic hemorrhoids, grade 3 prolapsing internal hemorrhoids. No rectal bleeding, itching burning or smearing Doesn't feel much improvement after initial hemorrhoidal banding. Tolerated it well. Wishes to proceed with banding protocol   PROCEDURE NOTE:  The patient presents with symptomatic grade 3 internal hemorrhoids, requesting rubber band ligation of his hemorrhoidal disease.  All risks, benefits and alternative forms of therapy were described and informed consent was obtained.   The anorectum was pre-medicated with 0.125% nitroglycerin ointment The decision was made to band the LL (RA on 01/20/17) internal hemorrhoid, and the [place] was used to perform band ligation without complication.   Digital anorectal examination was then performed to assure proper positioning of the band, and to adjust the banded tissue as required.  The patient was discharged home without pain or other issues.  Dietary and behavioral recommendations were given and along with follow-up instructions.     The following adjunctive treatments were recommended: Continue the Benefiber  The patient will return as scheduled for  follow-up and possible additional banding as required. No complications were encountered and the patient tolerated the procedure well.

## 2017-03-23 NOTE — Patient Instructions (Signed)

## 2017-04-01 ENCOUNTER — Encounter: Payer: PPO | Admitting: Internal Medicine

## 2017-04-13 ENCOUNTER — Encounter: Payer: Self-pay | Admitting: Internal Medicine

## 2017-04-13 ENCOUNTER — Ambulatory Visit (INDEPENDENT_AMBULATORY_CARE_PROVIDER_SITE_OTHER): Payer: PPO | Admitting: Internal Medicine

## 2017-04-13 VITALS — BP 136/82 | HR 74 | Ht 72.0 in | Wt 196.5 lb

## 2017-04-13 DIAGNOSIS — K642 Third degree hemorrhoids: Secondary | ICD-10-CM | POA: Diagnosis not present

## 2017-04-13 NOTE — Progress Notes (Signed)
Andrew Bates is a 71 year old male patient of Dr. Henrene Pastor who presents for repeat hemorrhoidal banding Hemorrhoidal banding performed on 01/20/2017 and 03/23/2017. Symptomatic grade 3 prolapsing internal hemorrhoids prior to banding. No rectal bleeding, itching or fecal smearing Did not have much improvement after initial hemorrhoidal banding but has had dramatic improvement with second band placement Bowel movements have been easier and less prolapse He continues benefiber and increased from 1-2 teaspoons to 1-2 tablespoons after our last visit which he has found even more beneficial   PROCEDURE NOTE:  The patient presents with symptomatic grade 3 internal hemorrhoids, requesting rubber band ligation of his hemorrhoidal disease.  All risks, benefits and alternative forms of therapy were described and informed consent was obtained.   The anorectum was pre-medicated with 0.125% nitroglycerin ointment The decision was made to band the RP (RA and LL banded previously) internal hemorrhoid, and the [place] was used to perform band ligation without complication.   Digital anorectal examination was then performed to assure proper positioning of the band, and to adjust the banded tissue as required.  The patient was discharged home without pain or other issues.  Dietary and behavioral recommendations were given and along with follow-up instructions.     The following adjunctive treatments were recommended: Continue Benefiber  The patient will return as needed with Dr. Henrene Pastor  No complications were encountered and the patient tolerated the procedure well.

## 2017-04-13 NOTE — Patient Instructions (Signed)
If you are age 69 or older, your body mass index should be between 23-30. Your Body mass index is 26.65 kg/m. If this is out of the aforementioned range listed, please consider follow up with your Primary Care Provider.  If you are age 7 or younger, your body mass index should be between 19-25. Your Body mass index is 26.65 kg/m. If this is out of the aformentioned range listed, please consider follow up with your Primary Care Provider.   Please continue Benefiber.  Follow up as needed with Dr. Hilarie Fredrickson.  HEMORRHOID BANDING PROCEDURE    FOLLOW-UP CARE   1. The procedure you have had should have been relatively painless since the banding of the area involved does not have nerve endings and there is no pain sensation.  The rubber band cuts off the blood supply to the hemorrhoid and the band may fall off as soon as 48 hours after the banding (the band may occasionally be seen in the toilet bowl following a bowel movement). You may notice a temporary feeling of fullness in the rectum which should respond adequately to plain Tylenol or Motrin.  2. Following the banding, avoid strenuous exercise that evening and resume full activity the next day.  A sitz bath (soaking in a warm tub) or bidet is soothing, and can be useful for cleansing the area after bowel movements.     3. To avoid constipation, take two tablespoons of natural wheat bran, natural oat bran, flax, Benefiber or any over the counter fiber supplement and increase your water intake to 7-8 glasses daily.    4. Unless you have been prescribed anorectal medication, do not put anything inside your rectum for two weeks: No suppositories, enemas, fingers, etc.  5. Occasionally, you may have more bleeding than usual after the banding procedure.  This is often from the untreated hemorrhoids rather than the treated one.  Don't be concerned if there is a tablespoon or so of blood.  If there is more blood than this, lie flat with your bottom  higher than your head and apply an ice pack to the area. If the bleeding does not stop within a half an hour or if you feel faint, call our office at [phone] or go to the emergency room.  6. Problems are not common; however, if there is a substantial amount of bleeding, severe pain, chills, fever or difficulty passing urine (very rare) or other problems, you should call us at [phone] or report to the nearest emergency room.  7. Do not stay seated continuously for more than 2-3 hours for a day or two after the procedure.  Tighten your buttock muscles 10-15 times every two hours and take 10-15 deep breaths every 1-2 hours.  Do not spend more than a few minutes on the toilet if you cannot empty your bowel; instead re-visit the toilet at a later time.    Thank you.

## 2017-04-21 ENCOUNTER — Ambulatory Visit (INDEPENDENT_AMBULATORY_CARE_PROVIDER_SITE_OTHER): Payer: PPO | Admitting: Urology

## 2017-04-21 DIAGNOSIS — N5319 Other ejaculatory dysfunction: Secondary | ICD-10-CM

## 2017-04-21 DIAGNOSIS — N5201 Erectile dysfunction due to arterial insufficiency: Secondary | ICD-10-CM

## 2017-04-21 DIAGNOSIS — N200 Calculus of kidney: Secondary | ICD-10-CM | POA: Diagnosis not present

## 2017-08-11 ENCOUNTER — Ambulatory Visit: Payer: PPO | Admitting: Urology

## 2017-08-19 DIAGNOSIS — H5203 Hypermetropia, bilateral: Secondary | ICD-10-CM | POA: Diagnosis not present

## 2017-08-19 DIAGNOSIS — H524 Presbyopia: Secondary | ICD-10-CM | POA: Diagnosis not present

## 2017-08-19 DIAGNOSIS — H52221 Regular astigmatism, right eye: Secondary | ICD-10-CM | POA: Diagnosis not present

## 2017-08-19 DIAGNOSIS — H18413 Arcus senilis, bilateral: Secondary | ICD-10-CM | POA: Diagnosis not present

## 2017-11-10 DIAGNOSIS — F419 Anxiety disorder, unspecified: Secondary | ICD-10-CM | POA: Diagnosis not present

## 2017-11-10 DIAGNOSIS — Z0001 Encounter for general adult medical examination with abnormal findings: Secondary | ICD-10-CM | POA: Diagnosis not present

## 2017-11-10 DIAGNOSIS — G64 Other disorders of peripheral nervous system: Secondary | ICD-10-CM | POA: Diagnosis not present

## 2017-11-10 DIAGNOSIS — E785 Hyperlipidemia, unspecified: Secondary | ICD-10-CM | POA: Diagnosis not present

## 2017-11-10 DIAGNOSIS — I10 Essential (primary) hypertension: Secondary | ICD-10-CM | POA: Diagnosis not present

## 2017-11-10 DIAGNOSIS — E663 Overweight: Secondary | ICD-10-CM | POA: Diagnosis not present

## 2017-11-10 DIAGNOSIS — Z6826 Body mass index (BMI) 26.0-26.9, adult: Secondary | ICD-10-CM | POA: Diagnosis not present

## 2017-11-10 DIAGNOSIS — Z1389 Encounter for screening for other disorder: Secondary | ICD-10-CM | POA: Diagnosis not present

## 2017-11-10 DIAGNOSIS — R7309 Other abnormal glucose: Secondary | ICD-10-CM | POA: Diagnosis not present

## 2017-12-02 DIAGNOSIS — N289 Disorder of kidney and ureter, unspecified: Secondary | ICD-10-CM | POA: Diagnosis not present

## 2017-12-02 DIAGNOSIS — E782 Mixed hyperlipidemia: Secondary | ICD-10-CM | POA: Diagnosis not present

## 2017-12-02 DIAGNOSIS — E663 Overweight: Secondary | ICD-10-CM | POA: Diagnosis not present

## 2017-12-02 DIAGNOSIS — Z6826 Body mass index (BMI) 26.0-26.9, adult: Secondary | ICD-10-CM | POA: Diagnosis not present

## 2018-02-18 DIAGNOSIS — H18413 Arcus senilis, bilateral: Secondary | ICD-10-CM | POA: Diagnosis not present

## 2018-02-18 DIAGNOSIS — H04123 Dry eye syndrome of bilateral lacrimal glands: Secondary | ICD-10-CM | POA: Diagnosis not present

## 2018-03-05 DIAGNOSIS — H16223 Keratoconjunctivitis sicca, not specified as Sjogren's, bilateral: Secondary | ICD-10-CM | POA: Diagnosis not present

## 2018-03-05 DIAGNOSIS — H04123 Dry eye syndrome of bilateral lacrimal glands: Secondary | ICD-10-CM | POA: Diagnosis not present

## 2018-04-13 ENCOUNTER — Ambulatory Visit: Payer: PPO | Admitting: Urology

## 2018-04-13 DIAGNOSIS — N200 Calculus of kidney: Secondary | ICD-10-CM | POA: Diagnosis not present

## 2018-04-13 DIAGNOSIS — R300 Dysuria: Secondary | ICD-10-CM

## 2018-04-13 DIAGNOSIS — Z23 Encounter for immunization: Secondary | ICD-10-CM | POA: Diagnosis not present

## 2018-04-13 DIAGNOSIS — N5201 Erectile dysfunction due to arterial insufficiency: Secondary | ICD-10-CM

## 2018-07-21 DIAGNOSIS — G43909 Migraine, unspecified, not intractable, without status migrainosus: Secondary | ICD-10-CM | POA: Diagnosis not present

## 2018-07-21 DIAGNOSIS — R7309 Other abnormal glucose: Secondary | ICD-10-CM | POA: Diagnosis not present

## 2018-07-21 DIAGNOSIS — Z0001 Encounter for general adult medical examination with abnormal findings: Secondary | ICD-10-CM | POA: Diagnosis not present

## 2018-07-21 DIAGNOSIS — E663 Overweight: Secondary | ICD-10-CM | POA: Diagnosis not present

## 2018-07-21 DIAGNOSIS — Z1389 Encounter for screening for other disorder: Secondary | ICD-10-CM | POA: Diagnosis not present

## 2018-07-21 DIAGNOSIS — I7300 Raynaud's syndrome without gangrene: Secondary | ICD-10-CM | POA: Diagnosis not present

## 2018-07-21 DIAGNOSIS — Z6825 Body mass index (BMI) 25.0-25.9, adult: Secondary | ICD-10-CM | POA: Diagnosis not present

## 2018-07-21 DIAGNOSIS — I10 Essential (primary) hypertension: Secondary | ICD-10-CM | POA: Diagnosis not present

## 2018-07-21 DIAGNOSIS — E7849 Other hyperlipidemia: Secondary | ICD-10-CM | POA: Diagnosis not present

## 2018-07-31 ENCOUNTER — Other Ambulatory Visit: Payer: Self-pay

## 2018-07-31 ENCOUNTER — Emergency Department (HOSPITAL_COMMUNITY)
Admission: EM | Admit: 2018-07-31 | Discharge: 2018-07-31 | Disposition: A | Discharge status: Home / Self Care | Payer: No Typology Code available for payment source | Attending: Emergency Medicine | Admitting: Emergency Medicine

## 2018-07-31 ENCOUNTER — Encounter (HOSPITAL_COMMUNITY): Payer: Self-pay

## 2018-07-31 DIAGNOSIS — Z79899 Other long term (current) drug therapy: Secondary | ICD-10-CM | POA: Insufficient documentation

## 2018-07-31 DIAGNOSIS — J45909 Unspecified asthma, uncomplicated: Secondary | ICD-10-CM | POA: Insufficient documentation

## 2018-07-31 DIAGNOSIS — R42 Dizziness and giddiness: Secondary | ICD-10-CM | POA: Insufficient documentation

## 2018-07-31 DIAGNOSIS — I10 Essential (primary) hypertension: Secondary | ICD-10-CM | POA: Insufficient documentation

## 2018-07-31 DIAGNOSIS — F419 Anxiety disorder, unspecified: Secondary | ICD-10-CM | POA: Insufficient documentation

## 2018-07-31 DIAGNOSIS — Z87891 Personal history of nicotine dependence: Secondary | ICD-10-CM | POA: Insufficient documentation

## 2018-07-31 LAB — CBC WITH DIFFERENTIAL/PLATELET
ABS IMMATURE GRANULOCYTES: 0.02 10*3/uL (ref 0.00–0.07)
BASOS ABS: 0.1 10*3/uL (ref 0.0–0.1)
BASOS PCT: 1 %
EOS ABS: 0.6 10*3/uL — AB (ref 0.0–0.5)
Eosinophils Relative: 6 %
HEMATOCRIT: 49.6 % (ref 39.0–52.0)
Hemoglobin: 16.3 g/dL (ref 13.0–17.0)
IMMATURE GRANULOCYTES: 0 %
LYMPHS ABS: 2 10*3/uL (ref 0.7–4.0)
Lymphocytes Relative: 21 %
MCH: 32.7 pg (ref 26.0–34.0)
MCHC: 32.9 g/dL (ref 30.0–36.0)
MCV: 99.4 fL (ref 80.0–100.0)
MONOS PCT: 8 %
Monocytes Absolute: 0.7 10*3/uL (ref 0.1–1.0)
NEUTROS ABS: 6 10*3/uL (ref 1.7–7.7)
NEUTROS PCT: 64 %
NRBC: 0 % (ref 0.0–0.2)
PLATELETS: 269 10*3/uL (ref 150–400)
RBC: 4.99 MIL/uL (ref 4.22–5.81)
RDW: 12.4 % (ref 11.5–15.5)
WBC: 9.4 10*3/uL (ref 4.0–10.5)

## 2018-07-31 LAB — COMPREHENSIVE METABOLIC PANEL
ALBUMIN: 4.7 g/dL (ref 3.5–5.0)
ALT: 19 U/L (ref 0–44)
AST: 16 U/L (ref 15–41)
Alkaline Phosphatase: 80 U/L (ref 38–126)
Anion gap: 9 (ref 5–15)
BUN: 18 mg/dL (ref 8–23)
CHLORIDE: 105 mmol/L (ref 98–111)
CO2: 26 mmol/L (ref 22–32)
CREATININE: 1.32 mg/dL — AB (ref 0.61–1.24)
Calcium: 9.3 mg/dL (ref 8.9–10.3)
GFR calc Af Amer: >60 mL/min (ref >60)
GFR calc non Af Amer: 54 mL/min — ABNORMAL LOW (ref >60)
GLUCOSE: 91 mg/dL (ref 70–99)
POTASSIUM: 4.2 mmol/L (ref 3.5–5.1)
Sodium: 140 mmol/L (ref 135–145)
Total Bilirubin: 0.6 mg/dL (ref 0.3–1.2)
Total Protein: 7.4 g/dL (ref 6.5–8.1)

## 2018-07-31 MED ORDER — METHYLPREDNISOLONE SODIUM SUCC 125 MG IJ SOLR
125.0000 mg | Freq: Once | INTRAMUSCULAR | Status: AC
  Administered 2018-07-31: 125 mg via INTRAVENOUS
  Filled 2018-07-31: qty 2

## 2018-07-31 MED ORDER — DIAZEPAM 5 MG PO TABS: ORAL_TABLET | ORAL | 0 refills | Status: DC

## 2018-07-31 MED ORDER — LORAZEPAM 2 MG/ML IJ SOLN
0.5000 mg | Freq: Once | INTRAMUSCULAR | Status: AC
  Administered 2018-07-31: 0.5 mg via INTRAVENOUS
  Filled 2018-07-31: qty 1

## 2018-07-31 MED ORDER — ONDANSETRON HCL 4 MG/2ML IJ SOLN
4.0000 mg | Freq: Once | INTRAMUSCULAR | Status: AC
  Administered 2018-07-31: 4 mg via INTRAVENOUS
  Filled 2018-07-31: qty 2

## 2018-07-31 MED ORDER — ONDANSETRON 4 MG PO TBDP: ORAL_TABLET | ORAL | 0 refills | Status: DC

## 2018-07-31 MED ORDER — SODIUM CHLORIDE 0.9 % IV BOLUS
1000.0000 mL | Freq: Once | INTRAVENOUS | Status: AC
  Administered 2018-07-31: 1000 mL via INTRAVENOUS

## 2018-07-31 NOTE — ED Provider Notes (Signed)
[facility] EMERGENCY DEPARTMENT Provider Note   CSN: [id number] Arrival date & time: 07/31/18  1209     History   Chief Complaint Chief Complaint  Patient presents with  . Dizziness    HPI CHON BUHL is a 73 y.o. male.  Patient states he has a history of vertigo.  He started with similar symptoms today.  The history is provided by the patient. No language interpreter was used.  Dizziness  Quality:  Lightheadedness Severity:  Mild Onset quality:  Sudden Duration:  2 hours Timing:  Constant Progression:  Worsening Chronicity:  Recurrent Context: not when bending over   Relieved by:  Nothing Worsened by:  Nothing Ineffective treatments:  None tried Associated symptoms: no blood in stool, no chest pain, no diarrhea and no headaches   Risk factors: no anemia     Past Medical History:  Diagnosis Date  . Adenomatous colon polyp   . Anxiety   . Arthritis   . Asthma   . DVT (deep venous thrombosis) ([place]) 2008  . Essential hypertension   . GERD (gastroesophageal reflux disease)   . History of kidney stones   . Hyperlipidemia   . Internal hemorrhoids   . Peripheral neuropathy   . Post traumatic stress disorder (PTSD)   . Sleep apnea   . Vertigo     Patient Active Problem List   Diagnosis Date Noted  . Cervical spondylosis with radiculopathy 07/09/2015  . Urinary frequency 02/25/2015  . Dehydration 02/25/2015  . Severe vertigo 02/25/2015  . Hydronephrosis 02/15/2014  . Acute renal failure ([place]) 02/15/2014  . Calculus of ureter 02/14/2014  . Urinary hesitancy 11/17/2013  . Overweight(278.02) 11/17/2013  . Acute pyelonephritis 11/16/2013  . Kidney stones 11/16/2013  . Acute onset of severe vertigo 11/16/2013  . Sepsis secondary to UTI ([place]) 11/16/2013  . Hypertension 11/16/2013  . Hyperlipemia 11/16/2013  . GERD (gastroesophageal reflux disease) 11/16/2013    Past Surgical History:  Procedure Laterality Date  . ANTERIOR CERVICAL DECOMP/DISCECTOMY  FUSION N/A 07/09/2015   Procedure: ANTERIOR CERVICAL DECOMPRESSION/DISCECTOMY FUSION CERVICAL FIVE-SIX,CERVICAL SIX-SEVEN;  Surgeon: Newman Pies, MD;  Location: [place] [facility];  Service: Neurosurgery;  Laterality: N/A;  . CARPAL TUNNEL RELEASE Bilateral 2014  . COLONOSCOPY    . CYSTOSCOPY/RETROGRADE/URETEROSCOPY/STONE EXTRACTION WITH BASKET Left 02/14/2014   Procedure: CYSTOSCOPY AND LEFT DOUBLE J STENT PLACEMENT;  ATTEMPTED STONE  EXTRACTION ;  Surgeon: Jorja Loa, MD;  Location: [facility];  Service: Urology;  Laterality: Left;  . ESOPHAGOGASTRODUODENOSCOPY ENDOSCOPY    . KNEE SURGERY Right 2006   Arthroscopy - MCl tear        Home Medications    Prior to Admission medications   Medication Sig Start Date End Date Taking? Authorizing Provider  Artificial Tear Ointment (DRY EYES OP) Apply 1 application to eye 3 (three) times daily.   Yes [provider]  Biotin (BIOTIN 5000) 5 MG CAPS Take 1 capsule by mouth daily.   Yes [provider]  busPIRone (BUSPAR) 10 MG tablet Take 10 mg by mouth 2 (two) times daily.   Yes [provider]  Cholecalciferol (VITAMIN D3 PO) Take 1 tablet by mouth daily.   Yes [provider]  diltiazem (CARDIZEM CD) 180 MG 24 hr capsule Take 1 capsule by mouth at bedtime. 07/21/18  Yes [provider]  L-Arginine 500 MG CAPS Take 1 capsule by mouth daily.   Yes [provider]  omeprazole (PRILOSEC) 20 MG capsule Take 20 mg by mouth every  morning.    Yes [provider]  promethazine (PHENERGAN) 25 MG tablet Take 25 mg by mouth every 6 (six) hours as needed for nausea or vomiting.   Yes [provider]  albuterol (PROVENTIL HFA;VENTOLIN HFA) 108 (90 BASE) MCG/ACT inhaler Inhale 2 puffs into the lungs every 6 (six) hours as needed for wheezing or shortness of breath.    [provider]  diazepam (VALIUM) 5 MG tablet Take 1 every 4-6 hours as needed for dizziness 07/31/18   Milton Ferguson, MD  gabapentin (NEURONTIN) 400 MG capsule Take 800 mg by mouth 3 (three) times daily.     [provider]  hydrocortisone (ANUSOL-HC) 25 MG suppository Place 1 suppository (25 mg total) rectally at bedtime. 11/21/16   Irene Shipper, MD  LORazepam (ATIVAN) 0.5 MG tablet Take 1 tablet by mouth 3 (three) times daily as needed.  11/17/16   [provider]  ondansetron (ZOFRAN ODT) 4 MG disintegrating tablet 4mg  ODT q4 hours prn nausea/vomit 07/31/18   Milton Ferguson, MD  ondansetron (ZOFRAN) 8 MG tablet Take 1 tablet (8 mg total) by mouth every 8 (eight) hours as needed for nausea or vomiting. 05/06/16   [name], MD  psyllium (METAMUCIL) 58.6 % powder Take by mouth. 2 teaspoons daily    [provider]    Family History Family History  Problem Relation Age of Onset  . Hypertension Father   . Hyperlipidemia Father   . Lung cancer Father        from piece of abestos lodged in his lung  . Thyroid cancer Mother   . Colon cancer Brother 71  . Bladder Cancer Brother     Social History Social History   Tobacco Use  . Smoking status: Former Smoker    Types: Cigars  . Smokeless tobacco: Never Used  . Tobacco comment: quit 15 years ago (2017) never smoked alot  Substance Use Topics  . Alcohol use: No  . Drug use: No     Allergies   Other; Tamsulosin; Codeine; Morphine and related; Pentazocine; Sertraline; and Topiramate   Review of Systems Review of Systems  Constitutional: Negative for appetite change and fatigue.  HENT: Negative for congestion, ear discharge and sinus pressure.   Eyes: Negative for discharge.  Respiratory: Negative for cough.   Cardiovascular: Negative for chest pain.  Gastrointestinal: Negative for abdominal pain, blood in stool and diarrhea.  Genitourinary: Negative for frequency and hematuria.  Musculoskeletal: Negative for back pain.  Skin: Negative for rash.  Neurological: Positive for dizziness. Negative for seizures  and headaches.  Psychiatric/Behavioral: Negative for hallucinations.     Physical Exam Updated Vital Signs BP (!) 165/91   Pulse (!) 47   Temp 98.1 F (36.7 C) (Temporal)   Resp 14   Ht 6\' 1"  (1.854 m)   Wt 86.2 kg   SpO2 100%   BMI 25.07 kg/m   Physical Exam Vitals signs and nursing note reviewed.  Constitutional:      Appearance: He is well-developed.  HENT:     Head: Normocephalic.     Nose: Nose normal.  Eyes:     General: No scleral icterus.    Conjunctiva/sclera: Conjunctivae normal.     Comments: No nystagmus  Neck:     Musculoskeletal: Neck supple.     Thyroid: No thyromegaly.  Cardiovascular:     Rate and Rhythm: Normal rate and regular rhythm.     Heart sounds: No murmur. No friction rub. No gallop.  Pulmonary:     Breath sounds: No stridor. No wheezing or rales.  Chest:     Chest wall: No tenderness.  Abdominal:     General: There is no distension.     Tenderness: There is no abdominal tenderness. There is no rebound.  Musculoskeletal: Normal range of motion.  Lymphadenopathy:     Cervical: No cervical adenopathy.  Skin:    Findings: No erythema or rash.  Neurological:     Mental Status: He is alert and oriented to person, place, and time.     Motor: No abnormal muscle tone.     Coordination: Coordination normal.  Psychiatric:        Behavior: Behavior normal.      ED Treatments / Results  Labs (all labs ordered are listed, but only abnormal results are displayed) Labs Reviewed  CBC WITH DIFFERENTIAL/PLATELET - Abnormal; Notable for the following components:      Result Value   Eosinophils Absolute 0.6 (*)    All other components within normal limits  COMPREHENSIVE METABOLIC PANEL - Abnormal; Notable for the following components:   Creatinine, Ser 1.32 (*)    GFR calc non Af Amer 54 (*)    All other components within normal limits    EKG None  Radiology No results found.  Procedures Procedures (including critical care  time)  Medications Ordered in ED Medications  sodium chloride 0.9 % bolus 1,000 mL (0 mLs Intravenous Stopped 07/31/18 1909)  ondansetron (ZOFRAN) injection 4 mg (4 mg Intravenous Given 07/31/18 1800)  methylPREDNISolone sodium succinate (SOLU-MEDROL) 125 mg/2 mL injection 125 mg (125 mg Intravenous Given 07/31/18 1801)  LORazepam (ATIVAN) injection 0.5 mg (0.5 mg Intravenous Given 07/31/18 1801)     Initial Impression / Assessment and Plan / ED Course  I have reviewed the triage vital signs and the nursing notes.  Pertinent labs & imaging results that were available during my care of the patient were reviewed by me and considered in my medical decision making (see chart for details).     She improved with fluids Zofran Valium.  He will be discharged home with Zofran and Valium follow-up with his PCP or neurologist Final Clinical Impressions(s) / ED Diagnoses   Final diagnoses:  Vertigo    ED Discharge Orders         Ordered    ondansetron (ZOFRAN ODT) 4 MG disintegrating tablet     07/31/18 1917    diazepam (VALIUM) 5 MG tablet     07/31/18 1917           Milton Ferguson, MD 07/31/18 1921

## 2018-07-31 NOTE — ED Triage Notes (Addendum)
Pt reports that he feels like he is beginning to have vertigo. Pt reports feels like he may begin to feel like he may begin spinning to right and if he does he will start screaming . Pt reports he has been nausea .

## 2018-07-31 NOTE — Discharge Instructions (Addendum)
Follow-up with your family doctor or neurologist

## 2018-08-03 ENCOUNTER — Other Ambulatory Visit: Payer: Self-pay

## 2018-08-03 ENCOUNTER — Emergency Department (HOSPITAL_COMMUNITY)
Admission: EM | Admit: 2018-08-03 | Discharge: 2018-08-03 | Disposition: A | Discharge status: Home / Self Care | Payer: PPO | Attending: Emergency Medicine | Admitting: Emergency Medicine

## 2018-08-03 ENCOUNTER — Encounter (HOSPITAL_COMMUNITY): Payer: Self-pay | Admitting: Emergency Medicine

## 2018-08-03 DIAGNOSIS — I10 Essential (primary) hypertension: Secondary | ICD-10-CM | POA: Insufficient documentation

## 2018-08-03 DIAGNOSIS — F419 Anxiety disorder, unspecified: Secondary | ICD-10-CM | POA: Diagnosis not present

## 2018-08-03 DIAGNOSIS — Z87891 Personal history of nicotine dependence: Secondary | ICD-10-CM | POA: Diagnosis not present

## 2018-08-03 DIAGNOSIS — J45909 Unspecified asthma, uncomplicated: Secondary | ICD-10-CM | POA: Insufficient documentation

## 2018-08-03 DIAGNOSIS — Z79899 Other long term (current) drug therapy: Secondary | ICD-10-CM | POA: Insufficient documentation

## 2018-08-03 DIAGNOSIS — R42 Dizziness and giddiness: Secondary | ICD-10-CM | POA: Diagnosis not present

## 2018-08-03 LAB — BASIC METABOLIC PANEL
Anion gap: 8 (ref 5–15)
BUN: 18 mg/dL (ref 8–23)
CALCIUM: 8.6 mg/dL — AB (ref 8.9–10.3)
CO2: 28 mmol/L (ref 22–32)
Chloride: 102 mmol/L (ref 98–111)
Creatinine, Ser: 1.1 mg/dL (ref 0.61–1.24)
GFR calc Af Amer: >60 mL/min (ref >60)
Glucose, Bld: 97 mg/dL (ref 70–99)
Potassium: 3.6 mmol/L (ref 3.5–5.1)
Sodium: 138 mmol/L (ref 135–145)

## 2018-08-03 LAB — CBC
HCT: 46.4 % (ref 39.0–52.0)
Hemoglobin: 15.3 g/dL (ref 13.0–17.0)
MCH: 32.2 pg (ref 26.0–34.0)
MCHC: 33 g/dL (ref 30.0–36.0)
MCV: 97.7 fL (ref 80.0–100.0)
Platelets: 271 10*3/uL (ref 150–400)
RBC: 4.75 MIL/uL (ref 4.22–5.81)
RDW: 12.6 % (ref 11.5–15.5)
WBC: 8.7 10*3/uL (ref 4.0–10.5)
nRBC: 0 % (ref 0.0–0.2)

## 2018-08-03 MED ORDER — MECLIZINE HCL 12.5 MG PO TABS
50.0000 mg | ORAL_TABLET | Freq: Once | ORAL | Status: AC
  Administered 2018-08-03: 50 mg via ORAL
  Filled 2018-08-03: qty 4

## 2018-08-03 MED ORDER — MECLIZINE HCL 25 MG PO TABS: 25.0000 mg | ORAL_TABLET | Freq: Three times a day (TID) | ORAL | 1 refills | Status: DC | PRN

## 2018-08-03 MED ORDER — SODIUM CHLORIDE 0.9 % IV BOLUS
1000.0000 mL | Freq: Once | INTRAVENOUS | Status: AC
  Administered 2018-08-03: 1000 mL via INTRAVENOUS

## 2018-08-03 NOTE — Discharge Instructions (Addendum)
Follow-up with your doctor and consider seeing your neurologist or ENT doctor again to discuss possible outpatient echo therapy, you can take meclizine 25 to 50 mg from twice to 3 times daily as needed

## 2018-08-03 NOTE — ED Triage Notes (Signed)
Patient complaining of dizziness and nausea x 3 weeks. States he was treated here recently for same.

## 2018-08-03 NOTE — ED Provider Notes (Signed)
[facility] EMERGENCY DEPARTMENT Provider Note   CSN: [id number] Arrival date & time: 08/03/18  1441     History   Chief Complaint Chief Complaint  Patient presents with  . Dizziness    HPI Andrew Bates is a 73 y.o. male.  HPI Patient presents to the emergency room for evaluation of dizziness.  Patient states he is had trouble with dizziness for many years.  He has had multiple outpatient evaluations and has seen multiple specialists.  He has seen ENT specialists.  He has seen neurologist.  He has been to multiple medical facilities for specialty evaluation.  Patient states the last several weeks he has been having recurrent issues with his vertigo.  When he Gets these spells he gets very nauseated and diaphoretic.  He starts vomiting.  The episodes are severe.  Patient has felt slightly dizzy today.  Had some mild nausea.  Patient was concerned that he was going to have a severe spell today and this primarily when he came in.  He was given some meclizine out in the waiting room and it has helped somewhat.  He denies any numbness or weakness.  No chest pain or shortness of breath. Past Medical History:  Diagnosis Date  . Adenomatous colon polyp   . Anxiety   . Arthritis   . Asthma   . DVT (deep venous thrombosis) ([place]) 2008  . Essential hypertension   . GERD (gastroesophageal reflux disease)   . History of kidney stones   . Hyperlipidemia   . Internal hemorrhoids   . Peripheral neuropathy   . Post traumatic stress disorder (PTSD)   . Sleep apnea   . Vertigo     Patient Active Problem List   Diagnosis Date Noted  . Cervical spondylosis with radiculopathy 07/09/2015  . Urinary frequency 02/25/2015  . Dehydration 02/25/2015  . Severe vertigo 02/25/2015  . Hydronephrosis 02/15/2014  . Acute renal failure ([place]) 02/15/2014  . Calculus of ureter 02/14/2014  . Urinary hesitancy 11/17/2013  . Overweight(278.02) 11/17/2013  . Acute pyelonephritis 11/16/2013  . Kidney stones  11/16/2013  . Acute onset of severe vertigo 11/16/2013  . Sepsis secondary to UTI ([place]) 11/16/2013  . Hypertension 11/16/2013  . Hyperlipemia 11/16/2013  . GERD (gastroesophageal reflux disease) 11/16/2013    Past Surgical History:  Procedure Laterality Date  . ANTERIOR CERVICAL DECOMP/DISCECTOMY FUSION N/A 07/09/2015   Procedure: ANTERIOR CERVICAL DECOMPRESSION/DISCECTOMY FUSION CERVICAL FIVE-SIX,CERVICAL SIX-SEVEN;  Surgeon: Newman Pies, MD;  Location: [place] [facility];  Service: Neurosurgery;  Laterality: N/A;  . CARPAL TUNNEL RELEASE Bilateral 2014  . COLONOSCOPY    . CYSTOSCOPY/RETROGRADE/URETEROSCOPY/STONE EXTRACTION WITH BASKET Left 02/14/2014   Procedure: CYSTOSCOPY AND LEFT DOUBLE J STENT PLACEMENT;  ATTEMPTED STONE  EXTRACTION ;  Surgeon: Jorja Loa, MD;  Location: [facility];  Service: Urology;  Laterality: Left;  . ESOPHAGOGASTRODUODENOSCOPY ENDOSCOPY    . KNEE SURGERY Right 2006   Arthroscopy - MCl tear        Home Medications    Prior to Admission medications   Medication Sig Start Date End Date Taking? Authorizing Provider  albuterol (PROVENTIL HFA;VENTOLIN HFA) 108 (90 BASE) MCG/ACT inhaler Inhale 2 puffs into the lungs every 6 (six) hours as needed for wheezing or shortness of breath.    [provider]  Artificial Tear Ointment (DRY EYES OP) Apply 1 application to eye 3 (three) times daily.    [provider]  Biotin (BIOTIN 5000) 5 MG CAPS Take 1 capsule by mouth daily.  [provider]  busPIRone (BUSPAR) 10 MG tablet Take 10 mg by mouth 2 (two) times daily.    [provider]  Cholecalciferol (VITAMIN D3 PO) Take 1 tablet by mouth daily.    [provider]  diazepam (VALIUM) 5 MG tablet Take 1 every 4-6 hours as needed for dizziness 07/31/18   Milton Ferguson, MD  diltiazem (CARDIZEM CD) 180 MG 24 hr capsule Take 1 capsule by mouth at bedtime. 07/21/18   [provider]  gabapentin (NEURONTIN) 400 MG  capsule Take 800 mg by mouth 3 (three) times daily.     [provider]  hydrocortisone (ANUSOL-HC) 25 MG suppository Place 1 suppository (25 mg total) rectally at bedtime. 11/21/16   Irene Shipper, MD  L-Arginine 500 MG CAPS Take 1 capsule by mouth daily.    [provider]  LORazepam (ATIVAN) 0.5 MG tablet Take 1 tablet by mouth 3 (three) times daily as needed.  11/17/16   [provider]  meclizine (ANTIVERT) 25 MG tablet Take 1-2 tablets (25-50 mg total) by mouth 3 (three) times daily as needed for up to 14 days for dizziness. 08/03/18 08/17/18  Dorie Rank, MD  omeprazole (PRILOSEC) 20 MG capsule Take 20 mg by mouth every morning.     [provider]  ondansetron (ZOFRAN ODT) 4 MG disintegrating tablet 4mg  ODT q4 hours prn nausea/vomit 07/31/18   Milton Ferguson, MD  ondansetron (ZOFRAN) 8 MG tablet Take 1 tablet (8 mg total) by mouth every 8 (eight) hours as needed for nausea or vomiting. 05/06/16   [name], MD  promethazine (PHENERGAN) 25 MG tablet Take 25 mg by mouth every 6 (six) hours as needed for nausea or vomiting.    [provider]  psyllium (METAMUCIL) 58.6 % powder Take by mouth. 2 teaspoons daily    [provider]    Family History Family History  Problem Relation Age of Onset  . Hypertension Father   . Hyperlipidemia Father   . Lung cancer Father        from piece of abestos lodged in his lung  . Thyroid cancer Mother   . Colon cancer Brother 51  . Bladder Cancer Brother     Social History Social History   Tobacco Use  . Smoking status: Former Smoker    Types: Cigars  . Smokeless tobacco: Never Used  . Tobacco comment: quit 15 years ago (2017) never smoked alot  Substance Use Topics  . Alcohol use: No  . Drug use: No     Allergies   Other; Tamsulosin; Codeine; Morphine and related; Pentazocine; Sertraline; and Topiramate   Review of Systems Review of Systems  All other systems reviewed and are  negative.    Physical Exam Updated Vital Signs BP (!) 153/95   Pulse (!) 54   Temp 98.3 F (36.8 C) (Oral)   Resp 12   Ht 1.854 m (6\' 1" )   Wt 86.2 kg   SpO2 97%   BMI 25.07 kg/m   Physical Exam Vitals signs and nursing note reviewed.  Constitutional:      General: He is not in acute distress.    Appearance: He is well-developed.  HENT:     Head: Normocephalic and atraumatic.     Right Ear: External ear normal.     Left Ear: External ear normal.  Eyes:     General: No scleral icterus.       Right eye: No discharge.  Left eye: No discharge.     Conjunctiva/sclera: Conjunctivae normal.  Neck:     Musculoskeletal: Neck supple.     Trachea: No tracheal deviation.  Cardiovascular:     Rate and Rhythm: Normal rate and regular rhythm.  Pulmonary:     Effort: Pulmonary effort is normal. No respiratory distress.     Breath sounds: Normal breath sounds. No stridor. No wheezing or rales.  Abdominal:     General: Bowel sounds are normal. There is no distension.     Palpations: Abdomen is soft.     Tenderness: There is no abdominal tenderness. There is no guarding or rebound.  Musculoskeletal:        General: No tenderness.  Skin:    General: Skin is warm and dry.     Findings: No rash.  Neurological:     Mental Status: He is alert and oriented to person, place, and time.     Cranial Nerves: No cranial nerve deficit (No facial droop, extraocular movements intact, tongue midline ).     Sensory: No sensory deficit.     Motor: No abnormal muscle tone or seizure activity.     Coordination: Coordination normal.     Comments: No pronator drift bilateral upper extrem, able to hold both legs off bed for 5 seconds, sensation intact in all extremities, no visual field cuts, no left or right sided neglect, normal finger-nose exam bilaterally, no nystagmus noted       ED Treatments / Results  Labs (all labs ordered are listed, but only abnormal results are displayed) Labs  Reviewed  BASIC METABOLIC PANEL - Abnormal; Notable for the following components:      Result Value   Calcium 8.6 (*)    All other components within normal limits  CBC     Procedures Procedures (including critical care time)  Medications Ordered in ED Medications  meclizine (ANTIVERT) tablet 50 mg (50 mg Oral Given 08/03/18 1512)  sodium chloride 0.9 % bolus 1,000 mL (1,000 mLs Intravenous New Bag/Given 08/03/18 1735)     Initial Impression / Assessment and Plan / ED Course  I have reviewed the triage vital signs and the nursing notes.  Pertinent labs & imaging results that were available during my care of the patient were reviewed by me and considered in my medical decision making (see chart for details).   Patient presented to the emergency room with recurrent vertigo symptoms.  Patient has a long history of having issues with this.  He has seen multiple doctors over the years.  Only recently has had a recurrent exacerbation.  Patient's neurologic exam is reassuring.  No signs to suggest stroke.  Laboratory tests are unremarkable.  Patient was given meclizine at triage.  It has helped significantly.  Patient feels ready for discharge and would like a prescription for meclizine.  Final Clinical Impressions(s) / ED Diagnoses   Final diagnoses:  Vertigo    ED Discharge Orders         Ordered    meclizine (ANTIVERT) 25 MG tablet  3 times daily PRN     08/03/18 1841           Dorie Rank, MD 08/03/18 1843

## 2018-08-03 NOTE — Progress Notes (Signed)
Cardiology Office Note  Date: 08/04/2018   ID: [name], [name] Jun 03, 1946, MRN [id number]  PCP: Sharilyn Sites, MD  Primary Cardiologist: Rozann Lesches, MD   Chief Complaint  Patient presents with  . ER follow-up    History of Present Illness: Andrew Bates is a 73 y.o. male seen in consultation back in December 2017.  He is referred back to the office after recent ER visits with severe vertigo.  He has a longstanding history of vertigo and is undergone evaluation by multiple specialists over the years including at [facility], has a neurologist in [place] as well.  He has not been found to have a cardiogenic cause of these episodes based on previous assessment.  He states that symptoms are often precipitated by a turn of his head, feels like the room is spinning, this can last for hours, also feels nauseated and has diaphoresis.  He also tells me that he has had some left-sided chest discomfort not necessarily associated with the symptoms, but sometimes present.  No sense of palpitations.  No syncope.  Does not appear to be a clear exertional component to his chest pain.  Sometimes feels a pain on the left side of his neck and jaw.  Exercise Myoview from December 2017 was low risk, Duke treadmill score of 5, soft tissue attenuation noted, no perfusion evidence ischemia.  Echocardiogram at that time revealed LVEF 50-55% with grade 1 diastolic dysfunction and no major valvular abnormalities.  He did have a screening chest CT in June 2018 which incidentally noted presence of coronary artery calcifications.  Past Medical History:  Diagnosis Date  . Adenomatous colon polyp   . Anxiety   . Arthritis   . Asthma   . DVT (deep venous thrombosis) ([place]) 2008  . Essential hypertension   . GERD (gastroesophageal reflux disease)   . History of kidney stones   . Hyperlipidemia   . Internal hemorrhoids   . Peripheral neuropathy   . Post traumatic stress disorder (PTSD)   . Sleep apnea   .  Vertigo     Past Surgical History:  Procedure Laterality Date  . ANTERIOR CERVICAL DECOMP/DISCECTOMY FUSION N/A 07/09/2015   Procedure: ANTERIOR CERVICAL DECOMPRESSION/DISCECTOMY FUSION CERVICAL FIVE-SIX,CERVICAL SIX-SEVEN;  Surgeon: Newman Pies, MD;  Location: [facility];  Service: Neurosurgery;  Laterality: N/A;  . CARPAL TUNNEL RELEASE Bilateral 2014  . COLONOSCOPY    . CYSTOSCOPY/RETROGRADE/URETEROSCOPY/STONE EXTRACTION WITH BASKET Left 02/14/2014   Procedure: CYSTOSCOPY AND LEFT DOUBLE J STENT PLACEMENT;  ATTEMPTED STONE  EXTRACTION ;  Surgeon: Jorja Loa, MD;  Location: [facility];  Service: Urology;  Laterality: Left;  . ESOPHAGOGASTRODUODENOSCOPY ENDOSCOPY    . KNEE SURGERY Right 2006   Arthroscopy - MCl tear    Current Outpatient Medications  Medication Sig Dispense Refill  . albuterol (PROVENTIL HFA;VENTOLIN HFA) 108 (90 BASE) MCG/ACT inhaler Inhale 2 puffs into the lungs every 6 (six) hours as needed for wheezing or shortness of breath.    . Artificial Tear Ointment (DRY EYES OP) Apply 1 application to eye 3 (three) times daily.    . Biotin (BIOTIN 5000) 5 MG CAPS Take 1 capsule by mouth daily.    . busPIRone (BUSPAR) 10 MG tablet Take 10 mg by mouth 2 (two) times daily.    . Cholecalciferol (VITAMIN D3 PO) Take 1 tablet by mouth daily.    . diazepam (VALIUM) 5 MG tablet Take 1 every 4-6 hours as needed for dizziness 15 tablet 0  .  diltiazem (CARDIZEM CD) 180 MG 24 hr capsule Take 1 capsule by mouth at bedtime.    . gabapentin (NEURONTIN) 400 MG capsule Take 800 mg by mouth 3 (three) times daily.     . hydrocortisone (ANUSOL-HC) 25 MG suppository Place 1 suppository (25 mg total) rectally at bedtime. 30 suppository 2  . L-Arginine 500 MG CAPS Take 1 capsule by mouth daily.    Marland Kitchen LORazepam (ATIVAN) 0.5 MG tablet Take 1 tablet by mouth 3 (three) times daily as needed.     . meclizine (ANTIVERT) 25 MG tablet Take 1-2 tablets (25-50 mg total) by mouth 3 (three) times daily  as needed for up to 14 days for dizziness. 42 tablet 1  . omeprazole (PRILOSEC) 20 MG capsule Take 20 mg by mouth every morning.     . ondansetron (ZOFRAN ODT) 4 MG disintegrating tablet 4mg  ODT q4 hours prn nausea/vomit 20 tablet 0  . ondansetron (ZOFRAN) 8 MG tablet Take 1 tablet (8 mg total) by mouth every 8 (eight) hours as needed for nausea or vomiting. 20 tablet 0  . promethazine (PHENERGAN) 25 MG tablet Take 25 mg by mouth every 6 (six) hours as needed for nausea or vomiting.    . psyllium (METAMUCIL) 58.6 % powder Take by mouth. 2 teaspoons daily     No current facility-administered medications for this visit.    Allergies:  Other; Tamsulosin; Codeine; Morphine and related; Pentazocine; Sertraline; and Topiramate   Social History: The patient  reports that he has quit smoking. His smoking use included cigars. He has never used smokeless tobacco. He reports that he does not drink alcohol or use drugs.   ROS:  Please see the history of present illness. Otherwise, complete review of systems is positive for none.  All other systems are reviewed and negative.   Physical Exam: VS:  BP (!) 142/78   Pulse 60   Ht 6\' 1"  (1.854 m)   Wt 200 lb (90.7 kg)   SpO2 98%   BMI 26.39 kg/m , BMI Body mass index is 26.39 kg/m.  Wt Readings from Last 3 Encounters:  08/04/18 200 lb (90.7 kg)  08/03/18 190 lb (86.2 kg)  07/31/18 190 lb (86.2 kg)    General: Patient appears comfortable at rest. HEENT: Conjunctiva and lids normal, oropharynx clear. Neck: Supple, no elevated JVP or carotid bruits, no thyromegaly. Lungs: Clear to auscultation, nonlabored breathing at rest. Cardiac: Regular rate and rhythm, no S3 or significant systolic murmur, no pericardial rub. Abdomen: Soft, nontender, bowel sounds present. Extremities: No pitting edema, distal pulses 2+. Skin: Warm and dry. Musculoskeletal: No kyphosis. Neuropsychiatric: Alert and oriented x3, affect grossly appropriate.  ECG: I personally  reviewed the tracing from 07/31/2018 which showed sinus rhythm with IVCD.  Recent Labwork: 07/31/2018: ALT 19; AST 16 08/03/2018: BUN 18; Creatinine, Ser 1.10; Hemoglobin 15.3; Platelets 271; Potassium 3.6; Sodium 138   Other Studies Reviewed Today:  Chest CT 12/01/2016: IMPRESSION: 1. Lung-RADS 1, negative. Continue annual screening with low-dose chest CT without contrast in 12 months. 2. The "S" modifier above refers to potentially clinically significant non lung cancer related findings. Specifically, there is aortic atherosclerosis, in addition to left main and 2 vessel coronary artery disease. Please note that although the presence of coronary artery calcium documents the presence of coronary artery disease, the severity of this disease and any potential stenosis cannot be assessed on this non-gated CT examination. Assessment for potential risk factor modification, dietary therapy or pharmacologic therapy may be warranted,  if clinically indicated.  Exercise Myoview 06/13/2016:  No diagnostic ST segment changes to indicate ischemia. Occasional PVCs. Low risk Duke treadmill score 5.  Blood pressure demonstrated a hypertensive response to exercise.  Small, mild intensity, apical inferior and mid inferolateral defect that is fixed and consistent with soft tissue attenuation rather than scar in light of normal wall motion.  This is a low risk study.  Nuclear stress EF: 55%.  Echocardiogram 06/13/2016: Study Conclusions  - Left ventricle: The cavity size was normal. Wall thickness was   increased in a pattern of mild LVH. Systolic function was normal.   The estimated ejection fraction was in the range of 50% to 55%.   Wall motion was normal; there were no regional wall motion   abnormalities. Doppler parameters are consistent with abnormal   left ventricular relaxation (grade 1 diastolic dysfunction). - Aortic valve: Mildly calcified annulus. Trileaflet. There was   trivial  regurgitation. - Mitral valve: Mildly thickened leaflets . There was mild   regurgitation. - Right atrium: Central venous pressure (est): 3 mm Hg. - Tricuspid valve: There was trivial regurgitation. - Pulmonary arteries: PA peak pressure: 31 mm Hg (S). - Pericardium, extracardiac: There was no pericardial effusion.  Impressions:  - Mild LVH with LVEF 50-55% with grade 1 diastolic dysfunction.   Mildly thickened mitral leaflets with mild mitral regurgitation.   Mildly calcified aortic annular calcification with trivial aortic   regurgitation. Trivial tricuspid regurgitation with PASP 31 mmHg.  Assessment and Plan:  1.  Episodes of severe vertigo, this has been a longstanding recurring issue for him with previous work-up at [facility] and also follow-up by neurology in [place].  Description of events does not sound specifically arrhythmogenic.  He has not had frank syncope.  I reviewed his recent ECG.  I did recommend to him that he hydrate better in case there is any vasovagal component.  2.  Intermittent left-sided chest and neck pain, not specifically exertional.  He does have coronary artery calcifications incidentally noted by screening chest CT back in 2018 with baseline history of hypertension and hyperlipidemia.  Last ischemic work-up was 3 years ago, we will follow-up with a Lexiscan Myoview.  Current medicines were reviewed with the patient today.   Orders Placed This Encounter  Procedures  . NM Myocar Multi W/Spect W/Wall Motion / EF    Disposition: Call with test results.  Signed, Satira Sark, MD, [facility] 08/04/2018 11:37 AM    [facility] [place]. [address], [place], [place] [address] Phone: [phone]; Fax: [phone]

## 2018-08-04 ENCOUNTER — Ambulatory Visit (INDEPENDENT_AMBULATORY_CARE_PROVIDER_SITE_OTHER): Payer: PPO | Admitting: Cardiology

## 2018-08-04 ENCOUNTER — Encounter: Payer: Self-pay | Admitting: Cardiology

## 2018-08-04 VITALS — BP 142/78 | HR 60 | Ht 73.0 in | Wt 200.0 lb

## 2018-08-04 DIAGNOSIS — I2510 Atherosclerotic heart disease of native coronary artery without angina pectoris: Secondary | ICD-10-CM | POA: Diagnosis not present

## 2018-08-04 DIAGNOSIS — R42 Dizziness and giddiness: Secondary | ICD-10-CM | POA: Diagnosis not present

## 2018-08-04 DIAGNOSIS — R072 Precordial pain: Secondary | ICD-10-CM

## 2018-08-04 NOTE — Patient Instructions (Signed)
Medication Instructions: Your physician recommends that you continue on your current medications as directed. Please refer to the Current Medication list given to you today.   Labwork: None today  Procedures/Testing: Your physician has requested that you have a lexiscan myoview. For further information please visit [email or web address]. Please follow instruction sheet, as given.    Follow-Up: We will call you with results  Any Additional Special Instructions Will Be Listed Below (If Applicable).     If you need a refill on your cardiac medications before your next appointment, please call your pharmacy.   

## 2018-08-05 ENCOUNTER — Ambulatory Visit (INDEPENDENT_AMBULATORY_CARE_PROVIDER_SITE_OTHER): Payer: PPO | Admitting: Otolaryngology

## 2018-08-05 DIAGNOSIS — R42 Dizziness and giddiness: Secondary | ICD-10-CM | POA: Diagnosis not present

## 2018-08-05 DIAGNOSIS — H903 Sensorineural hearing loss, bilateral: Secondary | ICD-10-CM | POA: Diagnosis not present

## 2018-08-11 ENCOUNTER — Encounter (HOSPITAL_COMMUNITY)
Admission: RE | Admit: 2018-08-11 | Discharge: 2018-08-11 | Disposition: A | Discharge status: Home / Self Care | Payer: PPO | Source: Ambulatory Visit | Attending: Cardiology | Admitting: Cardiology

## 2018-08-11 ENCOUNTER — Ambulatory Visit (HOSPITAL_COMMUNITY)
Admission: RE | Admit: 2018-08-11 | Discharge: 2018-08-11 | Disposition: A | Discharge status: Home / Self Care | Payer: PPO | Source: Ambulatory Visit | Attending: Cardiology | Admitting: Cardiology

## 2018-08-11 DIAGNOSIS — R072 Precordial pain: Secondary | ICD-10-CM

## 2018-08-11 DIAGNOSIS — I2510 Atherosclerotic heart disease of native coronary artery without angina pectoris: Secondary | ICD-10-CM | POA: Diagnosis not present

## 2018-08-11 LAB — NM MYOCAR MULTI W/SPECT W/WALL MOTION / EF
LV dias vol: 114 mL (ref 62–150)
LV sys vol: 58 mL
Peak HR: 86 {beats}/min
RATE: 0.37
Rest HR: 61 {beats}/min
SDS: 5
SRS: 7
SSS: 12
TID: 1.09

## 2018-08-11 MED ORDER — REGADENOSON 0.4 MG/5ML IV SOLN
INTRAVENOUS | Status: AC
  Administered 2018-08-11: 0.4 mg via INTRAVENOUS
  Filled 2018-08-11: qty 5

## 2018-08-11 MED ORDER — TECHNETIUM TC 99M TETROFOSMIN IV KIT
30.0000 | PACK | Freq: Once | INTRAVENOUS | Status: AC | PRN
  Administered 2018-08-11: 10.86 via INTRAVENOUS

## 2018-08-11 MED ORDER — TECHNETIUM TC 99M TETROFOSMIN IV KIT
10.0000 | PACK | Freq: Once | INTRAVENOUS | Status: AC | PRN
  Administered 2018-08-11: 33 via INTRAVENOUS

## 2018-08-11 MED ORDER — SODIUM CHLORIDE 0.9% FLUSH
INTRAVENOUS | Status: AC
  Administered 2018-08-11: 10 mL via INTRAVENOUS
  Filled 2018-08-11: qty 10

## 2018-08-17 ENCOUNTER — Observation Stay (HOSPITAL_COMMUNITY)
Admission: EM | Admit: 2018-08-17 | Discharge: 2018-08-18 | Disposition: A | Discharge status: Home / Self Care | Payer: PPO | Attending: Cardiology | Admitting: Cardiology

## 2018-08-17 ENCOUNTER — Encounter (HOSPITAL_COMMUNITY): Payer: Self-pay

## 2018-08-17 ENCOUNTER — Emergency Department (HOSPITAL_COMMUNITY): Payer: PPO

## 2018-08-17 ENCOUNTER — Inpatient Hospital Stay (HOSPITAL_COMMUNITY)
Admission: EM | Disposition: A | Discharge status: Home / Self Care | Payer: Self-pay | Source: Home / Self Care | Attending: Emergency Medicine

## 2018-08-17 ENCOUNTER — Other Ambulatory Visit: Payer: Self-pay

## 2018-08-17 DIAGNOSIS — Z86718 Personal history of other venous thrombosis and embolism: Secondary | ICD-10-CM | POA: Diagnosis not present

## 2018-08-17 DIAGNOSIS — G4730 Sleep apnea, unspecified: Secondary | ICD-10-CM | POA: Insufficient documentation

## 2018-08-17 DIAGNOSIS — I420 Dilated cardiomyopathy: Secondary | ICD-10-CM | POA: Insufficient documentation

## 2018-08-17 DIAGNOSIS — Z885 Allergy status to narcotic agent status: Secondary | ICD-10-CM | POA: Insufficient documentation

## 2018-08-17 DIAGNOSIS — E785 Hyperlipidemia, unspecified: Secondary | ICD-10-CM | POA: Insufficient documentation

## 2018-08-17 DIAGNOSIS — J45909 Unspecified asthma, uncomplicated: Secondary | ICD-10-CM | POA: Diagnosis not present

## 2018-08-17 DIAGNOSIS — F17290 Nicotine dependence, other tobacco product, uncomplicated: Secondary | ICD-10-CM | POA: Diagnosis not present

## 2018-08-17 DIAGNOSIS — N179 Acute kidney failure, unspecified: Secondary | ICD-10-CM | POA: Diagnosis not present

## 2018-08-17 DIAGNOSIS — I25110 Atherosclerotic heart disease of native coronary artery with unstable angina pectoris: Secondary | ICD-10-CM | POA: Diagnosis not present

## 2018-08-17 DIAGNOSIS — I10 Essential (primary) hypertension: Secondary | ICD-10-CM | POA: Diagnosis present

## 2018-08-17 DIAGNOSIS — I2510 Atherosclerotic heart disease of native coronary artery without angina pectoris: Secondary | ICD-10-CM | POA: Diagnosis not present

## 2018-08-17 DIAGNOSIS — Z8249 Family history of ischemic heart disease and other diseases of the circulatory system: Secondary | ICD-10-CM | POA: Diagnosis not present

## 2018-08-17 DIAGNOSIS — I129 Hypertensive chronic kidney disease with stage 1 through stage 4 chronic kidney disease, or unspecified chronic kidney disease: Secondary | ICD-10-CM | POA: Insufficient documentation

## 2018-08-17 DIAGNOSIS — I252 Old myocardial infarction: Secondary | ICD-10-CM | POA: Diagnosis not present

## 2018-08-17 DIAGNOSIS — K219 Gastro-esophageal reflux disease without esophagitis: Secondary | ICD-10-CM | POA: Insufficient documentation

## 2018-08-17 DIAGNOSIS — R079 Chest pain, unspecified: Secondary | ICD-10-CM

## 2018-08-17 DIAGNOSIS — G629 Polyneuropathy, unspecified: Secondary | ICD-10-CM | POA: Diagnosis not present

## 2018-08-17 DIAGNOSIS — I200 Unstable angina: Secondary | ICD-10-CM | POA: Diagnosis present

## 2018-08-17 DIAGNOSIS — N182 Chronic kidney disease, stage 2 (mild): Secondary | ICD-10-CM | POA: Insufficient documentation

## 2018-08-17 DIAGNOSIS — I061 Rheumatic aortic insufficiency: Secondary | ICD-10-CM | POA: Diagnosis not present

## 2018-08-17 DIAGNOSIS — R072 Precordial pain: Secondary | ICD-10-CM

## 2018-08-17 DIAGNOSIS — R42 Dizziness and giddiness: Secondary | ICD-10-CM | POA: Insufficient documentation

## 2018-08-17 DIAGNOSIS — Z79899 Other long term (current) drug therapy: Secondary | ICD-10-CM | POA: Diagnosis not present

## 2018-08-17 DIAGNOSIS — M1990 Unspecified osteoarthritis, unspecified site: Secondary | ICD-10-CM | POA: Diagnosis not present

## 2018-08-17 HISTORY — PX: LEFT HEART CATH AND CORONARY ANGIOGRAPHY: CATH118249

## 2018-08-17 LAB — CBC
HCT: 48.3 % (ref 39.0–52.0)
Hemoglobin: 15.5 g/dL (ref 13.0–17.0)
MCH: 31.7 pg (ref 26.0–34.0)
MCHC: 32.1 g/dL (ref 30.0–36.0)
MCV: 98.8 fL (ref 80.0–100.0)
Platelets: 274 10*3/uL (ref 150–400)
RBC: 4.89 MIL/uL (ref 4.22–5.81)
RDW: 12.3 % (ref 11.5–15.5)
WBC: 12.3 10*3/uL — ABNORMAL HIGH (ref 4.0–10.5)
nRBC: 0 % (ref 0.0–0.2)

## 2018-08-17 LAB — BASIC METABOLIC PANEL
Anion gap: 7 (ref 5–15)
BUN: 13 mg/dL (ref 8–23)
CO2: 27 mmol/L (ref 22–32)
Calcium: 9.2 mg/dL (ref 8.9–10.3)
Chloride: 104 mmol/L (ref 98–111)
Creatinine, Ser: 1.27 mg/dL — ABNORMAL HIGH (ref 0.61–1.24)
GFR calc Af Amer: >60 mL/min (ref >60)
GFR calc non Af Amer: 56 mL/min — ABNORMAL LOW (ref >60)
Glucose, Bld: 107 mg/dL — ABNORMAL HIGH (ref 70–99)
Potassium: 3.8 mmol/L (ref 3.5–5.1)
Sodium: 138 mmol/L (ref 135–145)

## 2018-08-17 LAB — I-STAT TROPONIN, ED: Troponin i, poc: 0 ng/mL (ref 0.00–0.08)

## 2018-08-17 LAB — TROPONIN I: Troponin I: <0.03 ng/mL (ref <0.03)

## 2018-08-17 SURGERY — LEFT HEART CATH AND CORONARY ANGIOGRAPHY
Anesthesia: LOCAL

## 2018-08-17 MED ORDER — SODIUM CHLORIDE 0.9% FLUSH: 3.0000 mL | Freq: Once | INTRAVENOUS | Status: DC

## 2018-08-17 MED ORDER — SODIUM CHLORIDE 0.9 % IV SOLN
INTRAVENOUS | Status: AC
  Administered 2018-08-17: 19:00:00 via INTRAVENOUS

## 2018-08-17 MED ORDER — SODIUM CHLORIDE 0.9 % IV SOLN: 250.0000 mL | INTRAVENOUS | Status: DC | PRN

## 2018-08-17 MED ORDER — SODIUM CHLORIDE 0.9% FLUSH: 3.0000 mL | INTRAVENOUS | Status: DC | PRN

## 2018-08-17 MED ORDER — NITROGLYCERIN 0.4 MG SL SUBL: 0.4000 mg | SUBLINGUAL_TABLET | SUBLINGUAL | Status: DC | PRN

## 2018-08-17 MED ORDER — HEPARIN (PORCINE) IN NACL 1000-0.9 UT/500ML-% IV SOLN
INTRAVENOUS | Status: AC
  Filled 2018-08-17: qty 1000

## 2018-08-17 MED ORDER — BUPROPION HCL ER (XL) 150 MG PO TB24: 150.0000 mg | ORAL_TABLET | Freq: Every day | ORAL | Status: DC

## 2018-08-17 MED ORDER — PROMETHAZINE HCL 25 MG PO TABS: 25.0000 mg | ORAL_TABLET | Freq: Four times a day (QID) | ORAL | Status: DC | PRN

## 2018-08-17 MED ORDER — SODIUM CHLORIDE 0.9% FLUSH: 3.0000 mL | Freq: Two times a day (BID) | INTRAVENOUS | Status: DC

## 2018-08-17 MED ORDER — ASPIRIN 300 MG RE SUPP: 300.0000 mg | RECTAL | Status: AC

## 2018-08-17 MED ORDER — HEPARIN BOLUS VIA INFUSION
4000.0000 [IU] | Freq: Once | INTRAVENOUS | Status: AC
  Administered 2018-08-17: 4000 [IU] via INTRAVENOUS
  Filled 2018-08-17: qty 4000

## 2018-08-17 MED ORDER — SODIUM CHLORIDE 0.9 % WEIGHT BASED INFUSION: 1.0000 mL/kg/h | INTRAVENOUS | Status: DC

## 2018-08-17 MED ORDER — SODIUM CHLORIDE 0.9% FLUSH
3.0000 mL | Freq: Two times a day (BID) | INTRAVENOUS | Status: DC
  Administered 2018-08-17: 3 mL via INTRAVENOUS

## 2018-08-17 MED ORDER — BUSPIRONE HCL 5 MG PO TABS
10.0000 mg | ORAL_TABLET | Freq: Two times a day (BID) | ORAL | Status: DC
  Administered 2018-08-18: 10 mg via ORAL
  Filled 2018-08-17: qty 2

## 2018-08-17 MED ORDER — LIDOCAINE HCL (PF) 1 % IJ SOLN
INTRAMUSCULAR | Status: AC
  Filled 2018-08-17: qty 30

## 2018-08-17 MED ORDER — METOPROLOL TARTRATE 25 MG PO TABS: 25.0000 mg | ORAL_TABLET | Freq: Two times a day (BID) | ORAL | Status: DC

## 2018-08-17 MED ORDER — HEPARIN (PORCINE) IN NACL 1000-0.9 UT/500ML-% IV SOLN
INTRAVENOUS | Status: DC | PRN
  Administered 2018-08-17 (×2): 500 mL

## 2018-08-17 MED ORDER — MIRTAZAPINE 45 MG PO TABS
45.0000 mg | ORAL_TABLET | Freq: Every day | ORAL | Status: DC
  Administered 2018-08-17: 45 mg via ORAL
  Filled 2018-08-17: qty 1
  Filled 2018-08-17: qty 3

## 2018-08-17 MED ORDER — LIDOCAINE HCL (PF) 1 % IJ SOLN
INTRAMUSCULAR | Status: DC | PRN
  Administered 2018-08-17: 15 mL
  Administered 2018-08-17: 2 mL

## 2018-08-17 MED ORDER — SODIUM CHLORIDE 0.9 % WEIGHT BASED INFUSION: 3.0000 mL/kg/h | INTRAVENOUS | Status: DC

## 2018-08-17 MED ORDER — BUSPIRONE HCL 10 MG PO TABS: 10.0000 mg | ORAL_TABLET | Freq: Two times a day (BID) | ORAL | Status: DC

## 2018-08-17 MED ORDER — ASPIRIN 81 MG PO CHEW
324.0000 mg | CHEWABLE_TABLET | ORAL | Status: AC
  Administered 2018-08-17: 324 mg via ORAL
  Filled 2018-08-17: qty 4

## 2018-08-17 MED ORDER — BUPROPION HCL ER (XL) 150 MG PO TB24
150.0000 mg | ORAL_TABLET | Freq: Every day | ORAL | Status: DC
  Administered 2018-08-18: 150 mg via ORAL
  Filled 2018-08-17: qty 1

## 2018-08-17 MED ORDER — VERAPAMIL HCL 2.5 MG/ML IV SOLN
INTRAVENOUS | Status: AC
  Filled 2018-08-17: qty 2

## 2018-08-17 MED ORDER — METOPROLOL TARTRATE 25 MG PO TABS
25.0000 mg | ORAL_TABLET | Freq: Two times a day (BID) | ORAL | Status: DC
  Administered 2018-08-17 – 2018-08-18 (×2): 25 mg via ORAL
  Filled 2018-08-17 (×2): qty 1

## 2018-08-17 MED ORDER — ACETAMINOPHEN 325 MG PO TABS: 650.0000 mg | ORAL_TABLET | ORAL | Status: DC | PRN

## 2018-08-17 MED ORDER — HEPARIN (PORCINE) 25000 UT/250ML-% IV SOLN
1050.0000 [IU]/h | INTRAVENOUS | Status: DC
  Administered 2018-08-17: 1050 [IU]/h via INTRAVENOUS
  Filled 2018-08-17: qty 250

## 2018-08-17 MED ORDER — IOHEXOL 350 MG/ML SOLN
INTRAVENOUS | Status: DC | PRN
  Administered 2018-08-17: 85 mL via INTRA_ARTERIAL

## 2018-08-17 MED ORDER — ASPIRIN EC 81 MG PO TBEC
81.0000 mg | DELAYED_RELEASE_TABLET | Freq: Every day | ORAL | Status: DC
  Administered 2018-08-18: 81 mg via ORAL
  Filled 2018-08-17: qty 1

## 2018-08-17 SURGICAL SUPPLY — 16 items
CATH 5FR JL3.5 JR4 ANG PIG MP (CATHETERS) ×1 IMPLANT
CATH INFINITI 5FR JL4 (CATHETERS) ×1 IMPLANT
CLOSURE MYNX CONTROL 5F (Vascular Products) ×1 IMPLANT
DEVICE RAD COMP TR BAND LRG (VASCULAR PRODUCTS) ×1 IMPLANT
GLIDESHEATH SLEND SS 6F .021 (SHEATH) ×1 IMPLANT
GUIDEWIRE INQWIRE 1.5J.035X260 (WIRE) IMPLANT
INQWIRE 1.5J .035X260CM (WIRE) ×2
KIT HEART LEFT (KITS) ×2 IMPLANT
PACK CARDIAC CATHETERIZATION (CUSTOM PROCEDURE TRAY) ×2 IMPLANT
SHEATH PINNACLE 5F 10CM (SHEATH) ×1 IMPLANT
SYR MEDRAD MARK 7 150ML (SYRINGE) ×2 IMPLANT
TRANSDUCER W/STOPCOCK (MISCELLANEOUS) ×2 IMPLANT
TUBING CIL FLEX 10 FLL-RA (TUBING) ×2 IMPLANT
WIRE COUGAR XT STRL 300CM (WIRE) ×1 IMPLANT
WIRE EMERALD 3MM-J .035X150CM (WIRE) ×1 IMPLANT
WIRE HI TORQ VERSACORE-J 145CM (WIRE) ×1 IMPLANT

## 2018-08-17 NOTE — Progress Notes (Signed)
ANTICOAGULATION CONSULT NOTE - Initial Consult  Pharmacy Consult for heparin Indication: chest pain/ACS  Allergies  Allergen Reactions  . Other Nausea And Vomiting and Other (See Comments)    "STRONG" PAIN MEDICATIONS IN GENERAL: HEADACHE  . Tamsulosin Anaphylaxis  . Codeine Nausea And Vomiting    REACTION: Headache  . Morphine And Related Nausea And Vomiting  . Pentazocine Nausea And Vomiting and Other (See Comments)    Headaches  . Sertraline Swelling    Swelling in knee 100 mg  . Topiramate Other (See Comments)    Headache    Patient Measurements:    Vital Signs: Temp: 97.8 F (36.6 C) (02/25 1152) Temp Source: Oral (02/25 1152) BP: 135/82 (02/25 1430) Pulse Rate: 66 (02/25 1430)  Labs: Recent Labs    08/17/18 1208  HGB 15.5  HCT 48.3  PLT 274  CREATININE 1.27*    Estimated Creatinine Clearance: 60.3 mL/min (A) (by C-G formula based on SCr of 1.27 mg/dL (H)).  Assessment: CC/HPI: 73 yo m presenting w CP  PMH: HTN HLD Vertigo Asthma Arthritis   Anticoag: none pta  CV: lexiscan last week shows inferior ischemia  Renal: SCr 1.27  Heme/Onc: H&H 15.5/48.3, Plt 274  Goal of Therapy:  Heparin level 0.3-0.7 units/ml Monitor platelets by anticoagulation protocol: Yes   Plan:  Heparin bolus 4000 units/hr Heparin gtt 1050 units/hr Initial lvl 2300 Daily HL CBC F/U Post LHC today  Levester Fresh, PharmD, BCPS, BCCCP Clinical Pharmacist [phone]  Please check AMION for all [facility] numbers  08/17/2018 3:22 PM

## 2018-08-17 NOTE — ED Notes (Signed)
Pt endorses intermittent L cp x 1 week.  He has been hypertensive.  He went to have an inner ear test today when he started to have pain in back of his head.  He reports he has been taking his BP meds as prescribed but he has been feeling bad for about 4 weeks now.  They can't seem to manage his BP.  He reports at during his test today, he also became diaphoretic.  He was concerned that he is having a stroke.  He is A&Osx 4, in NAD.

## 2018-08-17 NOTE — Plan of Care (Signed)
  Problem: Activity: Goal: Risk for activity intolerance will decrease Outcome: Progressing   Problem: Safety: Goal: Ability to remain free from injury will improve Outcome: Progressing   

## 2018-08-17 NOTE — Interval H&P Note (Signed)
History and Physical Interval Note:  08/17/2018 4:47 PM  Raeford Razor  has presented today for cardiac cath with the diagnosis of unstable angina  The various methods of treatment have been discussed with the patient and family. After consideration of risks, benefits and other options for treatment, the patient has consented to  Procedure(s): LEFT HEART CATH AND CORONARY ANGIOGRAPHY (N/A) as a surgical intervention .  The patient's history has been reviewed, patient examined, no change in status, stable for surgery.  I have reviewed the patient's chart and labs.  Questions were answered to the patient's satisfaction.    Cath Lab Visit (complete for each Cath Lab visit)  Clinical Evaluation Leading to the Procedure:   ACS: No.  Non-ACS:    Anginal Classification: CCS III  Anti-ischemic medical therapy: No Therapy  Non-Invasive Test Results: High-risk stress test findings: cardiac mortality >3%/year  Prior CABG: No previous CABG         Lauree Chandler

## 2018-08-17 NOTE — ED Triage Notes (Signed)
Pt presents for evaluation of chest pain. States had stress test last Friday and was supposed to have follow up testing today that he was unable to finish because of his symptoms. Pt reports dizziness, nausea and feeling dehydrated today. Reports blood pressure is high as well and feels his vision is messed up.

## 2018-08-17 NOTE — H&P (Addendum)
Cardiology Admission History and Physical:   Patient ID: Andrew Bates MRN: [id number]; DOB: December 25, 1945   Admission date: 08/17/2018  Primary Care Provider: Sharilyn Sites, MD Primary Cardiologist: Rozann Lesches, MD  Primary Electrophysiologist:  None   Chief Complaint:  Chest pain  Patient Profile:   Andrew Bates is a 73 y.o. male with PMH of HTN, HL, Vertigo, Arthritis and Asthma who presented with hypertension and chest pain.  History of Present Illness:   Andrew Bates is a 73 yo male with PMH noted above. Reports he is followed by his PCP. Had been on statin in the past but this was stopped about a year ago b/c his numbers were good. He struggles with severe vertigo. Was referred to Dr. Domenic Polite after a recent admission with chest pain and vertigo. Seen in the office on 2/12 and underwent a stress test last week. Had not received the results yet. This morning was having testing done for his vertigo when he became hypertensive and diaphoretic. Testing was stopped. He called the office and was referred to the ED with symptoms. In talking with him, he has been having intermittent left sided chest pain/pressure for the past couple of weeks. Noted with rest and exertion. Starts on the left side and moves to the right. Sometimes there at night and wakes him from sleep. Stress testing actually was abnormal with ischemia in the inferior/inferoseptal myocardium with peri-infarct ischemia.   In the ED his labs showed stable electrolytes, Cr 1.27, Hgb 15.5. EKG-SR without ischemia. Still having mild chest discomfort at the time of exam.    Past Medical History:  Diagnosis Date  . Adenomatous colon polyp   . Anxiety   . Arthritis   . Asthma   . DVT (deep venous thrombosis) ([place]) 2008  . Essential hypertension   . GERD (gastroesophageal reflux disease)   . History of kidney stones   . Hyperlipidemia   . Internal hemorrhoids   . Peripheral neuropathy   . Post traumatic stress disorder  (PTSD)   . Sleep apnea   . Vertigo     Past Surgical History:  Procedure Laterality Date  . ANTERIOR CERVICAL DECOMP/DISCECTOMY FUSION N/A 07/09/2015   Procedure: ANTERIOR CERVICAL DECOMPRESSION/DISCECTOMY FUSION CERVICAL FIVE-SIX,CERVICAL SIX-SEVEN;  Surgeon: Newman Pies, MD;  Location: [facility];  Service: Neurosurgery;  Laterality: N/A;  . CARPAL TUNNEL RELEASE Bilateral 2014  . COLONOSCOPY    . CYSTOSCOPY/RETROGRADE/URETEROSCOPY/STONE EXTRACTION WITH BASKET Left 02/14/2014   Procedure: CYSTOSCOPY AND LEFT DOUBLE J STENT PLACEMENT;  ATTEMPTED STONE  EXTRACTION ;  Surgeon: Jorja Loa, MD;  Location: [facility];  Service: Urology;  Laterality: Left;  . ESOPHAGOGASTRODUODENOSCOPY ENDOSCOPY    . KNEE SURGERY Right 2006   Arthroscopy - MCl tear     Medications Prior to Admission: Prior to Admission medications   Medication Sig Start Date End Date Taking? Authorizing Provider  Artificial Tear Ointment (DRY EYES OP) Apply 1 application to eye as needed (dry eyes).    Yes [provider]  Biotin (BIOTIN 5000) 5 MG CAPS Take 10 mg by mouth daily.    Yes [provider]  buPROPion (WELLBUTRIN XL) 150 MG 24 hr tablet Take 150 mg by mouth daily.   Yes [provider]  busPIRone (BUSPAR) 10 MG tablet Take 10 mg by mouth 2 (two) times daily.   Yes [provider]  diltiazem (CARDIZEM CD) 180 MG 24 hr capsule Take 1 capsule by mouth at bedtime. 07/21/18  Yes [provider]  L-Arginine 500 MG CAPS Take 1 capsule by mouth daily.   Yes [provider]  meclizine (ANTIVERT) 25 MG tablet Take 1-2 tablets (25-50 mg total) by mouth 3 (three) times daily as needed for up to 14 days for dizziness. 08/03/18 08/17/18 Yes Dorie Rank, MD  mirtazapine (REMERON) 45 MG tablet Take 45 mg by mouth at bedtime.   Yes [provider]  omeprazole (PRILOSEC) 20 MG capsule Take 20 mg by mouth every morning.    Yes [provider]  ondansetron  (ZOFRAN ODT) 4 MG disintegrating tablet 4mg  ODT q4 hours prn nausea/vomit Patient taking differently: Take 4 mg by mouth every 4 (four) hours as needed for nausea or vomiting.  07/31/18  Yes Milton Ferguson, MD  promethazine (PHENERGAN) 25 MG tablet Take 25 mg by mouth every 6 (six) hours as needed for nausea or vomiting.   Yes [provider]  psyllium (METAMUCIL) 58.6 % powder Take by mouth. 2 teaspoons daily   Yes [provider]  diazepam (VALIUM) 5 MG tablet Take 1 every 4-6 hours as needed for dizziness Patient not taking: Reported on 08/17/2018 07/31/18   Milton Ferguson, MD  hydrocortisone (ANUSOL-HC) 25 MG suppository Place 1 suppository (25 mg total) rectally at bedtime. Patient not taking: Reported on 08/17/2018 11/21/16   Irene Shipper, MD  ondansetron (ZOFRAN) 8 MG tablet Take 1 tablet (8 mg total) by mouth every 8 (eight) hours as needed for nausea or vomiting. Patient not taking: Reported on 08/17/2018 05/06/16   [name], MD    Allergies:    Allergies  Allergen Reactions  . Other Nausea And Vomiting and Other (See Comments)    "STRONG" PAIN MEDICATIONS IN GENERAL: HEADACHE  . Tamsulosin Anaphylaxis  . Codeine Nausea And Vomiting    REACTION: Headache  . Morphine And Related Nausea And Vomiting  . Pentazocine Nausea And Vomiting and Other (See Comments)    Headaches  . Sertraline Swelling    Swelling in knee 100 mg  . Topiramate Other (See Comments)    Headache    Social History:   Social History   Socioeconomic History  . Marital status: Married    Spouse name: [name]  . Number of children: 2  . Years of education: Not on file  . Highest education level: Not on file  Occupational History  . Occupation: retired/diabled  Social Needs  . Financial resource strain: Not on file  . Food insecurity:    Worry: Not on file    Inability: Not on file  . Transportation needs:    Medical: Not on file    Non-medical: Not on file  Tobacco Use  . Smoking  status: Former Smoker    Types: Cigars  . Smokeless tobacco: Never Used  . Tobacco comment: quit 15 years ago (2017) never smoked alot  Substance and Sexual Activity  . Alcohol use: No  . Drug use: No  . Sexual activity: Not on file  Lifestyle  . Physical activity:    Days per week: Not on file    Minutes per session: Not on file  . Stress: Not on file  Relationships  . Social connections:    Talks on phone: Not on file    Gets together: Not on file    Attends religious service: Not on file    Active member of club or organization: Not on file    Attends meetings of clubs or organizations: Not on file    Relationship  status: Not on file  . Intimate partner violence:    Fear of current or ex partner: Not on file    Emotionally abused: Not on file    Physically abused: Not on file    Forced sexual activity: Not on file  Other Topics Concern  . Not on file  Social History Narrative  . Not on file    Family History:   The patient's family history includes Bladder Cancer in his brother; Colon cancer (age of onset: 56) in his brother; Hyperlipidemia in his father; Hypertension in his father; Lung cancer in his father; Thyroid cancer in his mother.    ROS:  Please see the history of present illness.  All other ROS reviewed and negative.     Physical Exam/Data:   Vitals:   08/17/18 1345 08/17/18 1400 08/17/18 1415 08/17/18 1430  BP: 128/87 133/90 133/85 135/82  Pulse: 65 67 65 66  Resp: (!) 21 (!) 22 (!) 22 (!) 26  Temp:      TempSrc:      SpO2: 96% 97% 96% 100%   No intake or output data in the 24 hours ending 08/17/18 1453 Last 3 Weights 08/04/2018 08/03/2018 07/31/2018  Weight (lbs) 200 lb 190 lb 190 lb  Weight (kg) 90.719 kg 86.183 kg 86.183 kg     There is no height or weight on file to calculate BMI.  General:  Well nourished, well developed, in no acute distress HEENT: normal Lymph: no adenopathy Neck: no JVD Endocrine:  No thryomegaly Vascular: No carotid  bruits; FA pulses 2+ bilaterally without bruits  Cardiac:  normal S1, S2; RRR; no murmur  Lungs:  clear to auscultation bilaterally, no wheezing, rhonchi or rales  Abd: soft, nontender, no hepatomegaly  Ext: no edema Musculoskeletal:  No deformities, BUE and BLE strength normal and equal Skin: warm and dry  Neuro:  CNs 2-12 intact, no focal abnormalities noted Psych:  Normal affect    EKG:  The ECG that was done 08/17/2018 was personally reviewed and demonstrates SR  Relevant CV Studies:  Lexiscan: 08/11/2018  There was no ST segment deviation noted during stress.  Findings consistent with large prior inferior/inferoseptal myocardial infarction with moderate peri-infarct ischemia.  This is an intermediate risk study.  The left ventricular ejection fraction is mildly decreased (49%).   Laboratory Data:  Chemistry Recent Labs  Lab 08/17/18 1208  NA 138  K 3.8  CL 104  CO2 27  GLUCOSE 107*  BUN 13  CREATININE 1.27*  CALCIUM 9.2  GFRNONAA 56*  GFRAA >60  ANIONGAP 7    No results for input(s): PROT, ALBUMIN, AST, ALT, ALKPHOS, BILITOT in the last 168 hours. Hematology Recent Labs  Lab 08/17/18 1208  WBC 12.3*  RBC 4.89  HGB 15.5  HCT 48.3  MCV 98.8  MCH 31.7  MCHC 32.1  RDW 12.3  PLT 274   Cardiac EnzymesNo results for input(s): TROPONINI in the last 168 hours.  Recent Labs  Lab 08/17/18 1208  TROPIPOC 0.00    BNPNo results for input(s): BNP, PROBNP in the last 168 hours.  DDimer No results for input(s): DDIMER in the last 168 hours.  Radiology/Studies:  Dg Chest 2 View  Result Date: 08/17/2018 CLINICAL DATA:  73 year old with acute chest pain, dizziness, nausea and hypertension. EXAM: CHEST - 2 VIEW COMPARISON:  Low-dose screening CT chest 12/01/2016. Chest x-ray 11/16/2013, 12/05/2008. FINDINGS: Cardiac silhouette normal in size, unchanged. Thoracic aorta mildly tortuous and atherosclerotic, unchanged. Hilar and mediastinal contours  otherwise  unremarkable. Mildly prominent bronchovascular markings diffusely and mild central peribronchial thickening, unchanged. Lungs otherwise clear. No localized airspace consolidation. No pleural effusions. No pneumothorax. Normal pulmonary vascularity. Degenerative changes involving the thoracic spine, thoracic dextroscoliosis and lumbar levoscoliosis as noted previously. IMPRESSION: Stable mild changes of chronic bronchitis and/or asthma. No acute cardiopulmonary disease. Electronically Signed   By: Evangeline Dakin M.D.   On: 08/17/2018 12:42    Assessment and Plan:   Andrew Bates is a 73 y.o. male with PMH of HTN, HL, Vertigo, Arthritis and Asthma who presented with hypertension and chest pain.  1. Unstable Angina: symptoms intermittently for the past couple of weeks. Abnormal Lexiscan last week with inferior ischemia. Stable labs in the ED. Still with chest pressure at the time of exam.  -- plan for cardiac cath today -- The patient understands that risks included but are not limited to stroke (1 in 1000), death (1 in 1000), kidney failure [usually temporary] (1 in 500), bleeding (1 in 200), allergic reaction [possibly serious] (1 in 200).   2. HTN: reports blood pressures have been high at home recently. Was 433 systolic this morning. Stable in the ED.  -- continue home medications  3. HL: was on statin in the past but stopped by PCP as his numbers looks stable. -- check lipids, add statin pending cath  4. Vertigo: followed as anoutpatient.   Severity of Illness: The appropriate patient status for this patient is INPATIENT. Inpatient status is judged to be reasonable and necessary in order to provide the required intensity of service to ensure the patient's safety. The patient's presenting symptoms, physical exam findings, and initial radiographic and laboratory data in the context of their chronic comorbidities is felt to place them at high risk for further clinical deterioration.  Furthermore, it is not anticipated that the patient will be medically stable for discharge from the hospital within 2 midnights of admission. The following factors support the patient status of inpatient.   " The patient's presenting symptoms include chest pain. " The worrisome physical exam findings include normal exam. " The initial radiographic and laboratory data are worrisome because of stable labs, recent abnormal stress test. " The chronic co-morbidities include HTN, HL.   * I certify that at the point of admission it is my clinical judgment that the patient will require inpatient hospital care spanning beyond 2 midnights from the point of admission due to high intensity of service, high risk for further deterioration and high frequency of surveillance required.*    For questions or updates, please contact [facility] Please consult [email or web address] for contact info under   Signed, Reino Bellis, NP  08/17/2018 2:53 PM   Personally seen and examined. Agree with above.  73 year old here with recent abnormal inferior septal wall ischemia on nuclear stress test with occasional left-sided chest discomfort and pressure over the past couple weeks also with severe vertigo that sometimes can cause his "feet and hands to become purple "with the sensation of spinning for 2 to 3 hours in duration here for chest discomfort.  Currently wearing a mask, mild cough.  No fevers chills nausea vomiting syncope bleeding.  GEN: Well nourished, well developed, in no acute distress  HEENT: normal  Neck: no JVD, carotid bruits, or masses Cardiac: RRR; no murmurs, rubs, or gallops,no edema  Respiratory:  clear to auscultation bilaterally, normal work of breathing GI: soft, nontender, nondistended, + BS MS: no deformity or atrophy  Skin: warm and dry, no rash Neuro:  Alert and Oriented x 3, Strength and sensation are intact Psych: euthymic mood, full affect  EKG personally reviewed no ischemic  changes at this time.  Assessment and plan:  Chest discomfort with recent abnormal intermediate risk nuclear stress test with inferior septal ischemia - We will go ahead and proceed with cardiac catheterization.  Risks and benefits have been explained including stroke heart attack death renal impairment bleeding.  He is willing to proceed.  Wife was present for discussion.  Essential hypertension - Seem to be fairly situational.  Currently stable in ED.  Was over 200 this morning.  Hyperlipidemia - If coronary disease is present, add back statin.   Candee Furbish, MD

## 2018-08-17 NOTE — ED Notes (Signed)
RN informed Pt can receive visitor  

## 2018-08-17 NOTE — ED Provider Notes (Signed)
[facility] EMERGENCY DEPARTMENT Provider Note   CSN: [id number] Arrival date & time: 08/17/18  1147    History   Chief Complaint Chief Complaint  Patient presents with  . Chest Pain    HPI Andrew Bates is a 73 y.o. male.     HPI   21yM with CP. He has vertigo which sounds pretty debilitating. He was getting testing related ot this today when he began to fill ill, became diaphoretic and was having pain in his neck and chest. He has been having this CP intermittently for weeks. No dyspnea. No cough. No fever or chills. No unusual leg pain or swelling.   Past Medical History:  Diagnosis Date  . Adenomatous colon polyp   . Anxiety   . Arthritis   . Asthma   . DVT (deep venous thrombosis) ([place]) 2008  . Essential hypertension   . GERD (gastroesophageal reflux disease)   . History of kidney stones   . Hyperlipidemia   . Internal hemorrhoids   . Peripheral neuropathy   . Post traumatic stress disorder (PTSD)   . Sleep apnea   . Vertigo     Patient Active Problem List   Diagnosis Date Noted  . Cervical spondylosis with radiculopathy 07/09/2015  . Urinary frequency 02/25/2015  . Dehydration 02/25/2015  . Severe vertigo 02/25/2015  . Hydronephrosis 02/15/2014  . Acute renal failure ([place]) 02/15/2014  . Calculus of ureter 02/14/2014  . Urinary hesitancy 11/17/2013  . Overweight(278.02) 11/17/2013  . Acute pyelonephritis 11/16/2013  . Kidney stones 11/16/2013  . Acute onset of severe vertigo 11/16/2013  . Sepsis secondary to UTI ([place]) 11/16/2013  . Hypertension 11/16/2013  . Hyperlipemia 11/16/2013  . GERD (gastroesophageal reflux disease) 11/16/2013    Past Surgical History:  Procedure Laterality Date  . ANTERIOR CERVICAL DECOMP/DISCECTOMY FUSION N/A 07/09/2015   Procedure: ANTERIOR CERVICAL DECOMPRESSION/DISCECTOMY FUSION CERVICAL FIVE-SIX,CERVICAL SIX-SEVEN;  Surgeon: Newman Pies, MD;  Location: [facility];  Service: Neurosurgery;   Laterality: N/A;  . CARPAL TUNNEL RELEASE Bilateral 2014  . COLONOSCOPY    . CYSTOSCOPY/RETROGRADE/URETEROSCOPY/STONE EXTRACTION WITH BASKET Left 02/14/2014   Procedure: CYSTOSCOPY AND LEFT DOUBLE J STENT PLACEMENT;  ATTEMPTED STONE  EXTRACTION ;  Surgeon: Jorja Loa, MD;  Location: [facility];  Service: Urology;  Laterality: Left;  . ESOPHAGOGASTRODUODENOSCOPY ENDOSCOPY    . KNEE SURGERY Right 2006   Arthroscopy - MCl tear        Home Medications    Prior to Admission medications   Medication Sig Start Date End Date Taking? Authorizing Provider  Artificial Tear Ointment (DRY EYES OP) Apply 1 application to eye as needed (dry eyes).    Yes [provider]  Biotin (BIOTIN 5000) 5 MG CAPS Take 10 mg by mouth daily.    Yes [provider]  buPROPion (WELLBUTRIN XL) 150 MG 24 hr tablet Take 150 mg by mouth daily.   Yes [provider]  busPIRone (BUSPAR) 10 MG tablet Take 10 mg by mouth 2 (two) times daily.   Yes [provider]  diltiazem (CARDIZEM CD) 180 MG 24 hr capsule Take 1 capsule by mouth at bedtime. 07/21/18  Yes [provider]  L-Arginine 500 MG CAPS Take 1 capsule by mouth daily.   Yes [provider]  meclizine (ANTIVERT) 25 MG tablet Take 1-2 tablets (25-50 mg total) by mouth 3 (three) times daily as needed for up to 14 days for dizziness. 08/03/18 08/17/18 Yes Dorie Rank, MD  mirtazapine (REMERON)  45 MG tablet Take 45 mg by mouth at bedtime.   Yes [provider]  omeprazole (PRILOSEC) 20 MG capsule Take 20 mg by mouth every morning.    Yes [provider]  ondansetron (ZOFRAN ODT) 4 MG disintegrating tablet 4mg  ODT q4 hours prn nausea/vomit Patient taking differently: Take 4 mg by mouth every 4 (four) hours as needed for nausea or vomiting.  07/31/18  Yes Milton Ferguson, MD  promethazine (PHENERGAN) 25 MG tablet Take 25 mg by mouth every 6 (six) hours as needed for nausea or vomiting.   Yes [provider]  psyllium (METAMUCIL) 58.6 % powder Take by mouth. 2 teaspoons daily   Yes [provider]  diazepam (VALIUM) 5 MG tablet Take 1 every 4-6 hours as needed for dizziness Patient not taking: Reported on 08/17/2018 07/31/18   Milton Ferguson, MD  hydrocortisone (ANUSOL-HC) 25 MG suppository Place 1 suppository (25 mg total) rectally at bedtime. Patient not taking: Reported on 08/17/2018 11/21/16   Irene Shipper, MD  ondansetron (ZOFRAN) 8 MG tablet Take 1 tablet (8 mg total) by mouth every 8 (eight) hours as needed for nausea or vomiting. Patient not taking: Reported on 08/17/2018 05/06/16   [name], MD    Family History Family History  Problem Relation Age of Onset  . Hypertension Father   . Hyperlipidemia Father   . Lung cancer Father        from piece of abestos lodged in his lung  . Thyroid cancer Mother   . Colon cancer Brother 64  . Bladder Cancer Brother     Social History Social History   Tobacco Use  . Smoking status: Former Smoker    Types: Cigars  . Smokeless tobacco: Never Used  . Tobacco comment: quit 15 years ago (2017) never smoked alot  Substance Use Topics  . Alcohol use: No  . Drug use: No     Allergies   Other; Tamsulosin; Codeine; Morphine and related; Pentazocine; Sertraline; and Topiramate   Review of Systems Review of Systems  All systems reviewed and negative, other than as noted in HPI.  Physical Exam Updated Vital Signs BP 140/90   Pulse 72   Temp 97.8 F (36.6 C) (Oral)   Resp 15   SpO2 99%   Physical Exam Vitals signs and nursing note reviewed.  Constitutional:      General: He is not in acute distress.    Appearance: He is well-developed.  HENT:     Head: Normocephalic and atraumatic.  Eyes:     General:        Right eye: No discharge.        Left eye: No discharge.     Conjunctiva/sclera: Conjunctivae normal.  Neck:     Musculoskeletal: Neck supple.  Cardiovascular:     Rate and Rhythm: Normal rate  and regular rhythm.     Heart sounds: Normal heart sounds. No murmur. No friction rub. No gallop.   Pulmonary:     Effort: Pulmonary effort is normal. No respiratory distress.     Breath sounds: Normal breath sounds.  Abdominal:     General: There is no distension.     Palpations: Abdomen is soft.     Tenderness: There is no abdominal tenderness.  Musculoskeletal:        General: No tenderness.     Comments: Lower extremities symmetric as compared to each other. No calf tenderness. Negative Homan's. No palpable cords.   Skin:  General: Skin is warm and dry.  Neurological:     General: No focal deficit present.     Mental Status: He is alert and oriented to person, place, and time.     Cranial Nerves: No cranial nerve deficit.     Motor: No weakness.  Psychiatric:        Behavior: Behavior normal.        Thought Content: Thought content normal.      ED Treatments / Results  Labs (all labs ordered are listed, but only abnormal results are displayed) Labs Reviewed  BASIC METABOLIC PANEL - Abnormal; Notable for the following components:      Result Value   Glucose, Bld 107 (*)    Creatinine, Ser 1.27 (*)    GFR calc non Af Amer 56 (*)    All other components within normal limits  CBC - Abnormal; Notable for the following components:   WBC 12.3 (*)    All other components within normal limits  TROPONIN I  CBC  I-STAT TROPONIN, ED    EKG EKG Interpretation  Date/Time:  Tuesday August 17 2018 11:55:36 EST Ventricular Rate:  76 PR Interval:  130 QRS Duration: 110 QT Interval:  368 QTC Calculation: 414 R Axis:   -12 Text Interpretation:  Normal sinus rhythm Non-specific ST-t changes Confirmed by Virgel Manifold [phone]) on 08/17/2018 12:19:12 PM   Radiology Dg Chest 2 View  Result Date: 08/17/2018 CLINICAL DATA:  73 year old with acute chest pain, dizziness, nausea and hypertension. EXAM: CHEST - 2 VIEW COMPARISON:  Low-dose screening CT chest 12/01/2016. Chest  x-ray 11/16/2013, 12/05/2008. FINDINGS: Cardiac silhouette normal in size, unchanged. Thoracic aorta mildly tortuous and atherosclerotic, unchanged. Hilar and mediastinal contours otherwise unremarkable. Mildly prominent bronchovascular markings diffusely and mild central peribronchial thickening, unchanged. Lungs otherwise clear. No localized airspace consolidation. No pleural effusions. No pneumothorax. Normal pulmonary vascularity. Degenerative changes involving the thoracic spine, thoracic dextroscoliosis and lumbar levoscoliosis as noted previously. IMPRESSION: Stable mild changes of chronic bronchitis and/or asthma. No acute cardiopulmonary disease. Electronically Signed   By: Evangeline Dakin M.D.   On: 08/17/2018 12:42    Procedures Procedures (including critical care time)  Medications Ordered in ED Medications  sodium chloride flush (NS) 0.9 % injection 3 mL (has no administration in time range)     Initial Impression / Assessment and Plan / ED Course  I have reviewed the triage vital signs and the nursing notes.  Pertinent labs & imaging results that were available during my care of the patient were reviewed by me and considered in my medical decision making (see chart for details).  73 year old male with numerous complaints. He tries to interrelate them all but I do not think they all fit into a cohesive diagnosis. A lot of what he describes seems consistent with vertigo and, from his description, it sounds like it has been extensively evaluated at this point.   Vertigo may give him a sense of uneasiness and not feeling well but it shouldn't cause chest pressure. His description of this sounds atypical for ACS though. Just had stress test Friday which was indeterminant.  Discussed with cardiology. Admit.   Final Clinical Impressions(s) / ED Diagnoses   Final diagnoses:  Chest pain, unspecified type    ED Discharge Orders    None       Virgel Manifold, MD 08/22/18 1256

## 2018-08-17 NOTE — Progress Notes (Signed)
Tr band deflated to 9cc, slight bleeding noted immediately, 3cc reintroduced.  Tr band remains at 12cc.   No s+s of hematoma or ecchymosis.Brisk capillary refill.

## 2018-08-17 NOTE — ED Notes (Signed)
Family at bedside. 

## 2018-08-18 ENCOUNTER — Observation Stay (HOSPITAL_BASED_OUTPATIENT_CLINIC_OR_DEPARTMENT_OTHER): Payer: PPO

## 2018-08-18 ENCOUNTER — Encounter (HOSPITAL_COMMUNITY): Payer: Self-pay | Admitting: Cardiovascular Disease

## 2018-08-18 DIAGNOSIS — F17290 Nicotine dependence, other tobacco product, uncomplicated: Secondary | ICD-10-CM | POA: Diagnosis not present

## 2018-08-18 DIAGNOSIS — I420 Dilated cardiomyopathy: Secondary | ICD-10-CM | POA: Diagnosis not present

## 2018-08-18 DIAGNOSIS — R072 Precordial pain: Secondary | ICD-10-CM | POA: Diagnosis not present

## 2018-08-18 DIAGNOSIS — K219 Gastro-esophageal reflux disease without esophagitis: Secondary | ICD-10-CM | POA: Diagnosis not present

## 2018-08-18 DIAGNOSIS — I351 Nonrheumatic aortic (valve) insufficiency: Secondary | ICD-10-CM | POA: Diagnosis not present

## 2018-08-18 DIAGNOSIS — N182 Chronic kidney disease, stage 2 (mild): Secondary | ICD-10-CM | POA: Diagnosis not present

## 2018-08-18 DIAGNOSIS — I200 Unstable angina: Secondary | ICD-10-CM | POA: Diagnosis not present

## 2018-08-18 DIAGNOSIS — J45909 Unspecified asthma, uncomplicated: Secondary | ICD-10-CM | POA: Diagnosis not present

## 2018-08-18 DIAGNOSIS — R42 Dizziness and giddiness: Secondary | ICD-10-CM

## 2018-08-18 DIAGNOSIS — I129 Hypertensive chronic kidney disease with stage 1 through stage 4 chronic kidney disease, or unspecified chronic kidney disease: Secondary | ICD-10-CM | POA: Diagnosis not present

## 2018-08-18 DIAGNOSIS — I10 Essential (primary) hypertension: Secondary | ICD-10-CM | POA: Diagnosis not present

## 2018-08-18 DIAGNOSIS — I061 Rheumatic aortic insufficiency: Secondary | ICD-10-CM | POA: Diagnosis not present

## 2018-08-18 DIAGNOSIS — G4730 Sleep apnea, unspecified: Secondary | ICD-10-CM | POA: Diagnosis not present

## 2018-08-18 DIAGNOSIS — G629 Polyneuropathy, unspecified: Secondary | ICD-10-CM | POA: Diagnosis not present

## 2018-08-18 DIAGNOSIS — N179 Acute kidney failure, unspecified: Secondary | ICD-10-CM | POA: Diagnosis not present

## 2018-08-18 DIAGNOSIS — E785 Hyperlipidemia, unspecified: Secondary | ICD-10-CM | POA: Diagnosis not present

## 2018-08-18 DIAGNOSIS — I25110 Atherosclerotic heart disease of native coronary artery with unstable angina pectoris: Secondary | ICD-10-CM | POA: Diagnosis not present

## 2018-08-18 LAB — ECHOCARDIOGRAM COMPLETE
Height: 73 in
Weight: 2985.6 oz

## 2018-08-18 LAB — CBC
HEMATOCRIT: 43.2 % (ref 39.0–52.0)
Hemoglobin: 14.4 g/dL (ref 13.0–17.0)
MCH: 31.9 pg (ref 26.0–34.0)
MCHC: 33.3 g/dL (ref 30.0–36.0)
MCV: 95.6 fL (ref 80.0–100.0)
Platelets: 239 10*3/uL (ref 150–400)
RBC: 4.52 MIL/uL (ref 4.22–5.81)
RDW: 12.2 % (ref 11.5–15.5)
WBC: 9.6 10*3/uL (ref 4.0–10.5)
nRBC: 0 % (ref 0.0–0.2)

## 2018-08-18 MED ORDER — ROSUVASTATIN CALCIUM 20 MG PO TABS: 20.0000 mg | ORAL_TABLET | Freq: Every day | ORAL | 4 refills | Status: DC

## 2018-08-18 MED ORDER — METOPROLOL TARTRATE 25 MG PO TABS: 25.0000 mg | ORAL_TABLET | Freq: Two times a day (BID) | ORAL | 4 refills | Status: DC

## 2018-08-18 MED ORDER — ROSUVASTATIN CALCIUM 20 MG PO TABS: 20.0000 mg | ORAL_TABLET | Freq: Every day | ORAL | Status: DC

## 2018-08-18 MED ORDER — NITROGLYCERIN 0.4 MG SL SUBL: 0.4000 mg | SUBLINGUAL_TABLET | SUBLINGUAL | 0 refills | Status: DC | PRN

## 2018-08-18 MED ORDER — ASPIRIN 81 MG PO TBEC: 81.0000 mg | DELAYED_RELEASE_TABLET | Freq: Every day | ORAL | Status: DC

## 2018-08-18 MED ORDER — MECLIZINE HCL 25 MG PO TABS: 25.0000 mg | ORAL_TABLET | Freq: Three times a day (TID) | ORAL | 1 refills | Status: AC | PRN

## 2018-08-18 MED FILL — Verapamil HCl IV Soln 2.5 MG/ML: INTRAVENOUS | Qty: 2 | Status: AC

## 2018-08-18 NOTE — Progress Notes (Signed)
Pt. With TR band. RN attempting to remove and place pressure dressing. Bleeding noted. TR band re-inflated. RN will continue to monitor pt. For changes.

## 2018-08-18 NOTE — Progress Notes (Addendum)
Progress Note  Patient Name: Andrew Bates Date of Encounter: 08/18/2018  Primary Cardiologist: Rozann Lesches, MD   Subjective   Pt reports ongoing left sided chest discomfort. Based on cath yesterday, non-cardiac etiology should be pursued. Reports that the discomfort started in the setting of strong strain with vomiting in relation to what sounds like debilitating vertigo.   Inpatient Medications    Scheduled Meds: . aspirin EC  81 mg Oral Daily  . buPROPion  150 mg Oral Daily  . busPIRone  10 mg Oral BID  . metoprolol tartrate  25 mg Oral BID  . mirtazapine  45 mg Oral QHS  . sodium chloride flush  3 mL Intravenous Once  . sodium chloride flush  3 mL Intravenous Q12H   Continuous Infusions: . sodium chloride     PRN Meds: sodium chloride, acetaminophen, nitroGLYCERIN, promethazine, sodium chloride flush   Vital Signs    Vitals:   08/18/18 0145 08/18/18 0147 08/18/18 0534 08/18/18 0742  BP:  (!) 157/94 129/83 138/81  Pulse:  69 62 61  Resp:  18 20 18   Temp:  99 F (37.2 C) 99.4 F (37.4 C) 98.7 F (37.1 C)  TempSrc:  Oral Oral Oral  SpO2:  99% 96% 98%  Weight: 84.6 kg       Intake/Output Summary (Last 24 hours) at 08/18/2018 0752 Last data filed at 08/18/2018 0744 Gross per 24 hour  Intake 997.7 ml  Output 1000 ml  Net -2.3 ml   Filed Weights   08/18/18 0145  Weight: 84.6 kg    Physical Exam   General: Well developed, well nourished, NAD Skin: Warm, dry, intact  Head: Normocephalic, atraumatic, clear, moist mucus membranes. Neck: Negative for carotid bruits. No JVD Lungs:Clear to ausculation bilaterally. No wheezes, rales, or rhonchi. Breathing is unlabored. Cardiovascular: RRR with S1 S2. No murmurs, rubs, gallops, or LV heave appreciated. Abdomen: Soft, non-tender, non-distended with normoactive bowel sounds. No obvious abdominal masses. MSK: Strength and tone appear normal for age. 5/5 in all extremities Extremities: No edema. No clubbing  or cyanosis. DP/PT pulses 2+ bilaterally Neuro: Alert and oriented. No focal deficits. No facial asymmetry. MAE spontaneously. Psych: Responds to questions appropriately with normal affect.    Labs    Chemistry Recent Labs  Lab 08/17/18 1208  NA 138  K 3.8  CL 104  CO2 27  GLUCOSE 107*  BUN 13  CREATININE 1.27*  CALCIUM 9.2  GFRNONAA 56*  GFRAA >60  ANIONGAP 7    Hematology Recent Labs  Lab 08/17/18 1208 08/18/18 0511  WBC 12.3* 9.6  RBC 4.89 4.52  HGB 15.5 14.4  HCT 48.3 43.2  MCV 98.8 95.6  MCH 31.7 31.9  MCHC 32.1 33.3  RDW 12.3 12.2  PLT 274 239   Cardiac Enzymes Recent Labs  Lab 08/17/18 1510  TROPONINI <0.03    Recent Labs  Lab 08/17/18 1208  TROPIPOC 0.00     BNPNo results for input(s): BNP, PROBNP in the last 168 hours.   DDimer No results for input(s): DDIMER in the last 168 hours.   Radiology    Dg Chest 2 View  Result Date: 08/17/2018 CLINICAL DATA:  73 year old with acute chest pain, dizziness, nausea and hypertension. EXAM: CHEST - 2 VIEW COMPARISON:  Low-dose screening CT chest 12/01/2016. Chest x-ray 11/16/2013, 12/05/2008. FINDINGS: Cardiac silhouette normal in size, unchanged. Thoracic aorta mildly tortuous and atherosclerotic, unchanged. Hilar and mediastinal contours otherwise unremarkable. Mildly prominent bronchovascular markings diffusely and mild  central peribronchial thickening, unchanged. Lungs otherwise clear. No localized airspace consolidation. No pleural effusions. No pneumothorax. Normal pulmonary vascularity. Degenerative changes involving the thoracic spine, thoracic dextroscoliosis and lumbar levoscoliosis as noted previously. IMPRESSION: Stable mild changes of chronic bronchitis and/or asthma. No acute cardiopulmonary disease. Electronically Signed   By: Evangeline Dakin M.D.   On: 08/17/2018 12:42   Telemetry    08/18/2018 NSR- Personally Reviewed  ECG    No new tracing as of 08/18/2018- Personally Reviewed  Cardiac  Studies   Lexiscan: 08/11/2018  There was no ST segment deviation noted during stress.  Findings consistent with large prior inferior/inferoseptal myocardial infarction with moderate peri-infarct ischemia.  This is an intermediate risk study.  The left ventricular ejection fraction is mildly decreased (49%).   Cardiac catheterization 08/17/2018:   Ost Cx to Prox Cx lesion is 20% stenosed.  Dist LM to Ost LAD lesion is 50% stenosed.  Prox LAD lesion is 30% stenosed.  Prox LAD to Mid LAD lesion is 20% stenosed.  The left ventricular systolic function is normal.  LV end diastolic pressure is normal.  The left ventricular ejection fraction is 45-50% by visual estimate.  There is no mitral valve regurgitation.   1. Moderate (40-50%) ostial LAD stenosis. This is a smooth lesion and does not appear to be flow limiting.  2. Mild non-obstructive disease in the proximal Circumflex artery 3. Large dominant RCA with no obvious plaque.  4. Mild segmental LV systolic dysfunction, AJOI=78-67%. Apical hypokinesis.   Recommendations: Medical management of CAD. I do not the in the smooth, moderate narrowing of the ostial/proximal LAD is causing his chest pain. Echo in am to better assess LV systolic function.   Patient Profile     73 y.o. male with PMH of HTN, HL, Vertigo, Arthritis and Asthma who presented with hypertension and chest pain.  Assessment & Plan    1. Unstable angina: Abnormal Lexiscan last week with inferior ischemia>>underwent cath 08/17/2018 which showed moderate non-flow limiting (40-50%) ostial LAD stenosis, mild non-obstructive disease in the proximal LCx, large dominant RCA with no obvious plaque and mild segmental LV systolic dysfunction, EHMC=94-70% with apical hypokinesis.  -Recommendations are for medical management of CAD -Plan for echocardiogram for further assessment of LV function, scheduled for today  -Continue ASA 81, metoprolol 25 -No ACE-I/ARB in the  setting of worsening renal dysfunction -May change to carvedilol 6.25mg   -If abnormal lipid panel, will add statin to regimen  -If depressed LV function, will need HF medication initiation   2. HTN: -Stabilized, 138/81>129/83>157/94  -Recent reports of high readings at home, SBP in the 200's yesterday AM  -On home diltiazem for antihypertensive   3. HLD: -Was previously on statin in the past however this was stopped by PCP  in the setting of improvement  -Will obtain Lipid panel   4. Vertigo: -Sounds very debilitating>>>reports several episodes over the last several weeks in which he had intractable vomiting>>>last episode induced chest discomfort (never had that before) -Was to the neurologist office for inner ear nuclear study on day of presentation, however was unable to tolerate study -Take meclizine PRN -Denies vertigo today  -Unknown trigger     5. Chronic kidney disease stage II: -Creatinine, 1.27 today post cath  -Will monitor closely  -Baseline appears to be in the 1.17-1.3 range    Signed, Kathyrn Drown NP-C [facility] Pager: [phone] 08/18/2018, 7:52 AM     For questions or updates, please contact   Please consult [email or web address] for contact info  under Cardiology/STEMI.  Personally seen and examined. Agree with above.  Still feels poor but overall reassurance from catheterization.  Noncardiac etiology.  Vertigo likely playing a role with associated vomiting.  Continue work-up as outpatient.  Once echocardiogram is performed, should be okay to be discharged.  I would like for him to be on statin therapy because of his mild coronary artery disease, nonobstructive. Will start him back on statin with Crestor 20mg  PO QD. Should be able to get at [facility]. OK to be on ASA 81 as well. Family history and mild CAD.   Candee Furbish, MD

## 2018-08-18 NOTE — Care Management CC44 (Signed)
Condition Code 44 Documentation Completed  Patient Details  Name: Andrew Bates MRN: [id number] Date of Birth: 1945-08-23   Condition Code 44 given:  Yes Patient signature on Condition Code 44 notice:  Yes Documentation of 2 MD's agreement:  Yes Code 44 added to claim:  Yes    Carles Collet, RN 08/18/2018, 11:29 AM

## 2018-08-18 NOTE — Care Management CC44 (Signed)
Condition Code 44 Documentation Completed  Patient Details  Name: CLINTEN HOWK MRN: [id number] Date of Birth: 1945/08/04   Condition Code 44 given:  Yes Patient signature on Condition Code 44 notice:  Yes Documentation of 2 MD's agreement:  Yes Code 44 added to claim:  Yes    Carles Collet, RN 08/18/2018, 11:29 AM

## 2018-08-18 NOTE — Discharge Summary (Addendum)
Discharge Summary    Patient ID: Andrew Bates MRN: [id number]; DOB: 1945/08/20  Admit date: 08/17/2018 Discharge date: 08/18/2018  Primary Care Provider: Sharilyn Sites, MD  Primary Cardiologist: Rozann Lesches, MD   Discharge Diagnoses    Principal Problem:   Unstable angina [facility]) Active Problems:   Acute onset of severe vertigo   Hypertension   Hyperlipemia   Acute renal failure (HCC)   Precordial pain  Allergies Allergies  Allergen Reactions  . Other Nausea And Vomiting and Other (See Comments)    "STRONG" PAIN MEDICATIONS IN GENERAL: HEADACHE  . Tamsulosin Anaphylaxis  . Codeine Nausea And Vomiting    REACTION: Headache  . Morphine And Related Nausea And Vomiting  . Pentazocine Nausea And Vomiting and Other (See Comments)    Headaches  . Sertraline Swelling    Swelling in knee 100 mg  . Topiramate Other (See Comments)    Headache   Diagnostic Studies/Procedures    Cardiac catheterization 08/17/2018:   Ost Cx to Prox Cx lesion is 20% stenosed.  Dist LM to Ost LAD lesion is 50% stenosed.  Prox LAD lesion is 30% stenosed.  Prox LAD to Mid LAD lesion is 20% stenosed.  The left ventricular systolic function is normal.  LV end diastolic pressure is normal.  The left ventricular ejection fraction is 45-50% by visual estimate.  There is no mitral valve regurgitation.   1. Moderate (40-50%) ostial LAD stenosis. This is a smooth lesion and does not appear to be flow limiting.  2. Mild non-obstructive disease in the proximal Circumflex artery 3. Large dominant RCA with no obvious plaque.  4. Mild segmental LV systolic dysfunction, SPQZ=30-07%. Apical hypokinesis.   Recommendations: Medical management of CAD. I do not the in the smooth, moderate narrowing of the ostial/proximal LAD is causing his chest pain. Echo in am to better assess LV systolic function.  _____________  Echocardiogram 08/18/2018: Pending    History of Present Illness     Andrew Bates is a 73 y.o. male with PMH of HTN, HL, Vertigo, Arthritis and Asthma who presented with hypertension and chest pain.  Andrew Bates is a 73 yo male with PMH noted above. Reports he is followed by his PCP. He had been on statin in the past but this was stopped about a year ago b/c his numbers were good. He reports that he struggles with severe vertigo. Was referred to Dr. Domenic Polite after a recent admission with chest pain and vertigo. Seen in the office on 2/12 and underwent a stress test last week in which he had not received the results yet. On day of this hospital presentation, he was having nuclear testing done for his vertigo when he became hypertensive and diaphoretic. Testing was therefore  stopped. He called the cardiology office for assistance and was referred to the ED with symptoms. On presentation, he reported that he had been having intermittent left sided chest pain/pressure for the past couple of weeks. Noted with rest and exertion. He stated that it would start on the left side and it would move to the right. He stated that it would sometimes be there at night and would wake him from sleep. Stress testing actually was abnormal with ischemia in the inferior/inferoseptal myocardium with peri-infarct ischemia once reviewed here in the hospital .  In the ED his labs showed stable electrolytes, Cr 1.27, Hgb 15.5. EKG-SR without ischemia. He continued to have mild chest discomfort at the time of exam.  His troponin was found  to be negative at 0.00, <0.03.   Hospital Course     Given this, he underwent a cardiac catheterization on 08/17/2018.  This revealed moderate 40 to 50% ostial LAD stenosis which did not appear to be flow-limiting, mild nonobstructive disease in the proximal circumflex artery and a large dominant RCA with no obvious plaque.  Mild segmental LV systolic dysfunction with estimated LVEF of 40 to 50% with apical hypokinesis.  Recommendations were for medical management of CAD.  It  was thought that the smooth, moderate narrowing of the ostial/proximal LAD is not causing his chest discomfort.  A follow-up echocardiogram showed normal LV function with acute valve abnormalities.  Other hospital problems include:  1. Unstable angina: Abnormal Lexiscan last week with inferior ischemia>>underwent cath 08/17/2018 which showed moderate non-flow limiting (40-50%) ostial LAD stenosis, mild non-obstructive disease in the proximal LCx, large dominant RCA with no obvious plaque and mild segmental LV systolic dysfunction, KDXI=33-82% with apical hypokinesis.  -Recommendations are for medical management of CAD -Plan for echocardiogram for further assessment of LV function, scheduled for today which showed normal LV function and no valve abnormalities  -Continue ASA 81, metoprolol 25 -No ACE-I/ARB in the setting of worsening renal dysfunction -Add statin to regimen, Crestor 20mg  daily   2. HTN: -Stabilized, 138/81>129/83>157/94  -Recent reports of high readings at home, SBP in the 200's yesterday AM  -On home diltiazem for antihypertensive   3. HLD: -Was previously on statin in the past however this was stopped by PCP in the setting of improvement   4. Vertigo: -Sounds very debilitating>>>reports several episodes over the last several weeks in which he had intractable vomiting>>>last episode induced chest discomfort (never had that before) -Was to the neurologist office for inner ear nuclear study on day of presentation, however was unable to tolerate study -Take meclizine PRN -Denies vertigo today  -Unknown trigger  -Needs to continue OP workup for this>>contact office after discharge    -Will need new prescription for Meclizine at follow up with PCP   5. Chronic kidney disease stage II: -Creatinine, 1.27 today post cath  -Baseline appears to be in the 1.17-1.3 range  -Will have BMET at follow up visit, he is back to baseline creatinine   Consultants: None  The  patient was seen and examined by Dr. Marlou Porch who feels that he is stable and ready for discharge today, 08/18/2018. _____________  Discharge Vitals Blood pressure 120/83, pulse (!) 56, temperature 99 F (37.2 C), temperature source Oral, resp. rate 16, height 6\' 1"  (1.854 m), weight 84.6 kg, SpO2 99 %.  Filed Weights   08/18/18 0145  Weight: 84.6 kg   Labs & Radiologic Studies    CBC Recent Labs    08/17/18 1208 08/18/18 0511  WBC 12.3* 9.6  HGB 15.5 14.4  HCT 48.3 43.2  MCV 98.8 95.6  PLT 274 505   Basic Metabolic Panel Recent Labs    08/17/18 1208  NA 138  K 3.8  CL 104  CO2 27  GLUCOSE 107*  BUN 13  CREATININE 1.27*  CALCIUM 9.2   Cardiac Enzymes Recent Labs    08/17/18 1510  TROPONINI <0.03   Dg Chest 2 View  Result Date: 08/17/2018 CLINICAL DATA:  73 year old with acute chest pain, dizziness, nausea and hypertension. EXAM: CHEST - 2 VIEW COMPARISON:  Low-dose screening CT chest 12/01/2016. Chest x-ray 11/16/2013, 12/05/2008. FINDINGS: Cardiac silhouette normal in size, unchanged. Thoracic aorta mildly tortuous and atherosclerotic, unchanged. Hilar and mediastinal contours otherwise unremarkable. Mildly prominent  bronchovascular markings diffusely and mild central peribronchial thickening, unchanged. Lungs otherwise clear. No localized airspace consolidation. No pleural effusions. No pneumothorax. Normal pulmonary vascularity. Degenerative changes involving the thoracic spine, thoracic dextroscoliosis and lumbar levoscoliosis as noted previously. IMPRESSION: Stable mild changes of chronic bronchitis and/or asthma. No acute cardiopulmonary disease. Electronically Signed   By: Evangeline Dakin M.D.   On: 08/17/2018 12:42   Nm Myocar Multi W/spect W/wall Motion / Ef  Result Date: 08/11/2018  There was no ST segment deviation noted during stress.  Findings consistent with large prior inferior/inferoseptal myocardial infarction with moderate peri-infarct ischemia.   This is an intermediate risk study.  The left ventricular ejection fraction is mildly decreased (49%).    Disposition   Pt is being discharged home today in good condition.  Follow-up Plans & Appointments    Follow-up Information    Satira Sark, MD Follow up on 09/02/2018.   Specialty:  Cardiology Why:  Your follow-up appointment is on 09/02/2018 with Dr. Domenic Polite at 2 PM. Contact information: [address] [place] [address] [phone]          Discharge Instructions    Call MD for:  difficulty breathing, headache or visual disturbances   Complete by:  As directed    Call MD for:  extreme fatigue   Complete by:  As directed    Call MD for:  hives   Complete by:  As directed    Call MD for:  persistant dizziness or light-headedness   Complete by:  As directed    Call MD for:  persistant nausea and vomiting   Complete by:  As directed    Call MD for:  redness, tenderness, or signs of infection (pain, swelling, redness, odor or green/yellow discharge around incision site)   Complete by:  As directed    Call MD for:  severe uncontrolled pain   Complete by:  As directed    Call MD for:  temperature >100.4   Complete by:  As directed    Diet - low sodium heart healthy   Complete by:  As directed    Discharge instructions   Complete by:  As directed    No driving for 3 days. No lifting over 5 lbs for 1 week. No sexual activity for 1 week. Keep procedure site clean & dry. If you notice increased pain, swelling, bleeding or pus, call/return!  You may shower, but no soaking baths/hot tubs/pools for 1 week.   If you notice any bleeding such as blood in stool, black tarry stools, blood in urine, nosebleeds or any other unusual bleeding, call your doctor immediately. It is not normal to have this kind of bleeding while on a blood thinner and usually indicates there is an underlying problem with one of your body systems that needs to be checked out.   Please follow up  with your PCP and neurologist for vertigo symptoms   Increase activity slowly   Complete by:  As directed      Discharge Medications   Allergies as of 08/18/2018      Reactions   Other Nausea And Vomiting, Other (See Comments)   "STRONG" PAIN MEDICATIONS IN GENERAL: HEADACHE   Tamsulosin Anaphylaxis   Codeine Nausea And Vomiting   REACTION: Headache   Morphine And Related Nausea And Vomiting   Pentazocine Nausea And Vomiting, Other (See Comments)   Headaches   Sertraline Swelling   Swelling in knee 100 mg   Topiramate Other (See Comments)  Headache      Medication List    TAKE these medications   aspirin 81 MG EC tablet Take 1 tablet (81 mg total) by mouth daily. Start taking on:  August 19, 2018   BIOTIN 5000 5 MG Caps Generic drug:  Biotin Take 10 mg by mouth daily.   buPROPion 150 MG 24 hr tablet Commonly known as:  WELLBUTRIN XL Take 150 mg by mouth daily.   busPIRone 10 MG tablet Commonly known as:  BUSPAR Take 10 mg by mouth 2 (two) times daily.   diazepam 5 MG tablet Commonly known as:  VALIUM Take 1 every 4-6 hours as needed for dizziness   diltiazem 180 MG 24 hr capsule Commonly known as:  CARDIZEM CD Take 1 capsule by mouth at bedtime.   DRY EYES OP Apply 1 application to eye as needed (dry eyes).   hydrocortisone 25 MG suppository Commonly known as:  ANUSOL-HC Place 1 suppository (25 mg total) rectally at bedtime.   L-Arginine 500 MG Caps Take 1 capsule by mouth daily.   meclizine 25 MG tablet Commonly known as:  ANTIVERT Take 1-2 tablets (25-50 mg total) by mouth 3 (three) times daily as needed for up to 14 days for dizziness.   metoprolol tartrate 25 MG tablet Commonly known as:  LOPRESSOR Take 1 tablet (25 mg total) by mouth 2 (two) times daily.   mirtazapine 45 MG tablet Commonly known as:  REMERON Take 45 mg by mouth at bedtime.   nitroGLYCERIN 0.4 MG SL tablet Commonly known as:  NITROSTAT Place 1 tablet (0.4 mg total) under  the tongue every 5 (five) minutes x 3 doses as needed for chest pain.   omeprazole 20 MG capsule Commonly known as:  PRILOSEC Take 20 mg by mouth every morning.   ondansetron 4 MG disintegrating tablet Commonly known as:  ZOFRAN ODT 4mg  ODT q4 hours prn nausea/vomit What changed:    how much to take  how to take this  when to take this  reasons to take this  additional instructions   ondansetron 8 MG tablet Commonly known as:  ZOFRAN Take 1 tablet (8 mg total) by mouth every 8 (eight) hours as needed for nausea or vomiting.   promethazine 25 MG tablet Commonly known as:  PHENERGAN Take 25 mg by mouth every 6 (six) hours as needed for nausea or vomiting.   psyllium 58.6 % powder Commonly known as:  METAMUCIL Take by mouth. 2 teaspoons daily   rosuvastatin 20 MG tablet Commonly known as:  CRESTOR Take 1 tablet (20 mg total) by mouth daily at 6 PM.        Acute coronary syndrome (MI, NSTEMI, STEMI, etc) this admission?: No.    Outstanding Labs/Studies   BMET  Duration of Discharge Encounter   Greater than 30 minutes including physician time.  Signed, Kathyrn Drown, NP 08/18/2018, 2:31 PM  Personally seen and examined. Agree with above.   73 year old with mild coronary artery disease on catheterization 08/17/2018 medical management.  Problems include mild coronary artery disease, hypertension, hyperlipidemia, vertigo, CKD 2  Echocardiogram 08/18/2018:  1. The left ventricle has normal systolic function with an ejection fraction of 60-65%. The cavity size was mildly dilated. Left ventricular diastolic parameters were normal.  2. The mitral valve is normal in structure.  3. The tricuspid valve is normal in structure.  4. The aortic valve is tricuspid Mild calcification of the aortic valve. Aortic valve regurgitation is mild by color flow Doppler.  5.  The pulmonic valve was normal in structure.  6. There is mild dilatation of the aortic root measuring 40  mm.  Vertigo is been an issue.  He has seen several different centers for this. Likely, chest discomfort is noncardiac.  EF also looked normal on echocardiogram.  Prior nuclear stress test showed mildly reduced EF.  Added statin to his regimen 20 mg of Crestor.  High intensity statin to help stabilize his coronary artery disease. Blood pressure looks good on discharge.  Candee Furbish, MD

## 2018-08-18 NOTE — Progress Notes (Signed)
Pt and wife given dc instrucitons/avs, all questions answered entertained , pt insists on walking out , discussed increasing activity slowly esp with groin access, reviewed activity and care of cath sites, wife in attendance

## 2018-08-18 NOTE — Progress Notes (Signed)
  Echocardiogram 2D Echocardiogram has been performed.  Andrew Bates 08/18/2018, 1:56 PM

## 2018-08-18 NOTE — Progress Notes (Addendum)
Initial Nutrition Assessment  DOCUMENTATION CODES:   Not applicable  INTERVENTION:  Ensure Enlive po BID, each supplement provides 350 kcal and 20 grams of protein  NUTRITION DIAGNOSIS:   Inadequate oral intake related to decreased appetite(Under the weather) as evidenced by per patient/family report.   GOAL:   Patient will meet greater than or equal to 90% of their needs   MONITOR:   PO intake, Supplement acceptance  REASON FOR ASSESSMENT:   Malnutrition Screening Tool(MST 2)    ASSESSMENT:   Pt is a 73y M with PMH of HTN, HDL, Severe Vertigo, Arthritis, and Asthma who presented to the ED with HTN and chest pain. Pt is now admitted with unstable angina.   Pt reports that he has a decreased appetite for the last 3-4 weeks. Pt states that his decline of appetite was due to having been sick/feeling under the weather and hasn't felt like eating. On his current intake he has been eating typical sick foods, crackers, soups, ginger ale, etc. Prior to being sick, a typical diet is breakfast consisted of eggs, meat like bacon, etc. Lunch was typically a sandwich, pack of nabs. Dinner depended on if he and wife eat at home or out, but usually consisted of meat, starch, and vegetable. Pt states he doesn't take any meal supplements or MVI's. Pt also states at baseline he doesn't always eat full meals, that he will be outside working etc., and just not eat (gets busy/forgets/etcs.). Pt tray in room was ~75% complete, pt stated he didn't care for the green beans but the spaghetti was great. Meal completion was between 50-100% per chart for this admission.     Pt states that he is aware of the ensure but hasn't tried one yet-- Pt seemed agreeable to try one for supplemental nutrition between meals and at home. Pt also reports that he needs to drink more water, that he came into the hospital dehydrated.   Pt endorses weight Loss; stating he loss 10# (4.6% wt loss) within the last 3-4 weeks which  corroborates with the decreased appetite. UBW has been roughly 195#. Per weight encounters this appears to be within trends of weight gain/loss.   Pt at baseline is active and mobile. States that he likes to be outside and work around the house etc.   Procedures pending:  Left heart Cath and Coronary Angiography; recommended medical management for CAD  Echocardiogram for further assessment of LV function.   Labs & Medications reviewed.    NUTRITION - FOCUSED PHYSICAL EXAM:    Most Recent Value  Orbital Region  No depletion  Upper Arm Region  No depletion  Thoracic and Lumbar Region  No depletion  Buccal Region  No depletion  Temple Region  No depletion  Clavicle Bone Region  No depletion  Clavicle and Acromion Bone Region  No depletion  Scapular Bone Region  No depletion  Dorsal Hand  No depletion  Patellar Region  No depletion  Anterior Thigh Region  No depletion  Posterior Calf Region  No depletion  Edema (RD Assessment)  None  Hair  Reviewed  Eyes  Reviewed  Mouth  Reviewed  Skin  Reviewed  Nails  Reviewed       Diet Order:   Diet Order            Diet - low sodium heart healthy              EDUCATION NEEDS:   No education needs have been identified at  this time  Skin:  Skin Assessment: Reviewed RN Assessment  Last BM:  2/25  Height:   Ht Readings from Last 1 Encounters:  08/18/18 6\' 1"  (1.854 m)    Weight:   Wt Readings from Last 1 Encounters:  08/18/18 84.6 kg    Ideal Body Weight:  83.6 kg  BMI:  Body mass index is 24.62 kg/m.  Estimated Nutritional Needs:   Kcal:  2100-2400  Protein:  95-110 grams  Fluid:  >2L    Herma Carson, [place] Dietetic Intern

## 2018-08-18 NOTE — Care Management Obs Status (Signed)
[facility] NOTIFICATION   Patient Details  Name: Andrew Bates MRN: [id number] Date of Birth: Jan 09, 1946   Medicare Observation Status Notification Given:  Yes    Carles Collet, RN 08/18/2018, 11:29 AM

## 2018-08-19 ENCOUNTER — Telehealth: Payer: Self-pay | Admitting: Cardiology

## 2018-08-19 NOTE — Telephone Encounter (Signed)
I instructed wife to call pcp now.She agrees

## 2018-08-19 NOTE — Consult Note (Signed)
            [facility] CM Primary Care Navigator  08/19/2018  Andrew Bates [birth year] [id number]   Attempt to see patientat the bedside to identify possible discharge needs buthewasalreadydischargedper staff.  Per MD note,patient presented with hypertension and chest pain, underwent cardiac catheterization. (unstable angina, vertigo)  Patient has discharge instruction to follow-up withcardiology on 09/02/18 and to follow-up with your primary care provider and neurologist for vertigo symptoms.  Primary care provider's office is listed as providing transition of care (TOC) follow-up.    For additional questions please contact:  Edwena Felty A. Majd Tissue, BSN, RN-BC [facility] PRIMARY CARE Navigator Cell: [phone]

## 2018-08-19 NOTE — Telephone Encounter (Signed)
Pt is running a fever of 100.6, white and pale, has no energy and is nauseous per message from pt's wife, [name].  [name] can be reached @ [phone]

## 2018-08-21 ENCOUNTER — Emergency Department (HOSPITAL_COMMUNITY): Payer: PPO

## 2018-08-21 ENCOUNTER — Emergency Department (HOSPITAL_COMMUNITY)
Admission: EM | Admit: 2018-08-21 | Discharge: 2018-08-21 | Disposition: A | Discharge status: Home / Self Care | Payer: PPO | Attending: Emergency Medicine | Admitting: Emergency Medicine

## 2018-08-21 ENCOUNTER — Encounter (HOSPITAL_COMMUNITY): Payer: Self-pay | Admitting: Emergency Medicine

## 2018-08-21 ENCOUNTER — Other Ambulatory Visit: Payer: Self-pay

## 2018-08-21 DIAGNOSIS — J111 Influenza due to unidentified influenza virus with other respiratory manifestations: Secondary | ICD-10-CM | POA: Diagnosis not present

## 2018-08-21 DIAGNOSIS — Z79899 Other long term (current) drug therapy: Secondary | ICD-10-CM | POA: Insufficient documentation

## 2018-08-21 DIAGNOSIS — I10 Essential (primary) hypertension: Secondary | ICD-10-CM | POA: Insufficient documentation

## 2018-08-21 DIAGNOSIS — R69 Illness, unspecified: Secondary | ICD-10-CM

## 2018-08-21 DIAGNOSIS — R509 Fever, unspecified: Secondary | ICD-10-CM | POA: Diagnosis not present

## 2018-08-21 DIAGNOSIS — R05 Cough: Secondary | ICD-10-CM | POA: Diagnosis not present

## 2018-08-21 DIAGNOSIS — J45909 Unspecified asthma, uncomplicated: Secondary | ICD-10-CM | POA: Diagnosis not present

## 2018-08-21 DIAGNOSIS — Z7982 Long term (current) use of aspirin: Secondary | ICD-10-CM | POA: Diagnosis not present

## 2018-08-21 DIAGNOSIS — R110 Nausea: Secondary | ICD-10-CM | POA: Diagnosis not present

## 2018-08-21 DIAGNOSIS — Z87891 Personal history of nicotine dependence: Secondary | ICD-10-CM | POA: Diagnosis not present

## 2018-08-21 LAB — CBC WITH DIFFERENTIAL/PLATELET
Abs Immature Granulocytes: 0.05 10*3/uL (ref 0.00–0.07)
Basophils Absolute: 0.1 10*3/uL (ref 0.0–0.1)
Basophils Relative: 1 %
EOS ABS: 0.3 10*3/uL (ref 0.0–0.5)
Eosinophils Relative: 3 %
HCT: 41.6 % (ref 39.0–52.0)
Hemoglobin: 14.1 g/dL (ref 13.0–17.0)
Immature Granulocytes: 1 %
Lymphocytes Relative: 14 %
Lymphs Abs: 1.4 10*3/uL (ref 0.7–4.0)
MCH: 33.2 pg (ref 26.0–34.0)
MCHC: 33.9 g/dL (ref 30.0–36.0)
MCV: 97.9 fL (ref 80.0–100.0)
Monocytes Absolute: 1.5 10*3/uL — ABNORMAL HIGH (ref 0.1–1.0)
Monocytes Relative: 14 %
Neutro Abs: 7.4 10*3/uL (ref 1.7–7.7)
Neutrophils Relative %: 67 %
Platelets: 309 10*3/uL (ref 150–400)
RBC: 4.25 MIL/uL (ref 4.22–5.81)
RDW: 12 % (ref 11.5–15.5)
WBC: 10.7 10*3/uL — ABNORMAL HIGH (ref 4.0–10.5)
nRBC: 0 % (ref 0.0–0.2)

## 2018-08-21 LAB — COMPREHENSIVE METABOLIC PANEL
ALT: 19 U/L (ref 0–44)
AST: 32 U/L (ref 15–41)
Albumin: 3.8 g/dL (ref 3.5–5.0)
Alkaline Phosphatase: 91 U/L (ref 38–126)
Anion gap: 9 (ref 5–15)
BUN: 17 mg/dL (ref 8–23)
CALCIUM: 8.5 mg/dL — AB (ref 8.9–10.3)
CO2: 25 mmol/L (ref 22–32)
Chloride: 106 mmol/L (ref 98–111)
Creatinine, Ser: 1.16 mg/dL (ref 0.61–1.24)
GFR calc Af Amer: >60 mL/min (ref >60)
GFR calc non Af Amer: >60 mL/min (ref >60)
Glucose, Bld: 96 mg/dL (ref 70–99)
Potassium: 4 mmol/L (ref 3.5–5.1)
Sodium: 140 mmol/L (ref 135–145)
Total Bilirubin: 0.7 mg/dL (ref 0.3–1.2)
Total Protein: 6.8 g/dL (ref 6.5–8.1)

## 2018-08-21 LAB — URINALYSIS, ROUTINE W REFLEX MICROSCOPIC
Bilirubin Urine: NEGATIVE
Glucose, UA: NEGATIVE mg/dL
HGB URINE DIPSTICK: NEGATIVE
Ketones, ur: NEGATIVE mg/dL
Leukocytes,Ua: NEGATIVE
Nitrite: NEGATIVE
Protein, ur: NEGATIVE mg/dL
Specific Gravity, Urine: 1.024 (ref 1.005–1.030)
pH: 8 (ref 5.0–8.0)

## 2018-08-21 LAB — INFLUENZA PANEL BY PCR (TYPE A & B)
Influenza A By PCR: NEGATIVE
Influenza B By PCR: NEGATIVE

## 2018-08-21 LAB — LACTIC ACID, PLASMA: LACTIC ACID, VENOUS: 1 mmol/L (ref 0.5–1.9)

## 2018-08-21 LAB — GROUP A STREP BY PCR: Group A Strep by PCR: NOT DETECTED

## 2018-08-21 MED ORDER — SODIUM CHLORIDE 0.9 % IV SOLN
INTRAVENOUS | Status: DC
  Administered 2018-08-21: 100 mL/h via INTRAVENOUS

## 2018-08-21 MED ORDER — ONDANSETRON HCL 4 MG/2ML IJ SOLN
4.0000 mg | Freq: Once | INTRAMUSCULAR | Status: AC
  Administered 2018-08-21: 4 mg via INTRAVENOUS
  Filled 2018-08-21: qty 2

## 2018-08-21 MED ORDER — ACETAMINOPHEN 500 MG PO TABS
1000.0000 mg | ORAL_TABLET | Freq: Once | ORAL | Status: AC
  Administered 2018-08-21: 1000 mg via ORAL
  Filled 2018-08-21: qty 2

## 2018-08-21 MED ORDER — SODIUM CHLORIDE 0.9 % IV BOLUS
1000.0000 mL | Freq: Once | INTRAVENOUS | Status: AC
  Administered 2018-08-21: 1000 mL via INTRAVENOUS

## 2018-08-21 MED ORDER — LORAZEPAM 2 MG/ML IJ SOLN
1.0000 mg | Freq: Once | INTRAMUSCULAR | Status: AC
  Administered 2018-08-21: 1 mg via INTRAVENOUS
  Filled 2018-08-21: qty 1

## 2018-08-21 MED ORDER — DM-GUAIFENESIN ER 30-600 MG PO TB12: 1.0000 | ORAL_TABLET | Freq: Two times a day (BID) | ORAL | 1 refills | Status: DC

## 2018-08-21 NOTE — ED Provider Notes (Signed)
[facility] EMERGENCY DEPARTMENT Provider Note   CSN: [id number] Arrival date & time: 08/21/18  1047    History   Chief Complaint Chief Complaint  Patient presents with  . Fever    HPI Andrew Bates is a 73 y.o. male.     Patient presenting with 3-day history of flulike illness.  Onset of fever yesterday.  Worsening cough some nausea chills sore throat.  Patient's been struggling with some congestion for a period of time.  Patient also has longstanding dizziness and vertigo has undergone extensive work-up without any specific findings.  Patient is to be taking Antivert for this.  Still followed by ear nose and throat.  Despite the nausea no vomiting no diarrhea.  Patient did have the flu shot this year.  Oxygen saturation 97% heart rate was 66 blood pressure was 125/84 initial presentation not consistent with septic criteria.     Past Medical History:  Diagnosis Date  . Adenomatous colon polyp   . Anxiety   . Arthritis   . Asthma   . Complication of anesthesia    slow to come back awake- per patient/ and makes him sick per wife  . DVT (deep venous thrombosis) ([place]) 2008  . Essential hypertension   . GERD (gastroesophageal reflux disease)   . History of kidney stones   . Hyperlipidemia   . Internal hemorrhoids   . Peripheral neuropathy   . Post traumatic stress disorder (PTSD)   . Sleep apnea   . Vertigo     Patient Active Problem List   Diagnosis Date Noted  . Unstable angina ([place]) 08/17/2018  . Precordial pain   . Cervical spondylosis with radiculopathy 07/09/2015  . Urinary frequency 02/25/2015  . Dehydration 02/25/2015  . Severe vertigo 02/25/2015  . Hydronephrosis 02/15/2014  . Acute renal failure ([place]) 02/15/2014  . Calculus of ureter 02/14/2014  . Urinary hesitancy 11/17/2013  . Overweight(278.02) 11/17/2013  . Acute pyelonephritis 11/16/2013  . Kidney stones 11/16/2013  . Acute onset of severe vertigo 11/16/2013  . Sepsis secondary to UTI ([place])  11/16/2013  . Hypertension 11/16/2013  . Hyperlipemia 11/16/2013  . GERD (gastroesophageal reflux disease) 11/16/2013    Past Surgical History:  Procedure Laterality Date  . ANTERIOR CERVICAL DECOMP/DISCECTOMY FUSION N/A 07/09/2015   Procedure: ANTERIOR CERVICAL DECOMPRESSION/DISCECTOMY FUSION CERVICAL FIVE-SIX,CERVICAL SIX-SEVEN;  Surgeon: Newman Pies, MD;  Location: [facility];  Service: Neurosurgery;  Laterality: N/A;  . CARPAL TUNNEL RELEASE Bilateral 2014  . COLONOSCOPY    . CYSTOSCOPY/RETROGRADE/URETEROSCOPY/STONE EXTRACTION WITH BASKET Left 02/14/2014   Procedure: CYSTOSCOPY AND LEFT DOUBLE J STENT PLACEMENT;  ATTEMPTED STONE  EXTRACTION ;  Surgeon: Jorja Loa, MD;  Location: [facility];  Service: Urology;  Laterality: Left;  . ESOPHAGOGASTRODUODENOSCOPY ENDOSCOPY    . KNEE SURGERY Right 2006   Arthroscopy - MCl tear  . LEFT HEART CATH AND CORONARY ANGIOGRAPHY N/A 08/17/2018   Procedure: LEFT HEART CATH AND CORONARY ANGIOGRAPHY;  Surgeon: Burnell Blanks, MD;  Location: [facility];  Service: Cardiovascular;  Laterality: N/A;        Home Medications    Prior to Admission medications   Medication Sig Start Date End Date Taking? Authorizing Provider  acetaminophen (TYLENOL) 500 MG tablet Take 500 mg by mouth every 6 (six) hours as needed for fever.   Yes [provider]  Artificial Tear Ointment (DRY EYES OP) Apply 1 application to eye as needed (dry eyes).    Yes [provider]  aspirin EC  81 MG EC tablet Take 1 tablet (81 mg total) by mouth daily. 08/19/18  Yes Kathyrn Drown D, NP  Biotin (BIOTIN 5000) 5 MG CAPS Take 10 mg by mouth daily.    Yes [provider]  buPROPion (WELLBUTRIN XL) 150 MG 24 hr tablet Take 150 mg by mouth daily.   Yes [provider]  busPIRone (BUSPAR) 10 MG tablet Take 10 mg by mouth 2 (two) times daily.   Yes [provider]  diazepam (VALIUM) 5 MG tablet Take 1 every 4-6 hours as  needed for dizziness Patient taking differently: Take 5 mg by mouth every 6 (six) hours as needed (for dizziness).  07/31/18  Yes Milton Ferguson, MD  diltiazem (CARDIZEM CD) 180 MG 24 hr capsule Take 1 capsule by mouth at bedtime. 07/21/18  Yes [provider]  L-Arginine 500 MG CAPS Take 1 capsule by mouth daily.   Yes [provider]  meclizine (ANTIVERT) 25 MG tablet Take 1-2 tablets (25-50 mg total) by mouth 3 (three) times daily as needed for up to 14 days for dizziness. 08/18/18 09/01/18 Yes Kathyrn Drown D, NP  metoprolol tartrate (LOPRESSOR) 25 MG tablet Take 1 tablet (25 mg total) by mouth 2 (two) times daily. 08/18/18  Yes Kathyrn Drown D, NP  mirtazapine (REMERON) 45 MG tablet Take 45 mg by mouth at bedtime.   Yes [provider]  omeprazole (PRILOSEC) 20 MG capsule Take 20 mg by mouth every morning.    Yes [provider]  ondansetron (ZOFRAN ODT) 4 MG disintegrating tablet 4mg  ODT q4 hours prn nausea/vomit Patient taking differently: Take 4 mg by mouth every 4 (four) hours as needed for nausea or vomiting.  07/31/18  Yes Milton Ferguson, MD  promethazine (PHENERGAN) 25 MG tablet Take 25 mg by mouth every 6 (six) hours as needed for nausea or vomiting.   Yes [provider]  psyllium (METAMUCIL) 58.6 % powder Take by mouth. 2 teaspoons daily   Yes [provider]  rosuvastatin (CRESTOR) 20 MG tablet Take 1 tablet (20 mg total) by mouth daily at 6 PM. 08/18/18  Yes Kathyrn Drown D, NP  dextromethorphan-guaiFENesin [facility] DM) 30-600 MG 12hr tablet Take 1 tablet by mouth 2 (two) times daily. 08/21/18   Fredia Sorrow, MD  nitroGLYCERIN (NITROSTAT) 0.4 MG SL tablet Place 1 tablet (0.4 mg total) under the tongue every 5 (five) minutes x 3 doses as needed for chest pain. 08/18/18   Tommie Raymond, NP    Family History Family History  Problem Relation Age of Onset  . Hypertension Father   . Hyperlipidemia Father   . Lung cancer Father         from piece of abestos lodged in his lung  . Thyroid cancer Mother   . Colon cancer Brother 34  . Bladder Cancer Brother     Social History Social History   Tobacco Use  . Smoking status: Former Smoker    Types: Cigars  . Smokeless tobacco: Never Used  . Tobacco comment: quit 15 years ago (2017) never smoked alot  Substance Use Topics  . Alcohol use: No  . Drug use: No     Allergies   Other; Tamsulosin; Codeine; Morphine and related; Pentazocine; Sertraline; and Topiramate   Review of Systems Review of Systems  Constitutional: Positive for chills and fever.  HENT: Positive for congestion, ear pain and sore throat. Negative for rhinorrhea.   Eyes: Positive for visual disturbance. Negative for pain and redness.  Respiratory: Positive for cough. Negative for shortness of breath.   Cardiovascular: Negative for chest pain, palpitations and leg swelling.  Gastrointestinal: Positive for nausea. Negative for abdominal pain, diarrhea and vomiting.  Genitourinary: Negative for dysuria and hematuria.  Musculoskeletal: Positive for myalgias. Negative for arthralgias, back pain and neck pain.  Skin: Negative for color change and rash.  Neurological: Positive for dizziness. Negative for seizures, syncope, light-headedness and headaches.  Hematological: Does not bruise/bleed easily.  Psychiatric/Behavioral: Negative for confusion.  All other systems reviewed and are negative.    Physical Exam Updated Vital Signs BP (!) 112/100   Pulse 64   Temp 98.9 F (37.2 C) (Oral)   Resp 20   Ht 1.854 m (6\' 1" )   Wt 84.4 kg   SpO2 97%   BMI 24.54 kg/m   Physical Exam Vitals signs and nursing note reviewed.  Constitutional:      General: He is not in acute distress.    Appearance: Normal appearance. He is well-developed.  HENT:     Head: Normocephalic and atraumatic.     Right Ear: Tympanic membrane normal.     Left Ear: Tympanic membrane normal.     Nose: Congestion present.      Mouth/Throat:     Mouth: Mucous membranes are dry.     Pharynx: Posterior oropharyngeal erythema present. No oropharyngeal exudate.     Comments: White coating on the tongue.  Mucous membranes dry. Eyes:     Extraocular Movements: Extraocular movements intact.     Conjunctiva/sclera: Conjunctivae normal.     Pupils: Pupils are equal, round, and reactive to light.  Neck:     Musculoskeletal: Neck supple.  Cardiovascular:     Rate and Rhythm: Normal rate and regular rhythm.     Heart sounds: No murmur.  Pulmonary:     Effort: Pulmonary effort is normal. No respiratory distress.     Breath sounds: Normal breath sounds.  Abdominal:     Palpations: Abdomen is soft.     Tenderness: There is no abdominal tenderness.  Musculoskeletal: Normal range of motion.        General: No swelling.  Skin:    General: Skin is warm and dry.     Capillary Refill: Capillary refill takes less than 2 seconds.  Neurological:     Mental Status: He is alert and oriented to person, place, and time.     Sensory: No sensory deficit.     Motor: No weakness.     Comments: Still having bouts of the dizziness and vertigo which was pre-existing the acute illness.      ED Treatments / Results  Labs (all labs ordered are listed, but only abnormal results are displayed) Labs Reviewed  COMPREHENSIVE METABOLIC PANEL - Abnormal; Notable for the following components:      Result Value   Calcium 8.5 (*)    All other components within normal limits  CBC WITH DIFFERENTIAL/PLATELET - Abnormal; Notable for the following components:   WBC 10.7 (*)    Monocytes Absolute 1.5 (*)    All other components within normal limits  GROUP A STREP BY PCR  INFLUENZA PANEL BY PCR (TYPE A & B)  URINALYSIS, ROUTINE W REFLEX MICROSCOPIC  LACTIC ACID, PLASMA    EKG None  Radiology Dg Chest 2 View  Result Date: 08/21/2018 CLINICAL DATA:  Cough and fevers EXAM: CHEST - 2 VIEW COMPARISON:  08/17/2018 FINDINGS: The heart size and  mediastinal contours are within normal limits. Both lungs  are clear. The visualized skeletal structures are unremarkable. Postsurgical changes are noted in the cervical spine. IMPRESSION: No active cardiopulmonary disease. Electronically Signed   By: Inez Catalina M.D.   On: 08/21/2018 13:35    Procedures Procedures (including critical care time)  Medications Ordered in ED Medications  0.9 %  sodium chloride infusion (100 mL/hr Intravenous New Bag/Given 08/21/18 1443)  acetaminophen (TYLENOL) tablet 1,000 mg (1,000 mg Oral Given 08/21/18 1139)  sodium chloride 0.9 % bolus 1,000 mL (1,000 mLs Intravenous New Bag/Given 08/21/18 1326)  ondansetron (ZOFRAN) injection 4 mg (4 mg Intravenous Given 08/21/18 1327)  LORazepam (ATIVAN) injection 1 mg (1 mg Intravenous Given 08/21/18 1601)     Initial Impression / Assessment and Plan / ED Course  I have reviewed the triage vital signs and the nursing notes.  Pertinent labs & imaging results that were available during my care of the patient were reviewed by me and considered in my medical decision making (see chart for details).       Patient's work-up for flulike illness negative for influenza A or B.  Chest x-ray negative for pneumonia.  Rapid strep also negative.  Patient's dizziness vertigo improved here with some IV Ativan.  Patient does have meclizine to take at home.  Patient given IV fluids received total of 2 L feels much better.  Will be treated symptomatically with Mucinex DM and will continue Tylenol for the fever.  Patient's fever here improved with Tylenol.  Patient will return for any new or worse symptoms.  Since influenza was negative patient will not be started on Tamiflu.  Patient's had no recent travel.   Final Clinical Impressions(s) / ED Diagnoses   Final diagnoses:  Influenza-like illness    ED Discharge Orders         Ordered    dextromethorphan-guaiFENesin [facility] DM) 30-600 MG 12hr tablet  2 times daily     08/21/18 1726             Fredia Sorrow, MD 08/21/18 1856

## 2018-08-21 NOTE — Discharge Instructions (Addendum)
Today's work-up without any evidence of pneumonia or strep pharyngitis.  Influenza formal testing was negative for influenza a and B.  But your symptoms are flulike in nature.  Take the Mucinex DM for the cough.  Recommend taking Tylenol for the fever every 6 hours.  Return for any new or worse symptoms.  Follow-up with your regular doctors.

## 2018-08-21 NOTE — ED Triage Notes (Signed)
Patient c/o fever of "106." Per wife patient started running a fever of 103 yesterday and spiked to 106 today. Denies taking any medication. Patient reports productive cough with thick beige/grey colored sputum. Patient states had cath without stent on Tuesday due to left sided chest pressure, fatigue, visual disturbances, and dizziness x3 weeks. Per patient has been seen here in ER x2 for same symptoms but no answers. Patient states the symptoms increase with fever.

## 2018-08-26 DIAGNOSIS — R42 Dizziness and giddiness: Secondary | ICD-10-CM | POA: Diagnosis not present

## 2018-08-26 DIAGNOSIS — Z6824 Body mass index (BMI) 24.0-24.9, adult: Secondary | ICD-10-CM | POA: Diagnosis not present

## 2018-08-26 DIAGNOSIS — Z1389 Encounter for screening for other disorder: Secondary | ICD-10-CM | POA: Diagnosis not present

## 2018-08-26 DIAGNOSIS — B379 Candidiasis, unspecified: Secondary | ICD-10-CM | POA: Diagnosis not present

## 2018-08-26 DIAGNOSIS — F4312 Post-traumatic stress disorder, chronic: Secondary | ICD-10-CM | POA: Diagnosis not present

## 2018-09-02 ENCOUNTER — Ambulatory Visit: Payer: PPO | Admitting: Cardiology

## 2018-09-02 ENCOUNTER — Encounter: Payer: Self-pay | Admitting: Cardiology

## 2018-09-02 ENCOUNTER — Other Ambulatory Visit: Payer: Self-pay

## 2018-09-02 VITALS — BP 118/64 | HR 54 | Ht 73.0 in | Wt 184.0 lb

## 2018-09-02 DIAGNOSIS — I25119 Atherosclerotic heart disease of native coronary artery with unspecified angina pectoris: Secondary | ICD-10-CM | POA: Diagnosis not present

## 2018-09-02 DIAGNOSIS — Z8639 Personal history of other endocrine, nutritional and metabolic disease: Secondary | ICD-10-CM

## 2018-09-02 DIAGNOSIS — R42 Dizziness and giddiness: Secondary | ICD-10-CM

## 2018-09-02 NOTE — Progress Notes (Signed)
Cardiology Office Note  Date: 09/02/2018   ID: Andrew Bates, Andrew Bates [birth year], MRN [id number]  PCP: Sharilyn Sites, MD  Primary Cardiologist: Rozann Lesches, MD   Chief Complaint  Patient presents with   Follow-up cardiac catheterization    History of Present Illness: Andrew Bates is a 73 y.o. male last seen in February.  He was referred at that time for ischemic testing for evaluation of intermittent left-sided chest and neck discomfort.  Lexiscan Myoview on February 19 indicated area of inferior/inferoseptal scar with suspected moderate peri-infarct ischemia and LVEF 49%.  He was informed of the results on February 20 with follow-up made to discuss possible diagnostic cardiac catheterization.  In the interim however he presented to the ER after undergoing a test for investigation of severe vertigo at which point his blood pressure was significantly elevated.  He was evaluated by the cardiology service, ruled out for ACS.  He subsequently underwent a diagnostic cardiac catheterization with Dr. Angelena Form which demonstrated only mild to moderate atherosclerotic plaque, 40 to 50% smooth lesion in the ostial LAD, no obvious culprit lesion to explain his symptoms or the abnormal Myoview results.  LVEF was graded at 40 to 50% with apical hypokinesis during angiography, however a follow-up echocardiogram the next day showed normal LVEF at 60 to 65%.  He was discharged with plan for medical therapy.  He is here today with his wife for follow-up.  We discussed the results of his cardiac testing in detail.  We went over plan for medical therapy which at this point includes aspirin and the recent addition of Crestor.  Continues to struggle with recurring vertigo, also has anxiety and depression as well as PTSD.  He sees several different providers including the [facility] system, and frankly has been frustrated, recently cut back several of his medications.  He tells me that he is focused on trying  to get better.  Past Medical History:  Diagnosis Date   Adenomatous colon polyp    Anxiety    Arthritis    Asthma    Complication of anesthesia    slow to come back awake- per patient/ and makes him sick per wife   DVT (deep venous thrombosis) ([place]) 2008   Essential hypertension    GERD (gastroesophageal reflux disease)    History of kidney stones    Hyperlipidemia    Internal hemorrhoids    Peripheral neuropathy    Post traumatic stress disorder (PTSD)    Sleep apnea    Vertigo     Past Surgical History:  Procedure Laterality Date   ANTERIOR CERVICAL DECOMP/DISCECTOMY FUSION N/A 07/09/2015   Procedure: ANTERIOR CERVICAL DECOMPRESSION/DISCECTOMY FUSION CERVICAL FIVE-SIX,CERVICAL SIX-SEVEN;  Surgeon: Newman Pies, MD;  Location: [facility];  Service: Neurosurgery;  Laterality: N/A;   CARPAL TUNNEL RELEASE Bilateral 2014   COLONOSCOPY     CYSTOSCOPY/RETROGRADE/URETEROSCOPY/STONE EXTRACTION WITH BASKET Left 02/14/2014   Procedure: CYSTOSCOPY AND LEFT DOUBLE J STENT PLACEMENT;  ATTEMPTED STONE  EXTRACTION ;  Surgeon: Jorja Loa, MD;  Location: [facility];  Service: Urology;  Laterality: Left;   ESOPHAGOGASTRODUODENOSCOPY ENDOSCOPY     KNEE SURGERY Right 2006   Arthroscopy - MCl tear   LEFT HEART CATH AND CORONARY ANGIOGRAPHY N/A 08/17/2018   Procedure: LEFT HEART CATH AND CORONARY ANGIOGRAPHY;  Surgeon: Burnell Blanks, MD;  Location: [place] [facility];  Service: Cardiovascular;  Laterality: N/A;    Current Outpatient Medications  Medication Sig Dispense Refill   acetaminophen (TYLENOL) 500  MG tablet Take 500 mg by mouth every 6 (six) hours as needed for fever.     Artificial Tear Ointment (DRY EYES OP) Apply 1 application to eye as needed (dry eyes).      aspirin EC 81 MG EC tablet Take 1 tablet (81 mg total) by mouth daily.     Biotin (BIOTIN 5000) 5 MG CAPS Take 10 mg by mouth daily.      diltiazem (CARDIZEM CD) 180 MG 24 hr capsule  Take 1 capsule by mouth at bedtime.     metoprolol tartrate (LOPRESSOR) 25 MG tablet Take 1 tablet (25 mg total) by mouth 2 (two) times daily. 180 tablet 4   nitroGLYCERIN (NITROSTAT) 0.4 MG SL tablet Place 1 tablet (0.4 mg total) under the tongue every 5 (five) minutes x 3 doses as needed for chest pain. 25 tablet 0   omeprazole (PRILOSEC) 20 MG capsule Take 20 mg by mouth every morning.      ondansetron (ZOFRAN ODT) 4 MG disintegrating tablet 4mg  ODT q4 hours prn nausea/vomit (Patient taking differently: Take 4 mg by mouth every 4 (four) hours as needed for nausea or vomiting. ) 20 tablet 0   promethazine (PHENERGAN) 25 MG tablet Take 25 mg by mouth every 6 (six) hours as needed for nausea or vomiting.     psyllium (METAMUCIL) 58.6 % powder Take by mouth. 2 teaspoons daily     rosuvastatin (CRESTOR) 20 MG tablet Take 1 tablet (20 mg total) by mouth daily at 6 PM. 90 tablet 4   dextromethorphan-guaiFENesin (MUCINEX DM) 30-600 MG 12hr tablet Take 1 tablet by mouth 2 (two) times daily. (Patient not taking: Reported on 09/02/2018) 14 tablet 1   diazepam (VALIUM) 5 MG tablet Take 1 every 4-6 hours as needed for dizziness (Patient not taking: Reported on 09/02/2018) 15 tablet 0   mirtazapine (REMERON) 45 MG tablet Take 45 mg by mouth at bedtime.     No current facility-administered medications for this visit.    Allergies:  Other; Tamsulosin; Codeine; Morphine and related; Pentazocine; Sertraline; and Topiramate   Social History: The patient  reports that he has quit smoking. His smoking use included cigars. He has never used smokeless tobacco. He reports that he does not drink alcohol or use drugs.   ROS:  Please see the history of present illness. Otherwise, complete review of systems is positive for none.  All other systems are reviewed and negative.   Physical Exam: VS:  BP 118/64 (BP Location: Left Arm)    Pulse (!) 54    Ht 6\' 1"  (1.854 m)    Wt 184 lb (83.5 kg)    SpO2 96%    BMI  24.28 kg/m , BMI Body mass index is 24.28 kg/m.  Wt Readings from Last 3 Encounters:  09/02/18 184 lb (83.5 kg)  08/21/18 186 lb (84.4 kg)  08/18/18 186 lb 9.6 oz (84.6 kg)    General: Patient appears comfortable at rest. HEENT: Conjunctiva and lids normal, oropharynx clear. Neck: Supple, no elevated JVP or carotid bruits, no thyromegaly. Lungs: Clear to auscultation, nonlabored breathing at rest. Cardiac: Regular rate and rhythm, no S3 or significant systolic murmur. Abdomen: Soft, nontender, bowel sounds present. Extremities: No pitting edema, distal pulses 2+. Skin: Warm and dry. Musculoskeletal: No kyphosis. Neuropsychiatric: Alert and oriented x3, affect grossly appropriate.  ECG: I personally reviewed the tracing from 08/17/2018 which showed normal sinus rhythm.  Recent Labwork: 08/21/2018: ALT 19; AST 32; BUN 17; Creatinine, Ser 1.16; Hemoglobin  14.1; Platelets 309; Potassium 4.0; Sodium 140   Other Studies Reviewed Today:  Lexiscan Myoview 08/11/2018:  There was no ST segment deviation noted during stress.  Findings consistent with large prior inferior/inferoseptal myocardial infarction with moderate peri-infarct ischemia.  This is an intermediate risk study.  The left ventricular ejection fraction is mildly decreased (49%).  Cardiac catheterization 08/17/2018:  Ost Cx to Prox Cx lesion is 20% stenosed.  Dist LM to Ost LAD lesion is 50% stenosed.  Prox LAD lesion is 30% stenosed.  Prox LAD to Mid LAD lesion is 20% stenosed.  The left ventricular systolic function is normal.  LV end diastolic pressure is normal.  The left ventricular ejection fraction is 45-50% by visual estimate.  There is no mitral valve regurgitation.   1. Moderate (40-50%) ostial LAD stenosis. This is a smooth lesion and does not appear to be flow limiting.  2. Mild non-obstructive disease in the proximal Circumflex artery 3. Large dominant RCA with no obvious plaque.  4. Mild  segmental LV systolic dysfunction, JSRP=59-45%. Apical hypokinesis.   Echocardiogram 08/18/2018:  1. The left ventricle has normal systolic function with an ejection fraction of 60-65%. The cavity size was mildly dilated. Left ventricular diastolic parameters were normal.  2. The mitral valve is normal in structure.  3. The tricuspid valve is normal in structure.  4. The aortic valve is tricuspid Mild calcification of the aortic valve. Aortic valve regurgitation is mild by color flow Doppler.  5. The pulmonic valve was normal in structure.  6. There is mild dilatation of the aortic root measuring 40 mm.  Assessment and Plan:  1.  Mild to moderate nonobstructive CAD documented by recent cardiac catheterization.  No evidence of ACS during recent hospital stay.  LVEF normal at 60 to 65% by echocardiogram.  It is unlikely that his coronary atherosclerosis is leading to active angina symptoms.  Plan is to manage medically, at this point he is on aspirin, Lopressor, and Crestor.  2.  History of hyperlipidemia.  Recently placed back on Crestor.  Follow-up FLP and LFTs in the next 8 to 12 weeks.  3.  Severe recurrent vertigo.  Keep follow-up with ENT.  His PCP is Dr. Hilma Favors.  Current medicines were reviewed with the patient today.   Orders Placed This Encounter  Procedures   Lipid Profile   Hepatic function panel    Disposition: Follow-up in 6 months.  Signed, Satira Sark, MD, [facility] 09/02/2018 2:41 PM    [facility] [address], [place], [place] [address] Phone: [phone]; Fax: [phone]

## 2018-09-02 NOTE — Patient Instructions (Signed)
Medication Instructions:  Your physician recommends that you continue on your current medications as directed. Please refer to the Current Medication list given to you today.  If you need a refill on your cardiac medications before your next appointment, please call your pharmacy.   Lab work: Fasting Lipids, LFT's in 8-12 weeks   If you have labs (blood work) drawn today and your tests are completely normal, you will receive your results only by: Marland Kitchen MyChart Message (if you have MyChart) OR . A paper copy in the mail If you have any lab test that is abnormal or we need to change your treatment, we will call you to review the results.  Testing/Procedures: None today  Follow-Up: At [facility], you and your health needs are our priority.  As part of our continuing mission to provide you with exceptional heart care, we have created designated Provider Care Teams.  These Care Teams include your primary Cardiologist (physician) and Advanced Practice Providers (APPs -  Physician Assistants and Nurse Practitioners) who all work together to provide you with the care you need, when you need it. You will need a follow up appointment in 6 months.  Please call our office 2 months in advance to schedule this appointment.  You may see Rozann Lesches, MD or one of the following Advanced Practice Providers on your designated Care Team:   Bernerd Pho, PA-C [facility]) . Ermalinda Barrios, PA-C ([place])  Any Other Special Instructions Will Be Listed Below (If Applicable). None

## 2018-10-25 DIAGNOSIS — L820 Inflamed seborrheic keratosis: Secondary | ICD-10-CM | POA: Diagnosis not present

## 2018-10-27 ENCOUNTER — Telehealth: Payer: Self-pay | Admitting: Cardiology

## 2018-10-27 NOTE — Telephone Encounter (Signed)
Pt is having a reaction to his medications and he's not sure which one it is coming from, would like to speak with the nurse.   Please give pt a call @ [phone]

## 2018-10-27 NOTE — Telephone Encounter (Signed)
Returned call to patient that states that he has been having a reaction to his medication. Pt states that he is unsure what medication at this time. Pt reports starting Crestor and Lopressor at the same time. Pt was also seen by PCP during this time and given Cardizem. Pt has called PCP office with same concern. At that time Cardizem was stopped, and pt was told not to take any statins . Pt c/o blurred vision, rash, itchy skin, gas and redness to sun exposed areas. Pt reports that sx started 2 weeks after starting all medications. Pt states he read side effects of all medications and all are similar. Please advise.

## 2018-10-27 NOTE — Telephone Encounter (Signed)
Pt notified and voiced understanding 

## 2018-10-27 NOTE — Telephone Encounter (Signed)
At my last visit with him he was on aspirin, Lopressor, Cardizem CD, and Crestor.  Since Cardizem CD was stopped by PCP and he continues to have symptoms, would hold Crestor next.

## 2018-11-22 ENCOUNTER — Telehealth: Payer: Self-pay | Admitting: Cardiology

## 2018-11-22 NOTE — Telephone Encounter (Signed)
Stopped metoprolol, itching stopped, went back on it, itching returned.   Never had labs in March, will mail lab slip to pt

## 2018-11-22 NOTE — Telephone Encounter (Signed)
Pt will stop metoprolol and follow BP and HR at home for now.

## 2018-11-22 NOTE — Telephone Encounter (Signed)
Patient called stating that he thinks he is allergic to the metoprolol tartrate . Patient is itching, stinging and burning.  Patient needs to know if he needs to have labs before his visit in September. [phone]) [name]

## 2018-11-22 NOTE — Telephone Encounter (Signed)
Would stay off metoprolol.  He has had other medication adjustments to based on chart review.  Would track heart rate and blood pressure to see if he needs any additional therapies need to be considered.

## 2018-12-07 ENCOUNTER — Encounter: Payer: Self-pay | Admitting: *Deleted

## 2018-12-07 ENCOUNTER — Telehealth: Payer: Self-pay | Admitting: *Deleted

## 2018-12-07 NOTE — Telephone Encounter (Signed)
Spoke with pt who states that he is dizzy and lightheaded. Pt states that he is calling to update office on BP and HR after stopping Metoprolol.  6/15  176/99  61 6/14  168/89  67 6/13  130/84  85 6/12  153/87  66 Please advise.

## 2018-12-08 NOTE — Telephone Encounter (Signed)
He has had a number of medication changes in the last few weeks related to itching which ultimately turned out to be the metoprolol based on review of the chart.  He had previously been on Cardizem CD, I wonder if he should start back on that at this point.

## 2018-12-09 NOTE — Telephone Encounter (Signed)
Patient notified

## 2018-12-13 ENCOUNTER — Telehealth: Payer: Self-pay | Admitting: Cardiology

## 2018-12-13 DIAGNOSIS — Z79899 Other long term (current) drug therapy: Secondary | ICD-10-CM

## 2018-12-13 MED ORDER — LOSARTAN POTASSIUM 25 MG PO TABS: 25.0000 mg | ORAL_TABLET | Freq: Every day | ORAL | 3 refills | Status: DC

## 2018-12-13 NOTE — Telephone Encounter (Signed)
Patient is requesting to speak with nurse due to BP getting worse. / tg

## 2018-12-13 NOTE — Telephone Encounter (Signed)
Patient confirms he is not on any cardizem.I e-scribed losartan to [facility] and mailed lab slip

## 2018-12-13 NOTE — Telephone Encounter (Signed)
    Covering for Dr. Domenic Polite. In reviewing his readings, I am in agreement he needs something for BP control. Looks like he was on Lopressor in the past but intolerant to it. Please confirm if he is still taking Cardizem CD or not for this was listed in his last office note?  Can restart Losartan 25mg  daily but he will need a repeat BMET in 2 weeks to recheck K+ level and renal function.   Signed, Erma Heritage, PA-C 12/13/2018, 11:40 AM Pager: [phone]

## 2018-12-13 NOTE — Telephone Encounter (Signed)
Recent BP's : 183/113, 164/106, 145/105, 164/101, 159/95, 155/93   Patient states he was on Losartan 25 mg from the [place] in the past and would not be apposed to taking again. Even though listed on his meds, he states he not on rosuvastatin now

## 2018-12-27 ENCOUNTER — Other Ambulatory Visit (HOSPITAL_COMMUNITY)
Admission: RE | Admit: 2018-12-27 | Discharge: 2018-12-27 | Disposition: A | Discharge status: Home / Self Care | Payer: PPO | Source: Ambulatory Visit | Attending: Student | Admitting: Student

## 2018-12-27 ENCOUNTER — Telehealth: Payer: Self-pay | Admitting: Cardiology

## 2018-12-27 ENCOUNTER — Other Ambulatory Visit: Payer: Self-pay

## 2018-12-27 DIAGNOSIS — Z79899 Other long term (current) drug therapy: Secondary | ICD-10-CM | POA: Insufficient documentation

## 2018-12-27 LAB — BASIC METABOLIC PANEL
Anion gap: 9 (ref 5–15)
BUN: 17 mg/dL (ref 8–23)
CO2: 24 mmol/L (ref 22–32)
Calcium: 8.8 mg/dL — ABNORMAL LOW (ref 8.9–10.3)
Chloride: 105 mmol/L (ref 98–111)
Creatinine, Ser: 1.19 mg/dL (ref 0.61–1.24)
GFR calc Af Amer: >60 mL/min (ref >60)
GFR calc non Af Amer: >60 mL/min (ref >60)
Glucose, Bld: 105 mg/dL — ABNORMAL HIGH (ref 70–99)
Potassium: 4.1 mmol/L (ref 3.5–5.1)
Sodium: 138 mmol/L (ref 135–145)

## 2018-12-27 MED ORDER — LOSARTAN POTASSIUM 25 MG PO TABS: 50.0000 mg | ORAL_TABLET | Freq: Every day | ORAL | 3 refills | Status: DC

## 2018-12-27 NOTE — Telephone Encounter (Signed)
Pt will increase losartan to 50 mg daily and call back later this week with BP readings

## 2018-12-27 NOTE — Telephone Encounter (Signed)
I will forward to Dr McDowell for advice. 

## 2018-12-27 NOTE — Telephone Encounter (Signed)
Per phone call from pt's wife-- Pt has been on his BP meds for 2 weeks and his BP is still running high 180/93-- it has been running like that or higher since starting the meds  [phone]

## 2018-12-27 NOTE — Telephone Encounter (Signed)
Patient started back on losartan 25 mg daily, I reviewed the last telephone note by Ms. Strader PA-C.  He also had lab work done today which shows normal potassium and stable renal function.  Recommend increasing losartan to 50 mg daily.  Continue to track blood pressure and follow-up with PCP.

## 2018-12-31 ENCOUNTER — Telehealth: Payer: Self-pay | Admitting: Cardiology

## 2018-12-31 NOTE — Telephone Encounter (Signed)
Patient's wife calling to report that patient's new BP medication is not working. / tg

## 2018-12-31 NOTE — Telephone Encounter (Signed)
Spoke with pt. Informed him of recommendations. He voiced understanding of plan. He will call next Friday with updated blood pressures.

## 2018-12-31 NOTE — Telephone Encounter (Signed)
I think he should make a visit to see his PCP to make sure that there is nothing else going on.  I would not necessarily expect him to be dizzy while being concurrently hypertensive.  It may be that the higher dose of losartan has not yet had a chance to take effect in terms of blood pressure control.  He may need a different medication or addition to his current treatment.

## 2018-12-31 NOTE — Telephone Encounter (Signed)
Returned pt. call. He states that since his Losartan was increased to 50 mg daily on 12/27/2018, he has been very dizzy and not feeling well. His blood pressure is also remaining high: 176/90. 170/94, 154/102, 165/99, 179/100. Please advise.

## 2019-01-10 ENCOUNTER — Ambulatory Visit (INDEPENDENT_AMBULATORY_CARE_PROVIDER_SITE_OTHER): Payer: PPO | Admitting: Physician Assistant

## 2019-01-10 ENCOUNTER — Other Ambulatory Visit: Payer: Self-pay

## 2019-01-10 ENCOUNTER — Encounter: Payer: Self-pay | Admitting: Physician Assistant

## 2019-01-10 VITALS — BP 160/98 | HR 66 | Temp 97.6°F | Ht 74.0 in | Wt 195.4 lb

## 2019-01-10 DIAGNOSIS — I2510 Atherosclerotic heart disease of native coronary artery without angina pectoris: Secondary | ICD-10-CM | POA: Diagnosis not present

## 2019-01-10 DIAGNOSIS — E782 Mixed hyperlipidemia: Secondary | ICD-10-CM

## 2019-01-10 DIAGNOSIS — I10 Essential (primary) hypertension: Secondary | ICD-10-CM | POA: Diagnosis not present

## 2019-01-10 DIAGNOSIS — R42 Dizziness and giddiness: Secondary | ICD-10-CM

## 2019-01-10 MED ORDER — LOSARTAN POTASSIUM 50 MG PO TABS: 50.0000 mg | ORAL_TABLET | Freq: Two times a day (BID) | ORAL | 3 refills | Status: DC

## 2019-01-10 NOTE — Telephone Encounter (Signed)
Pt called stating BP is still not coming down, please call [phone]

## 2019-01-10 NOTE — Telephone Encounter (Signed)
Pt reports since increasing losartan to 50 mg 2 weeks ago, his BP's ares still elevated.185/105 today, past readings 159/94, 155/92, 164/85

## 2019-01-10 NOTE — Telephone Encounter (Signed)
Please schedule visit with Andrew Bates.  Have him bring in home blood pressure monitor and also readings.  May need combination therapy for blood pressure.

## 2019-01-10 NOTE — Telephone Encounter (Signed)
Pt has apt today with M lenze PA-C

## 2019-01-10 NOTE — Patient Instructions (Signed)
Medication Instructions:  INCREASE Losartan to 50 mg TWICE a day  Labwork:  BMET in 2 weeks (August 3 rd)  Procedures/Testing: none  Follow-Up: 1 month Dr.McDowell  Any Additional Special Instructions Will Be Listed Below (If Applicable).   Follow 2 gram sodium diet we have given you  If you need a refill on your cardiac medications before your next appointment, please call your pharmacy.      Thank you for choosing [facility] !

## 2019-01-10 NOTE — Progress Notes (Signed)
Cardiology Office Note    Date:  01/10/2019   ID:  Andrew Bates, Andrew Bates Nov 14, 1945, MRN [id number]  PCP:  Andrew Sites, MD  Cardiologist: Andrew Lesches, MD EPS: None  Chief Complaint  Patient presents with   Dizziness    History of Present Illness:  Andrew Bates is a 73 y.o. male with history of left-sided chest and neck discomfort.  Lexiscan 08/11/18 inferior/inferoseptal scar with suspected moderate peri-infarct ischemia LVEF 49%.  He was supposed to follow-up to discuss possible diagnostic cath but ended up in the ER with severe vertigo and significant hypertension.  He underwent heart cath 08/17/2018 which demonstrated only mild to moderate atherosclerotic plaque 40 to 50% smooth lesion in the ostial LAD and no obvious culprit lesion to explain symptoms or abnormal Myoview results.  LVEF 40 to 50% with apical hypokinesis during angiography but follow-up echo normal LVEF 60 to 65%.  Patient last saw Dr. Domenic Bates 08/2018 which time he continued to struggle with vertigo anxiety depression and PTSD.  Plan was for medical management with aspirin Lopressor and Crestor.  Patient called in May complaining of multiple side effects to his medications and stopped Crestor and diltiazem.  His metoprolol was ultimately stopped due to itching as well.  Patient called in 12/13/2018 with very high blood pressure readings and losartan 50 mg daily was started.  Patient comes in today for follow-up.  His blood pressure readings have been running high. He complains of being week and dizzy with worsening vertigo.  He says he does not add salt but he eats out frequently.  Past Medical History:  Diagnosis Date   Adenomatous colon polyp    Anxiety    Arthritis    Asthma    Complication of anesthesia    slow to come back awake- per patient/ and makes him sick per wife   DVT (deep venous thrombosis) ([facility]) 2008   Essential hypertension    GERD (gastroesophageal reflux disease)    History of  kidney stones    Hyperlipidemia    Internal hemorrhoids    Peripheral neuropathy    Post traumatic stress disorder (PTSD)    Sleep apnea    Vertigo     Past Surgical History:  Procedure Laterality Date   ANTERIOR CERVICAL DECOMP/DISCECTOMY FUSION N/A 07/09/2015   Procedure: ANTERIOR CERVICAL DECOMPRESSION/DISCECTOMY FUSION CERVICAL FIVE-SIX,CERVICAL SIX-SEVEN;  Surgeon: Newman Pies, MD;  Location: [facility];  Service: Neurosurgery;  Laterality: N/A;   CARPAL TUNNEL RELEASE Bilateral 2014   COLONOSCOPY     CYSTOSCOPY/RETROGRADE/URETEROSCOPY/STONE EXTRACTION WITH BASKET Left 02/14/2014   Procedure: CYSTOSCOPY AND LEFT DOUBLE J STENT PLACEMENT;  ATTEMPTED STONE  EXTRACTION ;  Surgeon: Andrew Loa, MD;  Location: [facility];  Service: Urology;  Laterality: Left;   ESOPHAGOGASTRODUODENOSCOPY ENDOSCOPY     KNEE SURGERY Right 2006   Arthroscopy - MCl tear   LEFT HEART CATH AND CORONARY ANGIOGRAPHY N/A 08/17/2018   Procedure: LEFT HEART CATH AND CORONARY ANGIOGRAPHY;  Surgeon: Andrew Blanks, MD;  Location: [facility];  Service: Cardiovascular;  Laterality: N/A;    Current Medications: Current Meds  Medication Sig   acetaminophen (TYLENOL) 500 MG tablet Take 500 mg by mouth every 6 (six) hours as needed for fever.   Artificial Tear Ointment (DRY EYES OP) Apply 1 application to eye as needed (dry eyes).    aspirin EC 81 MG EC tablet Take 1 tablet (81 mg total) by mouth daily.   Biotin (BIOTIN 5000) 5 MG  CAPS Take 10 mg by mouth daily.    mirtazapine (REMERON) 45 MG tablet Take 45 mg by mouth at bedtime.   nitroGLYCERIN (NITROSTAT) 0.4 MG SL tablet Place 1 tablet (0.4 mg total) under the tongue every 5 (five) minutes x 3 doses as needed for chest pain.   omeprazole (PRILOSEC) 20 MG capsule Take 20 mg by mouth every morning.    ondansetron (ZOFRAN ODT) 4 MG disintegrating tablet '4mg'$  ODT q4 hours prn nausea/vomit (Patient taking differently: Take 4  mg by mouth every 4 (four) hours as needed for nausea or vomiting. )   promethazine (PHENERGAN) 25 MG tablet Take 25 mg by mouth every 6 (six) hours as needed for nausea or vomiting.   psyllium (METAMUCIL) 58.6 % powder Take by mouth. 2 teaspoons daily   rosuvastatin (CRESTOR) 20 MG tablet Take 1 tablet (20 mg total) by mouth daily at 6 PM.   [DISCONTINUED] losartan (COZAAR) 25 MG tablet Take 2 tablets (50 mg total) by mouth daily.     Allergies:   Other, Tamsulosin, Codeine, Morphine and related, Pentazocine, Sertraline, and Topiramate   Social History   Socioeconomic History   Marital status: Married    Spouse name: Andrew Bates   Number of children: 2   Years of education: Not on file   Highest education level: Not on file  Occupational History   Occupation: retired/diabled  Scientist, product/process development strain: Not on file   Food insecurity    Worry: Not on file    Inability: Not on file   Transportation needs    Medical: Not on file    Non-medical: Not on file  Tobacco Use   Smoking status: Former Smoker    Types: Cigars   Smokeless tobacco: Never Used   Tobacco comment: quit 15 years ago (2017) never smoked alot  Substance and Sexual Activity   Alcohol use: No   Drug use: No   Sexual activity: Not on file  Lifestyle   Physical activity    Days per week: Not on file    Minutes per session: Not on file   Stress: Not on file  Relationships   Social connections    Talks on phone: Not on file    Gets together: Not on file    Attends religious service: Not on file    Active member of club or organization: Not on file    Attends meetings of clubs or organizations: Not on file    Relationship status: Not on file  Other Topics Concern   Not on file  Social History Narrative   Not on file     Family History:  The patient's   family history includes Bladder Cancer in his brother; Colon cancer (age of onset: 56) in his brother; Hyperlipidemia in his  father; Hypertension in his father; Lung cancer in his father; Thyroid cancer in his mother.   ROS:   Please see the history of present illness.    Review of Systems  Constitution: Negative.  HENT: Negative.   Cardiovascular: Negative.   Respiratory: Negative.   Endocrine: Negative.   Hematologic/Lymphatic: Negative.   Musculoskeletal: Negative.   Gastrointestinal: Negative.   Genitourinary: Negative.   Neurological: Positive for vertigo.   All other systems reviewed and are negative.   PHYSICAL EXAM:   VS:  BP (!) 160/98 (BP Location: Right Arm)    Pulse 66    Temp 97.6 F (36.4 C)    Ht '6\' 2"'$  (1.88 m)  Wt 195 lb 6.4 oz (88.6 kg)    SpO2 97%    BMI 25.09 kg/m   Physical Exam  GEN: Well nourished, well developed, in no acute distress  Neck: no JVD, carotid bruits, or masses Cardiac:RRR; no murmurs, rubs, or gallops  Respiratory:  clear to auscultation bilaterally, normal work of breathing GI: soft, nontender, nondistended, + BS Ext: without cyanosis, clubbing, or edema, Good distal pulses bilaterally Neuro:  Alert and Oriented x 3 Psych: euthymic mood, full affect  Wt Readings from Last 3 Encounters:  01/10/19 195 lb 6.4 oz (88.6 kg)  09/02/18 184 lb (83.5 kg)  08/21/18 186 lb (84.4 kg)      Studies/Labs Reviewed:   EKG:  EKG is not ordered today.    Recent Labs: 08/21/2018: ALT 19; Hemoglobin 14.1; Platelets 309 12/27/2018: BUN 17; Creatinine, Ser 1.19; Potassium 4.1; Sodium 138   Lipid Panel    Component Value Date/Time   CHOL  11/28/2009 0520    152        ATP III CLASSIFICATION:  <200     mg/dL   Desirable  200-239  mg/dL   Borderline High  >=240    mg/dL   High          TRIG 78 11/28/2009 0520   HDL 36 (L) 11/28/2009 0520   CHOLHDL 4.2 11/28/2009 0520   VLDL 16 11/28/2009 0520   LDLCALC (H) 11/28/2009 0520    100        Total Cholesterol/HDL:CHD Risk Coronary Heart Disease Risk Table                     Men   Women  1/2 Average Risk   3.4   3.3   Average Risk       5.0   4.4  2 X Average Risk   9.6   7.1  3 X Average Risk  23.4   11.0        Use the calculated Patient Ratio above and the CHD Risk Table to determine the patient's CHD Risk.        ATP III CLASSIFICATION (LDL):  <100     mg/dL   Optimal  100-129  mg/dL   Near or Above                    Optimal  130-159  mg/dL   Borderline  160-189  mg/dL   High  >190     mg/dL   Very High    Additional studies/ records that were reviewed today include:     Lexiscan Myoview 08/11/2018:  There was no ST segment deviation noted during stress.  Findings consistent with large prior inferior/inferoseptal myocardial infarction with moderate peri-infarct ischemia.  This is an intermediate risk study.  The left ventricular ejection fraction is mildly decreased (49%).   Cardiac catheterization 08/17/2018:  Ost Cx to Prox Cx lesion is 20% stenosed.  Dist LM to Ost LAD lesion is 50% stenosed.  Prox LAD lesion is 30% stenosed.  Prox LAD to Mid LAD lesion is 20% stenosed.  The left ventricular systolic function is normal.  LV end diastolic pressure is normal.  The left ventricular ejection fraction is 45-50% by visual estimate.  There is no mitral valve regurgitation.   1. Moderate (40-50%) ostial LAD stenosis. This is a smooth lesion and does not appear to be flow limiting.  2. Mild non-obstructive disease in the proximal Circumflex artery 3. Large dominant RCA  with no obvious plaque.  4. Mild segmental LV systolic dysfunction, IYME=15-83%. Apical hypokinesis.    Echocardiogram 08/18/2018:  1. The left ventricle has normal systolic function with an ejection fraction of 60-65%. The cavity size was mildly dilated. Left ventricular diastolic parameters were normal.  2. The mitral valve is normal in structure.  3. The tricuspid valve is normal in structure.  4. The aortic valve is tricuspid Mild calcification of the aortic valve. Aortic valve regurgitation is mild by color  flow Doppler.  5. The pulmonic valve was normal in structure.  6. There is mild dilatation of the aortic root measuring 40 mm.     ASSESSMENT:    1. Essential hypertension   2. Mixed hyperlipidemia   3. Vertigo   4. Coronary artery disease involving native coronary artery of native heart without angina pectoris      PLAN:  In order of problems listed above:  Essential hypertension blood pressures continue to run high.  He had itching that is thought to have come from the metoprolol.  Diltiazem was stopped as well.  He is now tolerating losartan but blood pressures are still high.  Will increase losartan to 50 mg twice daily.  Follow-up be met in 2 weeks and follow-up office visit in 4 weeks.  He takes his blood pressure regularly.  2 g sodium diet discussed.  Mixed hyperlipidemia Crestor stopped because of side effects  Vertigo ongoing problem has been seen at [facility], the [place] and by PCP.  CAD nonobstructive on cath 07/2018 no angina  Medication Adjustments/Labs and Tests Ordered: Current medicines are reviewed at length with the patient today.  Concerns regarding medicines are outlined above.  Medication changes, Labs and Tests ordered today are listed in the Patient Instructions below. Patient Instructions  Medication Instructions:  INCREASE Losartan to 50 mg TWICE a day  Labwork:  BMET in 2 weeks (August 3 rd)  Procedures/Testing: none  Follow-Up: 1 month Dr.McDowell  Any Additional Special Instructions Will Be Listed Below (If Applicable).   Follow 2 gram sodium diet we have given you  If you need a refill on your cardiac medications before your next appointment, please call your pharmacy.      Thank you for choosing [facility] !           Signed, Ermalinda Barrios, PA-C  01/10/2019 1:02 PM    [facility] [address], [place], [place]  [address] Phone: [phone]; Fax: [phone]

## 2019-01-24 ENCOUNTER — Other Ambulatory Visit (HOSPITAL_COMMUNITY)
Admission: RE | Admit: 2019-01-24 | Discharge: 2019-01-24 | Disposition: A | Discharge status: Home / Self Care | Payer: PPO | Source: Ambulatory Visit | Attending: Physician Assistant | Admitting: Physician Assistant

## 2019-01-24 ENCOUNTER — Other Ambulatory Visit: Payer: Self-pay

## 2019-01-24 DIAGNOSIS — I10 Essential (primary) hypertension: Secondary | ICD-10-CM | POA: Insufficient documentation

## 2019-01-24 LAB — BASIC METABOLIC PANEL
Anion gap: 8 (ref 5–15)
BUN: 14 mg/dL (ref 8–23)
CO2: 27 mmol/L (ref 22–32)
Calcium: 9.2 mg/dL (ref 8.9–10.3)
Chloride: 106 mmol/L (ref 98–111)
Creatinine, Ser: 1.17 mg/dL (ref 0.61–1.24)
GFR calc Af Amer: >60 mL/min (ref >60)
GFR calc non Af Amer: >60 mL/min (ref >60)
Glucose, Bld: 102 mg/dL — ABNORMAL HIGH (ref 70–99)
Potassium: 4.4 mmol/L (ref 3.5–5.1)
Sodium: 141 mmol/L (ref 135–145)

## 2019-01-31 ENCOUNTER — Telehealth: Payer: Self-pay

## 2019-01-31 MED ORDER — AMLODIPINE BESYLATE 5 MG PO TABS: 5.0000 mg | ORAL_TABLET | Freq: Every day | ORAL | 3 refills | Status: DC

## 2019-01-31 NOTE — Telephone Encounter (Signed)
Ermalinda Barrios PA-C reviewed patient BP ;og and recommends starting amlodipine 5 mg daily.i will also mail patient 2 Gm sodium guide

## 2019-02-17 DIAGNOSIS — M25512 Pain in left shoulder: Secondary | ICD-10-CM | POA: Diagnosis not present

## 2019-02-24 ENCOUNTER — Other Ambulatory Visit (HOSPITAL_COMMUNITY)
Admission: RE | Admit: 2019-02-24 | Discharge: 2019-02-24 | Disposition: A | Discharge status: Home / Self Care | Payer: PPO | Source: Ambulatory Visit | Attending: Cardiology | Admitting: Cardiology

## 2019-02-24 DIAGNOSIS — E7849 Other hyperlipidemia: Secondary | ICD-10-CM | POA: Diagnosis not present

## 2019-02-24 DIAGNOSIS — I25119 Atherosclerotic heart disease of native coronary artery with unspecified angina pectoris: Secondary | ICD-10-CM | POA: Insufficient documentation

## 2019-02-24 LAB — HEPATIC FUNCTION PANEL
ALT: 13 U/L (ref 0–44)
AST: 11 U/L — ABNORMAL LOW (ref 15–41)
Albumin: 4.3 g/dL (ref 3.5–5.0)
Alkaline Phosphatase: 87 U/L (ref 38–126)
Bilirubin, Direct: 0.1 mg/dL (ref 0.0–0.2)
Indirect Bilirubin: 0.8 mg/dL (ref 0.3–0.9)
Total Bilirubin: 0.9 mg/dL (ref 0.3–1.2)
Total Protein: 7 g/dL (ref 6.5–8.1)

## 2019-02-24 LAB — LIPID PANEL
Cholesterol: 229 mg/dL — ABNORMAL HIGH (ref 0–200)
HDL: 47 mg/dL (ref >40)
LDL Cholesterol: 171 mg/dL — ABNORMAL HIGH (ref 0–99)
Total CHOL/HDL Ratio: 4.9 RATIO
Triglycerides: 55 mg/dL (ref <150)
VLDL: 11 mg/dL (ref 0–40)

## 2019-02-25 ENCOUNTER — Telehealth: Payer: Self-pay | Admitting: Cardiology

## 2019-02-25 DIAGNOSIS — E782 Mixed hyperlipidemia: Secondary | ICD-10-CM

## 2019-02-25 MED ORDER — ROSUVASTATIN CALCIUM 20 MG PO TABS: 20.0000 mg | ORAL_TABLET | Freq: Every day | ORAL | 4 refills | Status: DC

## 2019-02-25 NOTE — Telephone Encounter (Signed)
If he goes back on Crestor, would repeat lipid panel in about 8 to 12 weeks.

## 2019-02-25 NOTE — Telephone Encounter (Signed)
Satira Sark, MD  Bernita Raisin, RN        Results reviewed. Patient taken off Crestor previously with concern about possible side effects. His cholesterol has gone back up significantly with LDL 171 and total cholesterol 229. Please check with him to see if holding Crestor made any difference in his symptoms. If not I would plan to resume it. If so we may need to find a different agent.   Associated Results  Result     Patient states he felt no different off crestor and requested I call in a refill for him    When do you want repeat Lipids ?

## 2019-02-25 NOTE — Telephone Encounter (Signed)
Returned call for lab results.  

## 2019-02-25 NOTE — Telephone Encounter (Signed)
Mailed lab slip to patient

## 2019-02-25 NOTE — Addendum Note (Signed)
Addended by: Barbarann Ehlers A on: 02/25/2019 02:33 PM   Modules accepted: Orders

## 2019-03-02 ENCOUNTER — Ambulatory Visit: Payer: No Typology Code available for payment source | Admitting: Cardiology

## 2019-03-29 DIAGNOSIS — Z23 Encounter for immunization: Secondary | ICD-10-CM | POA: Diagnosis not present

## 2019-03-31 DIAGNOSIS — M25512 Pain in left shoulder: Secondary | ICD-10-CM | POA: Diagnosis not present

## 2019-04-06 ENCOUNTER — Telehealth: Payer: Self-pay | Admitting: Cardiology

## 2019-04-06 NOTE — Telephone Encounter (Signed)
Pt and wife called and report that pt is have episodes over the last 2 weeks of hot flashes that lead to nausea, sweats, and weakness and SOB. Wife states that episodes can last from 30 mins to 7 hours. Pt does report chest pressure at this time. He states that he thinks it is his blood pressure meds. He states he has to sleep in the recliner at night. Current BP are as follows.. 139/83 63 127/66 69  121/79 79 138/77 68 109/65 65 Please advise.

## 2019-04-06 NOTE — Telephone Encounter (Signed)
Pt notified and placed on wait list for [facility].

## 2019-04-06 NOTE — Telephone Encounter (Signed)
Patient left message requesting return phone call regarding multiple symptoms. / tg

## 2019-04-06 NOTE — Telephone Encounter (Signed)
Please schedule an office visit with Andrew Bates for further review.  If he feels that his medicines may be causing symptoms, these will need to be reviewed further.  The blood pressures reported would not be expected to be causing him any symptoms.

## 2019-04-08 ENCOUNTER — Emergency Department (HOSPITAL_COMMUNITY): Payer: PPO

## 2019-04-08 ENCOUNTER — Other Ambulatory Visit: Payer: Self-pay

## 2019-04-08 ENCOUNTER — Emergency Department (HOSPITAL_COMMUNITY)
Admission: EM | Admit: 2019-04-08 | Discharge: 2019-04-08 | Disposition: A | Discharge status: Home / Self Care | Payer: PPO | Attending: Emergency Medicine | Admitting: Emergency Medicine

## 2019-04-08 ENCOUNTER — Encounter (HOSPITAL_COMMUNITY): Payer: Self-pay | Admitting: Emergency Medicine

## 2019-04-08 DIAGNOSIS — R110 Nausea: Secondary | ICD-10-CM | POA: Insufficient documentation

## 2019-04-08 DIAGNOSIS — Z87891 Personal history of nicotine dependence: Secondary | ICD-10-CM | POA: Diagnosis not present

## 2019-04-08 DIAGNOSIS — R5383 Other fatigue: Secondary | ICD-10-CM | POA: Insufficient documentation

## 2019-04-08 DIAGNOSIS — Z7982 Long term (current) use of aspirin: Secondary | ICD-10-CM | POA: Insufficient documentation

## 2019-04-08 DIAGNOSIS — Z6825 Body mass index (BMI) 25.0-25.9, adult: Secondary | ICD-10-CM | POA: Diagnosis not present

## 2019-04-08 DIAGNOSIS — J45909 Unspecified asthma, uncomplicated: Secondary | ICD-10-CM | POA: Insufficient documentation

## 2019-04-08 DIAGNOSIS — Z20828 Contact with and (suspected) exposure to other viral communicable diseases: Secondary | ICD-10-CM | POA: Insufficient documentation

## 2019-04-08 DIAGNOSIS — Z20822 Contact with and (suspected) exposure to covid-19: Secondary | ICD-10-CM

## 2019-04-08 DIAGNOSIS — I10 Essential (primary) hypertension: Secondary | ICD-10-CM | POA: Insufficient documentation

## 2019-04-08 DIAGNOSIS — Z79899 Other long term (current) drug therapy: Secondary | ICD-10-CM | POA: Insufficient documentation

## 2019-04-08 DIAGNOSIS — I259 Chronic ischemic heart disease, unspecified: Secondary | ICD-10-CM | POA: Diagnosis not present

## 2019-04-08 DIAGNOSIS — B349 Viral infection, unspecified: Secondary | ICD-10-CM | POA: Diagnosis not present

## 2019-04-08 DIAGNOSIS — R0602 Shortness of breath: Secondary | ICD-10-CM | POA: Diagnosis not present

## 2019-04-08 DIAGNOSIS — R509 Fever, unspecified: Secondary | ICD-10-CM | POA: Diagnosis not present

## 2019-04-08 DIAGNOSIS — R001 Bradycardia, unspecified: Secondary | ICD-10-CM | POA: Diagnosis not present

## 2019-04-08 LAB — CBC WITH DIFFERENTIAL/PLATELET
Abs Immature Granulocytes: 0.03 10*3/uL (ref 0.00–0.07)
Basophils Absolute: 0.1 10*3/uL (ref 0.0–0.1)
Basophils Relative: 1 %
Eosinophils Absolute: 0.3 10*3/uL (ref 0.0–0.5)
Eosinophils Relative: 4 %
HCT: 45.4 % (ref 39.0–52.0)
Hemoglobin: 15.1 g/dL (ref 13.0–17.0)
Immature Granulocytes: 0 %
Lymphocytes Relative: 24 %
Lymphs Abs: 1.9 10*3/uL (ref 0.7–4.0)
MCH: 33 pg (ref 26.0–34.0)
MCHC: 33.3 g/dL (ref 30.0–36.0)
MCV: 99.1 fL (ref 80.0–100.0)
Monocytes Absolute: 0.8 10*3/uL (ref 0.1–1.0)
Monocytes Relative: 10 %
Neutro Abs: 4.9 10*3/uL (ref 1.7–7.7)
Neutrophils Relative %: 61 %
Platelets: 313 10*3/uL (ref 150–400)
RBC: 4.58 MIL/uL (ref 4.22–5.81)
RDW: 13 % (ref 11.5–15.5)
WBC: 8 10*3/uL (ref 4.0–10.5)
nRBC: 0 % (ref 0.0–0.2)

## 2019-04-08 LAB — URINALYSIS, ROUTINE W REFLEX MICROSCOPIC
Bilirubin Urine: NEGATIVE
Glucose, UA: NEGATIVE mg/dL
Hgb urine dipstick: NEGATIVE
Ketones, ur: NEGATIVE mg/dL
Leukocytes,Ua: NEGATIVE
Nitrite: NEGATIVE
Protein, ur: NEGATIVE mg/dL
Specific Gravity, Urine: 1.015 (ref 1.005–1.030)
pH: 6 (ref 5.0–8.0)

## 2019-04-08 LAB — LIPASE, BLOOD: Lipase: 23 U/L (ref 11–51)

## 2019-04-08 LAB — COMPREHENSIVE METABOLIC PANEL
ALT: 19 U/L (ref 0–44)
AST: 15 U/L (ref 15–41)
Albumin: 4.5 g/dL (ref 3.5–5.0)
Alkaline Phosphatase: 77 U/L (ref 38–126)
Anion gap: 6 (ref 5–15)
BUN: 15 mg/dL (ref 8–23)
CO2: 25 mmol/L (ref 22–32)
Calcium: 9.1 mg/dL (ref 8.9–10.3)
Chloride: 107 mmol/L (ref 98–111)
Creatinine, Ser: 1.07 mg/dL (ref 0.61–1.24)
GFR calc Af Amer: >60 mL/min (ref >60)
GFR calc non Af Amer: >60 mL/min (ref >60)
Glucose, Bld: 99 mg/dL (ref 70–99)
Potassium: 3.8 mmol/L (ref 3.5–5.1)
Sodium: 138 mmol/L (ref 135–145)
Total Bilirubin: 0.7 mg/dL (ref 0.3–1.2)
Total Protein: 7.2 g/dL (ref 6.5–8.1)

## 2019-04-08 NOTE — ED Notes (Signed)
Pt ambulatory to waiting room. Pt verbalized understanding of discharge instructions.   

## 2019-04-08 NOTE — ED Triage Notes (Signed)
PT states he has had tremors, hot flashes, nausea with some vomiting, intermittent dizziness and SOB on exertion x11 days. PT was sent by his PCP [facility] today for evaluation.

## 2019-04-08 NOTE — Discharge Instructions (Addendum)
Outpatient Covid testing performed tonight should have results in 1 to 2 days.  If negative then clearly additional work-up needs to be done.  Would recommend full thyroid function testing.  Would recommend perhaps even testing for possible carcinoid tumors.  Would recommend CT scan abdomen and chest to rule out any kind of abscess in particular liver abscess.  However today's vital signs chest x-ray CBC and complete metabolic panel without any abnormalities at all.  Return for any new or worse symptoms.

## 2019-04-08 NOTE — ED Provider Notes (Addendum)
[facility] EMERGENCY DEPARTMENT Provider Note   CSN: [id number] Arrival date & time: 04/08/19  1541     History   Chief Complaint Chief Complaint  Patient presents with  . Fatigue    HPI HINTON COTA is a 73 y.o. male.     Patient now with 11 days of symptoms.  To include decreased appetite weight loss.  Episodic spells of what sounds like diaphoresis fever chills and myalgias.  They are episodic in nature.  No real respiratory symptoms.  Also associated with nausea.  But no vomiting or diarrhea.  Patient seen by primary care provider today and referred in for further evaluation.  Patient has not had Covid testing.  Patient's wife without symptoms.     Past Medical History:  Diagnosis Date  . Adenomatous colon polyp   . Anxiety   . Arthritis   . Asthma   . Complication of anesthesia    slow to come back awake- per patient/ and makes him sick per wife  . DVT (deep venous thrombosis) ([place]) 2008  . Essential hypertension   . GERD (gastroesophageal reflux disease)   . History of kidney stones   . Hyperlipidemia   . Internal hemorrhoids   . Peripheral neuropathy   . Post traumatic stress disorder (PTSD)   . Sleep apnea   . Vertigo     Patient Active Problem List   Diagnosis Date Noted  . CAD (coronary artery disease) 01/10/2019  . Unstable angina ([place]) 08/17/2018  . Precordial pain   . Cervical spondylosis with radiculopathy 07/09/2015  . Urinary frequency 02/25/2015  . Dehydration 02/25/2015  . Vertigo 02/25/2015  . Hydronephrosis 02/15/2014  . Acute renal failure ([place]) 02/15/2014  . Calculus of ureter 02/14/2014  . Urinary hesitancy 11/17/2013  . Overweight(278.02) 11/17/2013  . Acute pyelonephritis 11/16/2013  . Kidney stones 11/16/2013  . Acute onset of severe vertigo 11/16/2013  . Sepsis secondary to UTI ([place]) 11/16/2013  . Hypertension 11/16/2013  . Hyperlipemia 11/16/2013  . GERD (gastroesophageal reflux disease) 11/16/2013    Past Surgical  History:  Procedure Laterality Date  . ANTERIOR CERVICAL DECOMP/DISCECTOMY FUSION N/A 07/09/2015   Procedure: ANTERIOR CERVICAL DECOMPRESSION/DISCECTOMY FUSION CERVICAL FIVE-SIX,CERVICAL SIX-SEVEN;  Surgeon: Newman Pies, MD;  Location: [place] NEURO ORS;  Service: Neurosurgery;  Laterality: N/A;  . CARPAL TUNNEL RELEASE Bilateral 2014  . COLONOSCOPY    . CYSTOSCOPY/RETROGRADE/URETEROSCOPY/STONE EXTRACTION WITH BASKET Left 02/14/2014   Procedure: CYSTOSCOPY AND LEFT DOUBLE J STENT PLACEMENT;  ATTEMPTED STONE  EXTRACTION ;  Surgeon: Jorja Loa, MD;  Location: [facility];  Service: Urology;  Laterality: Left;  . ESOPHAGOGASTRODUODENOSCOPY ENDOSCOPY    . KNEE SURGERY Right 2006   Arthroscopy - MCl tear  . LEFT HEART CATH AND CORONARY ANGIOGRAPHY N/A 08/17/2018   Procedure: LEFT HEART CATH AND CORONARY ANGIOGRAPHY;  Surgeon: Burnell Blanks, MD;  Location: [place] CV LAB;  Service: Cardiovascular;  Laterality: N/A;        Home Medications    Prior to Admission medications   Medication Sig Start Date End Date Taking? Authorizing Provider  acetaminophen (TYLENOL) 500 MG tablet Take 500 mg by mouth every 6 (six) hours as needed for fever.   Yes [provider]  amLODipine (NORVASC) 5 MG tablet Take 1 tablet (5 mg total) by mouth daily. 01/31/19 05/01/19 Yes Imogene Burn, PA-C  Artificial Tear Ointment (DRY EYES OP) Apply 1 application to eye as needed (dry eyes).    Yes [provider]  aspirin  EC 81 MG EC tablet Take 1 tablet (81 mg total) by mouth daily. 08/19/18  Yes Kathyrn Drown D, NP  Biotin (BIOTIN 5000) 5 MG CAPS Take 10 mg by mouth daily.    Yes [provider]  fluorometholone (FML) 0.1 % ophthalmic suspension Place 1 drop into both eyes 3 (three) times daily.  03/29/19  Yes [provider]  losartan (COZAAR) 50 MG tablet Take 1 tablet (50 mg total) by mouth 2 (two) times a day. 01/10/19 04/10/19 Yes Imogene Burn, PA-C  mirtazapine  (REMERON) 45 MG tablet Take 45 mg by mouth at bedtime.   Yes [provider]  nitroGLYCERIN (NITROSTAT) 0.4 MG SL tablet Place 1 tablet (0.4 mg total) under the tongue every 5 (five) minutes x 3 doses as needed for chest pain. 08/18/18  Yes Kathyrn Drown D, NP  omeprazole (PRILOSEC) 20 MG capsule Take 20 mg by mouth every morning.    Yes [provider]  ondansetron (ZOFRAN ODT) 4 MG disintegrating tablet 4mg  ODT q4 hours prn nausea/vomit Patient taking differently: Take 4 mg by mouth every 4 (four) hours as needed for nausea or vomiting.  07/31/18  Yes Milton Ferguson, MD  psyllium (METAMUCIL) 58.6 % powder Take 1 packet by mouth daily.    Yes [provider]  rosuvastatin (CRESTOR) 20 MG tablet Take 1 tablet (20 mg total) by mouth daily at 6 PM. 02/25/19  Yes Satira Sark, MD    Family History Family History  Problem Relation Age of Onset  . Hypertension Father   . Hyperlipidemia Father   . Lung cancer Father        from piece of abestos lodged in his lung  . Thyroid cancer Mother   . Colon cancer Brother 32  . Bladder Cancer Brother     Social History Social History   Tobacco Use  . Smoking status: Former Smoker    Types: Cigars  . Smokeless tobacco: Never Used  . Tobacco comment: quit 15 years ago (2017) never smoked alot  Substance Use Topics  . Alcohol use: No  . Drug use: No     Allergies   Other, Tamsulosin, Codeine, Morphine and related, Pentazocine, Metoprolol, Sertraline, and Topiramate   Review of Systems Review of Systems  Constitutional: Positive for appetite change, chills, diaphoresis, fatigue and fever.  HENT: Negative for congestion, rhinorrhea and sore throat.   Eyes: Negative for visual disturbance.  Respiratory: Negative for cough and shortness of breath.   Cardiovascular: Negative for chest pain and leg swelling.  Gastrointestinal: Negative for abdominal pain, diarrhea, nausea and vomiting.  Genitourinary: Negative for  dysuria.  Musculoskeletal: Positive for myalgias. Negative for back pain and neck pain.  Skin: Negative for rash.  Neurological: Negative for dizziness, light-headedness and headaches.  Hematological: Does not bruise/bleed easily.  Psychiatric/Behavioral: Negative for confusion.     Physical Exam Updated Vital Signs BP (!) 152/98 (BP Location: Right Arm)   Pulse 60   Temp 98.2 F (36.8 C) (Oral)   Resp 18   Ht 1.854 m (6\' 1" )   Wt 85.3 kg   SpO2 99%   BMI 24.80 kg/m   Physical Exam Vitals signs and nursing note reviewed.  Constitutional:      General: He is not in acute distress.    Appearance: Normal appearance. He is well-developed. He is not ill-appearing or toxic-appearing.  HENT:     Head: Normocephalic and atraumatic.  Eyes:     Extraocular Movements: Extraocular movements  intact.     Conjunctiva/sclera: Conjunctivae normal.     Pupils: Pupils are equal, round, and reactive to light.  Neck:     Musculoskeletal: Normal range of motion and neck supple.  Cardiovascular:     Rate and Rhythm: Normal rate and regular rhythm.     Heart sounds: No murmur.  Pulmonary:     Effort: Pulmonary effort is normal. No respiratory distress.     Breath sounds: Normal breath sounds.  Abdominal:     Palpations: Abdomen is soft.     Tenderness: There is no abdominal tenderness.  Musculoskeletal: Normal range of motion.        General: No swelling.  Skin:    General: Skin is warm and dry.     Capillary Refill: Capillary refill takes less than 2 seconds.  Neurological:     General: No focal deficit present.     Mental Status: He is alert and oriented to person, place, and time.      ED Treatments / Results  Labs (all labs ordered are listed, but only abnormal results are displayed) Labs Reviewed  NOVEL CORONAVIRUS, NAA (HOSP ORDER, SEND-OUT TO REF LAB; TAT 18-24 HRS)  CBC WITH DIFFERENTIAL/PLATELET  COMPREHENSIVE METABOLIC PANEL  LIPASE, BLOOD  URINALYSIS, ROUTINE W  REFLEX MICROSCOPIC    EKG EKG Interpretation  Date/Time:  Friday April 08 2019 19:51:58 EDT Ventricular Rate:  56 PR Interval:    QRS Duration: 110 QT Interval:  453 QTC Calculation: 438 R Axis:   5 Text Interpretation:  Sinus rhythm Abnormal R-wave progression, early transition ? incomplete RBBB Confirmed by Fredia Sorrow [phone]) on 04/08/2019 10:26:40 PM   Radiology Dg Chest Portable 1 View  Result Date: 04/08/2019 CLINICAL DATA:  Shortness of breath EXAM: PORTABLE CHEST 1 VIEW COMPARISON:  August 21, 2018 FINDINGS: The heart size and mediastinal contours are within normal limits. Both lungs are clear. The visualized skeletal structures are unremarkable. Cervical spine fixation hardware seen. IMPRESSION: No acute cardiopulmonary process. Electronically Signed   By: Prudencio Pair M.D.   On: 04/08/2019 20:34    Procedures Procedures (including critical care time)  Medications Ordered in ED Medications - No data to display   Initial Impression / Assessment and Plan / ED Course  I have reviewed the triage vital signs and the nursing notes.  Pertinent labs & imaging results that were available during my care of the patient were reviewed by me and considered in my medical decision making (see chart for details).        Patient's general work-up here today vital signs cardiac monitoring chest x-ray CBC complete metabolic panel without any significant abnormalities.  Recommend that we first rule out Covid infection.  If ruled out then would look further for considerations possible liver abscess possible carcinoid tumor.  Thyroid function needs to be thoroughly evaluated.  Also would recommend CT chest abdomen and pelvis to rule out any kind of concerns for tumor.  In addition patient's urinalysis is normal as well.  Final Clinical Impressions(s) / ED Diagnoses   Final diagnoses:  Suspected COVID-19 virus infection    ED Discharge Orders    None       Fredia Sorrow, MD 04/08/19 2210    Fredia Sorrow, MD 04/08/19 2226

## 2019-04-10 LAB — NOVEL CORONAVIRUS, NAA (HOSP ORDER, SEND-OUT TO REF LAB; TAT 18-24 HRS): SARS-CoV-2, NAA: NOT DETECTED

## 2019-04-12 ENCOUNTER — Encounter: Payer: Self-pay | Admitting: Cardiology

## 2019-04-12 ENCOUNTER — Other Ambulatory Visit: Payer: Self-pay

## 2019-04-12 ENCOUNTER — Ambulatory Visit (INDEPENDENT_AMBULATORY_CARE_PROVIDER_SITE_OTHER): Payer: PPO | Admitting: Urology

## 2019-04-12 ENCOUNTER — Ambulatory Visit (INDEPENDENT_AMBULATORY_CARE_PROVIDER_SITE_OTHER): Payer: PPO | Admitting: Cardiology

## 2019-04-12 VITALS — BP 142/78 | HR 84 | Temp 97.5°F | Ht 73.0 in | Wt 193.0 lb

## 2019-04-12 DIAGNOSIS — N5201 Erectile dysfunction due to arterial insufficiency: Secondary | ICD-10-CM

## 2019-04-12 DIAGNOSIS — I25119 Atherosclerotic heart disease of native coronary artery with unspecified angina pectoris: Secondary | ICD-10-CM

## 2019-04-12 DIAGNOSIS — R55 Syncope and collapse: Secondary | ICD-10-CM | POA: Diagnosis not present

## 2019-04-12 DIAGNOSIS — N200 Calculus of kidney: Secondary | ICD-10-CM

## 2019-04-12 DIAGNOSIS — I10 Essential (primary) hypertension: Secondary | ICD-10-CM | POA: Diagnosis not present

## 2019-04-12 DIAGNOSIS — R300 Dysuria: Secondary | ICD-10-CM | POA: Diagnosis not present

## 2019-04-12 DIAGNOSIS — G64 Other disorders of peripheral nervous system: Secondary | ICD-10-CM | POA: Diagnosis not present

## 2019-04-12 DIAGNOSIS — I7300 Raynaud's syndrome without gangrene: Secondary | ICD-10-CM | POA: Diagnosis not present

## 2019-04-12 DIAGNOSIS — E785 Hyperlipidemia, unspecified: Secondary | ICD-10-CM | POA: Diagnosis not present

## 2019-04-12 DIAGNOSIS — G450 Vertebro-basilar artery syndrome: Secondary | ICD-10-CM | POA: Diagnosis not present

## 2019-04-12 DIAGNOSIS — R42 Dizziness and giddiness: Secondary | ICD-10-CM | POA: Diagnosis not present

## 2019-04-12 NOTE — Patient Instructions (Signed)
Medication Instructions:  Your physician recommends that you continue on your current medications as directed. Please refer to the Current Medication list given to you today.  *If you need a refill on your cardiac medications before your next appointment, please call your pharmacy*  Lab Work: None today If you have labs (blood work) drawn today and your tests are completely normal, you will receive your results only by: Marland Kitchen MyChart Message (if you have MyChart) OR . A paper copy in the mail If you have any lab test that is abnormal or we need to change your treatment, we will call you to review the results.  Testing/Procedures: Your physician has recommended that you wear an event monitor for 7 days. Event monitors are medical devices that record the heart's electrical activity. Doctors most often [place] these monitors to diagnose arrhythmias. Arrhythmias are problems with the speed or rhythm of the heartbeat. The monitor is a small, portable device. You can wear one while you do your normal daily activities. This is usually used to diagnose what is causing palpitations/syncope (passing out).    Follow-Up: At [facility], you and your health needs are our priority.  As part of our continuing mission to provide you with exceptional heart care, we have created designated Provider Care Teams.  These Care Teams include your primary Cardiologist (physician) and Advanced Practice Providers (APPs -  Physician Assistants and Nurse Practitioners) who all work together to provide you with the care you need, when you need it.  Your next appointment:   We will determine after monitor finished  The format for your next appointment:   [place]     Thank you for choosing [facility] !

## 2019-04-12 NOTE — Progress Notes (Signed)
Cardiology Office Note  Date: 04/12/2019   ID: Andrew Bates, Andrew Bates [birth year], MRN [id number]  PCP:  Sharilyn Sites, MD  Cardiologist:  Rozann Lesches, MD Electrophysiologist:  None   Chief Complaint  Patient presents with   Spells    History of Present Illness: Andrew Bates is a 73 y.o. male last seen in the office by Ms. Vita Barley in July.  He presents for an office visit, recent telephone notes reviewed in terms of symptoms and blood pressure trend.  I see that he was just evaluated in the ER on October 16 as well describing multiple symptoms, he had been seen by his primary care provider and referred to the ER.  SARS coronavirus 2 test was negative.  Remaining lab work also unremarkable.  Chest x-ray showed no acute process.  ECG showed normal sinus rhythm.  He presents today for a visit and we discussed his symptoms.  He states that about 3 weeks ago he had done some weed eating outdoors, suddenly felt lightheaded and flushed from his waist to his head.  He was short of breath and also diaphoretic.  He laid down, his wife gave him a cold compress on his neck, but symptoms worsened.  He became nauseous and had emesis.  His wife recorded a video of him during that time, he was laying on his side, legs bent, grunting, shaking, not entirely clear that this was a seizure.  He states that symptoms lasted for several hours and then resolved.  Since then he has had milder episodes, not necessarily precipitated by exertion or standing, he has had some while sitting in his recliner.  No frank syncope.  No obvious fevers or chills.  No rashes.  Today he tells me that he feels "fine."  Mild headache this morning but otherwise no complaints.  Previous cardiac testing is reviewed below.  He underwent a cardiac catheterization in February of this year that demonstrated mild to moderate atherosclerosis, nonobstructive.  Echocardiogram at that time showed normal LVEF.  Past Medical History:    Diagnosis Date   Adenomatous colon polyp    Anxiety    Arthritis    Asthma    Complication of anesthesia    slow to come back awake- per patient/ and makes him sick per wife   DVT (deep venous thrombosis) ([place]) 2008   Essential hypertension    GERD (gastroesophageal reflux disease)    History of kidney stones    Hyperlipidemia    Internal hemorrhoids    Peripheral neuropathy    Post traumatic stress disorder (PTSD)    Sleep apnea    Vertigo     Past Surgical History:  Procedure Laterality Date   ANTERIOR CERVICAL DECOMP/DISCECTOMY FUSION N/A 07/09/2015   Procedure: ANTERIOR CERVICAL DECOMPRESSION/DISCECTOMY FUSION CERVICAL FIVE-SIX,CERVICAL SIX-SEVEN;  Surgeon: Newman Pies, MD;  Location: [facility];  Service: Neurosurgery;  Laterality: N/A;   CARPAL TUNNEL RELEASE Bilateral 2014   COLONOSCOPY     CYSTOSCOPY/RETROGRADE/URETEROSCOPY/STONE EXTRACTION WITH BASKET Left 02/14/2014   Procedure: CYSTOSCOPY AND LEFT DOUBLE J STENT PLACEMENT;  ATTEMPTED STONE  EXTRACTION ;  Surgeon: Jorja Loa, MD;  Location: [facility];  Service: Urology;  Laterality: Left;   ESOPHAGOGASTRODUODENOSCOPY ENDOSCOPY     KNEE SURGERY Right 2006   Arthroscopy - MCl tear   LEFT HEART CATH AND CORONARY ANGIOGRAPHY N/A 08/17/2018   Procedure: LEFT HEART CATH AND CORONARY ANGIOGRAPHY;  Surgeon: Burnell Blanks, MD;  Location: [place] CV LAB;  Service: Cardiovascular;  Laterality: N/A;    Current Outpatient Medications  Medication Sig Dispense Refill   acetaminophen (TYLENOL) 500 MG tablet Take 500 mg by mouth every 6 (six) hours as needed for fever.     amLODipine (NORVASC) 5 MG tablet Take 1 tablet (5 mg total) by mouth daily. 90 tablet 3   Artificial Tear Ointment (DRY EYES OP) Apply 1 application to eye as needed (dry eyes).      aspirin EC 81 MG EC tablet Take 1 tablet (81 mg total) by mouth daily.     Biotin (BIOTIN 5000) 5 MG CAPS Take 10 mg by mouth daily.       fluorometholone (FML) 0.1 % ophthalmic suspension Place 1 drop into both eyes 3 (three) times daily.      losartan (COZAAR) 50 MG tablet Take 1 tablet (50 mg total) by mouth 2 (two) times a day. 180 tablet 3   mirtazapine (REMERON) 45 MG tablet Take 45 mg by mouth at bedtime.     nitroGLYCERIN (NITROSTAT) 0.4 MG SL tablet Place 1 tablet (0.4 mg total) under the tongue every 5 (five) minutes x 3 doses as needed for chest pain. 25 tablet 0   omeprazole (PRILOSEC) 20 MG capsule Take 20 mg by mouth every morning.      ondansetron (ZOFRAN ODT) 4 MG disintegrating tablet 4mg  ODT q4 hours prn nausea/vomit (Patient taking differently: Take 4 mg by mouth every 4 (four) hours as needed for nausea or vomiting. ) 20 tablet 0   psyllium (METAMUCIL) 58.6 % powder Take 1 packet by mouth daily.      rosuvastatin (CRESTOR) 20 MG tablet Take 1 tablet (20 mg total) by mouth daily at 6 PM. 90 tablet 4   No current facility-administered medications for this visit.    Allergies:  Other, Tamsulosin, Codeine, Morphine and related, Pentazocine, Metoprolol, Sertraline, and Topiramate   Social History: The patient  reports that he has quit smoking. His smoking use included cigars. He has never used smokeless tobacco. He reports that he does not drink alcohol or use drugs.   ROS:  Please see the history of present illness. Otherwise, complete review of systems is positive for none.  All other systems are reviewed and negative.   Physical Exam: VS:  BP (!) 142/78    Pulse 84    Temp (!) 97.5 F (36.4 C)    Ht 6\' 1"  (1.854 m)    Wt 193 lb (87.5 kg)    SpO2 98%    BMI 25.46 kg/m , BMI Body mass index is 25.46 kg/m.  Wt Readings from Last 3 Encounters:  04/12/19 193 lb (87.5 kg)  04/08/19 188 lb (85.3 kg)  01/10/19 195 lb 6.4 oz (88.6 kg)    General: Patient appears comfortable at rest. HEENT: Conjunctiva and lids normal, wearing a mask. Neck: Supple, no elevated JVP or carotid bruits, no  thyromegaly. Lungs: Clear to auscultation, nonlabored breathing at rest. Cardiac: Regular rate and rhythm, no S3 or significant systolic murmur, no pericardial rub. Abdomen: Soft, nontender, bowel sounds present, no guarding or rebound. Extremities: No pitting edema, distal pulses 2+. Skin: Warm and dry. Musculoskeletal: No kyphosis. Neuropsychiatric: Alert and oriented x3, affect grossly appropriate.  ECG:  An ECG dated 04/08/2019 was personally reviewed today and demonstrated:  Sinus rhythm.  Recent Labwork: 04/08/2019: ALT 19; AST 15; BUN 15; Creatinine, Ser 1.07; Hemoglobin 15.1; Platelets 313; Potassium 3.8; Sodium 138     Component Value Date/Time   CHOL  229 (H) 02/24/2019 0838   TRIG 55 02/24/2019 0838   HDL 47 02/24/2019 0838   CHOLHDL 4.9 02/24/2019 0838   VLDL 11 02/24/2019 0838   LDLCALC 171 (H) 02/24/2019 0838    Other Studies Reviewed Today:  Cardiac catheterization 08/17/2018:  Ost Cx to Prox Cx lesion is 20% stenosed.  Dist LM to Ost LAD lesion is 50% stenosed.  Prox LAD lesion is 30% stenosed.  Prox LAD to Mid LAD lesion is 20% stenosed.  The left ventricular systolic function is normal.  LV end diastolic pressure is normal.  The left ventricular ejection fraction is 45-50% by visual estimate.  There is no mitral valve regurgitation.   1. Moderate (40-50%) ostial LAD stenosis. This is a smooth lesion and does not appear to be flow limiting.  2. Mild non-obstructive disease in the proximal Circumflex artery 3. Large dominant RCA with no obvious plaque.  4. Mild segmental LV systolic dysfunction, 99991111. Apical hypokinesis.   Recommendations: Medical management of CAD. I do not the in the smooth, moderate narrowing of the ostial/proximal LAD is causing his chest pain. Echo in am to better assess LV systolic function.   Echocardiogram 08/18/2018:  1. The left ventricle has normal systolic function with an ejection fraction of 60-65%. The cavity size  was mildly dilated. Left ventricular diastolic parameters were normal.  2. The mitral valve is normal in structure.  3. The tricuspid valve is normal in structure.  4. The aortic valve is tricuspid Mild calcification of the aortic valve. Aortic valve regurgitation is mild by color flow Doppler.  5. The pulmonic valve was normal in structure.  6. There is mild dilatation of the aortic root measuring 40 mm.  Assessment and Plan:  1.  Spells as outlined above, etiology uncertain at this point.  It is unlikely that this is related to his cardiovascular disease based on work-up from earlier this year at which point he had only mild to moderate coronary atherosclerosis and normal LVEF.  Exclusion of paroxysmal arrhythmia or pauses was discussed today, we will provide a 7-day Zio patch.  He states the symptoms happen sometimes is much is daily at this point to mild degree typically.  Otherwise, he is following with his PCP, states that he had thyroid studies done recently and may undergo further CT imaging of the abdomen.  We will inform him of the monitor results.  2.  Hypertension, systolic is in the 0000000.  I reviewed outpatient blood pressure checks from telephone communication.  No obvious hypotension.  He is currently on Cozaar and Norvasc.  3.  Mixed hyperlipidemia, on Crestor.  Medication Adjustments/Labs and Tests Ordered: Current medicines are reviewed at length with the patient today.  Concerns regarding medicines are outlined above.   Tests Ordered: Orders Placed This Encounter  Procedures   Cardiac event monitor    Medication Changes: No orders of the defined types were placed in this encounter.   Disposition:  Follow up test results.  Signed, Satira Sark, MD, [facility] 04/12/2019 1:22 PM    [facility] at [facility] [address] [place], [place] [address] Phone: [phone]; Fax: [phone]

## 2019-04-15 DIAGNOSIS — R42 Dizziness and giddiness: Secondary | ICD-10-CM | POA: Diagnosis not present

## 2019-04-15 DIAGNOSIS — H832X2 Labyrinthine dysfunction, left ear: Secondary | ICD-10-CM | POA: Diagnosis not present

## 2019-04-17 ENCOUNTER — Ambulatory Visit (INDEPENDENT_AMBULATORY_CARE_PROVIDER_SITE_OTHER): Payer: PPO

## 2019-04-17 DIAGNOSIS — R55 Syncope and collapse: Secondary | ICD-10-CM | POA: Diagnosis not present

## 2019-04-27 ENCOUNTER — Inpatient Hospital Stay (HOSPITAL_COMMUNITY)
Admission: EM | Admit: 2019-04-27 | Discharge: 2019-04-29 | DRG: 149 | Disposition: A | Discharge status: Home / Self Care | Payer: PPO | Attending: Internal Medicine | Admitting: Internal Medicine

## 2019-04-27 ENCOUNTER — Other Ambulatory Visit: Payer: Self-pay

## 2019-04-27 ENCOUNTER — Emergency Department (HOSPITAL_COMMUNITY): Payer: PPO

## 2019-04-27 ENCOUNTER — Encounter (HOSPITAL_COMMUNITY): Payer: Self-pay | Admitting: Emergency Medicine

## 2019-04-27 DIAGNOSIS — K219 Gastro-esophageal reflux disease without esophagitis: Secondary | ICD-10-CM | POA: Diagnosis not present

## 2019-04-27 DIAGNOSIS — I491 Atrial premature depolarization: Secondary | ICD-10-CM | POA: Diagnosis not present

## 2019-04-27 DIAGNOSIS — I2510 Atherosclerotic heart disease of native coronary artery without angina pectoris: Secondary | ICD-10-CM | POA: Diagnosis present

## 2019-04-27 DIAGNOSIS — I10 Essential (primary) hypertension: Secondary | ICD-10-CM | POA: Diagnosis present

## 2019-04-27 DIAGNOSIS — Z981 Arthrodesis status: Secondary | ICD-10-CM

## 2019-04-27 DIAGNOSIS — Z87891 Personal history of nicotine dependence: Secondary | ICD-10-CM

## 2019-04-27 DIAGNOSIS — R0602 Shortness of breath: Secondary | ICD-10-CM | POA: Diagnosis not present

## 2019-04-27 DIAGNOSIS — R42 Dizziness and giddiness: Principal | ICD-10-CM

## 2019-04-27 DIAGNOSIS — R809 Proteinuria, unspecified: Secondary | ICD-10-CM | POA: Diagnosis not present

## 2019-04-27 DIAGNOSIS — Z8781 Personal history of (healed) traumatic fracture: Secondary | ICD-10-CM

## 2019-04-27 DIAGNOSIS — G4730 Sleep apnea, unspecified: Secondary | ICD-10-CM | POA: Diagnosis present

## 2019-04-27 DIAGNOSIS — N179 Acute kidney failure, unspecified: Secondary | ICD-10-CM | POA: Diagnosis not present

## 2019-04-27 DIAGNOSIS — E860 Dehydration: Secondary | ICD-10-CM | POA: Diagnosis not present

## 2019-04-27 DIAGNOSIS — Z79899 Other long term (current) drug therapy: Secondary | ICD-10-CM | POA: Diagnosis not present

## 2019-04-27 DIAGNOSIS — R110 Nausea: Secondary | ICD-10-CM | POA: Diagnosis not present

## 2019-04-27 DIAGNOSIS — Z7982 Long term (current) use of aspirin: Secondary | ICD-10-CM

## 2019-04-27 DIAGNOSIS — E785 Hyperlipidemia, unspecified: Secondary | ICD-10-CM | POA: Diagnosis not present

## 2019-04-27 DIAGNOSIS — Z86718 Personal history of other venous thrombosis and embolism: Secondary | ICD-10-CM

## 2019-04-27 DIAGNOSIS — Z87442 Personal history of urinary calculi: Secondary | ICD-10-CM

## 2019-04-27 DIAGNOSIS — R824 Acetonuria: Secondary | ICD-10-CM | POA: Diagnosis present

## 2019-04-27 DIAGNOSIS — Z20828 Contact with and (suspected) exposure to other viral communicable diseases: Secondary | ICD-10-CM | POA: Diagnosis present

## 2019-04-27 DIAGNOSIS — F4310 Post-traumatic stress disorder, unspecified: Secondary | ICD-10-CM | POA: Diagnosis not present

## 2019-04-27 DIAGNOSIS — J45909 Unspecified asthma, uncomplicated: Secondary | ICD-10-CM | POA: Diagnosis present

## 2019-04-27 DIAGNOSIS — Z86010 Personal history of colonic polyps: Secondary | ICD-10-CM | POA: Diagnosis not present

## 2019-04-27 DIAGNOSIS — R52 Pain, unspecified: Secondary | ICD-10-CM | POA: Diagnosis not present

## 2019-04-27 DIAGNOSIS — F419 Anxiety disorder, unspecified: Secondary | ICD-10-CM | POA: Diagnosis present

## 2019-04-27 LAB — URINALYSIS, ROUTINE W REFLEX MICROSCOPIC
Bacteria, UA: NONE SEEN
Bilirubin Urine: NEGATIVE
Glucose, UA: NEGATIVE mg/dL
Hgb urine dipstick: NEGATIVE
Ketones, ur: 5 mg/dL — AB
Leukocytes,Ua: NEGATIVE
Nitrite: NEGATIVE
Protein, ur: 30 mg/dL — AB
Specific Gravity, Urine: 1.024 (ref 1.005–1.030)
pH: 5 (ref 5.0–8.0)

## 2019-04-27 LAB — CBC
HCT: 44.1 % (ref 39.0–52.0)
Hemoglobin: 15.2 g/dL (ref 13.0–17.0)
MCH: 34 pg (ref 26.0–34.0)
MCHC: 34.5 g/dL (ref 30.0–36.0)
MCV: 98.7 fL (ref 80.0–100.0)
Platelets: 317 10*3/uL (ref 150–400)
RBC: 4.47 MIL/uL (ref 4.22–5.81)
RDW: 12.4 % (ref 11.5–15.5)
WBC: 9.8 10*3/uL (ref 4.0–10.5)
nRBC: 0 % (ref 0.0–0.2)

## 2019-04-27 LAB — CBG MONITORING, ED: Glucose-Capillary: 84 mg/dL (ref 70–99)

## 2019-04-27 LAB — BASIC METABOLIC PANEL
Anion gap: 14 (ref 5–15)
BUN: 19 mg/dL (ref 8–23)
CO2: 21 mmol/L — ABNORMAL LOW (ref 22–32)
Calcium: 9.6 mg/dL (ref 8.9–10.3)
Chloride: 104 mmol/L (ref 98–111)
Creatinine, Ser: 1.73 mg/dL — ABNORMAL HIGH (ref 0.61–1.24)
GFR calc Af Amer: 45 mL/min — ABNORMAL LOW (ref >60)
GFR calc non Af Amer: 39 mL/min — ABNORMAL LOW (ref >60)
Glucose, Bld: 108 mg/dL — ABNORMAL HIGH (ref 70–99)
Potassium: 3.5 mmol/L (ref 3.5–5.1)
Sodium: 139 mmol/L (ref 135–145)

## 2019-04-27 MED ORDER — MECLIZINE HCL 25 MG PO TABS
25.0000 mg | ORAL_TABLET | Freq: Once | ORAL | Status: AC
  Administered 2019-04-28: 25 mg via ORAL
  Filled 2019-04-27: qty 1

## 2019-04-27 MED ORDER — SODIUM CHLORIDE 0.9 % IV BOLUS (SEPSIS)
1000.0000 mL | Freq: Once | INTRAVENOUS | Status: AC
  Administered 2019-04-27: 1000 mL via INTRAVENOUS

## 2019-04-27 MED ORDER — ONDANSETRON HCL 4 MG/2ML IJ SOLN
4.0000 mg | Freq: Once | INTRAMUSCULAR | Status: AC
  Administered 2019-04-27: 4 mg via INTRAVENOUS
  Filled 2019-04-27: qty 2

## 2019-04-27 MED ORDER — SODIUM CHLORIDE 0.9% FLUSH: 3.0000 mL | Freq: Once | INTRAVENOUS | Status: DC

## 2019-04-27 NOTE — ED Notes (Signed)
Pt says his 'vertigo is kicking in' grabs back of head and moans in apparent pain. Vitals taken wnl. RN Santiago Glad notified.

## 2019-04-27 NOTE — ED Triage Notes (Signed)
Pt here with ongoing vertigo for last 6 weeks. Here for further evaluation of same via EMS. 12 lead EKG NSR, cbg 106, 18g LAC, 4mg  zofran PTA. NAD at present.

## 2019-04-27 NOTE — ED Provider Notes (Addendum)
TIME SEEN: 11:40 PM  CHIEF COMPLAINT: Vertigo  HPI: Patient is a 73 year old male with history of hypertension, hyperlipidemia, peripheral neuropathy, nonobstructive CAD who presents to the emergency department with vertigo.  States he has had intermittent vertigo symptoms for many years.  Symptoms have been worse and more persistent over the past 6 weeks.  He states he has been seen by [facility] neurology, cardiology and was being seen today at Dr. Deeann Saint office with ENT when he had an episode.  States that Dr. Benjamine Mola recommended he come to the emergency department.  Patient states "I need to know what is going on".  He states that during these episodes he will feel a flushed feeling from his waist to his head and the room will begin to spin and he will become nauseated.  He states that at times he will have headache, chest pressure and shortness of breath.  He has had a cardiac catheterization and echocardiogram in February that showed nonobstructive disease with normal ejection fraction.  Recently just wore a Holter monitor and is awaiting these results from his cardiologist.  Last MRI of the brain was in 2016 which showed no acute abnormality.  No symptoms currently other than mild vertigo.  No numbness, focal weakness.  Cath 08/17/2018:   Ost Cx to Prox Cx lesion is 20% stenosed.  Dist LM to Ost LAD lesion is 50% stenosed.  Prox LAD lesion is 30% stenosed.  Prox LAD to Mid LAD lesion is 20% stenosed.  The left ventricular systolic function is normal.  LV end diastolic pressure is normal.  The left ventricular ejection fraction is 45-50% by visual estimate.  There is no mitral valve regurgitation.   1. Moderate (40-50%) ostial LAD stenosis. This is a smooth lesion and does not appear to be flow limiting.  2. Mild non-obstructive disease in the proximal Circumflex artery 3. Large dominant RCA with no obvious plaque.  4. Mild segmental LV systolic dysfunction, [id number]. Apical  hypokinesis.   Recommendations: Medical management of CAD. I do not the in the smooth, moderate narrowing of the ostial/proximal LAD is causing his chest pain. Echo in am to better assess LV systolic function.    Echo 08/18/2018:  IMPRESSIONS    1. The left ventricle has normal systolic function with an ejection fraction of 60-65%. The cavity size was mildly dilated. Left ventricular diastolic parameters were normal.  2. The mitral valve is normal in structure.  3. The tricuspid valve is normal in structure.  4. The aortic valve is tricuspid Mild calcification of the aortic valve. Aortic valve regurgitation is mild by color flow Doppler.  5. The pulmonic valve was normal in structure.  6. There is mild dilatation of the aortic root measuring 40 mm.  ROS: See HPI Constitutional: no fever  Eyes: no drainage  ENT: no runny nose   Cardiovascular:  no chest pain  Resp: no SOB  GI: no vomiting GU: no dysuria Integumentary: no rash  Allergy: no hives  Musculoskeletal: no leg swelling  Neurological: no slurred speech ROS otherwise negative  PAST MEDICAL HISTORY/PAST SURGICAL HISTORY:  Past Medical History:  Diagnosis Date  . Adenomatous colon polyp   . Anxiety   . Arthritis   . Asthma   . Complication of anesthesia    slow to come back awake- per patient/ and makes him sick per wife  . DVT (deep venous thrombosis) ([place]) 2008  . Essential hypertension   . GERD (gastroesophageal reflux disease)   . History of  kidney stones   . Hyperlipidemia   . Internal hemorrhoids   . Peripheral neuropathy   . Post traumatic stress disorder (PTSD)   . Sleep apnea   . Vertigo     MEDICATIONS:  Prior to Admission medications   Medication Sig Start Date End Date Taking? Authorizing Provider  acetaminophen (TYLENOL) 500 MG tablet Take 500 mg by mouth every 6 (six) hours as needed for fever.    [provider]  amLODipine (NORVASC) 5 MG tablet Take 1 tablet (5 mg total) by mouth  daily. 01/31/19 05/01/19  Imogene Burn, PA-C  Artificial Tear Ointment (DRY EYES OP) Apply 1 application to eye as needed (dry eyes).     [provider]  aspirin EC 81 MG EC tablet Take 1 tablet (81 mg total) by mouth daily. 08/19/18   Kathyrn Drown D, NP  Biotin (BIOTIN 5000) 5 MG CAPS Take 10 mg by mouth daily.     [provider]  fluorometholone (FML) 0.1 % ophthalmic suspension Place 1 drop into both eyes 3 (three) times daily.  03/29/19   [provider]  losartan (COZAAR) 50 MG tablet Take 1 tablet (50 mg total) by mouth 2 (two) times a day. 01/10/19 04/12/19  Imogene Burn, PA-C  mirtazapine (REMERON) 45 MG tablet Take 45 mg by mouth at bedtime.    [provider]  nitroGLYCERIN (NITROSTAT) 0.4 MG SL tablet Place 1 tablet (0.4 mg total) under the tongue every 5 (five) minutes x 3 doses as needed for chest pain. 08/18/18   Kathyrn Drown D, NP  omeprazole (PRILOSEC) 20 MG capsule Take 20 mg by mouth every morning.     [provider]  ondansetron (ZOFRAN ODT) 4 MG disintegrating tablet 4mg  ODT q4 hours prn nausea/vomit Patient taking differently: Take 4 mg by mouth every 4 (four) hours as needed for nausea or vomiting.  07/31/18   Milton Ferguson, MD  psyllium (METAMUCIL) 58.6 % powder Take 1 packet by mouth daily.     [provider]  rosuvastatin (CRESTOR) 20 MG tablet Take 1 tablet (20 mg total) by mouth daily at 6 PM. 02/25/19   Satira Sark, MD    ALLERGIES:  Allergies  Allergen Reactions  . Other Nausea And Vomiting and Other (See Comments)    "STRONG" PAIN MEDICATIONS IN GENERAL: HEADACHE  . Tamsulosin Anaphylaxis  . Codeine Nausea And Vomiting    REACTION: Headache  . Morphine And Related Nausea And Vomiting  . Pentazocine Nausea And Vomiting and Other (See Comments)    Headaches  . Metoprolol Itching  . Sertraline Swelling    Swelling in knee 100 mg  . Topiramate Other (See Comments)    Headache    SOCIAL  HISTORY:  Social History   Tobacco Use  . Smoking status: Former Smoker    Types: Cigars  . Smokeless tobacco: Never Used  . Tobacco comment: quit 15 years ago (2017) never smoked alot  Substance Use Topics  . Alcohol use: No    FAMILY HISTORY: Family History  Problem Relation Age of Onset  . Hypertension Father   . Hyperlipidemia Father   . Lung cancer Father        from piece of abestos lodged in his lung  . Thyroid cancer Mother   . Colon cancer Brother 104  . Bladder Cancer Brother     EXAM: BP 139/86 (BP Location: Right Arm)   Pulse 60   Temp 98.4 F (36.9 C) (Oral)  Resp 16   SpO2 98%  CONSTITUTIONAL: Alert and oriented and responds appropriately to questions. Well-appearing; well-nourished HEAD: Normocephalic EYES: Conjunctivae clear, pupils appear equal, EOMI ENT: normal nose; moist mucous membranes NECK: Supple, no meningismus, no nuchal rigidity, no LAD  CARD: RRR; S1 and S2 appreciated; no murmurs, no clicks, no rubs, no gallops RESP: Normal chest excursion without splinting or tachypnea; breath sounds clear and equal bilaterally; no wheezes, no rhonchi, no rales, no hypoxia or respiratory distress, speaking full sentences ABD/GI: Normal bowel sounds; non-distended; soft, non-tender, no rebound, no guarding, no peritoneal signs, no hepatosplenomegaly BACK:  The back appears normal and is non-tender to palpation, there is no CVA tenderness EXT: Normal ROM in all joints; non-tender to palpation; no edema; normal capillary refill; no cyanosis, no calf tenderness or swelling    SKIN: Normal color for age and race; warm; no rash NEURO: Moves all extremities equally, strength 5/5 in all 4 extremities, cranial nerves II through XII intact, normal speech, no ataxia PSYCH: The patient's mood and manner are appropriate. Grooming and personal hygiene are appropriate.  MEDICAL DECISION MAKING: Patient here with exacerbation of his chronic vertigo.  Also had episode of  chest pressure and shortness of breath.  Labs obtained in triage unremarkable other than AKI.  Will check orthostatic vital signs.  Will obtain EKG, troponin.  Will treat symptoms with meclizine, Zofran, IV fluids.  He has no current focal neurologic deficits.  Patient states that he was told to come the emergency department for admission to the hospital.    Doubt ACS, PE, dissection.  Doubt stroke.  ED PROGRESS: 11:50 PM  Discussed with Dr. Benjamine Mola with ENT who saw patient in the office today.  He states that he recommended patient come to the emergency department for IV medications because he was extremely symptomatic, yelling, kicking while in his office today.  There were not any specific tests that Dr. Benjamine Mola wanted ordered at this time.  Appreciate ENT help.  We will reevaluate patient after IV medications.  Low suspicion for stroke.  He is neurologically intact without ataxia on exam.  He has had neurologic work-up in the past has been unremarkable.  He has also had significant cardiac work-up as an outpatient and is being followed by ENT.  1:15 AM  Patient states symptoms are better but not completely resolved.  He is able to ambulate without ataxia but does so very cautiously.  He states he is very concerned about going home because he feels this was the worst episode that he ever had and symptoms have become more constant over the past few weeks.  He would like admission to the hospital.  Discussed with patient his goals of care.  He states he would like to figure out what is causing the symptoms.  Have had lengthy discussion with him multiple times at this likely will not be resolved during this hospitalization.  He does have AKI today.  His creatinine is 1.73 with a GFR of 39 and was previously 1.07 with a GFR greater than 60 on 04/08/19.  Why do not think that this is the cause of his symptoms today I do think that this could be exacerbating his symptoms.  Discussed with patient that I could discuss  with the hospitalist for observation admission for IV hydration.  He states he would prefer this over than going home especially given he still feels symptomatic.  We will continue to hydrate patient.  Orthostats negative today.  1:29 AM Discussed  patient's case with hospitalist, Dr. Myna Hidalgo.  I have recommended admission and patient (and family if present) agree with this plan. Admitting physician will place admission orders.   Hospitalist agrees for observation admission for continued IV hydration.  Also likely order repeat MRI of the brain since he has not had one since 2016.  We will have OT see the patient as well while in the hospital.  Will order Covid testing.  I reviewed all nursing notes, vitals, pertinent previous records and interpreted all EKGs, lab and urine results, imaging (as available).    EKG Interpretation  Date/Time:  Wednesday April 27 2019 15:16:26 EST Ventricular Rate:  73 PR Interval:  160 QRS Duration: 98 QT Interval:  382 QTC Calculation: 420 R Axis:   26 Text Interpretation: Sinus rhythm with occasional Premature ventricular complexes Otherwise normal ECG No significant change since last tracing Confirmed by Pryor Curia [phone]) on 04/27/2019 11:53:11 PM        Raeford Razor was evaluated in Emergency Department on 04/27/2019 for the symptoms described in the history of present illness. He was evaluated in the context of the global COVID-19 pandemic, which necessitated consideration that the patient might be at risk for infection with the SARS-CoV-2 virus that causes COVID-19. Institutional protocols and algorithms that pertain to the evaluation of patients at risk for COVID-19 are in a state of rapid change based on information released by regulatory bodies including the CDC and federal and state organizations. These policies and algorithms were followed during the patient's care in the ED.     Raidyn Breiner, Delice Bison, DO 04/28/19 0129    Shaquandra Galano, Delice Bison,  DO 04/28/19 [id number]

## 2019-04-28 ENCOUNTER — Observation Stay (HOSPITAL_COMMUNITY): Payer: PPO

## 2019-04-28 ENCOUNTER — Other Ambulatory Visit: Payer: Self-pay

## 2019-04-28 ENCOUNTER — Encounter (HOSPITAL_COMMUNITY): Payer: Self-pay | Admitting: Family Medicine

## 2019-04-28 DIAGNOSIS — Z20828 Contact with and (suspected) exposure to other viral communicable diseases: Secondary | ICD-10-CM | POA: Diagnosis present

## 2019-04-28 DIAGNOSIS — F4310 Post-traumatic stress disorder, unspecified: Secondary | ICD-10-CM | POA: Diagnosis present

## 2019-04-28 DIAGNOSIS — Z87442 Personal history of urinary calculi: Secondary | ICD-10-CM | POA: Diagnosis not present

## 2019-04-28 DIAGNOSIS — E785 Hyperlipidemia, unspecified: Secondary | ICD-10-CM | POA: Diagnosis present

## 2019-04-28 DIAGNOSIS — R809 Proteinuria, unspecified: Secondary | ICD-10-CM | POA: Diagnosis present

## 2019-04-28 DIAGNOSIS — Z86718 Personal history of other venous thrombosis and embolism: Secondary | ICD-10-CM | POA: Diagnosis not present

## 2019-04-28 DIAGNOSIS — I10 Essential (primary) hypertension: Secondary | ICD-10-CM | POA: Diagnosis present

## 2019-04-28 DIAGNOSIS — R42 Dizziness and giddiness: Principal | ICD-10-CM

## 2019-04-28 DIAGNOSIS — E860 Dehydration: Secondary | ICD-10-CM | POA: Diagnosis present

## 2019-04-28 DIAGNOSIS — Z8781 Personal history of (healed) traumatic fracture: Secondary | ICD-10-CM | POA: Diagnosis not present

## 2019-04-28 DIAGNOSIS — Z79899 Other long term (current) drug therapy: Secondary | ICD-10-CM | POA: Diagnosis not present

## 2019-04-28 DIAGNOSIS — I2510 Atherosclerotic heart disease of native coronary artery without angina pectoris: Secondary | ICD-10-CM | POA: Diagnosis present

## 2019-04-28 DIAGNOSIS — Z86010 Personal history of colonic polyps: Secondary | ICD-10-CM | POA: Diagnosis not present

## 2019-04-28 DIAGNOSIS — N179 Acute kidney failure, unspecified: Secondary | ICD-10-CM

## 2019-04-28 DIAGNOSIS — K219 Gastro-esophageal reflux disease without esophagitis: Secondary | ICD-10-CM | POA: Diagnosis present

## 2019-04-28 DIAGNOSIS — G4730 Sleep apnea, unspecified: Secondary | ICD-10-CM | POA: Diagnosis present

## 2019-04-28 DIAGNOSIS — F419 Anxiety disorder, unspecified: Secondary | ICD-10-CM | POA: Diagnosis present

## 2019-04-28 DIAGNOSIS — Z7982 Long term (current) use of aspirin: Secondary | ICD-10-CM | POA: Diagnosis not present

## 2019-04-28 DIAGNOSIS — J45909 Unspecified asthma, uncomplicated: Secondary | ICD-10-CM | POA: Diagnosis present

## 2019-04-28 DIAGNOSIS — Z981 Arthrodesis status: Secondary | ICD-10-CM | POA: Diagnosis not present

## 2019-04-28 DIAGNOSIS — Z87891 Personal history of nicotine dependence: Secondary | ICD-10-CM | POA: Diagnosis not present

## 2019-04-28 DIAGNOSIS — R824 Acetonuria: Secondary | ICD-10-CM | POA: Diagnosis present

## 2019-04-28 LAB — BASIC METABOLIC PANEL
Anion gap: 8 (ref 5–15)
BUN: 17 mg/dL (ref 8–23)
CO2: 24 mmol/L (ref 22–32)
Calcium: 9 mg/dL (ref 8.9–10.3)
Chloride: 107 mmol/L (ref 98–111)
Creatinine, Ser: 1.53 mg/dL — ABNORMAL HIGH (ref 0.61–1.24)
GFR calc Af Amer: 52 mL/min — ABNORMAL LOW (ref >60)
GFR calc non Af Amer: 45 mL/min — ABNORMAL LOW (ref >60)
Glucose, Bld: 102 mg/dL — ABNORMAL HIGH (ref 70–99)
Potassium: 4.2 mmol/L (ref 3.5–5.1)
Sodium: 139 mmol/L (ref 135–145)

## 2019-04-28 LAB — CBC
HCT: 43.3 % (ref 39.0–52.0)
Hemoglobin: 14.5 g/dL (ref 13.0–17.0)
MCH: 33.5 pg (ref 26.0–34.0)
MCHC: 33.5 g/dL (ref 30.0–36.0)
MCV: 100 fL (ref 80.0–100.0)
Platelets: 294 10*3/uL (ref 150–400)
RBC: 4.33 MIL/uL (ref 4.22–5.81)
RDW: 12.7 % (ref 11.5–15.5)
WBC: 8.4 10*3/uL (ref 4.0–10.5)
nRBC: 0 % (ref 0.0–0.2)

## 2019-04-28 LAB — CREATININE, URINE, RANDOM: Creatinine, Urine: 316.65 mg/dL

## 2019-04-28 LAB — TROPONIN I (HIGH SENSITIVITY): Troponin I (High Sensitivity): 5 ng/L (ref <18)

## 2019-04-28 LAB — SODIUM, URINE, RANDOM: Sodium, Ur: 11 mmol/L

## 2019-04-28 LAB — SARS CORONAVIRUS 2 (TAT 6-24 HRS): SARS Coronavirus 2: NEGATIVE

## 2019-04-28 MED ORDER — MECLIZINE HCL 25 MG PO TABS
25.0000 mg | ORAL_TABLET | Freq: Three times a day (TID) | ORAL | Status: DC
  Administered 2019-04-28 – 2019-04-29 (×3): 25 mg via ORAL
  Filled 2019-04-28 (×5): qty 1

## 2019-04-28 MED ORDER — ONDANSETRON HCL 4 MG/2ML IJ SOLN
4.0000 mg | Freq: Four times a day (QID) | INTRAMUSCULAR | Status: DC | PRN
  Administered 2019-04-28: 4 mg via INTRAVENOUS
  Filled 2019-04-28 (×2): qty 2

## 2019-04-28 MED ORDER — SODIUM CHLORIDE 0.9 % IV SOLN
INTRAVENOUS | Status: AC
  Administered 2019-04-28: 125 mL/h via INTRAVENOUS

## 2019-04-28 MED ORDER — ROSUVASTATIN CALCIUM 20 MG PO TABS
20.0000 mg | ORAL_TABLET | Freq: Every day | ORAL | Status: DC
  Administered 2019-04-28: 20 mg via ORAL
  Filled 2019-04-28 (×2): qty 1

## 2019-04-28 MED ORDER — ACETAMINOPHEN 325 MG PO TABS: 650.0000 mg | ORAL_TABLET | Freq: Four times a day (QID) | ORAL | Status: DC | PRN

## 2019-04-28 MED ORDER — MECLIZINE HCL 25 MG PO TABS
25.0000 mg | ORAL_TABLET | Freq: Three times a day (TID) | ORAL | Status: DC | PRN
  Administered 2019-04-28: 25 mg via ORAL
  Filled 2019-04-28 (×2): qty 1

## 2019-04-28 MED ORDER — ONDANSETRON HCL 4 MG PO TABS
4.0000 mg | ORAL_TABLET | Freq: Four times a day (QID) | ORAL | Status: DC | PRN
  Administered 2019-04-28 (×2): 4 mg via ORAL
  Filled 2019-04-28: qty 1

## 2019-04-28 MED ORDER — SODIUM CHLORIDE 0.9 % IV SOLN: INTRAVENOUS | Status: DC

## 2019-04-28 MED ORDER — HYDROCODONE-ACETAMINOPHEN 5-325 MG PO TABS: 1.0000 | ORAL_TABLET | ORAL | Status: DC | PRN

## 2019-04-28 MED ORDER — PANTOPRAZOLE SODIUM 40 MG PO TBEC
40.0000 mg | DELAYED_RELEASE_TABLET | Freq: Every day | ORAL | Status: DC
  Administered 2019-04-28 – 2019-04-29 (×2): 40 mg via ORAL
  Filled 2019-04-28 (×2): qty 1

## 2019-04-28 MED ORDER — ACETAMINOPHEN 650 MG RE SUPP: 650.0000 mg | Freq: Four times a day (QID) | RECTAL | Status: DC | PRN

## 2019-04-28 MED ORDER — SODIUM CHLORIDE 0.9 % IV SOLN: INTRAVENOUS | Status: AC

## 2019-04-28 MED ORDER — POLYETHYLENE GLYCOL 3350 17 G PO PACK: 17.0000 g | PACK | Freq: Every day | ORAL | Status: DC | PRN

## 2019-04-28 MED ORDER — FLUOROMETHOLONE 0.1 % OP SUSP
1.0000 [drp] | Freq: Three times a day (TID) | OPHTHALMIC | Status: DC
  Administered 2019-04-28 – 2019-04-29 (×2): 1 [drp] via OPHTHALMIC
  Filled 2019-04-28: qty 5

## 2019-04-28 MED ORDER — MIRTAZAPINE 15 MG PO TABS
45.0000 mg | ORAL_TABLET | Freq: Every day | ORAL | Status: DC
  Administered 2019-04-28: 45 mg via ORAL
  Filled 2019-04-28: qty 1
  Filled 2019-04-28: qty 3

## 2019-04-28 MED ORDER — ASPIRIN EC 81 MG PO TBEC
81.0000 mg | DELAYED_RELEASE_TABLET | Freq: Every day | ORAL | Status: DC
  Administered 2019-04-28 – 2019-04-29 (×2): 81 mg via ORAL
  Filled 2019-04-28 (×2): qty 1

## 2019-04-28 MED ORDER — DIAZEPAM 2 MG PO TABS
2.0000 mg | ORAL_TABLET | Freq: Four times a day (QID) | ORAL | Status: DC | PRN
  Administered 2019-04-28: 2 mg via ORAL
  Filled 2019-04-28: qty 1

## 2019-04-28 MED ORDER — SODIUM CHLORIDE 0.9% FLUSH
3.0000 mL | Freq: Two times a day (BID) | INTRAVENOUS | Status: DC
  Administered 2019-04-28 – 2019-04-29 (×2): 3 mL via INTRAVENOUS

## 2019-04-28 MED ORDER — AMLODIPINE BESYLATE 5 MG PO TABS
5.0000 mg | ORAL_TABLET | Freq: Every day | ORAL | Status: DC
  Administered 2019-04-28 – 2019-04-29 (×2): 5 mg via ORAL
  Filled 2019-04-28 (×2): qty 1
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
## 2019-04-28 NOTE — Progress Notes (Signed)
Seen and examined-admitted earlier this morning by Dr. Myna Hidalgo.    73 year old male with CAD, HTN with chronic vertigo-worsening recently-with almost daily episodes-associated with nausea, vomiting.  On exam: Lying comfortably in bed No nystagmus No focal neurological deficits  MRI brain: No spine infarct.  Impression: AKI-likely secondary to dehydration/vomiting ?  BPPV-but not positional  Plan: Vestibular PT eval Creatinine improving-but not yet at baseline-continue IV fluids  Rest as outlined by Dr. Myna Hidalgo.

## 2019-04-28 NOTE — Progress Notes (Signed)
OT Cancellation Note  Patient Details Name: Andrew Bates MRN: [id number] DOB: 09-07-1945   Cancelled Treatment:    Reason Eval/Treat Not Completed: OT screened, no needs identified, will sign off.  Spoke with pt who reports he is able to perform ADLs mod I.  He reports that there is not a trigger to onset of symptoms when they occur, and that he has not experienced any falls.  PT performed vestibular eval.     Lucille Passy, OTR/L [place] Pager [phone] Office [phone]   Lucille Passy M 04/28/2019, 3:27 PM

## 2019-04-28 NOTE — ED Notes (Signed)
OT at bedside. 

## 2019-04-28 NOTE — ED Notes (Signed)
Patient transported to MRI 

## 2019-04-28 NOTE — H&P (Signed)
History and Physical    Andrew Bates [id number] DOB: September 26, 1945 DOA: 04/27/2019  PCP: Sharilyn Sites, MD   Patient coming from: Home   Chief Complaint: Headache, room spinning, N/V, vision disturbance  HPI: Andrew Bates is a 73 y.o. male with medical history significant for coronary artery disease and hypertension, now presenting to emergency department for evaluation of vertigo.  Patient reports that he had vertigo roughly 15 years ago with occasional episodes since then, was having more frequent episodes over the past 6 weeks or so, and now presents for evaluation of similar episodes that are much more severe than what he had experienced previously, and also with transient visual disturbances that are new.  Patient describes recurrent episodes, occurring both at rest and with activity, and marked by acute onset sensation as though the room is spinning, nausea, nonbloody vomiting, occipital headache, and transient visual disturbances that he believes involve both eyes.  He reports undergoing MRI brain previously for vertigo, but states that was roughly 5 years ago now.  He has been having multiple episodes daily the last few days.  He feels as though he does not quite return to his usual self between the episodes for the past couple days.  He denies any fevers or chills, denies recent trauma or fall, and does not have any neck stiffness or confusion.  He has tried over-the-counter medications for this, but with no relief.  He saw ENT in the clinic today for evaluation of the symptoms but had a severe episode there in the office and was directed to the ED.  ED Course: Upon arrival to the ED, patient is found to be afebrile, saturating well on room air, slightly bradycardic, and with stable blood pressure.  EKG features sinus rhythm with PVCs.  Chest x-ray is negative for acute cardiopulmonary disease.  CBC is unremarkable.  Chemistry panel notable for creatinine 1.73, up from 1.07 last month.   Urinalysis with mild ketonuria and proteinuria.  Patient was given a liter of normal saline, Zofran, and meclizine in the emergency department.   Review of Systems:  All other systems reviewed and apart from HPI, are negative.  Past Medical History:  Diagnosis Date  . Adenomatous colon polyp   . Anxiety   . Arthritis   . Asthma   . Complication of anesthesia    slow to come back awake- per patient/ and makes him sick per wife  . DVT (deep venous thrombosis) ([facility]) 2008  . Essential hypertension   . GERD (gastroesophageal reflux disease)   . History of kidney stones   . Hyperlipidemia   . Internal hemorrhoids   . Peripheral neuropathy   . Post traumatic stress disorder (PTSD)   . Sleep apnea   . Vertigo     Past Surgical History:  Procedure Laterality Date  . ANTERIOR CERVICAL DECOMP/DISCECTOMY FUSION N/A 07/09/2015   Procedure: ANTERIOR CERVICAL DECOMPRESSION/DISCECTOMY FUSION CERVICAL FIVE-SIX,CERVICAL SIX-SEVEN;  Surgeon: Newman Pies, MD;  Location: [facility];  Service: Neurosurgery;  Laterality: N/A;  . CARPAL TUNNEL RELEASE Bilateral 2014  . COLONOSCOPY    . CYSTOSCOPY/RETROGRADE/URETEROSCOPY/STONE EXTRACTION WITH BASKET Left 02/14/2014   Procedure: CYSTOSCOPY AND LEFT DOUBLE J STENT PLACEMENT;  ATTEMPTED STONE  EXTRACTION ;  Surgeon: Jorja Loa, MD;  Location: [facility];  Service: Urology;  Laterality: Left;  . ESOPHAGOGASTRODUODENOSCOPY ENDOSCOPY    . KNEE SURGERY Right 2006   Arthroscopy - MCl tear  . LEFT HEART CATH AND CORONARY ANGIOGRAPHY N/A 08/17/2018  Procedure: LEFT HEART CATH AND CORONARY ANGIOGRAPHY;  Surgeon: Burnell Blanks, MD;  Location: [facility];  Service: Cardiovascular;  Laterality: N/A;     reports that he has quit smoking. His smoking use included cigars. He has never used smokeless tobacco. He reports that he does not drink alcohol or use drugs.  Allergies  Allergen Reactions  . Other Nausea And Vomiting and Other (See  Comments)    "STRONG" PAIN MEDICATIONS IN GENERAL: HEADACHE  . Tamsulosin Anaphylaxis  . Codeine Nausea And Vomiting    REACTION: Headache  . Morphine And Related Nausea And Vomiting  . Pentazocine Nausea And Vomiting and Other (See Comments)    Headaches  . Metoprolol Itching  . Sertraline Swelling    Swelling in knee 100 mg  . Topiramate Other (See Comments)    Headache    Family History  Problem Relation Age of Onset  . Hypertension Father   . Hyperlipidemia Father   . Lung cancer Father        from piece of abestos lodged in his lung  . Thyroid cancer Mother   . Colon cancer Brother 47  . Bladder Cancer Brother      Prior to Admission medications   Medication Sig Start Date End Date Taking? Authorizing Provider  acetaminophen (TYLENOL) 500 MG tablet Take 500 mg by mouth every 6 (six) hours as needed for fever.    [provider]  amLODipine (NORVASC) 5 MG tablet Take 1 tablet (5 mg total) by mouth daily. 01/31/19 05/01/19  Imogene Burn, PA-C  Artificial Tear Ointment (DRY EYES OP) Apply 1 application to eye as needed (dry eyes).     [provider]  aspirin EC 81 MG EC tablet Take 1 tablet (81 mg total) by mouth daily. 08/19/18   Kathyrn Drown D, NP  Biotin (BIOTIN 5000) 5 MG CAPS Take 10 mg by mouth daily.     [provider]  fluorometholone (FML) 0.1 % ophthalmic suspension Place 1 drop into both eyes 3 (three) times daily.  03/29/19   [provider]  losartan (COZAAR) 50 MG tablet Take 1 tablet (50 mg total) by mouth 2 (two) times a day. 01/10/19 04/12/19  Imogene Burn, PA-C  mirtazapine (REMERON) 45 MG tablet Take 45 mg by mouth at bedtime.    [provider]  nitroGLYCERIN (NITROSTAT) 0.4 MG SL tablet Place 1 tablet (0.4 mg total) under the tongue every 5 (five) minutes x 3 doses as needed for chest pain. 08/18/18   Kathyrn Drown D, NP  omeprazole (PRILOSEC) 20 MG capsule Take 20 mg by mouth every morning.     [provider]  ondansetron (ZOFRAN ODT) 4 MG disintegrating tablet 4mg  ODT q4 hours prn nausea/vomit Patient taking differently: Take 4 mg by mouth every 4 (four) hours as needed for nausea or vomiting.  07/31/18   Milton Ferguson, MD  psyllium (METAMUCIL) 58.6 % powder Take 1 packet by mouth daily.     [provider]  rosuvastatin (CRESTOR) 20 MG tablet Take 1 tablet (20 mg total) by mouth daily at 6 PM. 02/25/19   Satira Sark, MD    Physical Exam: Vitals:   04/27/19 1512 04/27/19 1625 04/27/19 1935 04/28/19 0000  BP: (!) 124/92 (!) 148/90 139/86 135/70  Pulse: 74 87 60 (!) 54  Resp: 16 (!) 22 16 16   Temp: 98.4 F (36.9 C)     TempSrc: Oral     SpO2: 100%  100% 98% 100%    Constitutional: NAD, calm  Eyes: PERTLA, lids and conjunctivae normal ENMT: Mucous membranes are moist. Posterior pharynx clear of any exudate or lesions.   Neck: normal, supple, no masses, no thyromegaly Respiratory:  no wheezing, no crackles. Normal respiratory effort. No accessory muscle use.  Cardiovascular: S1 & S2 heard, regular rate and rhythm. No extremity edema.   Abdomen: No distension, no tenderness, no masses palpated. Bowel sounds normal.  Musculoskeletal: no clubbing / cyanosis. No joint deformity upper and lower extremities.    Skin: no significant rashes, lesions, ulcers. Warm, dry, well-perfused. Neurologic: CN 2-12 grossly intact. Sensation intact. Strength 5/5 in all 4 limbs.  Psychiatric: Alert and oriented to person, place, and situation. Calm, cooperative.     Labs on Admission: I have personally reviewed following labs and imaging studies  CBC: Recent Labs  Lab 04/27/19 1517  WBC 9.8  HGB 15.2  HCT 44.1  MCV 98.7  PLT [id number]   Basic Metabolic Panel: Recent Labs  Lab 04/27/19 1517  NA 139  K 3.5  CL 104  CO2 21*  GLUCOSE 108*  BUN 19  CREATININE 1.73*  CALCIUM 9.6   GFR: CrCl cannot be calculated (Unknown ideal weight.). Liver Function Tests: No results for  input(s): AST, ALT, ALKPHOS, BILITOT, PROT, ALBUMIN in the last 168 hours. No results for input(s): LIPASE, AMYLASE in the last 168 hours. No results for input(s): AMMONIA in the last 168 hours. Coagulation Profile: No results for input(s): INR, PROTIME in the last 168 hours. Cardiac Enzymes: No results for input(s): CKTOTAL, CKMB, CKMBINDEX, TROPONINI in the last 168 hours. BNP (last 3 results) No results for input(s): PROBNP in the last 8760 hours. HbA1C: No results for input(s): HGBA1C in the last 72 hours. CBG: Recent Labs  Lab 04/27/19 2331  GLUCAP 84   Lipid Profile: No results for input(s): CHOL, HDL, LDLCALC, TRIG, CHOLHDL, LDLDIRECT in the last 72 hours. Thyroid Function Tests: No results for input(s): TSH, T4TOTAL, FREET4, T3FREE, THYROIDAB in the last 72 hours. Anemia Panel: No results for input(s): VITAMINB12, FOLATE, FERRITIN, TIBC, IRON, RETICCTPCT in the last 72 hours. Urine analysis:    Component Value Date/Time   COLORURINE YELLOW 04/27/2019 2120   APPEARANCEUR CLEAR 04/27/2019 2120   LABSPEC 1.024 04/27/2019 2120   PHURINE 5.0 04/27/2019 2120   GLUCOSEU NEGATIVE 04/27/2019 2120   GLUCOSEU NEGATIVE 05/02/2014 1529   HGBUR NEGATIVE 04/27/2019 2120   BILIRUBINUR NEGATIVE 04/27/2019 2120   KETONESUR 5 (A) 04/27/2019 2120   PROTEINUR 30 (A) 04/27/2019 2120   UROBILINOGEN 0.2 02/25/2015 1659   NITRITE NEGATIVE 04/27/2019 2120   LEUKOCYTESUR NEGATIVE 04/27/2019 2120   Sepsis Labs: @LABRCNTIP (procalcitonin:4,lacticidven:4) )No results found for this or any previous visit (from the past 240 hour(s)).   Radiological Exams on Admission: Dg Chest Portable 1 View  Result Date: 04/28/2019 CLINICAL DATA:  Shortness of breath EXAM: PORTABLE CHEST 1 VIEW COMPARISON:  04/08/2019 FINDINGS: The heart size and mediastinal contours are within normal limits. Both lungs are clear. The visualized skeletal structures are unremarkable. IMPRESSION: No active disease.  Electronically Signed   By: Constance Holster M.D.   On: 04/28/2019 00:05    EKG: Independently reviewed. Sinus rhythm, PVC's.   Assessment/Plan   1. Vertigo  - Patient reports a long hx of vertigo, but now presents with sxs that are more severe, more frequent, and different in character, also involving transient vision changes now  - He reports improvement after IVF, meclizine, and Zofran in  ED but not back to baseline  - Check MRI brain, consult OT for eval and tx, continue as-needed meclizine and antiemetics, low-dose Valium for refractory sxs    2. Acute kidney injury  - SCr is 1.73 on admission, up from 1.07 three weeks earlier  - Likely prerenal azotemia in setting of N/V, concomitant ARB use  - He was given a liter of NS in ED  - Hold losartan, continue IVF hydration, check FENa, renally-dose medications, and repeat chem panel in am   3. CAD  - No anginal complaints  - Continue aspirin and statin    4. Hypertension   - BP at goal, continue Norvasc, hold losartan in light of increased creatinine      PPE: Mask, face shield  DVT prophylaxis: SCD's  Code Status: Full  Family Communication: Discussed with patient  Consults called: None  Admission status: Observation    Vianne Bulls, MD [facility] Pager [phone]  If 7PM-7AM, please contact night-coverage [email or web address] Password [email or web address]  04/28/2019, 1:38 AM

## 2019-04-28 NOTE — ED Notes (Signed)
Lunch tray at bedside. ?

## 2019-04-29 LAB — BASIC METABOLIC PANEL
Anion gap: 8 (ref 5–15)
BUN: 17 mg/dL (ref 8–23)
CO2: 22 mmol/L (ref 22–32)
Calcium: 8.7 mg/dL — ABNORMAL LOW (ref 8.9–10.3)
Chloride: 111 mmol/L (ref 98–111)
Creatinine, Ser: 1.35 mg/dL — ABNORMAL HIGH (ref 0.61–1.24)
GFR calc Af Amer: >60 mL/min (ref >60)
GFR calc non Af Amer: 52 mL/min — ABNORMAL LOW (ref >60)
Glucose, Bld: 100 mg/dL — ABNORMAL HIGH (ref 70–99)
Potassium: 3.9 mmol/L (ref 3.5–5.1)
Sodium: 141 mmol/L (ref 135–145)

## 2019-04-29 MED ORDER — PREDNISONE 50 MG PO TABS: 50.0000 mg | ORAL_TABLET | Freq: Every day | ORAL | 0 refills | Status: AC

## 2019-04-29 MED ORDER — ONDANSETRON 4 MG PO TBDP: 4.0000 mg | ORAL_TABLET | Freq: Three times a day (TID) | ORAL | 0 refills | Status: AC | PRN
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
## 2019-04-29 NOTE — Patient Instructions (Signed)
Gaze Stabilization: Sitting    Keeping eyes on target on wall 5 feet away, tilt head down 15-30 and move head side to side for __30-60__ seconds. Repeat while moving head up and down for _30-60___ seconds. Do __4__ sessions per day.   Copyright  VHI. All rights reserved.  Gaze Stabilization: Standing Feet Apart    Feet shoulder width apart, keeping eyes on target on wall ___5_ feet away, tilt head down 15-30 and move head side to side for _30-60___ seconds. Repeat while moving head up and down for _30-60___ seconds. Do _4___ sessions per day. Repeat using target on pattern background.  Copyright  VHI. All rights reserved.  Gaze Stabilization: Tip Card  1.Target must remain in focus, not blurry, and appear stationary while head is in motion. 2.Perform exercises with small head movements (45 to either side of midline). 3.Increase speed of head motion so long as target is in focus. 4.If you wear eyeglasses, be sure you can see target through lens (therapist will give specific instructions for bifocal / progressive lenses). 5.These exercises may provoke dizziness or nausea. Work through these symptoms. If too dizzy, slow head movement slightly. Rest between each exercise. 6.Exercises demand concentration; avoid distractions. 7.For safety, perform standing exercises close to a counter, wall, corner, or next to someone.  Copyright  VHI. All rights reserved.  Bending / Picking Up Objects    Sitting, slowly bend head down and pick up object on the floor. Return to upright position. Hold position until symptoms subside. Repeat __5__ times per session. Do _4___ sessions per day.  Copyright  VHI. All rights reserved.  Tip Card  1.The goal of habituation training is to assist in decreasing symptoms of vertigo, dizziness, or nausea provoked by specific head and body motions. 2.These exercises may initially increase symptoms; however, be persistent and work through symptoms. With  repetition and time, the exercises will assist in reducing or eliminating symptoms. 3.Exercises should be stopped and discussed with the therapist if you experience any of the following: - Sudden change or fluctuation in hearing - New onset of ringing in the ears, or increase in current intensity - Any fluid discharge from the ear - Severe pain in neck or back - Extreme nausea  Copyright  VHI. All rights reserved.

## 2019-04-29 NOTE — Discharge Summary (Signed)
Physician Discharge Summary  Andrew Bates [id number] DOB: [birth year] DOA: 04/27/2019  PCP: Sharilyn Sites, MD  Admit date: 04/27/2019 Discharge date: 04/29/2019  Admitted From: Home Disposition: Home  Recommendations for Outpatient Follow-up:  1. Follow up with PCP in 1-2 weeks 2. Please obtain BMP/CBC in one week your next doctors visit.  3. Recommend outpatient follow-up with ENT and neurology 4. 5 days of oral prednisone given. 5. Antiemetic prescription given   Discharge Condition: Stable CODE STATUS: Full Diet recommendation: 2 g salt  Brief/Interim Summary: 73 year old with history of CAD, essential hypertension came to the hospital with ongoing evaluation for vertigo.  Tells me it is episodic and has been going on for many years.  He has seen multiple physicians outpatient.  While he was in outpatient ENT clinic he developed symptoms therefore brought to the ER.  In the hospital his work-up was negative, MRI of the brain was negative.  He was seen by PT/OT who recommended outpatient follow-up. On the day of discharge during my evaluation he complained of similar intermittent symptoms and he has tried multiple medications in the past.  Occasionally he reports of left ear fullness and left jaw discomfort.  At the time of discharge he was asymptomatic and was doing okay and did not have any complaints.  Wife was at the bedside.   Discharge Diagnoses:  Principal Problem:   Vertigo Active Problems:   Hypertension   AKI (acute kidney injury) ([place])   CAD (coronary artery disease)  Vertigo, intermittent -Most of the work-up and patient has been negative including MRI of the brain.  Wonder if anything has to do with ENT and left ear fullness.  No obvious evidence of infection.  I will prescribe him 5 days of oral prednisone and see if this helps him.  Advised to continue meclizine and antiemetics. -Would benefit from outpatient follow-up with ENT and neurology.  Outpatient  PT/OT.  Can consider outpatient EEG -Can also try as needed Valium.  Acute kidney injury secondary to dehydration -Admission creatinine 1.7.  Baseline 1.0.  Improved with IV fluids.  Coronary artery disease -Chest pain-free.  Resume home meds  Essential hypertension -Resume home meds  Consultations:  None  Subjective: During my evaluation he felt okay and did not have any complaints but does tell me off-and-on he has left ear fullness and left jaw pain.  Denies any chest pain especially during symptoms of vertigo.  Discharge Exam: Vitals:   04/28/19 2256 04/29/19 0622  BP: (!) 95/54 (!) 100/58  Pulse: (!) 55 (!) 58  Resp: 17 15  Temp: 98 F (36.7 C) 98.1 F (36.7 C)  SpO2: 96% 96%   Vitals:   04/28/19 1238 04/28/19 1405 04/28/19 2256 04/29/19 0622  BP: (!) 141/97 136/77 (!) 95/54 (!) 100/58  Pulse: 63 72 (!) 55 (!) 58  Resp: 18 18 17 15   Temp:  97.8 F (36.6 C) 98 F (36.7 C) 98.1 F (36.7 C)  TempSrc:  Oral Oral Oral  SpO2: 99%  96% 96%    General: Pt is alert, awake, not in acute distress Cardiovascular: RRR, S1/S2 +, no rubs, no gallops Respiratory: CTA bilaterally, no wheezing, no rhonchi Abdominal: Soft, NT, ND, bowel sounds + Extremities: no edema, no cyanosis  Discharge Instructions  Discharge Instructions    Ambulatory referral to ENT   Complete by: As directed    Dx= Vestibular evalation.   Ambulatory referral to Neurology   Complete by: As directed    Intermittent dizziness.  Allergies as of 04/29/2019      Reactions   Other Nausea And Vomiting, Other (See Comments)   "STRONG" PAIN MEDICATIONS IN GENERAL: HEADACHE   Tamsulosin Anaphylaxis   Codeine Nausea And Vomiting   REACTION: Headache   Morphine And Related Nausea And Vomiting   Pentazocine Nausea And Vomiting, Other (See Comments)   Headaches   Metoprolol Itching   Sertraline Swelling   Swelling in knee 100 mg   Topiramate Other (See Comments)   Headache      Medication  List    TAKE these medications   acetaminophen 500 MG tablet Commonly known as: TYLENOL Take 500 mg by mouth every 6 (six) hours as needed for fever.   amLODipine 5 MG tablet Commonly known as: NORVASC Take 1 tablet (5 mg total) by mouth daily.   aspirin 81 MG EC tablet Take 1 tablet (81 mg total) by mouth daily.   Biotin 5000 5 MG Caps Generic drug: Biotin Take 10 mg by mouth daily.   DRY EYES OP Apply 1 application to eye as needed (dry eyes).   fluorometholone 0.1 % ophthalmic suspension Commonly known as: FML Place 1 drop into both eyes 3 (three) times daily.   losartan 50 MG tablet Commonly known as: COZAAR Take 1 tablet (50 mg total) by mouth 2 (two) times a day.   mirtazapine 45 MG tablet Commonly known as: REMERON Take 45 mg by mouth at bedtime.   nitroGLYCERIN 0.4 MG SL tablet Commonly known as: NITROSTAT Place 1 tablet (0.4 mg total) under the tongue every 5 (five) minutes x 3 doses as needed for chest pain.   omeprazole 20 MG capsule Commonly known as: PRILOSEC Take 20 mg by mouth every morning.   ondansetron 4 MG disintegrating tablet Commonly known as: Zofran ODT 4mg  ODT q4 hours prn nausea/vomit What changed:   how much to take  how to take this  when to take this  reasons to take this  additional instructions   ondansetron 4 MG disintegrating tablet Commonly known as: Zofran ODT Take 1 tablet (4 mg total) by mouth every 8 (eight) hours as needed for nausea or vomiting. What changed: You were already taking a medication with the same name, and this prescription was added. Make sure you understand how and when to take each.   predniSONE 50 MG tablet Commonly known as: DELTASONE Take 1 tablet (50 mg total) by mouth daily with breakfast for 5 days.   psyllium 58.6 % powder Commonly known as: METAMUCIL Take 1 packet by mouth daily.   rosuvastatin 20 MG tablet Commonly known as: CRESTOR Take 1 tablet (20 mg total) by mouth daily at 6  PM.      Follow-up Information    Sharilyn Sites, MD. Schedule an appointment as soon as possible for a visit in 1 week(s).   Specialty: Family Medicine Contact information: [address] [place] [id number] [phone]        Satira Sark, MD .   Specialty: Cardiology Contact information: [place] [address] [phone]          Allergies  Allergen Reactions  . Other Nausea And Vomiting and Other (See Comments)    "STRONG" PAIN MEDICATIONS IN GENERAL: HEADACHE  . Tamsulosin Anaphylaxis  . Codeine Nausea And Vomiting    REACTION: Headache  . Morphine And Related Nausea And Vomiting  . Pentazocine Nausea And Vomiting and Other (See Comments)    Headaches  . Metoprolol Itching  .  Sertraline Swelling    Swelling in knee 100 mg  . Topiramate Other (See Comments)    Headache    You were cared for by a hospitalist during your hospital stay. If you have any questions about your discharge medications or the care you received while you were in the hospital after you are discharged, you can call the unit and asked to speak with the hospitalist on call if the hospitalist that took care of you is not available. Once you are discharged, your primary care physician will handle any further medical issues. Please note that no refills for any discharge medications will be authorized once you are discharged, as it is imperative that you return to your primary care physician (or establish a relationship with a primary care physician if you do not have one) for your aftercare needs so that they can reassess your need for medications and monitor your lab values.   Procedures/Studies: Mr Brain Wo Contrast  Result Date: 04/28/2019 CLINICAL DATA:  Vertigo EXAM: MRI HEAD WITHOUT CONTRAST TECHNIQUE: Multiplanar, multiecho pulse sequences of the brain and surrounding structures were obtained without intravenous contrast. COMPARISON:  02/25/2015 FINDINGS: Brain:  No acute infarction, hemorrhage, hydrocephalus, extra-axial collection or mass lesion. Mild patchy FLAIR hyperintensity in the cerebral white matter attributed to chronic microvascular ischemia given patient age. Stable and unremarkable brain volume. Vascular: Head normal flow voids, including vertebrobasilar Skull and upper cervical spine: Normal marrow signal. Sinuses/Orbits: Negative. Unremarkable appearance of the mastoid and middle ear spaces. IMPRESSION: 1. No acute finding including infarct.  No explanation for vertigo. 2. Mild chronic small vessel disease without progression from 2016. Electronically Signed   By: Monte Fantasia M.D.   On: 04/28/2019 04:41   Dg Chest Portable 1 View  Result Date: 04/28/2019 CLINICAL DATA:  Shortness of breath EXAM: PORTABLE CHEST 1 VIEW COMPARISON:  04/08/2019 FINDINGS: The heart size and mediastinal contours are within normal limits. Both lungs are clear. The visualized skeletal structures are unremarkable. IMPRESSION: No active disease. Electronically Signed   By: Constance Holster M.D.   On: 04/28/2019 00:05   Dg Chest Portable 1 View  Result Date: 04/08/2019 CLINICAL DATA:  Shortness of breath EXAM: PORTABLE CHEST 1 VIEW COMPARISON:  August 21, 2018 FINDINGS: The heart size and mediastinal contours are within normal limits. Both lungs are clear. The visualized skeletal structures are unremarkable. Cervical spine fixation hardware seen. IMPRESSION: No acute cardiopulmonary process. Electronically Signed   By: Prudencio Pair M.D.   On: 04/08/2019 20:34      The results of significant diagnostics from this hospitalization (including imaging, microbiology, ancillary and laboratory) are listed below for reference.     Microbiology: Recent Results (from the past 240 hour(s))  SARS CORONAVIRUS 2 (TAT 6-24 HRS) Nasopharyngeal Nasopharyngeal Swab     Status: None   Collection Time: 04/28/19  1:43 AM   Specimen: Nasopharyngeal Swab  Result Value Ref Range  Status   SARS Coronavirus 2 NEGATIVE NEGATIVE Final    Comment: (NOTE) SARS-CoV-2 target nucleic acids are NOT DETECTED. The SARS-CoV-2 RNA is generally detectable in upper and lower respiratory specimens during the acute phase of infection. Negative results do not preclude SARS-CoV-2 infection, do not rule out co-infections with other pathogens, and should not be used as the sole basis for treatment or other patient management decisions. Negative results must be combined with clinical observations, patient history, and epidemiological information. The expected result is Negative. Fact Sheet for Patients: SugarRoll.be Fact Sheet for Healthcare Providers: https://www.woods-mathews.com/  This test is not yet approved or cleared by the [place] and  has been authorized for detection and/or diagnosis of SARS-CoV-2 by FDA under an Emergency Use Authorization (EUA). This EUA will remain  in effect (meaning this test can be used) for the duration of the COVID-19 declaration under Section 56 4(b)(1) of the Act, 21 U.S.C. section 360bbb-3(b)(1), unless the authorization is terminated or revoked sooner. Performed at [facility], [place] [address], [place], [place] [address]      Labs: BNP (last 3 results) No results for input(s): BNP in the last 8760 hours. Basic Metabolic Panel: Recent Labs  Lab 04/27/19 1517 04/28/19 0850 04/29/19 0551  NA 139 139 141  K 3.5 4.2 3.9  CL 104 107 111  CO2 21* 24 22  GLUCOSE 108* 102* 100*  BUN 19 17 17   CREATININE 1.73* 1.53* 1.35*  CALCIUM 9.6 9.0 8.7*   Liver Function Tests: No results for input(s): AST, ALT, ALKPHOS, BILITOT, PROT, ALBUMIN in the last 168 hours. No results for input(s): LIPASE, AMYLASE in the last 168 hours. No results for input(s): AMMONIA in the last 168 hours. CBC: Recent Labs  Lab 04/27/19 1517 04/28/19 0850  WBC 9.8 8.4  HGB 15.2 14.5  HCT 44.1 43.3  MCV 98.7  100.0  PLT 317 294   Cardiac Enzymes: No results for input(s): CKTOTAL, CKMB, CKMBINDEX, TROPONINI in the last 168 hours. BNP: Invalid input(s): POCBNP CBG: Recent Labs  Lab 04/27/19 2331  GLUCAP 84   D-Dimer No results for input(s): DDIMER in the last 72 hours. Hgb A1c No results for input(s): HGBA1C in the last 72 hours. Lipid Profile No results for input(s): CHOL, HDL, LDLCALC, TRIG, CHOLHDL, LDLDIRECT in the last 72 hours. Thyroid function studies No results for input(s): TSH, T4TOTAL, T3FREE, THYROIDAB in the last 72 hours.  Invalid input(s): FREET3 Anemia work up No results for input(s): VITAMINB12, FOLATE, FERRITIN, TIBC, IRON, RETICCTPCT in the last 72 hours. Urinalysis    Component Value Date/Time   COLORURINE YELLOW 04/27/2019 2120   APPEARANCEUR CLEAR 04/27/2019 2120   LABSPEC 1.024 04/27/2019 2120   PHURINE 5.0 04/27/2019 2120   GLUCOSEU NEGATIVE 04/27/2019 2120   GLUCOSEU NEGATIVE 05/02/2014 1529   HGBUR NEGATIVE 04/27/2019 2120   BILIRUBINUR NEGATIVE 04/27/2019 2120   KETONESUR 5 (A) 04/27/2019 2120   PROTEINUR 30 (A) 04/27/2019 2120   UROBILINOGEN 0.2 02/25/2015 1659   NITRITE NEGATIVE 04/27/2019 2120   LEUKOCYTESUR NEGATIVE 04/27/2019 2120   Sepsis Labs Invalid input(s): PROCALCITONIN,  WBC,  LACTICIDVEN Microbiology Recent Results (from the past 240 hour(s))  SARS CORONAVIRUS 2 (TAT 6-24 HRS) Nasopharyngeal Nasopharyngeal Swab     Status: None   Collection Time: 04/28/19  1:43 AM   Specimen: Nasopharyngeal Swab  Result Value Ref Range Status   SARS Coronavirus 2 NEGATIVE NEGATIVE Final    Comment: (NOTE) SARS-CoV-2 target nucleic acids are NOT DETECTED. The SARS-CoV-2 RNA is generally detectable in upper and lower respiratory specimens during the acute phase of infection. Negative results do not preclude SARS-CoV-2 infection, do not rule out co-infections with other pathogens, and should not be used as the sole basis for treatment or other  patient management decisions. Negative results must be combined with clinical observations, patient history, and epidemiological information. The expected result is Negative. Fact Sheet for Patients: SugarRoll.be Fact Sheet for Healthcare Providers: https://www.woods-mathews.com/ This test is not yet approved or cleared by the [place] FDA and  has been authorized for detection and/or  diagnosis of SARS-CoV-2 by FDA under an Emergency Use Authorization (EUA). This EUA will remain  in effect (meaning this test can be used) for the duration of the COVID-19 declaration under Section 56 4(b)(1) of the Act, 21 U.S.C. section 360bbb-3(b)(1), unless the authorization is terminated or revoked sooner. Performed at [facility], [place] [address] [place], [place] [address]      Time coordinating discharge:  I have spent 35 minutes face to face with the patient and on the ward discussing the patients care, assessment, plan and disposition with other care givers. >50% of the time was devoted counseling the patient about the risks and benefits of treatment/Discharge disposition and coordinating care.   SIGNED:   Damita Lack, MD  [facility] 04/29/2019, 12:23 PM   If 7PM-7AM, please contact night-coverage

## 2019-05-03 DIAGNOSIS — H8102 Meniere's disease, left ear: Secondary | ICD-10-CM | POA: Diagnosis not present

## 2019-05-03 DIAGNOSIS — H903 Sensorineural hearing loss, bilateral: Secondary | ICD-10-CM | POA: Diagnosis not present

## 2019-05-03 DIAGNOSIS — R42 Dizziness and giddiness: Secondary | ICD-10-CM | POA: Diagnosis not present

## 2019-05-09 ENCOUNTER — Telehealth: Payer: Self-pay

## 2019-05-09 ENCOUNTER — Other Ambulatory Visit: Payer: Self-pay

## 2019-05-09 NOTE — Telephone Encounter (Signed)
Patient states he will be having inner ear surgery and wanted to hold on beta blockers, he feels his elevated heart rate is from his inner ear.I asked him to call back with update

## 2019-05-09 NOTE — Telephone Encounter (Signed)
-----   Message from Satira Sark, MD sent at 05/09/2019  8:56 AM EST ----- Results reviewed.  Overall heart rhythm was normal.  He did have brief episodes of very limited SVT and also occasional PVCs with some in a pattern of trigeminy.  It is not clear to me that these particular findings would necessarily have caused the spell that he described at last office visit, but if he had a more prolonged episode of SVT that affected blood pressure, that is possible.  It might be worth considering resumption of beta-blocker to help suppress some of these extra beats.  He had been on Lopressor 25 mg twice daily, consider resuming.

## 2019-05-18 ENCOUNTER — Encounter: Payer: Self-pay | Admitting: Internal Medicine

## 2019-05-24 ENCOUNTER — Encounter: Payer: Self-pay | Admitting: Internal Medicine

## 2019-05-25 ENCOUNTER — Other Ambulatory Visit (HOSPITAL_COMMUNITY)
Admission: RE | Admit: 2019-05-25 | Discharge: 2019-05-25 | Disposition: A | Discharge status: Home / Self Care | Payer: PPO | Source: Ambulatory Visit | Attending: Cardiology | Admitting: Cardiology

## 2019-05-25 DIAGNOSIS — E782 Mixed hyperlipidemia: Secondary | ICD-10-CM | POA: Insufficient documentation

## 2019-05-25 LAB — LIPID PANEL
Cholesterol: 177 mg/dL (ref 0–200)
HDL: 84 mg/dL (ref >40)
LDL Cholesterol: 78 mg/dL (ref 0–99)
Total CHOL/HDL Ratio: 2.1 RATIO
Triglycerides: 73 mg/dL (ref <150)
VLDL: 15 mg/dL (ref 0–40)

## 2019-05-27 ENCOUNTER — Other Ambulatory Visit: Payer: Self-pay | Admitting: Otolaryngology

## 2019-06-09 ENCOUNTER — Other Ambulatory Visit: Payer: Self-pay

## 2019-06-09 ENCOUNTER — Encounter (HOSPITAL_BASED_OUTPATIENT_CLINIC_OR_DEPARTMENT_OTHER): Payer: Self-pay | Admitting: Otolaryngology

## 2019-06-10 ENCOUNTER — Other Ambulatory Visit (HOSPITAL_COMMUNITY): Payer: PPO

## 2019-06-10 ENCOUNTER — Other Ambulatory Visit (HOSPITAL_COMMUNITY)
Admission: RE | Admit: 2019-06-10 | Discharge: 2019-06-10 | Disposition: A | Discharge status: Home / Self Care | Payer: PPO | Source: Ambulatory Visit | Attending: Otolaryngology | Admitting: Otolaryngology

## 2019-06-10 DIAGNOSIS — Z20828 Contact with and (suspected) exposure to other viral communicable diseases: Secondary | ICD-10-CM | POA: Diagnosis not present

## 2019-06-10 DIAGNOSIS — Z01812 Encounter for preprocedural laboratory examination: Secondary | ICD-10-CM | POA: Insufficient documentation

## 2019-06-10 LAB — SARS CORONAVIRUS 2 (TAT 6-24 HRS): SARS Coronavirus 2: NEGATIVE

## 2019-06-14 ENCOUNTER — Emergency Department (HOSPITAL_BASED_OUTPATIENT_CLINIC_OR_DEPARTMENT_OTHER)
Admission: RE | Admit: 2019-06-14 | Discharge: 2019-06-14 | Disposition: A | Discharge status: Home / Self Care | Payer: No Typology Code available for payment source | Attending: Emergency Medicine | Admitting: Emergency Medicine

## 2019-06-14 ENCOUNTER — Ambulatory Visit (HOSPITAL_BASED_OUTPATIENT_CLINIC_OR_DEPARTMENT_OTHER): Payer: No Typology Code available for payment source | Admitting: Anesthesiology

## 2019-06-14 ENCOUNTER — Encounter (HOSPITAL_BASED_OUTPATIENT_CLINIC_OR_DEPARTMENT_OTHER): Payer: Self-pay | Admitting: Otolaryngology

## 2019-06-14 ENCOUNTER — Encounter (HOSPITAL_COMMUNITY)
Admission: RE | Disposition: A | Discharge status: Home / Self Care | Payer: Self-pay | Source: Home / Self Care | Attending: Emergency Medicine

## 2019-06-14 ENCOUNTER — Other Ambulatory Visit: Payer: Self-pay

## 2019-06-14 ENCOUNTER — Emergency Department (HOSPITAL_COMMUNITY): Payer: No Typology Code available for payment source

## 2019-06-14 ENCOUNTER — Emergency Department (HOSPITAL_COMMUNITY)
Admission: EM | Admit: 2019-06-14 | Discharge: 2019-06-14 | Disposition: A | Discharge status: Home / Self Care | Payer: No Typology Code available for payment source | Source: Home / Self Care

## 2019-06-14 DIAGNOSIS — H8102 Meniere's disease, left ear: Secondary | ICD-10-CM | POA: Insufficient documentation

## 2019-06-14 DIAGNOSIS — K219 Gastro-esophageal reflux disease without esophagitis: Secondary | ICD-10-CM | POA: Diagnosis not present

## 2019-06-14 DIAGNOSIS — Z87891 Personal history of nicotine dependence: Secondary | ICD-10-CM | POA: Insufficient documentation

## 2019-06-14 DIAGNOSIS — E785 Hyperlipidemia, unspecified: Secondary | ICD-10-CM | POA: Diagnosis not present

## 2019-06-14 DIAGNOSIS — R42 Dizziness and giddiness: Secondary | ICD-10-CM | POA: Diagnosis not present

## 2019-06-14 DIAGNOSIS — H903 Sensorineural hearing loss, bilateral: Secondary | ICD-10-CM | POA: Diagnosis not present

## 2019-06-14 DIAGNOSIS — I10 Essential (primary) hypertension: Secondary | ICD-10-CM | POA: Insufficient documentation

## 2019-06-14 DIAGNOSIS — I2510 Atherosclerotic heart disease of native coronary artery without angina pectoris: Secondary | ICD-10-CM | POA: Insufficient documentation

## 2019-06-14 DIAGNOSIS — Z79899 Other long term (current) drug therapy: Secondary | ICD-10-CM | POA: Insufficient documentation

## 2019-06-14 DIAGNOSIS — R112 Nausea with vomiting, unspecified: Secondary | ICD-10-CM | POA: Diagnosis not present

## 2019-06-14 DIAGNOSIS — R072 Precordial pain: Secondary | ICD-10-CM | POA: Diagnosis not present

## 2019-06-14 DIAGNOSIS — F419 Anxiety disorder, unspecified: Secondary | ICD-10-CM | POA: Diagnosis not present

## 2019-06-14 DIAGNOSIS — R079 Chest pain, unspecified: Secondary | ICD-10-CM | POA: Diagnosis not present

## 2019-06-14 DIAGNOSIS — R110 Nausea: Secondary | ICD-10-CM

## 2019-06-14 DIAGNOSIS — R0789 Other chest pain: Secondary | ICD-10-CM | POA: Insufficient documentation

## 2019-06-14 HISTORY — DX: Nausea with vomiting, unspecified: R11.2

## 2019-06-14 HISTORY — DX: Atherosclerotic heart disease of native coronary artery without angina pectoris: I25.10

## 2019-06-14 HISTORY — DX: Other specified postprocedural states: R11.2

## 2019-06-14 HISTORY — DX: Other specified postprocedural states: Z98.890

## 2019-06-14 HISTORY — PX: ENDOLYMPHATIC SAC DECOMPRESSION: SHX6529

## 2019-06-14 LAB — BASIC METABOLIC PANEL
Anion gap: 11 (ref 5–15)
BUN: 17 mg/dL (ref 8–23)
CO2: 22 mmol/L (ref 22–32)
Calcium: 8.5 mg/dL — ABNORMAL LOW (ref 8.9–10.3)
Chloride: 104 mmol/L (ref 98–111)
Creatinine, Ser: 1.51 mg/dL — ABNORMAL HIGH (ref 0.61–1.24)
GFR calc Af Amer: 53 mL/min — ABNORMAL LOW (ref >60)
GFR calc non Af Amer: 45 mL/min — ABNORMAL LOW (ref >60)
Glucose, Bld: 138 mg/dL — ABNORMAL HIGH (ref 70–99)
Potassium: 3.7 mmol/L (ref 3.5–5.1)
Sodium: 137 mmol/L (ref 135–145)

## 2019-06-14 LAB — CBC WITH DIFFERENTIAL/PLATELET
Abs Immature Granulocytes: 0.14 10*3/uL — ABNORMAL HIGH (ref 0.00–0.07)
Basophils Absolute: 0 10*3/uL (ref 0.0–0.1)
Basophils Relative: 1 %
Eosinophils Absolute: 0 10*3/uL (ref 0.0–0.5)
Eosinophils Relative: 0 %
HCT: 42.2 % (ref 39.0–52.0)
Hemoglobin: 14.3 g/dL (ref 13.0–17.0)
Immature Granulocytes: 2 %
Lymphocytes Relative: 5 %
Lymphs Abs: 0.4 10*3/uL — ABNORMAL LOW (ref 0.7–4.0)
MCH: 34 pg (ref 26.0–34.0)
MCHC: 33.9 g/dL (ref 30.0–36.0)
MCV: 100.5 fL — ABNORMAL HIGH (ref 80.0–100.0)
Monocytes Absolute: 0.1 10*3/uL (ref 0.1–1.0)
Monocytes Relative: 1 %
Neutro Abs: 7.1 10*3/uL (ref 1.7–7.7)
Neutrophils Relative %: 91 %
Platelets: 202 10*3/uL (ref 150–400)
RBC: 4.2 MIL/uL — ABNORMAL LOW (ref 4.22–5.81)
RDW: 12.8 % (ref 11.5–15.5)
WBC: 7.7 10*3/uL (ref 4.0–10.5)
nRBC: 0 % (ref 0.0–0.2)

## 2019-06-14 LAB — TROPONIN I (HIGH SENSITIVITY)
Troponin I (High Sensitivity): 5 ng/L (ref <18)
Troponin I (High Sensitivity): 7 ng/L (ref <18)

## 2019-06-14 LAB — CBG MONITORING, ED: Glucose-Capillary: 123 mg/dL — ABNORMAL HIGH (ref 70–99)

## 2019-06-14 SURGERY — DECOMPRESSION, ENDOLYMPHATIC SAC
Anesthesia: General | Site: Ear | Laterality: Left

## 2019-06-14 MED ORDER — FENTANYL CITRATE (PF) 100 MCG/2ML IJ SOLN: 50.0000 ug | INTRAMUSCULAR | Status: DC | PRN

## 2019-06-14 MED ORDER — PROCHLORPERAZINE EDISYLATE 10 MG/2ML IJ SOLN
10.0000 mg | Freq: Once | INTRAMUSCULAR | Status: AC
  Administered 2019-06-14: 10 mg via INTRAVENOUS
  Filled 2019-06-14: qty 2

## 2019-06-14 MED ORDER — MECLIZINE HCL 25 MG PO TABS
25.0000 mg | ORAL_TABLET | Freq: Once | ORAL | Status: AC
  Administered 2019-06-14: 25 mg via ORAL
  Filled 2019-06-14: qty 1

## 2019-06-14 MED ORDER — PHENYLEPHRINE HCL-NACL 10-0.9 MG/250ML-% IV SOLN
INTRAVENOUS | Status: DC | PRN
  Administered 2019-06-14: 50 ug/min via INTRAVENOUS

## 2019-06-14 MED ORDER — DEXAMETHASONE SODIUM PHOSPHATE 10 MG/ML IJ SOLN
INTRAMUSCULAR | Status: AC
  Filled 2019-06-14: qty 1

## 2019-06-14 MED ORDER — CEFAZOLIN SODIUM-DEXTROSE 2-3 GM-%(50ML) IV SOLR
INTRAVENOUS | Status: DC | PRN
  Administered 2019-06-14: 2 g via INTRAVENOUS

## 2019-06-14 MED ORDER — SCOPOLAMINE 1 MG/3DAYS TD PT72
MEDICATED_PATCH | TRANSDERMAL | Status: AC
  Filled 2019-06-14: qty 1

## 2019-06-14 MED ORDER — ROCURONIUM BROMIDE 10 MG/ML (PF) SYRINGE
PREFILLED_SYRINGE | INTRAVENOUS | Status: AC
  Filled 2019-06-14: qty 10

## 2019-06-14 MED ORDER — MIDAZOLAM HCL 2 MG/2ML IJ SOLN: 1.0000 mg | INTRAMUSCULAR | Status: DC | PRN

## 2019-06-14 MED ORDER — DEXMEDETOMIDINE HCL 200 MCG/2ML IV SOLN
INTRAVENOUS | Status: DC | PRN
  Administered 2019-06-14 (×2): 12 ug via INTRAVENOUS

## 2019-06-14 MED ORDER — ONDANSETRON HCL 4 MG/2ML IJ SOLN
4.0000 mg | Freq: Once | INTRAMUSCULAR | Status: AC
  Administered 2019-06-14: 4 mg via INTRAVENOUS

## 2019-06-14 MED ORDER — ONDANSETRON HCL 4 MG/2ML IJ SOLN
4.0000 mg | Freq: Once | INTRAMUSCULAR | Status: AC
  Administered 2019-06-14: 4 mg via INTRAVENOUS
  Filled 2019-06-14: qty 2

## 2019-06-14 MED ORDER — LIDOCAINE-EPINEPHRINE 1 %-1:100000 IJ SOLN
INTRAMUSCULAR | Status: DC | PRN
  Administered 2019-06-14: 2 mL

## 2019-06-14 MED ORDER — OXYCODONE-ACETAMINOPHEN 5-325 MG PO TABS: 1.0000 | ORAL_TABLET | ORAL | 0 refills | Status: AC | PRN

## 2019-06-14 MED ORDER — ONDANSETRON HCL 4 MG/2ML IJ SOLN
INTRAMUSCULAR | Status: AC
  Filled 2019-06-14: qty 2

## 2019-06-14 MED ORDER — DIPHENHYDRAMINE HCL 50 MG/ML IJ SOLN
25.0000 mg | Freq: Once | INTRAMUSCULAR | Status: AC
  Administered 2019-06-14: 15:00:00 25 mg via INTRAVENOUS
  Filled 2019-06-14: qty 1

## 2019-06-14 MED ORDER — CIPROFLOXACIN-FLUOCINOLONE PF 0.3-0.025 % OT SOLN
OTIC | Status: DC | PRN
  Administered 2019-06-14: 0.25 mL via OTIC

## 2019-06-14 MED ORDER — LIDOCAINE 2% (20 MG/ML) 5 ML SYRINGE
INTRAMUSCULAR | Status: DC | PRN
  Administered 2019-06-14: 60 mg via INTRAVENOUS

## 2019-06-14 MED ORDER — EPHEDRINE SULFATE-NACL 50-0.9 MG/10ML-% IV SOSY
PREFILLED_SYRINGE | INTRAVENOUS | Status: DC | PRN
  Administered 2019-06-14: 10 mg via INTRAVENOUS

## 2019-06-14 MED ORDER — PROMETHAZINE HCL 25 MG/ML IJ SOLN
INTRAMUSCULAR | Status: AC
  Filled 2019-06-14: qty 1

## 2019-06-14 MED ORDER — ROCURONIUM BROMIDE 10 MG/ML (PF) SYRINGE
PREFILLED_SYRINGE | INTRAVENOUS | Status: DC | PRN
  Administered 2019-06-14: 50 mg via INTRAVENOUS

## 2019-06-14 MED ORDER — EPINEPHRINE PF 1 MG/ML IJ SOLN
INTRAMUSCULAR | Status: DC | PRN
  Administered 2019-06-14: .2 mg

## 2019-06-14 MED ORDER — PHENYLEPHRINE 40 MCG/ML (10ML) SYRINGE FOR IV PUSH (FOR BLOOD PRESSURE SUPPORT)
PREFILLED_SYRINGE | INTRAVENOUS | Status: DC | PRN
  Administered 2019-06-14 (×5): 80 ug via INTRAVENOUS

## 2019-06-14 MED ORDER — PROMETHAZINE HCL 25 MG/ML IJ SOLN
12.5000 mg | Freq: Once | INTRAMUSCULAR | Status: AC | PRN
  Administered 2019-06-14: 12.5 mg via INTRAVENOUS

## 2019-06-14 MED ORDER — FENTANYL CITRATE (PF) 100 MCG/2ML IJ SOLN
INTRAMUSCULAR | Status: AC
  Filled 2019-06-14: qty 2

## 2019-06-14 MED ORDER — SUGAMMADEX SODIUM 200 MG/2ML IV SOLN
INTRAVENOUS | Status: DC | PRN
  Administered 2019-06-14: 200 mg via INTRAVENOUS

## 2019-06-14 MED ORDER — SCOPOLAMINE 1 MG/3DAYS TD PT72
1.0000 | MEDICATED_PATCH | TRANSDERMAL | Status: DC
  Administered 2019-06-14: 08:00:00 1.5 mg via TRANSDERMAL

## 2019-06-14 MED ORDER — PROMETHAZINE HCL 25 MG/ML IJ SOLN
INTRAMUSCULAR | Status: DC | PRN
  Administered 2019-06-14: 12.5 mg via INTRAVENOUS

## 2019-06-14 MED ORDER — CEFAZOLIN SODIUM-DEXTROSE 2-4 GM/100ML-% IV SOLN
INTRAVENOUS | Status: AC
  Filled 2019-06-14: qty 100

## 2019-06-14 MED ORDER — LIDOCAINE 2% (20 MG/ML) 5 ML SYRINGE
INTRAMUSCULAR | Status: AC
  Filled 2019-06-14: qty 5

## 2019-06-14 MED ORDER — NITROGLYCERIN 0.4 MG SL SUBL
0.4000 mg | SUBLINGUAL_TABLET | SUBLINGUAL | Status: DC | PRN
  Administered 2019-06-14 (×3): 0.4 mg via SUBLINGUAL

## 2019-06-14 MED ORDER — PROPOFOL 10 MG/ML IV BOLUS
INTRAVENOUS | Status: DC | PRN
  Administered 2019-06-14: 50 mg via INTRAVENOUS
  Administered 2019-06-14: 110 mg via INTRAVENOUS

## 2019-06-14 MED ORDER — ONDANSETRON HCL 4 MG/2ML IJ SOLN
INTRAMUSCULAR | Status: DC | PRN
  Administered 2019-06-14: 4 mg via INTRAVENOUS

## 2019-06-14 MED ORDER — PHENYLEPHRINE HCL (PRESSORS) 10 MG/ML IV SOLN
INTRAVENOUS | Status: AC
  Filled 2019-06-14: qty 1

## 2019-06-14 MED ORDER — AMOXICILLIN 875 MG PO TABS: 875.0000 mg | ORAL_TABLET | Freq: Two times a day (BID) | ORAL | 0 refills | Status: AC

## 2019-06-14 MED ORDER — ARTIFICIAL TEARS OPHTHALMIC OINT
TOPICAL_OINTMENT | OPHTHALMIC | Status: AC
  Filled 2019-06-14: qty 7

## 2019-06-14 MED ORDER — EPHEDRINE 5 MG/ML INJ
INTRAVENOUS | Status: AC
  Filled 2019-06-14: qty 10

## 2019-06-14 MED ORDER — FENTANYL CITRATE (PF) 100 MCG/2ML IJ SOLN
INTRAMUSCULAR | Status: DC | PRN
  Administered 2019-06-14 (×2): 50 ug via INTRAVENOUS

## 2019-06-14 MED ORDER — LACTATED RINGERS IV SOLN: INTRAVENOUS | Status: DC

## 2019-06-14 MED ORDER — DEXAMETHASONE SODIUM PHOSPHATE 4 MG/ML IJ SOLN
INTRAMUSCULAR | Status: DC | PRN
  Administered 2019-06-14: 5 mg via INTRAVENOUS

## 2019-06-14 SURGICAL SUPPLY — 64 items
ADH SKN CLS APL DERMABOND .7 (GAUZE/BANDAGES/DRESSINGS) ×1
BALL CTTN LRG ABS STRL LF (GAUZE/BANDAGES/DRESSINGS) ×1
BLADE CLIPPER SURG (BLADE) ×2 IMPLANT
BUR DIAMOND COARSE 3.0 (BURR) ×2 IMPLANT
BUR RND OSTEON ELITE 6.0 (BURR) ×1 IMPLANT
BUR RND OSTEON ELITE 6.0MM (BURR) ×1
CANISTER SUCT 1200ML W/VALVE (MISCELLANEOUS) ×3 IMPLANT
CORD BIPOLAR FORCEPS 12FT (ELECTRODE) ×3 IMPLANT
COTTONBALL LRG STERILE PKG (GAUZE/BANDAGES/DRESSINGS) ×3 IMPLANT
COVER WAND RF STERILE (DRAPES) IMPLANT
DECANTER SPIKE VIAL GLASS SM (MISCELLANEOUS) ×1 IMPLANT
DERMABOND ADVANCED (GAUZE/BANDAGES/DRESSINGS) ×2
DERMABOND ADVANCED .7 DNX12 (GAUZE/BANDAGES/DRESSINGS) ×1 IMPLANT
DRAPE MICROSCOPE WILD 40.5X102 (DRAPES) ×3 IMPLANT
DRAPE SURG 17X23 STRL (DRAPES) ×3 IMPLANT
DRAPE SURG IRRIG POUCH 19X23 (DRAPES) ×3 IMPLANT
DRSG GLASSCOCK MASTOID ADT (GAUZE/BANDAGES/DRESSINGS) ×2 IMPLANT
ELECT COATED BLADE 2.86 ST (ELECTRODE) ×3 IMPLANT
ELECT PAIRED SUBDERMAL (MISCELLANEOUS) ×3
ELECT REM PT RETURN 9FT ADLT (ELECTROSURGICAL) ×3
ELECTRODE PAIRED SUBDERMAL (MISCELLANEOUS) ×1 IMPLANT
ELECTRODE REM PT RTRN 9FT ADLT (ELECTROSURGICAL) ×1 IMPLANT
FORCEPS BIPOLAR SPETZLER 8 1.0 (NEUROSURGERY SUPPLIES) ×3 IMPLANT
GAUZE SPONGE 4X4 12PLY STRL (GAUZE/BANDAGES/DRESSINGS) ×2 IMPLANT
GAUZE SPONGE 4X4 12PLY STRL LF (GAUZE/BANDAGES/DRESSINGS) IMPLANT
GAUZE SPONGE 4X4 16PLY XRAY LF (GAUZE/BANDAGES/DRESSINGS) IMPLANT
GLOVE BIO SURGEON STRL SZ7 (GLOVE) ×4 IMPLANT
GLOVE BIO SURGEON STRL SZ7.5 (GLOVE) ×3 IMPLANT
GLOVE BIOGEL PI IND STRL 6.5 (GLOVE) IMPLANT
GLOVE BIOGEL PI IND STRL 7.5 (GLOVE) IMPLANT
GLOVE BIOGEL PI INDICATOR 6.5 (GLOVE) ×2
GLOVE BIOGEL PI INDICATOR 7.5 (GLOVE) ×2
GOWN STRL REUS W/ TWL LRG LVL3 (GOWN DISPOSABLE) ×2 IMPLANT
GOWN STRL REUS W/ TWL XL LVL3 (GOWN DISPOSABLE) IMPLANT
GOWN STRL REUS W/TWL LRG LVL3 (GOWN DISPOSABLE) ×3
GOWN STRL REUS W/TWL XL LVL3 (GOWN DISPOSABLE) ×3
HEMOSTAT SURGICEL .5X2 ABSORB (HEMOSTASIS) IMPLANT
HEMOSTAT SURGICEL 2X14 (HEMOSTASIS) IMPLANT
IV NS 500ML (IV SOLUTION) ×3
IV NS 500ML BAXH (IV SOLUTION) ×1 IMPLANT
IV SET EXT 30 76VOL 4 MALE LL (IV SETS) ×3 IMPLANT
NDL HYPO 25X1 1.5 SAFETY (NEEDLE) ×1 IMPLANT
NDL SAFETY ECLIPSE 18X1.5 (NEEDLE) ×1 IMPLANT
NEEDLE HYPO 18GX1.5 SHARP (NEEDLE) ×3
NEEDLE HYPO 25X1 1.5 SAFETY (NEEDLE) ×3 IMPLANT
NS IRRIG 1000ML POUR BTL (IV SOLUTION) ×3 IMPLANT
PACK BASIN DAY SURGERY FS (CUSTOM PROCEDURE TRAY) ×3 IMPLANT
PACK ENT DAY SURGERY (CUSTOM PROCEDURE TRAY) ×3 IMPLANT
PAD ALCOHOL SWAB (MISCELLANEOUS) ×6 IMPLANT
PENCIL SMOKE EVACUATOR (MISCELLANEOUS) ×3 IMPLANT
PROBE NERVBE PRASS .33 (MISCELLANEOUS) ×2 IMPLANT
SLEEVE IRRIGATION ELITE 7 (MISCELLANEOUS) IMPLANT
SLEEVE SCD COMPRESS KNEE MED (MISCELLANEOUS) ×3 IMPLANT
SPONGE SURGIFOAM ABS GEL 12-7 (HEMOSTASIS) ×2 IMPLANT
SUT BONE WAX W31G (SUTURE) IMPLANT
SUT SILK 3 0 TIES 17X18 (SUTURE)
SUT SILK 3-0 18XBRD TIE BLK (SUTURE) IMPLANT
SUT SILK 4 0 TIES 17X18 (SUTURE) IMPLANT
SUT VIC AB 3-0 FS2 27 (SUTURE) IMPLANT
SUT VICRYL 4-0 PS2 18IN ABS (SUTURE) ×3 IMPLANT
SYR BULB 3OZ (MISCELLANEOUS) ×3 IMPLANT
TOWEL GREEN STERILE FF (TOWEL DISPOSABLE) ×3 IMPLANT
TRAY DSU PREP LF (CUSTOM PROCEDURE TRAY) ×3 IMPLANT
TUBING IRRIGATION (MISCELLANEOUS) ×3 IMPLANT

## 2019-06-14 NOTE — Op Note (Signed)
DATE OF PROCEDURE: 06/14/2019  SURGEON: Leta Baptist, MD  OPERATIVE REPORT   PREOPERATIVE DIAGNOSIS:  1. Left ear Mnire's disease. 2. Recurrent vertigo.  POSTOPERATIVE DIAGNOSIS:  1. Left ear Mnire's disease. 2. Recurrent vertigo.  PROCEDURES PERFORMED: 1. Left ear endolymphatic sac decompression.  ANESTHESIA: General endotracheal tube anesthesia.  COMPLICATIONS: None.  ESTIMATED BLOOD LOSS: 50 ml  INDICATION FOR PROCEDURE:   Andrew Bates is a 73 y.o. male with a history of recurrent vertigo.  He was previously seen by Dr. Melony Overly, [facility], and a neurologist at [facility]  No definitive diagnosis was made.  The patient has been taking Meclizine, Valium and antiemetic medications.  The patient complains his recurrent dizziness has worsened over the past 2 months.  He has had numerous violent spinning vertigo, nausea, vomiting and diffuse headache.  He also complains of increasing pressure sensation in his left ear.  He recently underwent vestibular neurodiagnostic testing in our office.  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  His right ear electrocochleography ratio was normal. The patient's previous audiogram in 07/2018 showed bilateral high frequency sensorineural hearing loss, with slight asymmetry on the left side at low frequencies.  Based on the above findings, the decision was made for patient to undergo the above-stated procedure. The risks, benefits, alternatives, and details of the procedures were discussed with the patient. Questions were invited and answered. Informed consent was obtained.  DESCRIPTION OF PROCEDURE: The patient was taken to the operating room and placed supine on the operating table. General endotracheal tube anesthesia was induced by the anesthesiologist.The patient was positioned and prepped and draped in a standard fashion for left  ear surgery. Facial nerve monitoring electrodes were placed. The facial nerve monitoring system was functional throughout the case.  1% lidocaine with 1-100,000 epinephrine was infiltrated into the left postauricular crease. A standard postauricular incision was made. The soft tissue covering the mastoid cortex was carefully elevated.  Using a #6 cutting bur, a standard cortical mastoidectomy procedure was performed. The tegmen and sigmoid sinus were identified and preserved. The posterior canal wall was taken down to the level of the facial nerve. The antrum was entered.  The bony covering of the posterior cranial fossa and the sigmoid sinus were removed using a combination of the cutting bur and a #3 diamond bur.  The endolymphatic sac was noted anterior to the sigmoid sinus.  The bony covering over the endolymphatic sac was removed, achieving the decompression procedure.  The postauricular incision was then closed in layers with 4-0 Vicryl and Dermabond. A Glasscock dressing was applied. That concluded the procedure for the patient. The care of the patient was turned over to the anesthesiologist. The patient was awakened from anesthesia without difficulty. He was extubated and transferred to the recovery room in good condition.  OPERATIVE FINDINGS:The left endolymphatic sac was decompressed.  SPECIMEN:None.  FOLLOWUP CARE: The patient will be discharged home once he is awake and alert. He will follow up in my office in 1 week.

## 2019-06-14 NOTE — ED Triage Notes (Signed)
Pt from PACU for draining of L fluid in ear. Pt complained of CP post op. Hx of chronic CP, cardiac cath in feb that was unremarkable. EKG unremarkable, VSS, pt reports nausea, was under general anesthesia with ET tube, gave 8mg  zofran, 25mg  phenergen, 3 nitros Out of surgery since 1030 today  20 L wrist

## 2019-06-14 NOTE — Anesthesia Preprocedure Evaluation (Signed)
Anesthesia Evaluation  Patient identified by MRN, date of birth, ID band Patient awake    Reviewed: Allergy & Precautions, NPO status , Patient's Chart, lab work & pertinent test results  History of Anesthesia Complications (+) PONV  Airway Mallampati: I  TM Distance: >3 FB Neck ROM: Full    Dental   Pulmonary former smoker,    Pulmonary exam normal        Cardiovascular hypertension, Pt. on medications Normal cardiovascular exam     Neuro/Psych Anxiety    GI/Hepatic GERD  Medicated and Controlled,  Endo/Other    Renal/GU      Musculoskeletal   Abdominal   Peds  Hematology   Anesthesia Other Findings   Reproductive/Obstetrics                             Anesthesia Physical Anesthesia Plan  ASA: III  Anesthesia Plan: General   Post-op Pain Management:    Induction: Intravenous  PONV Risk Score and Plan: 3 and Ondansetron, Midazolam, Scopolamine patch - Pre-op, Diphenhydramine and Dexamethasone  Airway Management Planned: Oral ETT  Additional Equipment:   Intra-op Plan:   Post-operative Plan: Extubation in OR  Informed Consent: I have reviewed the patients History and Physical, chart, labs and discussed the procedure including the risks, benefits and alternatives for the proposed anesthesia with the patient or authorized representative who has indicated his/her understanding and acceptance.       Plan Discussed with: CRNA and Surgeon  Anesthesia Plan Comments:         Anesthesia Quick Evaluation

## 2019-06-14 NOTE — Anesthesia Procedure Notes (Signed)
Procedure Name: Intubation Date/Time: 06/14/2019 8:43 AM Performed by: Lieutenant Diego, CRNA Pre-anesthesia Checklist: Patient identified, Emergency Drugs available, Suction available and Patient being monitored Patient Re-evaluated:Patient Re-evaluated prior to induction Oxygen Delivery Method: Circle system utilized Preoxygenation: Pre-oxygenation with 100% oxygen Induction Type: IV induction Ventilation: Mask ventilation without difficulty Laryngoscope Size: Miller and 2 Grade View: Grade I Tube type: Oral Tube size: 8.0 mm Number of attempts: 1 Airway Equipment and Method: Stylet and Oral airway Placement Confirmation: ETT inserted through vocal cords under direct vision,  positive ETCO2 and breath sounds checked- equal and bilateral Secured at: 22 cm Tube secured with: Tape Dental Injury: Teeth and Oropharynx as per pre-operative assessment

## 2019-06-14 NOTE — ED Provider Notes (Signed)
4:30 PM.  Checkout from Dr. Tyrone Bates to evaluate after second troponin regarding possible myocardial infarct associated with chest pain and dizziness.  The patient was sent here from PACU, following surgery on his left ear, by Dr. Benjamine Bates.  It is unclear if he was an anticipated discharge home, following surgery, or not.  Patient had a cardiac catheterization last year that showed mild coronary disease, treated medically.    Patient Vitals for the past 24 hrs:  BP Temp Temp src Pulse Resp SpO2 Height Weight  06/14/19 1700 125/83 -- -- 76 (!) 28 97 % -- --  06/14/19 1600 132/79 -- -- 68 15 98 % -- --  06/14/19 1547 120/79 -- -- 71 16 96 % -- --  06/14/19 1500 122/76 -- -- 72 19 96 % -- --  06/14/19 1430 122/76 -- -- 60 17 98 % -- --  06/14/19 1423 -- (!) 97.5 F (36.4 C) Oral -- -- -- -- --  06/14/19 1421 -- -- -- 70 (!) 21 100 % -- --  06/14/19 1419 115/72 -- -- -- (!) 22 -- -- --  06/14/19 1300 107/72 -- -- 72 18 100 % -- --  06/14/19 1245 115/74 -- -- 65 16 100 % -- --  06/14/19 1230 114/66 -- -- (!) 57 19 100 % -- --  06/14/19 1215 (!) 104/56 -- -- 70 20 92 % -- --  06/14/19 1200 114/62 -- -- 62 19 96 % -- --  06/14/19 1145 116/71 -- -- (!) 57 15 97 % -- --  06/14/19 1130 118/65 -- -- 62 17 97 % -- --  06/14/19 1115 125/75 -- -- 68 20 96 % -- --  06/14/19 1100 140/77 -- -- 73 19 99 % -- --  06/14/19 1052 132/88 97.6 F (36.4 C) -- 76 15 95 % -- --  06/14/19 0747 (!) 142/95 (!) 96 F (35.6 C) Temporal 73 18 100 % 6\' 1"  (1.854 m) 86.2 kg    6:48 PM Reevaluation with update and discussion. After initial assessment and treatment, an updated evaluation reveals at this time patient states his chest pain, nausea and vertigo have all resolved.  He feels like he had chest pressure today, postoperatively as he awoke from anesthesia.  He states that he typically gets chest discomfort, when he lies down at night.  This morning prior to surgery, he had to get up early because of the chest pressure.  I  attempted to reach the the patient's ENT physician, but he was not available on his cell phone.  I did leave a message regarding the current status.  At this time the patient feels well and wants to go home.  Findings discussed and questions answered. Andrew Bates   Medical Decision Making: Patient with chest pain consistent with reflux.  Currently taking PPI.  He has been postoperative today after receiving anesthesia, and was sent here after initial treatment with nitroglycerin.  Patient had recovery of his normal status, and was asymptomatic at 6:45 PM.  Doubt ACS, PE or pneumonia.  Doubt significant anesthesia complication.  Suspect reflux as cause of his pain.  He is currently taking PPI.  He can supplement with antacid if needed.  Andrew Bates was evaluated in Emergency Department on 06/14/2019 for the symptoms described in the history of present illness. He was evaluated in the context of the global COVID-19 pandemic, which necessitated consideration that the patient might be at risk for infection with the SARS-CoV-2 virus that causes COVID-19.  Institutional protocols and algorithms that pertain to the evaluation of patients at risk for COVID-19 are in a state of rapid change based on information released by regulatory bodies including the CDC and federal and state organizations. These policies and algorithms were followed during the patient's care in the ED.  CRITICAL CARE-no Performed by: Andrew Bates  Nursing Notes Reviewed/ Care Coordinated Applicable Imaging Reviewed Interpretation of Laboratory Data incorporated into ED treatment  The patient appears reasonably screened and/or stabilized for discharge and I doubt any other medical condition or other [facility] requiring further screening, evaluation, or treatment in the ED at this time prior to discharge.  Plan: Home Medications-usual; Home Treatments-gradually [employer] and activity; return here if the recommended treatment, does not  improve the symptoms; Recommended follow up-ENT follow-up regarding surgery as planned.  Consult cardiology if having ongoing symptoms of a concerning nature.     Andrew Bo, MD 06/14/19 [phone]

## 2019-06-14 NOTE — H&P (Signed)
Recurrent vertigo, Meniere's disease  HPI: The patient is a 73 year old male who returns today for evaluation of his recurrent dizziness.  The patient was last seen 1 week ago.  At that time, he was experiencing severe vertigo, with violent spinning sensation that resulted in nausea and severe distress.  He was transported by ambulance to the E.R.  The patient has a long history of recurrent vertigo.  He was previously seen by Dr. Melony Overly, [facility], and a neurologist at [facility].  No definitive diagnosis was made.  The patient has been taking Meclizine, Valium and antiemetic medications.  The patient complains his recurrent dizziness has worsened over the past 2 months.  He has had numerous violent spinning vertigo, nausea, vomiting and diffuse headache.  He also complains of increasing pressure sensation in his left ear.  He recently underwent vestibular neurodiagnostic testing in our office.  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  His right ear electrocochleography ratio was normal.  The patient could not tolerate the rotational chair testing due to his severe dizziness and elevated blood pressure.  The patient's previous audiogram in 07/2018 showed bilateral high frequency sensorineural hearing loss, with slight asymmetry on the left side at low frequencies.  The patient reports his left ear hearing fluctuates over time.  No other ENT, GI, or respiratory issue noted since the last visit.   Exam: General: Communicates without difficulty, well nourished, no acute distress. Head: Normocephalic, no evidence injury, no tenderness, facial buttresses intact without stepoff. Face/sinus: No tenderness to palpation and percussion. Facial movement is normal and symmetric. Eyes: PERRL, EOMI. No scleral icterus, conjunctivae clear. Neuro: CN II exam reveals vision grossly intact.  No nystagmus at any point of gaze. Ears: Auricles  well formed without lesions.  Ear canals are intact without mass or lesion.  No erythema or edema is appreciated.  The TMs are intact without fluid. Nose: External evaluation reveals normal support and skin without lesions.  Dorsum is intact.  Anterior rhinoscopy reveals pink mucosa over anterior aspect of inferior turbinates and intact septum.  No purulence noted. Oral:  Oral cavity and oropharynx are intact, symmetric, without erythema or edema.  Mucosa is moist without lesions. Neck: Full range of motion without pain.  There is no significant lymphadenopathy.  No masses palpable.  Thyroid bed within normal limits to palpation.  Parotid glands and submandibular glands equal bilaterally without mass.  Trachea is midline. Neuro:  CN 2-12 grossly intact. Gait normal. Vestibular: No nystagmus at any point of gaze. The cerebellar examination is unremarkable. Vestibular: There is no nystagmus with pneumatic pressure on either tympanic membrane or Valsalva.   AUDIOMETRIC TESTING:  I reviewed the audiometric result. The test shows bilateral high-frequency sensorineural hearing loss. The speech reception threshold is 15dB AD and 15dB AS. The discrimination score is 68% AD and 68% AS. The tympanogram is normal bilaterally.   Assessment 1.  The patient's recurrent vertigo, left aural pressure, and fluctuating left ear hearing loss are all suggestive of Meniere's disease.  His electrocochleography test in 07/2018 was suggestive of left ear endolymphatic hydrops.   2.  The patient's ear canals, tympanic membranes and middle ear spaces are all normal.  3.  Bilateral high frequency sensorineural hearing loss, consistent with presbycusis. No significant asymmetry is noted today.  He is asymptomatic today.   Plan 1.  The physical exam findings and the hearing test results are reviewed with the patient and his wife.  2.  The pathophysiology of Meniere's disease is extensively discussed.  The treatment options for  Meniere's disease, including low salt diet, diuretics, topical and systemic steroid and gentamicin treatment, and endolymphatic sac decompression surgery are all extensively reviewed.  3.  Prednisone dosepak for 12 days prn recurrent symptoms.  4.  1500 mg low salt diet.  5.  If his symptoms persist, he will benefit from undergoing endolymphatic sac decompression surgery on the left side.  The risks, benefits, and details of the procedure are discussed.  6.  The patient is interested in proceeding with the endolymphatic sac decompression surgery.  We will schedule the procedure in accordance with the patient's schedule.

## 2019-06-14 NOTE — ED Provider Notes (Signed)
[facility] EMERGENCY DEPARTMENT Provider Note   CSN: [id number] Arrival date & time: 06/14/19  1402     History Chief Complaint  Patient presents with  . Chest Pain  . Nausea    Andrew Bates is a 73 y.o. male.  73 yo M with a chief complaints of dizziness.  Patient has had episodes of vertigo off and on.  Has worsened over the past few months.  This morning he had a procedure done by an ear nose and throat physician.  Upon awakening the patient had severe dizziness and chest pain.  He has been having the chest pain with these episodes.  He describes them as spells.  Has been seen by cardiology for these.  Had a cardiac catheterization 10 months ago for the same.  Had diffuse mild disease.  No obstructive lesion.  Feels that his symptoms have worsened over the past week.  States he is taken a couple nitros off and on.  They have also been exertional.  He felt very fatigued.  Currently describes his chest pain as a pressure.  No radiation.  No shortness of breath currently.  Has shortness of breath typically on exertion.  Also describing vertigo-like symptoms with nausea.  Improved with closing his eyes.  The history is provided by the patient.  Chest Pain Pain location:  Substernal area Pain quality: pressure   Pain radiates to:  Does not radiate Pain severity:  Mild Onset quality:  Sudden Duration:  2 weeks Timing:  Intermittent Progression:  Waxing and waning Chronicity:  New Relieved by:  Nothing Worsened by:  Nothing Ineffective treatments:  None tried Associated symptoms: dizziness, nausea, shortness of breath (not currently but on exertion) and vomiting   Associated symptoms: no abdominal pain, no fever, no headache and no palpitations        Past Medical History:  Diagnosis Date  . Adenomatous colon polyp   . Anxiety   . Arthritis   . Asthma   . Complication of anesthesia    slow to come back awake- per patient/ and makes him sick per wife  .  Coronary artery disease   . DVT (deep venous thrombosis) ([place]) 2008  . Essential hypertension   . GERD (gastroesophageal reflux disease)   . History of kidney stones   . Hyperlipidemia   . Internal hemorrhoids   . Peripheral neuropathy   . PONV (postoperative nausea and vomiting)   . Post traumatic stress disorder (PTSD)   . Sleep apnea   . Vertigo     Patient Active Problem List   Diagnosis Date Noted  . CAD (coronary artery disease) 01/10/2019  . Unstable angina ([place]) 08/17/2018  . Precordial pain   . Cervical spondylosis with radiculopathy 07/09/2015  . Urinary frequency 02/25/2015  . Dehydration 02/25/2015  . Vertigo 02/25/2015  . Hydronephrosis 02/15/2014  . AKI (acute kidney injury) ([place]) 02/15/2014  . Calculus of ureter 02/14/2014  . Urinary hesitancy 11/17/2013  . Overweight(278.02) 11/17/2013  . Kidney stones 11/16/2013  . Acute onset of severe vertigo 11/16/2013  . Sepsis secondary to UTI ([place]) 11/16/2013  . Hypertension 11/16/2013  . Hyperlipemia 11/16/2013  . GERD (gastroesophageal reflux disease) 11/16/2013    Past Surgical History:  Procedure Laterality Date  . ANTERIOR CERVICAL DECOMP/DISCECTOMY FUSION N/A 07/09/2015   Procedure: ANTERIOR CERVICAL DECOMPRESSION/DISCECTOMY FUSION CERVICAL FIVE-SIX,CERVICAL SIX-SEVEN;  Surgeon: Newman Pies, MD;  Location: [facility];  Service: Neurosurgery;  Laterality: N/A;  . CARPAL TUNNEL RELEASE Bilateral 2014  .  COLONOSCOPY    . CYSTOSCOPY/RETROGRADE/URETEROSCOPY/STONE EXTRACTION WITH BASKET Left 02/14/2014   Procedure: CYSTOSCOPY AND LEFT DOUBLE J STENT PLACEMENT;  ATTEMPTED STONE  EXTRACTION ;  Surgeon: Jorja Loa, MD;  Location: [facility];  Service: Urology;  Laterality: Left;  . ESOPHAGOGASTRODUODENOSCOPY ENDOSCOPY    . KNEE SURGERY Right 2006   Arthroscopy - MCl tear  . LEFT HEART CATH AND CORONARY ANGIOGRAPHY N/A 08/17/2018   Procedure: LEFT HEART CATH AND CORONARY ANGIOGRAPHY;  Surgeon: Burnell Blanks, MD;  Location: [facility];  Service: Cardiovascular;  Laterality: N/A;       Family History  Problem Relation Age of Onset  . Hypertension Father   . Hyperlipidemia Father   . Lung cancer Father        from piece of abestos lodged in his lung  . Thyroid cancer Mother   . Colon cancer Brother 53  . Bladder Cancer Brother     Social History   Tobacco Use  . Smoking status: Former Smoker    Types: Cigars  . Smokeless tobacco: Never Used  . Tobacco comment: quit 15 years ago (2017) never smoked alot  Substance Use Topics  . Alcohol use: No  . Drug use: No    Home Medications Prior to Admission medications   Medication Sig Start Date End Date Taking? Authorizing Provider  acetaminophen (TYLENOL) 500 MG tablet Take 500 mg by mouth every 6 (six) hours as needed for fever.   Yes [provider]  Artificial Tear Ointment (DRY EYES OP) Apply 1 application to eye as needed (dry eyes).    Yes [provider]  aspirin EC 81 MG EC tablet Take 1 tablet (81 mg total) by mouth daily. 08/19/18  Yes Kathyrn Drown D, NP  Biotin (BIOTIN 5000) 5 MG CAPS Take 10 mg by mouth daily.    Yes [provider]  fluorometholone (FML) 0.1 % ophthalmic suspension Place 1 drop into both eyes 3 (three) times daily.  03/29/19  Yes [provider]  mirtazapine (REMERON) 45 MG tablet Take 45 mg by mouth at bedtime.   Yes [provider]  nitroGLYCERIN (NITROSTAT) 0.4 MG SL tablet Place 1 tablet (0.4 mg total) under the tongue every 5 (five) minutes x 3 doses as needed for chest pain. Patient taking differently: Place 0.4 mg under the tongue every 5 (five) minutes x 3 doses as needed for chest pain.  08/18/18  Yes Kathyrn Drown D, NP  omeprazole (PRILOSEC) 20 MG capsule Take 20 mg by mouth every morning.    Yes [provider]  ondansetron (ZOFRAN ODT) 4 MG disintegrating tablet 4mg  ODT q4 hours prn nausea/vomit Patient taking differently:  Take 4 mg by mouth every 4 (four) hours as needed for nausea or vomiting.  07/31/18  Yes Milton Ferguson, MD  psyllium (METAMUCIL) 58.6 % powder Take 1 packet by mouth daily.    Yes [provider]  rosuvastatin (CRESTOR) 20 MG tablet Take 1 tablet (20 mg total) by mouth daily at 6 PM. 02/25/19  Yes Satira Sark, MD  amLODipine (NORVASC) 5 MG tablet Take 1 tablet (5 mg total) by mouth daily. 01/31/19 05/01/19  Imogene Burn, PA-C  amoxicillin (AMOXIL) 875 MG tablet Take 1 tablet (875 mg total) by mouth 2 (two) times daily for 3 days. 06/14/19 06/17/19  Leta Baptist, MD  losartan (COZAAR) 50 MG tablet Take 1 tablet (50 mg total) by mouth 2 (two) times a day. 01/10/19 04/12/19  Bonnell Public,  Jennye Moccasin, PA-C  ondansetron (ZOFRAN ODT) 4 MG disintegrating tablet Take 1 tablet (4 mg total) by mouth every 8 (eight) hours as needed for nausea or vomiting. 04/29/19   Amin, Jeanella Flattery, MD  oxyCODONE-acetaminophen (PERCOCET) 5-325 MG tablet Take 1 tablet by mouth every 4 (four) hours as needed for up to 3 days for severe pain. 06/14/19 06/17/19  Leta Baptist, MD    Allergies    Other, Tamsulosin, Codeine, Morphine and related, Pentazocine, Metoprolol, Sertraline, and Topiramate  Review of Systems   Review of Systems  Constitutional: Negative for chills and fever.  HENT: Negative for congestion and facial swelling.   Eyes: Negative for discharge and visual disturbance.  Respiratory: Positive for shortness of breath (not currently but on exertion).   Cardiovascular: Positive for chest pain. Negative for palpitations.  Gastrointestinal: Positive for nausea and vomiting. Negative for abdominal pain and diarrhea.  Musculoskeletal: Negative for arthralgias and myalgias.  Skin: Negative for color change and rash.  Neurological: Positive for dizziness. Negative for tremors, syncope and headaches.  Psychiatric/Behavioral: Negative for confusion and dysphoric mood.    Physical Exam Updated Vital Signs BP 120/79  (BP Location: Left Arm)   Pulse 71   Temp (!) 97.5 F (36.4 C) (Oral)   Resp 16   Ht 6\' 1"  (1.854 m)   Wt 86.2 kg   SpO2 96%   BMI 25.07 kg/m   Physical Exam Vitals and nursing note reviewed.  Constitutional:      Appearance: He is well-developed.  HENT:     Head: Normocephalic.     Comments: Dressing to left ear Eyes:     Pupils: Pupils are equal, round, and reactive to light.  Neck:     Vascular: No JVD.  Cardiovascular:     Rate and Rhythm: Normal rate and regular rhythm.     Heart sounds: No murmur. No friction rub. No gallop.   Pulmonary:     Effort: No respiratory distress.     Breath sounds: No wheezing.  Abdominal:     General: There is no distension.     Tenderness: There is no guarding or rebound.  Musculoskeletal:        General: Normal range of motion.     Cervical back: Normal range of motion and neck supple.  Skin:    Coloration: Skin is not pale.     Findings: No rash.  Neurological:     Mental Status: He is alert and oriented to person, place, and time.  Psychiatric:        Behavior: Behavior normal.     ED Results / Procedures / Treatments   Labs (all labs ordered are listed, but only abnormal results are displayed) Labs Reviewed  BASIC METABOLIC PANEL - Abnormal; Notable for the following components:      Result Value   Glucose, Bld 138 (*)    Creatinine, Ser 1.51 (*)    Calcium 8.5 (*)    GFR calc non Af Amer 45 (*)    GFR calc Af Amer 53 (*)    All other components within normal limits  CBC WITH DIFFERENTIAL/PLATELET - Abnormal; Notable for the following components:   RBC 4.20 (*)    MCV 100.5 (*)    Lymphs Abs 0.4 (*)    Abs Immature Granulocytes 0.14 (*)    All other components within normal limits  CBG MONITORING, ED  TROPONIN I (HIGH SENSITIVITY)  TROPONIN I (HIGH SENSITIVITY)    EKG None  Radiology  DG Chest 2 View  Result Date: 06/14/2019 CLINICAL DATA:  Chest pain after left ear surgery today. EXAM: CHEST - 2 VIEW  COMPARISON:  Single-view of the chest 04/27/2019. PA and lateral chest 08/17/2018. FINDINGS: Minimal atelectasis is seen in the lung bases. Lungs otherwise clear. Heart size normal. No pneumothorax or pleural fluid. No acute or focal bony abnormality. IMPRESSION: No acute disease. Electronically Signed   By: Inge Rise M.D.   On: 06/14/2019 15:25    Procedures Procedures (including critical care time)  Medications Ordered in ED Medications  ondansetron (ZOFRAN) injection 4 mg (has no administration in time range)  meclizine (ANTIVERT) tablet 25 mg (has no administration in time range)  ondansetron (ZOFRAN) injection 4 mg (4 mg Intravenous Given 06/14/19 1116)  promethazine (PHENERGAN) injection 12.5 mg (12.5 mg Intravenous Given 06/14/19 1130)  prochlorperazine (COMPAZINE) injection 10 mg (10 mg Intravenous Given 06/14/19 1454)  diphenhydrAMINE (BENADRYL) injection 25 mg (25 mg Intravenous Given 06/14/19 1454)    ED Course  I have reviewed the triage vital signs and the nursing notes.  Pertinent labs & imaging results that were available during my care of the patient were reviewed by me and considered in my medical decision making (see chart for details).    MDM Rules/Calculators/A&P                      73 yo M with a chief complaint of vertigo and chest pain.  He has been having issues with this off and on but worsening over the past month.  Had a procedure this morning.  Had a left ear endolymphatic sac decompression.  Will obtain labs, cxr, trop.  Symptomatic therapy.  Reassess.   Initial trop negative.  Discussed with cards, Dr.Turner, recommended second trop and discuss again with her.   Signed out to Dr. Eulis Foster, please see his note for further details care in ED.  The patients results and plan were reviewed and discussed.   Any x-rays performed were independently reviewed by myself.   Differential diagnosis were considered with the presenting HPI.  Medications   ondansetron (ZOFRAN) injection 4 mg (has no administration in time range)  meclizine (ANTIVERT) tablet 25 mg (has no administration in time range)  ondansetron (ZOFRAN) injection 4 mg (4 mg Intravenous Given 06/14/19 1116)  promethazine (PHENERGAN) injection 12.5 mg (12.5 mg Intravenous Given 06/14/19 1130)  prochlorperazine (COMPAZINE) injection 10 mg (10 mg Intravenous Given 06/14/19 1454)  diphenhydrAMINE (BENADRYL) injection 25 mg (25 mg Intravenous Given 06/14/19 1454)    Vitals:   06/14/19 1419 06/14/19 1421 06/14/19 1423 06/14/19 1547  BP: 115/72   120/79  Pulse:  70  71  Resp: (!) 22 (!) 21  16  Temp:   (!) 97.5 F (36.4 C)   TempSrc:   Oral   SpO2:  100%  96%  Weight:      Height:        Final diagnoses:  Vertigo  Atypical chest pain     Final Clinical Impression(s) / ED Diagnoses Final diagnoses:  Vertigo  Atypical chest pain    Rx / DC Orders ED Discharge Orders         Ordered    Activity as tolerated - No restrictions     06/14/19 1105    Diet general     06/14/19 1105    oxyCODONE-acetaminophen (PERCOCET) 5-325 MG tablet  Every 4 hours PRN     06/14/19 1105    amoxicillin (AMOXIL)  875 MG tablet  2 times daily     06/14/19 [place], [place], [place] 06/14/19 1643

## 2019-06-14 NOTE — Discharge Instructions (Addendum)
Evaluation for cardiac problems was reassuring today.  There is no sign of heart attack.  Your symptoms improved, after treatment with fluids and ondansetron.  It is safe for discharge at this time.  Follow the instructions for postoperative care from mastoidectomy, below.  Continue your usual medications.  Consider seeing your cardiologist if you have further symptoms that are concerning for heart problems.  Return here, if needed.     POSTOPERATIVE INSTRUCTIONS FOR PATIENTS HAVING MASTOIDECTOMY SURGERY  You may have nausea, vomiting, or a low grade fever for a few days after surgery. This is not unusual.  However, if the nausea and vomiting become severe or last more than one day, please call our office. Medication for nausea may be prescribed. You may take Tylenol every four hours for fever. If your fever should rise above 101 F, please contact our office. Limit your activities for one week. This includes avoiding heavy lifting (over 20lbs), vigorous exercise, and contact sports. Do not blow your nose for approximately one week.  Any accumulation in the nose should be drawn back into the throat and expectorated through the mouth to avoid infecting the ear. If it is necessary to sneeze, do so with your mouth open to decrease pressure to your ears. Do not hold your nose to avoid sneezing.  You may wash your hair 2 days after the operation. Please protect the ear and any external incision from water. We recommend placing some plastic wrap over the ear and incision to help protect against water. It may be necessary to have someone help you during the first several washings.  Try to keep the incision clean and dry. You should clean crust from the incisional area with diluted hydrogen peroxide. Any time you are going to clean your ear, please wash your hands thoroughly prior to starting. Some dull postoperative ear pain is expected. Your physician may prescribe pain medicine to help relieve your  discomfort. If your postoperative pain increases and your medication is not helping, please call the office before taking any other medication that we have not prescribed or recommended. If any of the following should occur, contact Dr.Teoh:   (Office: [phone])) Persistent bleeding                                                             Persistent fever Purulent drainage (pus) from the ear or incision Increasing redness around the suture line Persistent pain or dizziness Facial weakness Sometimes, with a larger incision behind the ear, the incision may open and drain. If it occurs, please contact our office. If your physician prescribes an antibiotic, fill the prescription promptly and take all of the medicine as directed until the entire supply is gone.  You may experience some popping and cracking sounds in the ear for up to several weeks. It may sound like you are "talking in a barrel" or a tunnel. This is normal and should not cause concern. Because a nerve for taste passes through your ear, it is not unusual for your taste sensation to be altered for several weeks or months. You may experience some numbness in your outer ear, earlobe, and the incision area. This is normal, and most of the numbness will be expected to fade over a period of time.  Your eardrum may look "pink" or "red" for up to a month postoperatively. The red coloration is due to fluid in the middle ear. The change in color should not be confused with infection.  It is important to return to our office for your postoperative appointment as scheduled. If for some reason you were not given a postoperative appointment, please call our office at [phone].     Post Anesthesia Home Care Instructions  Activity: Get plenty of rest for the remainder of the day. A responsible individual must stay with you for 24 hours following the procedure.  For the next  24 hours, DO NOT: -Drive a car -Paediatric nurse -Drink alcoholic beverages -Take any medication unless instructed by your physician -Make any legal decisions or sign important papers.  Meals: Start with liquid foods such as gelatin or soup. Progress to regular foods as tolerated. Avoid greasy, spicy, heavy foods. If nausea and/or vomiting occur, drink only clear liquids until the nausea and/or vomiting subsides. Call your physician if vomiting continues.  Special Instructions/Symptoms: Your throat may feel dry or sore from the anesthesia or the breathing tube placed in your throat during surgery. If this causes discomfort, gargle with warm salt water. The discomfort should disappear within 24 hours.  If you had a scopolamine patch placed behind your ear for the management of post- operative nausea and/or vomiting:  1. The medication in the patch is effective for 72 hours, after which it should be removed.  Wrap patch in a tissue and discard in the trash. Wash hands thoroughly with soap and water. 2. You may remove the patch earlier than 72 hours if you experience unpleasant side effects which may include dry mouth, dizziness or visual disturbances. 3. Avoid touching the patch. Wash your hands with soap and water after contact with the patch.

## 2019-06-14 NOTE — ED Notes (Signed)
Pt complaining of feeling worse after receiving most recent medications.  Feels more dizzy and generally yucky.

## 2019-06-14 NOTE — Anesthesia Postprocedure Evaluation (Signed)
Anesthesia Post Note  Patient: Andrew Bates  Procedure(s) Performed: ENDOLYMPHATIC [place] (Left Ear)     Patient location during evaluation: PACU Anesthesia Type: General Level of consciousness: awake and alert Pain management: pain level controlled Vital Signs Assessment: post-procedure vital signs reviewed and stable Respiratory status: spontaneous breathing, nonlabored ventilation, respiratory function stable and patient connected to nasal cannula oxygen Cardiovascular status: blood pressure returned to baseline and stable Postop Assessment: no apparent nausea or vomiting Anesthetic complications: no  Pt developed chest pain responding to NTG. Spoke with Dr Radford Pax who recommended Pt transferred to ED for evaluation.  Last Vitals:  Vitals:   06/14/19 1423 06/14/19 1547  BP:  120/79  Pulse:  71  Resp:  16  Temp: (!) 36.4 C   SpO2:  96%    Last Pain:  Vitals:   06/14/19 1423  TempSrc: Oral  PainSc: 3                  Kaysen Deal DAVID

## 2019-06-14 NOTE — Transfer of Care (Signed)
Immediate Anesthesia Transfer of Care Note  Patient: Andrew Bates  Procedure(s) Performed: ENDOLYMPHATIC SAC DECOMPRESSION (Left Ear)  Patient Location: PACU  Anesthesia Type:General  Level of Consciousness: awake and alert   Airway & Oxygen Therapy: Patient Spontanous Breathing and Patient connected to face mask oxygen  Post-op Assessment: Report given to RN and Post -op Vital signs reviewed and stable  Post vital signs: Reviewed and stable  Last Vitals:  Vitals Value Taken Time  BP 132/88 06/14/19 1052  Temp 36.4 C 06/14/19 1052  Pulse 70 06/14/19 1058  Resp 18 06/14/19 1058  SpO2 99 % 06/14/19 1058  Vitals shown include unvalidated device data.  Last Pain:  Vitals:   06/14/19 0747  TempSrc: Temporal  PainSc: 0-No pain         Complications: No apparent anesthesia complications

## 2019-06-14 NOTE — Progress Notes (Signed)
Pt with history of chest pain who had a cardiac cath 2/20 with nonsignificant CAD and preserved ventricular function, developed substernal chest pain upon emergence from an uneventful GA after ear Surgery by Dr Benjamine Mola. VSS. EKG not remarkably changed from preop. Relieved by sublingual NTG.  Plan: Cardiology Consult.Dr Radford Pax to see in [facility] ED and assume care.  Lillia Abed MD

## 2019-06-14 NOTE — Progress Notes (Addendum)
Pt started to have chest pain starting at 1154. Dr. Conrad [place] notified and 12-lead EKG performed at 1156. Nitroglycerin SL given at 1202. Second Nitroglycerin SL given at 1207 rating pain still a 4. Dr. Benjamine Mola notified and updated on patient's status at 1215. Patient stated after 80min, second Nitroglycerin brought pain from 5 to 3/10. At 1239, chest pain started to progress again (rating 4/10) and jaw pain. Dr. Conrad [place] aware and third Nitroglycerin SL 0.4mg  given. Dr. Radford Pax (cardiology) consulted at 1239 and agrees patient should be evaluated at ED to determine if patient should be admitted. At 1254, transfer put in place for patient to be evaluated at [facility] ED. EMS picked up patient at 1320 to transport patient. VS taken and are WDL before transport.

## 2019-06-15 ENCOUNTER — Encounter: Payer: Self-pay | Admitting: *Deleted

## 2019-06-22 ENCOUNTER — Ambulatory Visit (AMBULATORY_SURGERY_CENTER): Payer: PPO

## 2019-06-22 ENCOUNTER — Other Ambulatory Visit: Payer: Self-pay

## 2019-06-22 DIAGNOSIS — Z1159 Encounter for screening for other viral diseases: Secondary | ICD-10-CM

## 2019-06-22 DIAGNOSIS — F4310 Post-traumatic stress disorder, unspecified: Secondary | ICD-10-CM | POA: Insufficient documentation

## 2019-06-22 DIAGNOSIS — Z800 Family history of malignant neoplasm of digestive organs: Secondary | ICD-10-CM

## 2019-06-22 DIAGNOSIS — Z86010 Personal history of colonic polyps: Secondary | ICD-10-CM

## 2019-06-22 MED ORDER — NA SULFATE-K SULFATE-MG SULF 17.5-3.13-1.6 GM/177ML PO SOLN: 1.0000 | Freq: Once | ORAL | 0 refills | Status: AC

## 2019-06-22 NOTE — Progress Notes (Signed)
No egg or soy allergy known to patient  No issues with past sedation with any surgeries  or procedures, no intubation problems  No diet pills per patient No home 02 use per patient  No blood thinners per patient  Pt denies issues with constipation  No A fib or A flutter  EMMI video sent to pt's e mail  Pt has nausea after anesthesia but not difficulty waking, from general anesthetic not propofol.  Pt had MAC sedation 5 years ago and did fine with it. suprep sample given. Exp 9/22,lot [id number]   Due to the COVID-19 pandemic we are asking patients to follow these guidelines. Please only bring one care partner. Please be aware that your care partner may wait in the car in the parking lot or if they feel like they will be too hot to wait in the car, they may wait in the lobby on the 4th floor. All care partners are required to wear a mask the entire time (we do not have any that we can provide them), they need to practice social distancing, and we will do a Covid check for all patient's and care partners when you arrive. Also we will check their temperature and your temperature. If the care partner waits in their car they need to stay in the parking lot the entire time and we will call them on their cell phone when the patient is ready for discharge so they can bring the car to the front of the building. Also all patient's will need to wear a mask into building.

## 2019-06-29 ENCOUNTER — Encounter: Payer: Self-pay | Admitting: Internal Medicine

## 2019-07-01 ENCOUNTER — Other Ambulatory Visit (HOSPITAL_COMMUNITY)
Admission: RE | Admit: 2019-07-01 | Discharge: 2019-07-01 | Disposition: A | Discharge status: Home / Self Care | Payer: PPO | Source: Ambulatory Visit | Attending: Internal Medicine | Admitting: Internal Medicine

## 2019-07-01 ENCOUNTER — Other Ambulatory Visit: Payer: Self-pay

## 2019-07-01 DIAGNOSIS — Z20822 Contact with and (suspected) exposure to covid-19: Secondary | ICD-10-CM | POA: Diagnosis not present

## 2019-07-01 DIAGNOSIS — Z01818 Encounter for other preprocedural examination: Secondary | ICD-10-CM | POA: Insufficient documentation

## 2019-07-01 LAB — SARS CORONAVIRUS 2 (TAT 6-24 HRS): SARS Coronavirus 2: NEGATIVE

## 2019-07-06 ENCOUNTER — Ambulatory Visit (AMBULATORY_SURGERY_CENTER): Payer: PPO | Admitting: Internal Medicine

## 2019-07-06 ENCOUNTER — Other Ambulatory Visit: Payer: Self-pay

## 2019-07-06 ENCOUNTER — Encounter: Payer: Self-pay | Admitting: Internal Medicine

## 2019-07-06 VITALS — BP 129/89 | HR 74 | Temp 95.9°F | Resp 21 | Ht 73.0 in | Wt 194.4 lb

## 2019-07-06 DIAGNOSIS — D122 Benign neoplasm of ascending colon: Secondary | ICD-10-CM | POA: Diagnosis not present

## 2019-07-06 DIAGNOSIS — Z800 Family history of malignant neoplasm of digestive organs: Secondary | ICD-10-CM

## 2019-07-06 DIAGNOSIS — Z1211 Encounter for screening for malignant neoplasm of colon: Secondary | ICD-10-CM | POA: Diagnosis not present

## 2019-07-06 DIAGNOSIS — K635 Polyp of colon: Secondary | ICD-10-CM | POA: Diagnosis not present

## 2019-07-06 DIAGNOSIS — Z86010 Personal history of colonic polyps: Secondary | ICD-10-CM

## 2019-07-06 MED ORDER — SODIUM CHLORIDE 0.9 % IV SOLN: 500.0000 mL | Freq: Once | INTRAVENOUS | Status: DC

## 2019-07-06 NOTE — Patient Instructions (Signed)
YOU HAD AN ENDOSCOPIC PROCEDURE TODAY AT [facility]:   Refer to the procedure report that was given to you for any specific questions about what was found during the examination.  If the procedure report does not answer your questions, please call your gastroenterologist to clarify.  If you requested that your care partner not be given the details of your procedure findings, then the procedure report has been included in a sealed envelope for you to review at your convenience later.  YOU SHOULD EXPECT: Some feelings of bloating in the abdomen. Passage of more gas than usual.  Walking can help get rid of the air that was put into your GI tract during the procedure and reduce the bloating. If you had a lower endoscopy (such as a colonoscopy or flexible sigmoidoscopy) you may notice spotting of blood in your stool or on the toilet paper. If you underwent a bowel prep for your procedure, you may not have a normal bowel movement for a few days.  Please Note:  You might notice some irritation and congestion in your nose or some drainage.  This is from the oxygen used during your procedure.  There is no need for concern and it should clear up in a day or so.  SYMPTOMS TO REPORT IMMEDIATELY:   Following lower endoscopy (colonoscopy or flexible sigmoidoscopy):  Excessive amounts of blood in the stool  Significant tenderness or worsening of abdominal pains  Swelling of the abdomen that is new, acute  Fever of 100F or higher  For urgent or emergent issues, a gastroenterologist can be reached at any hour by calling [phone].   DIET:  We do recommend a small meal at first, but then you may proceed to your regular diet.  Drink plenty of fluids but you should avoid alcoholic beverages for 24 hours.  ACTIVITY:  You should plan to take it easy for the rest of today and you should NOT DRIVE or use heavy machinery until tomorrow (because of the sedation medicines used during the test).     FOLLOW UP: Our staff will call the number listed on your records 48-72 hours following your procedure to check on you and address any questions or concerns that you may have regarding the information given to you following your procedure. If we do not reach you, we will leave a message.  We will attempt to reach you two times.  During this call, we will ask if you have developed any symptoms of COVID 19. If you develop any symptoms (ie: fever, flu-like symptoms, shortness of breath, cough etc.) before then, please call [phone].  If you test positive for Covid 19 in the 2 weeks post procedure, please call and report this information to [place].    If any biopsies were taken you will be contacted by phone or by letter within the next 1-3 weeks.  Please call us at [phone] if you have not heard about the biopsies in 3 weeks.    SIGNATURES/CONFIDENTIALITY: You and/or your care partner have signed paperwork which will be entered into your electronic medical record.  These signatures attest to the fact that that the information above on your After Visit Summary has been reviewed and is understood.  Full responsibility of the confidentiality of this discharge information lies with you and/or your care-partner.    Handouts were given to you on polyps and hemorrhoids. You may resume your current medications today. Await biopsy results. Please call if any questions or  concerns.

## 2019-07-06 NOTE — Progress Notes (Addendum)
Pt has a hx of inter ear sx.  No dizziness noted in the recovery room.  No problems noted in the recovery room. maw

## 2019-07-06 NOTE — Progress Notes (Signed)
Temperature taken by J.B., VS taken by D.T. 

## 2019-07-06 NOTE — Progress Notes (Signed)
Pt Drowsy. VSS. To PACU, report to RN. No anesthetic complications noted.  

## 2019-07-06 NOTE — Progress Notes (Signed)
Called to room to assist during endoscopic procedure.  Patient ID and intended procedure confirmed with present staff. Received instructions for my participation in the procedure from the performing physician.  

## 2019-07-06 NOTE — Op Note (Signed)
Andrew Bates Patient Name: Andrew Bates Procedure Date: 07/06/2019 10:44 AM MRN: [id number] Endoscopist: Docia Chuck. Henrene Pastor , MD Age: 74 Referring MD:  Date of Birth: Dec 14, 1945 Gender: Male Account #: [id number] Procedure:                Colonoscopy with cold snare polypectomy x 2 Indications:              High risk colon cancer surveillance: Personal                            history of non-advanced adenoma. Also, brother with                            a history of colon cancer. Previous examinations                            2002, 2005, 2010, 2015 Medicines:                Monitored Anesthesia Care Procedure:                Pre-Anesthesia Assessment:                           - Prior to the procedure, a History and Physical                            was performed, and patient medications and                            allergies were reviewed. The patient's tolerance of                            previous anesthesia was also reviewed. The risks                            and benefits of the procedure and the sedation                            options and risks were discussed with the patient.                            All questions were answered, and informed consent                            was obtained. Prior Anticoagulants: The patient has                            taken no previous anticoagulant or antiplatelet                            agents. ASA Grade Assessment: II - A patient with                            mild systemic disease. After reviewing the risks  and benefits, the patient was deemed in                            satisfactory condition to undergo the procedure.                           After obtaining informed consent, the colonoscope                            was passed under direct vision. Throughout the                            procedure, the patient's blood pressure, pulse, and                            oxygen  saturations were monitored continuously. The                            Colonoscope was introduced through the anus and                            advanced to the the cecum, identified by                            appendiceal orifice and ileocecal valve. The                            ileocecal valve, appendiceal orifice, and rectum                            were photographed. The quality of the bowel                            preparation was excellent. The colonoscopy was                            performed without difficulty. The patient tolerated                            the procedure well. The bowel preparation used was                            SUPREP via split dose instruction. Scope In: 10:50:00 AM Scope Out: 11:01:26 AM Scope Withdrawal Time: 0 hours 9 minutes 58 seconds  Total Procedure Duration: 0 hours 11 minutes 26 seconds  Findings:                 Two polyps were found in the ascending colon. The                            polyps were 1 to 3 mm in size. These polyps were                            removed with a cold snare. Resection  and retrieval                            were complete.                           Internal hemorrhoids were found during                            retroflexion. There was evidence of prior                            hemorrhoidal banding as manifested by scarring in                            the distal rectum (3 areas)                           The exam was otherwise without abnormality on                            direct and retroflexion views. Complications:            No immediate complications. Estimated blood loss:                            None. Estimated Blood Loss:     Estimated blood loss: none. Impression:               - Two 1 to 3 mm polyps in the ascending colon,                            removed with a cold snare. Resected and retrieved.                           - Internal hemorrhoids.                           - The  examination was otherwise normal on direct                            and retroflexion views. Recommendation:           - Repeat colonoscopy in 5 years for surveillance                            (personal and family history).                           - Patient has a contact number available for                            emergencies. The signs and symptoms of potential                            delayed complications were discussed with the  patient. Return to normal activities tomorrow.                            Written discharge instructions were provided to the                            patient.                           - Resume previous diet.                           - Continue present medications.                           - Await pathology results. Docia Chuck. Henrene Pastor, MD 07/06/2019 11:08:14 AM This report has been signed electronically.

## 2019-07-06 NOTE — Progress Notes (Signed)
Pt's states no medical or surgical changes since previsit or office visit. 

## 2019-07-08 ENCOUNTER — Telehealth: Payer: Self-pay

## 2019-07-08 ENCOUNTER — Encounter: Payer: Self-pay | Admitting: Internal Medicine

## 2019-07-08 NOTE — Telephone Encounter (Signed)
No answer, left message to call if having any issues or concerns, B.Teiana Hajduk RN 

## 2019-07-08 NOTE — Telephone Encounter (Signed)
No answer, left message to call back later today, B.Talmage Teaster RN. 

## 2019-07-14 DIAGNOSIS — M25512 Pain in left shoulder: Secondary | ICD-10-CM | POA: Diagnosis not present

## 2019-07-18 DIAGNOSIS — M25512 Pain in left shoulder: Secondary | ICD-10-CM | POA: Diagnosis not present

## 2019-08-01 DIAGNOSIS — Z1389 Encounter for screening for other disorder: Secondary | ICD-10-CM | POA: Diagnosis not present

## 2019-08-01 DIAGNOSIS — E785 Hyperlipidemia, unspecified: Secondary | ICD-10-CM | POA: Diagnosis not present

## 2019-08-01 DIAGNOSIS — Z0001 Encounter for general adult medical examination with abnormal findings: Secondary | ICD-10-CM | POA: Diagnosis not present

## 2019-08-01 DIAGNOSIS — I10 Essential (primary) hypertension: Secondary | ICD-10-CM | POA: Diagnosis not present

## 2019-08-01 DIAGNOSIS — E663 Overweight: Secondary | ICD-10-CM | POA: Diagnosis not present

## 2019-08-01 DIAGNOSIS — R42 Dizziness and giddiness: Secondary | ICD-10-CM | POA: Diagnosis not present

## 2019-08-01 DIAGNOSIS — Z6826 Body mass index (BMI) 26.0-26.9, adult: Secondary | ICD-10-CM | POA: Diagnosis not present

## 2019-08-10 DIAGNOSIS — M75112 Incomplete rotator cuff tear or rupture of left shoulder, not specified as traumatic: Secondary | ICD-10-CM | POA: Diagnosis not present

## 2019-08-10 DIAGNOSIS — M7552 Bursitis of left shoulder: Secondary | ICD-10-CM | POA: Diagnosis not present

## 2019-08-10 DIAGNOSIS — M7542 Impingement syndrome of left shoulder: Secondary | ICD-10-CM | POA: Diagnosis not present

## 2019-08-10 DIAGNOSIS — M7522 Bicipital tendinitis, left shoulder: Secondary | ICD-10-CM | POA: Diagnosis not present

## 2019-08-10 DIAGNOSIS — M24112 Other articular cartilage disorders, left shoulder: Secondary | ICD-10-CM | POA: Diagnosis not present

## 2019-08-10 DIAGNOSIS — S46012A Strain of muscle(s) and tendon(s) of the rotator cuff of left shoulder, initial encounter: Secondary | ICD-10-CM | POA: Diagnosis not present

## 2019-08-16 DIAGNOSIS — M25512 Pain in left shoulder: Secondary | ICD-10-CM | POA: Diagnosis not present

## 2019-08-23 DIAGNOSIS — M25512 Pain in left shoulder: Secondary | ICD-10-CM | POA: Diagnosis not present

## 2019-08-25 DIAGNOSIS — M25512 Pain in left shoulder: Secondary | ICD-10-CM | POA: Diagnosis not present

## 2019-08-30 DIAGNOSIS — M25512 Pain in left shoulder: Secondary | ICD-10-CM | POA: Diagnosis not present

## 2019-09-01 DIAGNOSIS — M25512 Pain in left shoulder: Secondary | ICD-10-CM | POA: Diagnosis not present

## 2019-09-06 DIAGNOSIS — M25512 Pain in left shoulder: Secondary | ICD-10-CM | POA: Diagnosis not present

## 2019-09-08 DIAGNOSIS — M25512 Pain in left shoulder: Secondary | ICD-10-CM | POA: Diagnosis not present

## 2019-09-13 DIAGNOSIS — M25512 Pain in left shoulder: Secondary | ICD-10-CM | POA: Diagnosis not present

## 2019-09-15 DIAGNOSIS — M25512 Pain in left shoulder: Secondary | ICD-10-CM | POA: Diagnosis not present

## 2019-09-20 DIAGNOSIS — M25512 Pain in left shoulder: Secondary | ICD-10-CM | POA: Diagnosis not present

## 2019-09-21 DIAGNOSIS — I10 Essential (primary) hypertension: Secondary | ICD-10-CM | POA: Diagnosis not present

## 2019-09-21 DIAGNOSIS — E7849 Other hyperlipidemia: Secondary | ICD-10-CM | POA: Diagnosis not present

## 2019-09-21 DIAGNOSIS — E291 Testicular hypofunction: Secondary | ICD-10-CM | POA: Diagnosis not present

## 2019-09-21 DIAGNOSIS — F4312 Post-traumatic stress disorder, chronic: Secondary | ICD-10-CM | POA: Diagnosis not present

## 2019-09-22 DIAGNOSIS — M25512 Pain in left shoulder: Secondary | ICD-10-CM | POA: Diagnosis not present

## 2019-09-27 DIAGNOSIS — M25512 Pain in left shoulder: Secondary | ICD-10-CM | POA: Diagnosis not present

## 2019-09-29 DIAGNOSIS — M25512 Pain in left shoulder: Secondary | ICD-10-CM | POA: Diagnosis not present

## 2019-10-04 DIAGNOSIS — M25512 Pain in left shoulder: Secondary | ICD-10-CM | POA: Diagnosis not present

## 2019-10-20 ENCOUNTER — Other Ambulatory Visit: Payer: Self-pay | Admitting: Physician Assistant

## 2019-10-21 DIAGNOSIS — E7849 Other hyperlipidemia: Secondary | ICD-10-CM | POA: Diagnosis not present

## 2019-10-21 DIAGNOSIS — F4312 Post-traumatic stress disorder, chronic: Secondary | ICD-10-CM | POA: Diagnosis not present

## 2019-10-21 DIAGNOSIS — I10 Essential (primary) hypertension: Secondary | ICD-10-CM | POA: Diagnosis not present

## 2019-10-21 DIAGNOSIS — E291 Testicular hypofunction: Secondary | ICD-10-CM | POA: Diagnosis not present

## 2019-11-15 ENCOUNTER — Telehealth: Payer: Self-pay | Admitting: Cardiology

## 2019-11-15 NOTE — Telephone Encounter (Signed)
Patient thinks his Amlodipine is the cause of it.

## 2019-11-16 MED ORDER — AMLODIPINE BESYLATE 2.5 MG PO TABS: 2.5000 mg | ORAL_TABLET | Freq: Every day | ORAL | 3 refills | Status: DC

## 2019-11-16 NOTE — Telephone Encounter (Signed)
Patient agrees to reduce amlodipine dose to 2.5 mg daily and f/u with pcp

## 2019-11-16 NOTE — Telephone Encounter (Signed)
I will forward to Dr.McDowell °

## 2019-11-16 NOTE — Telephone Encounter (Signed)
Swelling is a potential side effect of amlodipine.  Would consider cutting the dose from 5 mg daily to 2.5 mg daily.  Track blood pressure and follow-up with PCP to make sure no other adjustments need to be made.

## 2019-12-20 DIAGNOSIS — H903 Sensorineural hearing loss, bilateral: Secondary | ICD-10-CM | POA: Diagnosis not present

## 2019-12-20 DIAGNOSIS — R42 Dizziness and giddiness: Secondary | ICD-10-CM | POA: Diagnosis not present

## 2019-12-20 DIAGNOSIS — H8102 Meniere's disease, left ear: Secondary | ICD-10-CM | POA: Diagnosis not present

## 2019-12-21 DIAGNOSIS — E291 Testicular hypofunction: Secondary | ICD-10-CM | POA: Diagnosis not present

## 2019-12-21 DIAGNOSIS — I10 Essential (primary) hypertension: Secondary | ICD-10-CM | POA: Diagnosis not present

## 2019-12-21 DIAGNOSIS — F4312 Post-traumatic stress disorder, chronic: Secondary | ICD-10-CM | POA: Diagnosis not present

## 2019-12-21 DIAGNOSIS — E7849 Other hyperlipidemia: Secondary | ICD-10-CM | POA: Diagnosis not present

## 2020-01-30 DIAGNOSIS — R42 Dizziness and giddiness: Secondary | ICD-10-CM | POA: Diagnosis not present

## 2020-01-30 DIAGNOSIS — R0902 Hypoxemia: Secondary | ICD-10-CM | POA: Diagnosis not present

## 2020-01-30 DIAGNOSIS — I499 Cardiac arrhythmia, unspecified: Secondary | ICD-10-CM | POA: Diagnosis not present

## 2020-01-30 DIAGNOSIS — R112 Nausea with vomiting, unspecified: Secondary | ICD-10-CM | POA: Diagnosis not present

## 2020-01-30 DIAGNOSIS — H8193 Unspecified disorder of vestibular function, bilateral: Secondary | ICD-10-CM | POA: Diagnosis not present

## 2020-01-30 DIAGNOSIS — I10 Essential (primary) hypertension: Secondary | ICD-10-CM | POA: Diagnosis not present

## 2020-02-10 DIAGNOSIS — H8102 Meniere's disease, left ear: Secondary | ICD-10-CM | POA: Diagnosis not present

## 2020-02-10 DIAGNOSIS — R42 Dizziness and giddiness: Secondary | ICD-10-CM | POA: Diagnosis not present

## 2020-02-10 DIAGNOSIS — H903 Sensorineural hearing loss, bilateral: Secondary | ICD-10-CM | POA: Diagnosis not present

## 2020-02-10 DIAGNOSIS — H6982 Other specified disorders of Eustachian tube, left ear: Secondary | ICD-10-CM | POA: Diagnosis not present

## 2020-02-16 DIAGNOSIS — H6522 Chronic serous otitis media, left ear: Secondary | ICD-10-CM | POA: Diagnosis not present

## 2020-02-16 DIAGNOSIS — H6982 Other specified disorders of Eustachian tube, left ear: Secondary | ICD-10-CM | POA: Diagnosis not present

## 2020-02-21 DIAGNOSIS — E291 Testicular hypofunction: Secondary | ICD-10-CM | POA: Diagnosis not present

## 2020-02-21 DIAGNOSIS — E7849 Other hyperlipidemia: Secondary | ICD-10-CM | POA: Diagnosis not present

## 2020-02-21 DIAGNOSIS — I10 Essential (primary) hypertension: Secondary | ICD-10-CM | POA: Diagnosis not present

## 2020-02-21 DIAGNOSIS — F4312 Post-traumatic stress disorder, chronic: Secondary | ICD-10-CM | POA: Diagnosis not present

## 2020-03-01 ENCOUNTER — Other Ambulatory Visit: Payer: Self-pay | Admitting: Cardiology

## 2020-03-19 DIAGNOSIS — H8102 Meniere's disease, left ear: Secondary | ICD-10-CM | POA: Diagnosis not present

## 2020-03-19 DIAGNOSIS — H7202 Central perforation of tympanic membrane, left ear: Secondary | ICD-10-CM | POA: Diagnosis not present

## 2020-03-19 DIAGNOSIS — R42 Dizziness and giddiness: Secondary | ICD-10-CM | POA: Diagnosis not present

## 2020-03-19 DIAGNOSIS — H903 Sensorineural hearing loss, bilateral: Secondary | ICD-10-CM | POA: Diagnosis not present

## 2020-03-22 DIAGNOSIS — F4312 Post-traumatic stress disorder, chronic: Secondary | ICD-10-CM | POA: Diagnosis not present

## 2020-03-22 DIAGNOSIS — I10 Essential (primary) hypertension: Secondary | ICD-10-CM | POA: Diagnosis not present

## 2020-03-22 DIAGNOSIS — E7849 Other hyperlipidemia: Secondary | ICD-10-CM | POA: Diagnosis not present

## 2020-03-22 DIAGNOSIS — E291 Testicular hypofunction: Secondary | ICD-10-CM | POA: Diagnosis not present

## 2020-04-17 DIAGNOSIS — Z23 Encounter for immunization: Secondary | ICD-10-CM | POA: Diagnosis not present

## 2020-04-20 DIAGNOSIS — H6123 Impacted cerumen, bilateral: Secondary | ICD-10-CM | POA: Diagnosis not present

## 2020-04-20 DIAGNOSIS — R42 Dizziness and giddiness: Secondary | ICD-10-CM | POA: Diagnosis not present

## 2020-04-20 DIAGNOSIS — H8112 Benign paroxysmal vertigo, left ear: Secondary | ICD-10-CM | POA: Diagnosis not present

## 2020-04-20 DIAGNOSIS — H8102 Meniere's disease, left ear: Secondary | ICD-10-CM | POA: Diagnosis not present

## 2020-04-26 ENCOUNTER — Other Ambulatory Visit: Payer: Self-pay | Admitting: Physician Assistant

## 2020-05-14 DIAGNOSIS — H8102 Meniere's disease, left ear: Secondary | ICD-10-CM | POA: Diagnosis not present

## 2020-05-14 DIAGNOSIS — R42 Dizziness and giddiness: Secondary | ICD-10-CM | POA: Diagnosis not present

## 2020-05-22 DIAGNOSIS — I10 Essential (primary) hypertension: Secondary | ICD-10-CM | POA: Diagnosis not present

## 2020-05-22 DIAGNOSIS — E7849 Other hyperlipidemia: Secondary | ICD-10-CM | POA: Diagnosis not present

## 2020-05-22 DIAGNOSIS — E291 Testicular hypofunction: Secondary | ICD-10-CM | POA: Diagnosis not present

## 2020-05-22 DIAGNOSIS — F4312 Post-traumatic stress disorder, chronic: Secondary | ICD-10-CM | POA: Diagnosis not present

## 2020-08-10 DIAGNOSIS — R7309 Other abnormal glucose: Secondary | ICD-10-CM | POA: Diagnosis not present

## 2020-08-10 DIAGNOSIS — I10 Essential (primary) hypertension: Secondary | ICD-10-CM | POA: Diagnosis not present

## 2020-08-10 DIAGNOSIS — E7849 Other hyperlipidemia: Secondary | ICD-10-CM | POA: Diagnosis not present

## 2020-08-10 DIAGNOSIS — Z1331 Encounter for screening for depression: Secondary | ICD-10-CM | POA: Diagnosis not present

## 2020-08-10 DIAGNOSIS — Z6829 Body mass index (BMI) 29.0-29.9, adult: Secondary | ICD-10-CM | POA: Diagnosis not present

## 2020-08-10 DIAGNOSIS — E785 Hyperlipidemia, unspecified: Secondary | ICD-10-CM | POA: Diagnosis not present

## 2020-08-10 DIAGNOSIS — Z0000 Encounter for general adult medical examination without abnormal findings: Secondary | ICD-10-CM | POA: Diagnosis not present

## 2020-08-10 DIAGNOSIS — Z1389 Encounter for screening for other disorder: Secondary | ICD-10-CM | POA: Diagnosis not present

## 2020-08-10 DIAGNOSIS — R42 Dizziness and giddiness: Secondary | ICD-10-CM | POA: Diagnosis not present

## 2020-08-13 DIAGNOSIS — H903 Sensorineural hearing loss, bilateral: Secondary | ICD-10-CM | POA: Diagnosis not present

## 2020-08-13 DIAGNOSIS — H8102 Meniere's disease, left ear: Secondary | ICD-10-CM | POA: Diagnosis not present

## 2020-08-13 DIAGNOSIS — R42 Dizziness and giddiness: Secondary | ICD-10-CM | POA: Diagnosis not present

## 2020-09-10 ENCOUNTER — Encounter: Payer: Self-pay | Admitting: *Deleted

## 2020-09-10 ENCOUNTER — Ambulatory Visit (INDEPENDENT_AMBULATORY_CARE_PROVIDER_SITE_OTHER): Payer: PPO | Admitting: Cardiology

## 2020-09-10 ENCOUNTER — Encounter: Payer: Self-pay | Admitting: Cardiology

## 2020-09-10 VITALS — BP 140/76 | HR 100 | Wt 216.6 lb

## 2020-09-10 DIAGNOSIS — I25119 Atherosclerotic heart disease of native coronary artery with unspecified angina pectoris: Secondary | ICD-10-CM | POA: Diagnosis not present

## 2020-09-10 DIAGNOSIS — I10 Essential (primary) hypertension: Secondary | ICD-10-CM | POA: Diagnosis not present

## 2020-09-10 NOTE — Progress Notes (Signed)
Cardiology Office Note  Date: 09/10/2020   ID: Andrew, Bates [birth year], MRN [id number]  PCP:  Sharilyn Sites, MD  Cardiologist:  Rozann Lesches, MD Electrophysiologist:  None   Chief Complaint  Patient presents with  . Cardiac follow-up    History of Present Illness: Andrew Bates is a 75 y.o. male last seen in October 2020.  He presents for a follow-up visit.  He is here today with his wife.  Reports intermittent shortness of breath, no definite pattern, no exertional chest pain.  He has gained weight since last year.  He reports having a recent follow-up visit with Dr. Hilma Favors, lab work obtained.  We are requesting the results for review.  I reviewed his current medications which are listed below.  Cardiovascular regimen includes aspirin, Norvasc, losartan, and Crestor.  He has not used any nitroglycerin.  ECG today shows sinus rhythm with PVCs.  He brought in a record of recent outpatient blood pressure and heart rate recordings, I reviewed these today.  Past Medical History:  Diagnosis Date  . Adenomatous colon polyp   . Anxiety   . Arthritis   . Asthma   . Coronary artery disease    Nonobstructive at cardiac catheterization 2020  . Depression   . DVT (deep venous thrombosis) ([place]) 2008  . Essential hypertension   . GERD (gastroesophageal reflux disease)   . History of kidney stones   . Hyperlipidemia   . Internal hemorrhoids   . Peripheral neuropathy   . PTSD (post-traumatic stress disorder)   . Sleep apnea    cpap  . Vertigo     Past Surgical History:  Procedure Laterality Date  . ANTERIOR CERVICAL DECOMP/DISCECTOMY FUSION N/A 07/09/2015   Procedure: ANTERIOR CERVICAL DECOMPRESSION/DISCECTOMY FUSION CERVICAL FIVE-SIX,CERVICAL SIX-SEVEN;  Surgeon: Newman Pies, MD;  Location: [place] [facility];  Service: Neurosurgery;  Laterality: N/A;  . CARPAL TUNNEL RELEASE Bilateral 2014  . COLONOSCOPY    . CYSTOSCOPY/RETROGRADE/URETEROSCOPY/STONE EXTRACTION WITH  BASKET Left 02/14/2014   Procedure: CYSTOSCOPY AND LEFT DOUBLE J STENT PLACEMENT;  ATTEMPTED STONE  EXTRACTION ;  Surgeon: Jorja Loa, MD;  [facility];  Service: Urology;  Laterality: Left;  . ENDOLYMPHATIC [facility] DECOMPRESSION Left 06/14/2019   Procedure: [facility] DECOMPRESSION;  Surgeon: Leta Baptist, MD;  Location: [place];  Service: ENT;  Laterality: Left;  . ESOPHAGOGASTRODUODENOSCOPY ENDOSCOPY    . KNEE SURGERY Right 2006   Arthroscopy - MCl tear  . LEFT HEART CATH AND CORONARY ANGIOGRAPHY N/A 08/17/2018   Procedure: LEFT HEART CATH AND CORONARY ANGIOGRAPHY;  Surgeon: Burnell Blanks, MD;  Location: [place] CV LAB;  Service: Cardiovascular;  Laterality: N/A;  . UPPER GASTROINTESTINAL ENDOSCOPY      Current Outpatient Medications  Medication Sig Dispense Refill  . acetaminophen (TYLENOL) 500 MG tablet Take 500 mg by mouth every 6 (six) hours as needed for fever.    . Artificial Tear Ointment (DRY EYES OP) Apply 1 application to eye as needed (dry eyes).     Marland Kitchen aspirin EC 81 MG EC tablet Take 1 tablet (81 mg total) by mouth daily.    . Biotin 5 MG CAPS Take 10 mg by mouth daily.     Marland Kitchen buPROPion (WELLBUTRIN XL) 150 MG 24 hr tablet Take 150 mg by mouth daily.    . busPIRone (BUSPAR) 10 MG tablet Take 10 mg by mouth 2 (two) times daily. anxiety    . fluorometholone (FML) 0.1 % ophthalmic suspension Place  1 drop into both eyes 3 (three) times daily.     Marland Kitchen losartan (COZAAR) 50 MG tablet TAKE 1 TABLET BY MOUTH TWICE A DAILY. 180 tablet 1  . mirtazapine (REMERON) 45 MG tablet Take 45 mg by mouth at bedtime.    . nitroGLYCERIN (NITROSTAT) 0.4 MG SL tablet Place 1 tablet (0.4 mg total) under the tongue every 5 (five) minutes x 3 doses as needed for chest pain. 25 tablet 0  . omeprazole (PRILOSEC) 20 MG capsule Take 20 mg by mouth every morning.     . ondansetron (ZOFRAN ODT) 4 MG disintegrating tablet Take 1 tablet (4 mg total) by mouth every 8 (eight)  hours as needed for nausea or vomiting. 20 tablet 0  . promethazine (PHENERGAN) 25 MG tablet Take 25 mg by mouth every 6 (six) hours as needed.    . psyllium (METAMUCIL) 58.6 % powder Take 1 packet by mouth daily.     . rosuvastatin (CRESTOR) 20 MG tablet TAKE 1 TABLET BY MOUTH AT 6PM. 90 tablet 3  . amLODipine (NORVASC) 2.5 MG tablet Take 1 tablet (2.5 mg total) by mouth daily. 180 tablet 3   No current facility-administered medications for this visit.   Allergies:  Other, Tamsulosin, Codeine, Morphine and related, Metoprolol, Oxybutynin, Pentazocine-naloxone hcl, Sertraline, and Topiramate   ROS: No syncope.  Physical Exam: VS:  BP 140/76 (BP Location: Right Arm, Patient Position: Sitting, Cuff Size: Normal)   Pulse 100   Wt 216 lb 9.6 oz (98.2 kg)   SpO2 95%   BMI 28.58 kg/m , BMI Body mass index is 28.58 kg/m.  Wt Readings from Last 3 Encounters:  09/10/20 216 lb 9.6 oz (98.2 kg)  07/06/19 194 lb 6.4 oz (88.2 kg)  06/22/19 194 lb 6.4 oz (88.2 kg)    General: Patient appears comfortable at rest. HEENT: Conjunctiva and lids normal, wearing a mask Neck: Supple, no elevated JVP or carotid bruits, no thyromegaly. Lungs: Clear to auscultation, nonlabored breathing at rest. Cardiac: Regular rate and rhythm with occasional ectopy, no S3 or significant systolic murmur, no pericardial rub. Extremities: No pitting edema, .  ECG:  An ECG dated 06/14/2019 was personally reviewed today and demonstrated:  Sinus rhythm with counterclockwise rotation.  Recent Labwork:    Component Value Date/Time   CHOL 177 05/25/2019 0825   TRIG 73 05/25/2019 0825   HDL 84 05/25/2019 0825   CHOLHDL 2.1 05/25/2019 0825   VLDL 15 05/25/2019 0825   LDLCALC 78 05/25/2019 0825    Other Studies Reviewed Today:  Cardiac catheterization 08/17/2018:  Ost Cx to Prox Cx lesion is 20% stenosed.  Dist LM to Ost LAD lesion is 50% stenosed.  Prox LAD lesion is 30% stenosed.  Prox LAD to Mid LAD lesion is  20% stenosed.  The left ventricular systolic function is normal.  LV end diastolic pressure is normal.  The left ventricular ejection fraction is 45-50% by visual estimate.  There is no mitral valve regurgitation.  1. Moderate (40-50%) ostial LAD stenosis. This is a smooth lesion and does not appear to be flow limiting.  2. Mild non-obstructive disease in the proximal Circumflex artery 3. Large dominant RCA with no obvious plaque.  4. Mild segmental LV systolic dysfunction, MEQA=83-41%. Apical hypokinesis.   Recommendations: Medical management of CAD. I do not the in the smooth, moderate narrowing of the ostial/proximal LAD is causing his chest pain. Echo in am to better assess LV systolic function.   Echocardiogram 08/18/2018: 1. The left ventricle  has normal systolic function with an ejection fraction of 60-65%. The cavity size was mildly dilated. Left ventricular diastolic parameters were normal. 2. The mitral valve is normal in structure. 3. The tricuspid valve is normal in structure. 4. The aortic valve is tricuspid Mild calcification of the aortic valve. Aortic valve regurgitation is mild by color flow Doppler. 5. The pulmonic valve was normal in structure. 6. There is mild dilatation of the aortic root measuring 40 mm.  Cardiac monitor 05/09/2019: Zio patch reviewed, 7 days 8 hours analyzed.  Predominant rhythm is sinus.  Heart rate ranged from 45 bpm up to 145 bpm with average heart rate 66 bpm.  There were brief runs of SVT, the longest lasting 9 beats and the fastest heart rate 154 (only 5 beats).  Rare isolated PACs were noted.  There were occasional PVCs representing 1.6% of total beats.  Ventricular trigeminy also noted.  Assessment and Plan:  1.  History of mild to moderate CAD by cardiac catheterization in 2020. No definite angina symptoms with intermittent history of shortness of breath, not necessarily progressive.  He has gained a significant amount of weight  since last year, I talked with him about weight loss.  He reports compliance with his medical therapy.  Requesting interval lab work from Dr. Hilma Favors for review.  Would otherwise continue aspirin, Norvasc, losartan, and Crestor for risk reduction.  2.  PVCs noted on ECG.  Prior cardiac monitoring also showed atrial and ventricular ectopy.  He had been on Lopressor in the past.  If palpitations become a symptomatic issue, could consider resuming low-dose beta-blocker.  3.  Essential hypertension, no changes made to current regimen including losartan and Norvasc.  Medication Adjustments/Labs and Tests Ordered: Current medicines are reviewed at length with the patient today.  Concerns regarding medicines are outlined above.   Tests Ordered: Orders Placed This Encounter  Procedures  . EKG 12-Lead    Medication Changes: No orders of the defined types were placed in this encounter.   Disposition:  Follow up 1 year in the [place] office.  Signed, Satira Sark, MD, [facility] 09/10/2020 2:48 PM    [facility] [place], [place], [place] [address] Phone: [phone]; Fax: [phone]

## 2020-09-10 NOTE — Patient Instructions (Addendum)
Medication Instructions:   Your physician recommends that you continue on your current medications as directed. Please refer to the Current Medication list given to you today.  Labwork:  none  Testing/Procedures:  none  Follow-Up:  Your physician recommends that you schedule a follow-up appointment in: 1 year in [place]. You will receive a reminder letter in the mail in about 10 months reminding you to call and schedule your appointment. If you don't receive this letter, please contact our office.  Any Other Special Instructions Will Be Listed Below (If Applicable).  If you need a refill on your cardiac medications before your next appointment, please call your pharmacy.

## 2020-09-19 DIAGNOSIS — E7849 Other hyperlipidemia: Secondary | ICD-10-CM | POA: Diagnosis not present

## 2020-09-19 DIAGNOSIS — I10 Essential (primary) hypertension: Secondary | ICD-10-CM | POA: Diagnosis not present

## 2020-10-20 DIAGNOSIS — E7849 Other hyperlipidemia: Secondary | ICD-10-CM | POA: Diagnosis not present

## 2020-10-20 DIAGNOSIS — I10 Essential (primary) hypertension: Secondary | ICD-10-CM | POA: Diagnosis not present

## 2020-10-29 DIAGNOSIS — D225 Melanocytic nevi of trunk: Secondary | ICD-10-CM | POA: Diagnosis not present

## 2020-10-29 DIAGNOSIS — L821 Other seborrheic keratosis: Secondary | ICD-10-CM | POA: Diagnosis not present

## 2020-10-29 DIAGNOSIS — L570 Actinic keratosis: Secondary | ICD-10-CM | POA: Diagnosis not present

## 2020-10-29 DIAGNOSIS — X32XXXD Exposure to sunlight, subsequent encounter: Secondary | ICD-10-CM | POA: Diagnosis not present

## 2020-11-05 ENCOUNTER — Other Ambulatory Visit: Payer: Self-pay | Admitting: Cardiology

## 2020-11-12 DIAGNOSIS — R42 Dizziness and giddiness: Secondary | ICD-10-CM | POA: Diagnosis not present

## 2020-11-12 DIAGNOSIS — H903 Sensorineural hearing loss, bilateral: Secondary | ICD-10-CM | POA: Diagnosis not present

## 2020-11-12 DIAGNOSIS — H8102 Meniere's disease, left ear: Secondary | ICD-10-CM | POA: Diagnosis not present

## 2020-11-20 DIAGNOSIS — E7849 Other hyperlipidemia: Secondary | ICD-10-CM | POA: Diagnosis not present

## 2020-11-20 DIAGNOSIS — I10 Essential (primary) hypertension: Secondary | ICD-10-CM | POA: Diagnosis not present

## 2020-12-20 DIAGNOSIS — I10 Essential (primary) hypertension: Secondary | ICD-10-CM | POA: Diagnosis not present

## 2020-12-20 DIAGNOSIS — E782 Mixed hyperlipidemia: Secondary | ICD-10-CM | POA: Diagnosis not present

## 2021-01-20 DIAGNOSIS — I10 Essential (primary) hypertension: Secondary | ICD-10-CM | POA: Diagnosis not present

## 2021-01-20 DIAGNOSIS — E782 Mixed hyperlipidemia: Secondary | ICD-10-CM | POA: Diagnosis not present

## 2021-03-12 ENCOUNTER — Other Ambulatory Visit: Payer: Self-pay | Admitting: Physician Assistant

## 2021-04-11 ENCOUNTER — Other Ambulatory Visit: Payer: Self-pay | Admitting: Cardiology

## 2021-05-08 ENCOUNTER — Other Ambulatory Visit: Payer: Self-pay | Admitting: Cardiology

## 2021-05-13 DIAGNOSIS — R42 Dizziness and giddiness: Secondary | ICD-10-CM | POA: Diagnosis not present

## 2021-05-13 DIAGNOSIS — H903 Sensorineural hearing loss, bilateral: Secondary | ICD-10-CM | POA: Diagnosis not present

## 2021-05-13 DIAGNOSIS — H6123 Impacted cerumen, bilateral: Secondary | ICD-10-CM | POA: Diagnosis not present

## 2021-05-13 DIAGNOSIS — H8102 Meniere's disease, left ear: Secondary | ICD-10-CM | POA: Diagnosis not present

## 2021-06-21 ENCOUNTER — Encounter (HOSPITAL_COMMUNITY): Payer: Self-pay | Admitting: Emergency Medicine

## 2021-06-21 ENCOUNTER — Telehealth: Payer: Self-pay | Admitting: Cardiology

## 2021-06-21 ENCOUNTER — Other Ambulatory Visit: Payer: Self-pay

## 2021-06-21 ENCOUNTER — Inpatient Hospital Stay (HOSPITAL_COMMUNITY)
Admission: EM | Admit: 2021-06-21 | Discharge: 2021-06-26 | DRG: 287 | Disposition: A | Discharge status: Home / Self Care | Payer: No Typology Code available for payment source | Attending: Cardiovascular Disease | Admitting: Cardiovascular Disease

## 2021-06-21 ENCOUNTER — Emergency Department (HOSPITAL_COMMUNITY): Payer: No Typology Code available for payment source

## 2021-06-21 DIAGNOSIS — E782 Mixed hyperlipidemia: Secondary | ICD-10-CM | POA: Diagnosis present

## 2021-06-21 DIAGNOSIS — I129 Hypertensive chronic kidney disease with stage 1 through stage 4 chronic kidney disease, or unspecified chronic kidney disease: Secondary | ICD-10-CM | POA: Diagnosis present

## 2021-06-21 DIAGNOSIS — R079 Chest pain, unspecified: Secondary | ICD-10-CM

## 2021-06-21 DIAGNOSIS — I493 Ventricular premature depolarization: Secondary | ICD-10-CM | POA: Diagnosis present

## 2021-06-21 DIAGNOSIS — N1831 Chronic kidney disease, stage 3a: Secondary | ICD-10-CM | POA: Diagnosis present

## 2021-06-21 DIAGNOSIS — Z83438 Family history of other disorder of lipoprotein metabolism and other lipidemia: Secondary | ICD-10-CM

## 2021-06-21 DIAGNOSIS — E785 Hyperlipidemia, unspecified: Secondary | ICD-10-CM | POA: Diagnosis present

## 2021-06-21 DIAGNOSIS — Z20822 Contact with and (suspected) exposure to covid-19: Secondary | ICD-10-CM | POA: Diagnosis present

## 2021-06-21 DIAGNOSIS — Z79899 Other long term (current) drug therapy: Secondary | ICD-10-CM

## 2021-06-21 DIAGNOSIS — I2584 Coronary atherosclerosis due to calcified coronary lesion: Secondary | ICD-10-CM | POA: Diagnosis present

## 2021-06-21 DIAGNOSIS — R0789 Other chest pain: Secondary | ICD-10-CM | POA: Diagnosis not present

## 2021-06-21 DIAGNOSIS — I2510 Atherosclerotic heart disease of native coronary artery without angina pectoris: Secondary | ICD-10-CM | POA: Diagnosis present

## 2021-06-21 DIAGNOSIS — K219 Gastro-esophageal reflux disease without esophagitis: Secondary | ICD-10-CM | POA: Diagnosis present

## 2021-06-21 DIAGNOSIS — Z86718 Personal history of other venous thrombosis and embolism: Secondary | ICD-10-CM

## 2021-06-21 DIAGNOSIS — I25110 Atherosclerotic heart disease of native coronary artery with unstable angina pectoris: Secondary | ICD-10-CM | POA: Diagnosis not present

## 2021-06-21 DIAGNOSIS — I200 Unstable angina: Secondary | ICD-10-CM | POA: Diagnosis not present

## 2021-06-21 DIAGNOSIS — Z808 Family history of malignant neoplasm of other organs or systems: Secondary | ICD-10-CM

## 2021-06-21 DIAGNOSIS — G629 Polyneuropathy, unspecified: Secondary | ICD-10-CM | POA: Diagnosis present

## 2021-06-21 DIAGNOSIS — I071 Rheumatic tricuspid insufficiency: Secondary | ICD-10-CM | POA: Diagnosis present

## 2021-06-21 DIAGNOSIS — Z888 Allergy status to other drugs, medicaments and biological substances status: Secondary | ICD-10-CM

## 2021-06-21 DIAGNOSIS — Z981 Arthrodesis status: Secondary | ICD-10-CM

## 2021-06-21 DIAGNOSIS — Z885 Allergy status to narcotic agent status: Secondary | ICD-10-CM

## 2021-06-21 DIAGNOSIS — F419 Anxiety disorder, unspecified: Secondary | ICD-10-CM | POA: Diagnosis present

## 2021-06-21 DIAGNOSIS — Z8052 Family history of malignant neoplasm of bladder: Secondary | ICD-10-CM

## 2021-06-21 DIAGNOSIS — Z87442 Personal history of urinary calculi: Secondary | ICD-10-CM

## 2021-06-21 DIAGNOSIS — I10 Essential (primary) hypertension: Secondary | ICD-10-CM | POA: Diagnosis present

## 2021-06-21 DIAGNOSIS — R001 Bradycardia, unspecified: Secondary | ICD-10-CM | POA: Diagnosis present

## 2021-06-21 DIAGNOSIS — Z87891 Personal history of nicotine dependence: Secondary | ICD-10-CM

## 2021-06-21 DIAGNOSIS — Z8371 Family history of colonic polyps: Secondary | ICD-10-CM

## 2021-06-21 DIAGNOSIS — Z7982 Long term (current) use of aspirin: Secondary | ICD-10-CM

## 2021-06-21 DIAGNOSIS — Z801 Family history of malignant neoplasm of trachea, bronchus and lung: Secondary | ICD-10-CM

## 2021-06-21 DIAGNOSIS — Z8249 Family history of ischemic heart disease and other diseases of the circulatory system: Secondary | ICD-10-CM

## 2021-06-21 DIAGNOSIS — Z800 Family history of malignant neoplasm of digestive organs: Secondary | ICD-10-CM

## 2021-06-21 DIAGNOSIS — N1832 Chronic kidney disease, stage 3b: Secondary | ICD-10-CM | POA: Diagnosis present

## 2021-06-21 DIAGNOSIS — Z86010 Personal history of colonic polyps: Secondary | ICD-10-CM

## 2021-06-21 DIAGNOSIS — G4730 Sleep apnea, unspecified: Secondary | ICD-10-CM | POA: Diagnosis present

## 2021-06-21 LAB — BASIC METABOLIC PANEL
Anion gap: 7 (ref 5–15)
BUN: 24 mg/dL — ABNORMAL HIGH (ref 8–23)
CO2: 21 mmol/L — ABNORMAL LOW (ref 22–32)
Calcium: 8.8 mg/dL — ABNORMAL LOW (ref 8.9–10.3)
Chloride: 107 mmol/L (ref 98–111)
Creatinine, Ser: 1.5 mg/dL — ABNORMAL HIGH (ref 0.61–1.24)
GFR, Estimated: 49 mL/min — ABNORMAL LOW (ref >60)
Glucose, Bld: 108 mg/dL — ABNORMAL HIGH (ref 70–99)
Potassium: 3.6 mmol/L (ref 3.5–5.1)
Sodium: 135 mmol/L (ref 135–145)

## 2021-06-21 LAB — CBC
HCT: 46.2 % (ref 39.0–52.0)
Hemoglobin: 15.1 g/dL (ref 13.0–17.0)
MCH: 32.7 pg (ref 26.0–34.0)
MCHC: 32.7 g/dL (ref 30.0–36.0)
MCV: 100 fL (ref 80.0–100.0)
Platelets: 209 10*3/uL (ref 150–400)
RBC: 4.62 MIL/uL (ref 4.22–5.81)
RDW: 13 % (ref 11.5–15.5)
WBC: 9.7 10*3/uL (ref 4.0–10.5)
nRBC: 0 % (ref 0.0–0.2)

## 2021-06-21 LAB — TROPONIN I (HIGH SENSITIVITY)
Troponin I (High Sensitivity): 10 ng/L (ref <18)
Troponin I (High Sensitivity): 8 ng/L (ref <18)

## 2021-06-21 NOTE — ED Triage Notes (Signed)
Pt endorses left sided chest pressure that started yesterday that radiates to left shoulder and to elbow. Pain worsened today. Pt took 81 mg ASA and 4 mg zofran.

## 2021-06-21 NOTE — Telephone Encounter (Signed)
Pt c/o of Chest Pain: STAT if CP now or developed within 24 hours  1. Are you having CP right now? No   2. Are you experiencing any other symptoms (ex. SOB, nausea, vomiting, sweating)? no  3. How long have you been experiencing CP? A few days   4. Is your CP continuous or coming and going? Continuous   5. Have you taken Nitroglycerin? Yes  ?

## 2021-06-21 NOTE — Telephone Encounter (Signed)
Patient c/o chest pain - has had to take NTG x 3 - does c/o nausea & states that he feels like his core temperature is elevated.    Suggested patient be evaluated in ED now.  Patient verbalized understanding & states he will go to [facility].

## 2021-06-22 ENCOUNTER — Encounter (HOSPITAL_COMMUNITY): Payer: Self-pay | Admitting: Cardiology

## 2021-06-22 ENCOUNTER — Other Ambulatory Visit (HOSPITAL_COMMUNITY): Payer: PPO

## 2021-06-22 ENCOUNTER — Inpatient Hospital Stay (HOSPITAL_COMMUNITY): Payer: No Typology Code available for payment source

## 2021-06-22 DIAGNOSIS — I200 Unstable angina: Secondary | ICD-10-CM | POA: Diagnosis not present

## 2021-06-22 DIAGNOSIS — F419 Anxiety disorder, unspecified: Secondary | ICD-10-CM | POA: Diagnosis present

## 2021-06-22 DIAGNOSIS — R079 Chest pain, unspecified: Secondary | ICD-10-CM | POA: Diagnosis not present

## 2021-06-22 DIAGNOSIS — Z8249 Family history of ischemic heart disease and other diseases of the circulatory system: Secondary | ICD-10-CM | POA: Diagnosis not present

## 2021-06-22 DIAGNOSIS — I10 Essential (primary) hypertension: Secondary | ICD-10-CM | POA: Diagnosis not present

## 2021-06-22 DIAGNOSIS — I25118 Atherosclerotic heart disease of native coronary artery with other forms of angina pectoris: Secondary | ICD-10-CM | POA: Diagnosis not present

## 2021-06-22 DIAGNOSIS — Z885 Allergy status to narcotic agent status: Secondary | ICD-10-CM | POA: Diagnosis not present

## 2021-06-22 DIAGNOSIS — Z20822 Contact with and (suspected) exposure to covid-19: Secondary | ICD-10-CM | POA: Diagnosis present

## 2021-06-22 DIAGNOSIS — Z888 Allergy status to other drugs, medicaments and biological substances status: Secondary | ICD-10-CM | POA: Diagnosis not present

## 2021-06-22 DIAGNOSIS — Z808 Family history of malignant neoplasm of other organs or systems: Secondary | ICD-10-CM | POA: Diagnosis not present

## 2021-06-22 DIAGNOSIS — Z800 Family history of malignant neoplasm of digestive organs: Secondary | ICD-10-CM | POA: Diagnosis not present

## 2021-06-22 DIAGNOSIS — I2584 Coronary atherosclerosis due to calcified coronary lesion: Secondary | ICD-10-CM | POA: Diagnosis present

## 2021-06-22 DIAGNOSIS — Z86718 Personal history of other venous thrombosis and embolism: Secondary | ICD-10-CM | POA: Diagnosis not present

## 2021-06-22 DIAGNOSIS — I25110 Atherosclerotic heart disease of native coronary artery with unstable angina pectoris: Secondary | ICD-10-CM | POA: Diagnosis present

## 2021-06-22 DIAGNOSIS — Z83438 Family history of other disorder of lipoprotein metabolism and other lipidemia: Secondary | ICD-10-CM | POA: Diagnosis not present

## 2021-06-22 DIAGNOSIS — N1831 Chronic kidney disease, stage 3a: Secondary | ICD-10-CM | POA: Diagnosis present

## 2021-06-22 DIAGNOSIS — I129 Hypertensive chronic kidney disease with stage 1 through stage 4 chronic kidney disease, or unspecified chronic kidney disease: Secondary | ICD-10-CM | POA: Diagnosis present

## 2021-06-22 DIAGNOSIS — G4730 Sleep apnea, unspecified: Secondary | ICD-10-CM | POA: Diagnosis present

## 2021-06-22 DIAGNOSIS — Z801 Family history of malignant neoplasm of trachea, bronchus and lung: Secondary | ICD-10-CM | POA: Diagnosis not present

## 2021-06-22 DIAGNOSIS — E782 Mixed hyperlipidemia: Secondary | ICD-10-CM | POA: Diagnosis present

## 2021-06-22 DIAGNOSIS — G629 Polyneuropathy, unspecified: Secondary | ICD-10-CM | POA: Diagnosis present

## 2021-06-22 DIAGNOSIS — Z8052 Family history of malignant neoplasm of bladder: Secondary | ICD-10-CM | POA: Diagnosis not present

## 2021-06-22 DIAGNOSIS — Z8371 Family history of colonic polyps: Secondary | ICD-10-CM | POA: Diagnosis not present

## 2021-06-22 DIAGNOSIS — K219 Gastro-esophageal reflux disease without esophagitis: Secondary | ICD-10-CM | POA: Diagnosis present

## 2021-06-22 DIAGNOSIS — I071 Rheumatic tricuspid insufficiency: Secondary | ICD-10-CM | POA: Diagnosis present

## 2021-06-22 DIAGNOSIS — N1832 Chronic kidney disease, stage 3b: Secondary | ICD-10-CM | POA: Diagnosis not present

## 2021-06-22 DIAGNOSIS — Z86010 Personal history of colonic polyps: Secondary | ICD-10-CM | POA: Diagnosis not present

## 2021-06-22 DIAGNOSIS — E785 Hyperlipidemia, unspecified: Secondary | ICD-10-CM | POA: Diagnosis not present

## 2021-06-22 DIAGNOSIS — R001 Bradycardia, unspecified: Secondary | ICD-10-CM | POA: Diagnosis present

## 2021-06-22 DIAGNOSIS — Z87442 Personal history of urinary calculi: Secondary | ICD-10-CM | POA: Diagnosis not present

## 2021-06-22 LAB — HEMOGLOBIN A1C
Hgb A1c MFr Bld: 5.7 % — ABNORMAL HIGH (ref 4.8–5.6)
Mean Plasma Glucose: 116.89 mg/dL

## 2021-06-22 LAB — TROPONIN I (HIGH SENSITIVITY)
Troponin I (High Sensitivity): 7 ng/L (ref <18)
Troponin I (High Sensitivity): 5 ng/L (ref <18)

## 2021-06-22 LAB — LIPID PANEL
Cholesterol: 112 mg/dL (ref 0–200)
HDL: 41 mg/dL (ref >40)
LDL Cholesterol: 57 mg/dL (ref 0–99)
Total CHOL/HDL Ratio: 2.7 RATIO
Triglycerides: 72 mg/dL (ref <150)
VLDL: 14 mg/dL (ref 0–40)

## 2021-06-22 LAB — ECHOCARDIOGRAM COMPLETE
AR max vel: 2.16 cm2
AV Peak grad: 6.9 mmHg
Ao pk vel: 1.31 m/s
Area-P 1/2: 4.12 cm2
Calc EF: 58.7 %
Height: 74 in
S' Lateral: 3.7 cm
Single Plane A2C EF: 59.2 %
Single Plane A4C EF: 58.4 %
Weight: 3264 oz

## 2021-06-22 LAB — HEPARIN LEVEL (UNFRACTIONATED)
Heparin Unfractionated: 0.31 IU/mL (ref 0.30–0.70)
Heparin Unfractionated: 0.25 IU/mL — ABNORMAL LOW (ref 0.30–0.70)

## 2021-06-22 LAB — MRSA NEXT GEN BY PCR, NASAL: MRSA by PCR Next Gen: NOT DETECTED

## 2021-06-22 LAB — BASIC METABOLIC PANEL
Anion gap: 8 (ref 5–15)
BUN: 21 mg/dL (ref 8–23)
CO2: 22 mmol/L (ref 22–32)
Calcium: 8.6 mg/dL — ABNORMAL LOW (ref 8.9–10.3)
Chloride: 109 mmol/L (ref 98–111)
Creatinine, Ser: 1.47 mg/dL — ABNORMAL HIGH (ref 0.61–1.24)
GFR, Estimated: 50 mL/min — ABNORMAL LOW (ref >60)
Glucose, Bld: 96 mg/dL (ref 70–99)
Potassium: 3.8 mmol/L (ref 3.5–5.1)
Sodium: 139 mmol/L (ref 135–145)

## 2021-06-22 LAB — CBC
HCT: 42.8 % (ref 39.0–52.0)
Hemoglobin: 14.2 g/dL (ref 13.0–17.0)
MCH: 32.5 pg (ref 26.0–34.0)
MCHC: 33.2 g/dL (ref 30.0–36.0)
MCV: 97.9 fL (ref 80.0–100.0)
Platelets: 204 10*3/uL (ref 150–400)
RBC: 4.37 MIL/uL (ref 4.22–5.81)
RDW: 13.1 % (ref 11.5–15.5)
WBC: 7.8 10*3/uL (ref 4.0–10.5)
nRBC: 0 % (ref 0.0–0.2)

## 2021-06-22 LAB — RESP PANEL BY RT-PCR (FLU A&B, COVID) ARPGX2
Influenza A by PCR: NEGATIVE
Influenza B by PCR: NEGATIVE
SARS Coronavirus 2 by RT PCR: NEGATIVE

## 2021-06-22 MED ORDER — MIRTAZAPINE 7.5 MG PO TABS
45.0000 mg | ORAL_TABLET | Freq: Every day | ORAL | Status: DC
  Administered 2021-06-22 – 2021-06-25 (×4): 45 mg via ORAL
  Filled 2021-06-22 (×2): qty 1
  Filled 2021-06-22: qty 6
  Filled 2021-06-22 (×2): qty 1

## 2021-06-22 MED ORDER — NITROGLYCERIN 0.4 MG SL SUBL
0.4000 mg | SUBLINGUAL_TABLET | SUBLINGUAL | Status: DC | PRN
  Administered 2021-06-24 (×2): 0.4 mg via SUBLINGUAL
  Filled 2021-06-22: qty 1

## 2021-06-22 MED ORDER — HEPARIN BOLUS VIA INFUSION
4000.0000 [IU] | Freq: Once | INTRAVENOUS | Status: AC
  Administered 2021-06-22: 4000 [IU] via INTRAVENOUS
  Filled 2021-06-22: qty 4000

## 2021-06-22 MED ORDER — HEPARIN (PORCINE) 25000 UT/250ML-% IV SOLN
1250.0000 [IU]/h | INTRAVENOUS | Status: DC
  Administered 2021-06-22: 1100 [IU]/h via INTRAVENOUS
  Administered 2021-06-25: 1250 [IU]/h via INTRAVENOUS
  Filled 2021-06-22 (×5): qty 250

## 2021-06-22 MED ORDER — PANTOPRAZOLE SODIUM 40 MG PO TBEC
40.0000 mg | DELAYED_RELEASE_TABLET | Freq: Every day | ORAL | Status: DC
  Administered 2021-06-22 – 2021-06-26 (×5): 40 mg via ORAL
  Filled 2021-06-22 (×5): qty 1

## 2021-06-22 MED ORDER — LOSARTAN POTASSIUM 50 MG PO TABS
50.0000 mg | ORAL_TABLET | Freq: Every day | ORAL | Status: AC
  Administered 2021-06-22: 50 mg via ORAL
  Filled 2021-06-22: qty 1

## 2021-06-22 MED ORDER — ROSUVASTATIN CALCIUM 20 MG PO TABS
20.0000 mg | ORAL_TABLET | Freq: Every day | ORAL | Status: DC
  Administered 2021-06-22 – 2021-06-26 (×5): 20 mg via ORAL
  Filled 2021-06-22 (×5): qty 1

## 2021-06-22 MED ORDER — AMLODIPINE BESYLATE 5 MG PO TABS: 2.5000 mg | ORAL_TABLET | Freq: Every day | ORAL | Status: DC

## 2021-06-22 MED ORDER — ASPIRIN EC 81 MG PO TBEC
81.0000 mg | DELAYED_RELEASE_TABLET | Freq: Every day | ORAL | Status: DC
  Administered 2021-06-22 – 2021-06-26 (×5): 81 mg via ORAL
  Filled 2021-06-22 (×5): qty 1

## 2021-06-22 MED ORDER — ACETAMINOPHEN 325 MG PO TABS
650.0000 mg | ORAL_TABLET | ORAL | Status: DC | PRN
  Administered 2021-06-22 – 2021-06-26 (×5): 650 mg via ORAL
  Filled 2021-06-22 (×5): qty 2

## 2021-06-22 MED ORDER — ONDANSETRON HCL 4 MG/2ML IJ SOLN
4.0000 mg | Freq: Four times a day (QID) | INTRAMUSCULAR | Status: DC | PRN
  Administered 2021-06-23 – 2021-06-24 (×2): 4 mg via INTRAVENOUS
  Filled 2021-06-22 (×2): qty 2

## 2021-06-22 MED ORDER — BUSPIRONE HCL 10 MG PO TABS
10.0000 mg | ORAL_TABLET | Freq: Two times a day (BID) | ORAL | Status: DC
  Administered 2021-06-22 – 2021-06-26 (×10): 10 mg via ORAL
  Filled 2021-06-22 (×10): qty 1

## 2021-06-22 MED ORDER — AMLODIPINE BESYLATE 5 MG PO TABS
5.0000 mg | ORAL_TABLET | Freq: Every day | ORAL | Status: DC
  Administered 2021-06-22 – 2021-06-25 (×3): 5 mg via ORAL
  Filled 2021-06-22 (×4): qty 1

## 2021-06-22 MED ORDER — PROMETHAZINE HCL 25 MG PO TABS: 25.0000 mg | ORAL_TABLET | Freq: Four times a day (QID) | ORAL | Status: DC | PRN

## 2021-06-22 MED ORDER — METOPROLOL TARTRATE 12.5 MG HALF TABLET
12.5000 mg | ORAL_TABLET | Freq: Two times a day (BID) | ORAL | Status: DC
  Administered 2021-06-22 – 2021-06-25 (×6): 12.5 mg via ORAL
  Filled 2021-06-22 (×9): qty 1

## 2021-06-22 MED ORDER — BUPROPION HCL ER (XL) 150 MG PO TB24
150.0000 mg | ORAL_TABLET | Freq: Every day | ORAL | Status: DC
  Administered 2021-06-22 – 2021-06-26 (×5): 150 mg via ORAL
  Filled 2021-06-22 (×5): qty 1

## 2021-06-22 MED ORDER — ACETAMINOPHEN 500 MG PO TABS: 500.0000 mg | ORAL_TABLET | Freq: Four times a day (QID) | ORAL | Status: DC | PRN

## 2021-06-22 MED ORDER — AMLODIPINE BESYLATE 5 MG PO TABS
5.0000 mg | ORAL_TABLET | Freq: Once | ORAL | Status: AC
  Administered 2021-06-22: 5 mg via ORAL
  Filled 2021-06-22: qty 1

## 2021-06-22 MED ORDER — BIOTIN 5 MG PO CAPS: 10.0000 mg | ORAL_CAPSULE | Freq: Every day | ORAL | Status: DC

## 2021-06-22 MED ORDER — ONDANSETRON 4 MG PO TBDP: 4.0000 mg | ORAL_TABLET | Freq: Three times a day (TID) | ORAL | Status: DC | PRN

## 2021-06-22 MED ORDER — PROMETHAZINE HCL 25 MG PO TABS
25.0000 mg | ORAL_TABLET | Freq: Four times a day (QID) | ORAL | Status: DC | PRN
  Administered 2021-06-24 – 2021-06-26 (×2): 25 mg via ORAL
  Filled 2021-06-22 (×2): qty 1

## 2021-06-22 MED ORDER — ASPIRIN 81 MG PO CHEW
324.0000 mg | CHEWABLE_TABLET | Freq: Once | ORAL | Status: AC
  Administered 2021-06-22: 324 mg via ORAL
  Filled 2021-06-22: qty 4

## 2021-06-22 MED ORDER — FLUOROMETHOLONE 0.1 % OP SUSP
1.0000 [drp] | Freq: Three times a day (TID) | OPHTHALMIC | Status: DC
Start: 2021-06-22 — End: 2021-06-26
  Administered 2021-06-22 – 2021-06-26 (×11): 1 [drp] via OPHTHALMIC
  Filled 2021-06-22: qty 5

## 2021-06-22 MED ORDER — HEPARIN BOLUS VIA INFUSION
1000.0000 [IU] | Freq: Once | INTRAVENOUS | Status: AC
  Administered 2021-06-22: 1000 [IU] via INTRAVENOUS
  Filled 2021-06-22: qty 1000

## 2021-06-22 MED ORDER — NITROGLYCERIN 0.4 MG SL SUBL: 0.4000 mg | SUBLINGUAL_TABLET | SUBLINGUAL | Status: DC | PRN

## 2021-06-22 NOTE — Progress Notes (Signed)
Clinically stable.  Stops negative.  Ekg reviewed Will proceed with cath this admission Continue IV heparin over the weekend.  Thompson Grayer MD, [place] 06/22/2021 10:13 AM

## 2021-06-22 NOTE — ED Provider Notes (Signed)
[facility] Emergency Department Provider Note MRN:  [id number]  Arrival date & time: 06/22/21     Chief Complaint   Chest Pain   History of Present Illness   Andrew Bates is a 75 y.o. year-old male with a history of CAD presenting to the ED with chief complaint of chest pain.  2 weeks of intermittent chest pain, mild, described as a pressure, 2 out of 10 in severity.  This evening while sitting comfortably in his recliner pain suddenly became severe, 8 out of 10.  Associated with headache and nausea.  Took 4 nitroglycerin and it did not help.  Denies shortness of breath, no abdominal pain.  Pain has since improved in the waiting room.  Review of Systems  A complete 10 system review of systems was obtained and all systems are negative except as noted in the HPI and PMH.   Patient's Health History    Past Medical History:  Diagnosis Date   Adenomatous colon polyp    Anxiety    Arthritis    Asthma    Coronary artery disease    Nonobstructive at cardiac catheterization 2020   Depression    DVT (deep venous thrombosis) ([place]) 2008   Essential hypertension    GERD (gastroesophageal reflux disease)    History of kidney stones    Hyperlipidemia    Internal hemorrhoids    Peripheral neuropathy    PTSD (post-traumatic stress disorder)    Sleep apnea    cpap   Vertigo     Past Surgical History:  Procedure Laterality Date   ANTERIOR CERVICAL DECOMP/DISCECTOMY FUSION N/A 07/09/2015   Procedure: ANTERIOR CERVICAL DECOMPRESSION/DISCECTOMY FUSION CERVICAL FIVE-SIX,CERVICAL SIX-SEVEN;  Surgeon: Newman Pies, MD;  Location: [place] NEURO ORS;  Service: Neurosurgery;  Laterality: N/A;   CARPAL TUNNEL RELEASE Bilateral 2014   COLONOSCOPY     CYSTOSCOPY/RETROGRADE/URETEROSCOPY/STONE EXTRACTION WITH BASKET Left 02/14/2014   Procedure: CYSTOSCOPY AND LEFT DOUBLE J STENT PLACEMENT;  ATTEMPTED STONE  EXTRACTION ;  Surgeon: Jorja Loa, MD;  [facility];   Service: Urology;  Laterality: Left;   ENDOLYMPHATIC [place] DECOMPRESSION Left 06/14/2019   Procedure: ENDOLYMPHATIC [place];  Surgeon: Leta Baptist, MD;  Location: [place];  Service: ENT;  Laterality: Left;   ESOPHAGOGASTRODUODENOSCOPY ENDOSCOPY     KNEE SURGERY Right 2006   Arthroscopy - MCl tear   LEFT HEART CATH AND CORONARY ANGIOGRAPHY N/A 08/17/2018   Procedure: LEFT HEART CATH AND CORONARY ANGIOGRAPHY;  Surgeon: Burnell Blanks, MD;  Location: [place] CV LAB;  Service: Cardiovascular;  Laterality: N/A;   UPPER GASTROINTESTINAL ENDOSCOPY      Family History  Problem Relation Age of Onset   Hypertension Father    Hyperlipidemia Father    Lung cancer Father        from piece of abestos lodged in his lung   Thyroid cancer Mother    Colon cancer Brother 62   Colon polyps Brother    Bladder Cancer Brother    Esophageal cancer Neg Hx    Stomach cancer Neg Hx    Rectal cancer Neg Hx     Social History   Socioeconomic History   Marital status: Married    Spouse name: [name]   Number of children: 2   Years of education: Not on file   Highest education level: Not on file  Occupational History   Occupation: retired/diabled  Tobacco Use   Smoking status: Former    Types: Landscape architect  Quit date: 16    Years since quitting: 29.0   Smokeless tobacco: Never   Tobacco comments:    quit 15 years ago (2017) never smoked alot  Vaping Use   Vaping Use: Never used  Substance and Sexual Activity   Alcohol use: No   Drug use: No   Sexual activity: Not on file  Other Topics Concern   Not on file  Social History Narrative   Not on file   Social Determinants of Health   Financial Resource Strain: Not on file  Food Insecurity: Not on file  Transportation Needs: Not on file  Physical Activity: Not on file  Stress: Not on file  Social Connections: Not on file  Intimate Partner Violence: Not on file     Physical Exam   Vitals:   06/22/21 0145  06/22/21 0237  BP: 131/84 128/79  Pulse: (!) 49 (!) 44  Resp: 15 15  Temp:  97.8 F (36.6 C)  SpO2: 95% 96%    CONSTITUTIONAL: Well-appearing, NAD NEURO:  Alert and oriented x 3, no focal deficits EYES:  eyes equal and reactive ENT/NECK:  no LAD, no JVD CARDIO: Regular rate, well-perfused, normal S1 and S2 PULM:  CTAB no wheezing or rhonchi GI/GU:  normal bowel sounds, non-distended, non-tender MSK/SPINE:  No gross deformities, no edema SKIN:  no rash, atraumatic PSYCH:  Appropriate speech and behavior  *Additional and/or pertinent findings included in MDM below  Diagnostic and Interventional Summary    EKG Interpretation  Date/Time:  Friday June 21 2021 18:00:09 EST Ventricular Rate:  75 PR Interval:  144 QRS Duration: 114 QT Interval:  418 QTC Calculation: 466 R Axis:   -12 Text Interpretation: Sinus rhythm with frequent Premature ventricular complexes Otherwise normal ECG When compared with ECG of 14-Jun-2019 14:25, PREVIOUS ECG IS PRESENT Confirmed by Gerlene Fee [phone]) on 06/22/2021 1:03:51 AM       Labs Reviewed  BASIC METABOLIC PANEL - Abnormal; Notable for the following components:      Result Value   CO2 21 (*)    Glucose, Bld 108 (*)    BUN 24 (*)    Creatinine, Ser 1.50 (*)    Calcium 8.8 (*)    GFR, Estimated 49 (*)    All other components within normal limits  RESP PANEL BY RT-PCR (FLU A&B, COVID) ARPGX2  CBC  HEPARIN LEVEL (UNFRACTIONATED)  BASIC METABOLIC PANEL  LIPID PANEL  CBC  HEMOGLOBIN A1C  TROPONIN I (HIGH SENSITIVITY)  TROPONIN I (HIGH SENSITIVITY)    DG Chest 2 View  Final Result      Medications  mirtazapine (REMERON) tablet 45 mg (has no administration in time range)  aspirin EC tablet 81 mg (has no administration in time range)  fluorometholone (FML) 0.1 % ophthalmic suspension 1 drop (has no administration in time range)  buPROPion (WELLBUTRIN XL) 24 hr tablet 150 mg (has no administration in time range)  busPIRone  (BUSPAR) tablet 10 mg (10 mg Oral Given 06/22/21 0245)  amLODipine (NORVASC) tablet 5 mg (has no administration in time range)  rosuvastatin (CRESTOR) tablet 20 mg (has no administration in time range)  losartan (COZAAR) tablet 50 mg (has no administration in time range)  pantoprazole (PROTONIX) EC tablet 40 mg (has no administration in time range)  nitroGLYCERIN (NITROSTAT) SL tablet 0.4 mg (has no administration in time range)  acetaminophen (TYLENOL) tablet 650 mg (has no administration in time range)  ondansetron (ZOFRAN) injection 4 mg (has no administration in time range)  metoprolol tartrate (LOPRESSOR) tablet 12.5 mg (has no administration in time range)  heparin ADULT infusion 100 units/mL (25000 units/263mL) (1,100 Units/hr Intravenous New Bag/Given 06/22/21 0213)  promethazine (PHENERGAN) tablet 25 mg (has no administration in time range)  aspirin chewable tablet 324 mg (324 mg Oral Given 06/22/21 0126)  amLODipine (NORVASC) tablet 5 mg (5 mg Oral Given 06/22/21 0128)  heparin bolus via infusion 4,000 Units (4,000 Units Intravenous Bolus from Bag 06/22/21 0214)     Procedures  /  Critical Care .Critical Care Performed by: Maudie Flakes, MD Authorized by: Maudie Flakes, MD   Critical care provider statement:    Critical care time (minutes):  35   Critical care was necessary to treat or prevent imminent or life-threatening deterioration of the following conditions: ACS, unstable angina.   Critical care was time spent personally by me on the following activities:  Development of treatment plan with patient or surrogate, discussions with consultants, evaluation of patient's response to treatment, examination of patient, ordering and review of laboratory studies, ordering and review of radiographic studies, ordering and performing treatments and interventions, pulse oximetry, re-evaluation of patient's condition and review of old charts  ED Course and Medical Decision Making  I  have reviewed the triage vital signs, the nursing notes, and pertinent available records from the EMR.  Listed above are laboratory and imaging tests that I personally ordered, reviewed, and interpreted and then considered in my medical decision making (see below for details).  Concerning chest pain description in this 75 year old male with history of CAD, catheterization report from February 2020 reviewed by me, multivessel disease, no stents as of yet.  EKG without concerning changes, troponin negative x2.  Case discussed with Dr. Toney Rakes of cardiology, plan is to admit to cardiology for likely repeat catheterization.  Starting heparin.       Barth Kirks. Sedonia Small, MD [place] [email or web address]  Final Clinical Impressions(s) / ED Diagnoses     ICD-10-CM   1. Unstable angina (HCC)  I20.0       ED Discharge Orders     None        Discharge Instructions Discussed with and Provided to Patient:   Discharge Instructions   None       Maudie Flakes, MD 06/22/21 [phone]

## 2021-06-22 NOTE — Progress Notes (Addendum)
[place] for Heparin Indication: chest pain/ACS  Allergies  Allergen Reactions   Other Nausea And Vomiting and Other (See Comments)    "STRONG" PAIN MEDICATIONS IN GENERAL: HEADACHE   Tamsulosin Anaphylaxis   Codeine Nausea And Vomiting    REACTION: Headache   Morphine And Related Nausea And Vomiting   Metoprolol Itching   Oxybutynin Swelling   Pentazocine-Naloxone Hcl Nausea And Vomiting   Sertraline Swelling    Swelling in knee 100 mg   Topiramate Other (See Comments)    Headache    Patient Measurements: Height: 6\' 2"  (188 cm) Weight: 92.5 kg (204 lb) IBW/kg (Calculated) : 82.2  Vital Signs: Temp: 98.1 F (36.7 C) (12/31 1119) Temp Source: Oral (12/31 1119) BP: 120/83 (12/31 1119) Pulse Rate: 50 (12/31 1119)  Labs: Recent Labs    06/21/21 1812 06/21/21 2020 06/22/21 0620 06/22/21 0833 06/22/21 0837  HGB 15.1  --  14.2  --   --   HCT 46.2  --  42.8  --   --   PLT 209  --  204  --   --   HEPARINUNFRC  --   --   --  0.31  --   CREATININE 1.50*  --  1.47*  --   --   TROPONINIHS 10 8 7   --  5     Estimated Creatinine Clearance: 51.3 mL/min (A) (by C-G formula based on SCr of 1.47 mg/dL (H)).   Medical History: Past Medical History:  Diagnosis Date   Adenomatous colon polyp    Anxiety    Arthritis    Asthma    Coronary artery disease    Nonobstructive at cardiac catheterization 2020   Depression    DVT (deep venous thrombosis) ([place]) 2008   Essential hypertension    GERD (gastroesophageal reflux disease)    History of kidney stones    Hyperlipidemia    Internal hemorrhoids    Peripheral neuropathy    PTSD (post-traumatic stress disorder)    Sleep apnea    cpap   Vertigo     Medications:   heparin 1,100 Units/hr (06/22/21 0600)    Assessment: 75 y.o. male with chest pain for heparin.  Awaiting cath this admission.  Heparin level at goal this morning.  CBC stable, no overt bleeding or complications  noted.  Goal of Therapy:  Heparin level 0.3-0.7 units/ml Monitor platelets by anticoagulation protocol: Yes   Plan:  Continue IV heparin at 1100 units/hr. Confirm heparin level at 1700 pm. Daily heparin level and CBC. F/u plans for cath.  [place] Crane, Roylene Reason, BCCP Clinical Pharmacist  06/22/2021 1:01 PM   [facility] pharmacy phone numbers are listed on [email or web address]

## 2021-06-22 NOTE — H&P (Addendum)
Cardiology Admission History and Physical:   Patient ID: Andrew Bates MRN: [id number]; DOB: [birth year]   Admission date: 06/21/2021  PCP:  Sharilyn Sites, MD   [facility] Providers Cardiologist:  Rozann Lesches, MD        Chief Complaint:  chest pain  Patient Profile:   Andrew Bates is a 75 y.o. male with Htn, HLD, Non obstructive CAD on 2020 LHC, h/o PVCs, GERD  who is being seen 06/22/2021 for the evaluation of chest pain.  History of Present Illness:   Andrew Bates is a 75 y.o. male with Htn, HLD, Non obstructive CAD on 2020 LHC, h/o PVCs, GERD  who is being seen 06/22/2021 for the evaluation of chest pain.  Patient reports on and off chest pain going on for 1-1/5 months, pressure like sensation but was not worse until yesterday- which got worse radiating to the shoulder and elbow. Today, again the pain returned with much higher intensity needing to take NTGx3 but did not resolve thus came to the ER. He got aspirin and zofran, In the ER and his chest pain had already subsided to 2/10.  EKG: NSR, with PVCs Trops negative x2 Cr is 1.50  Past Medical History:  Diagnosis Date   Adenomatous colon polyp    Anxiety    Arthritis    Asthma    Coronary artery disease    Nonobstructive at cardiac catheterization 2020   Depression    DVT (deep venous thrombosis) ([place]) 2008   Essential hypertension    GERD (gastroesophageal reflux disease)    History of kidney stones    Hyperlipidemia    Internal hemorrhoids    Peripheral neuropathy    PTSD (post-traumatic stress disorder)    Sleep apnea    cpap   Vertigo     Past Surgical History:  Procedure Laterality Date   ANTERIOR CERVICAL DECOMP/DISCECTOMY FUSION N/A 07/09/2015   Procedure: ANTERIOR CERVICAL DECOMPRESSION/DISCECTOMY FUSION CERVICAL FIVE-SIX,CERVICAL SIX-SEVEN;  Surgeon: Newman Pies, MD;  Location: [facility];  Service: Neurosurgery;  Laterality: N/A;   CARPAL TUNNEL RELEASE Bilateral 2014    COLONOSCOPY     CYSTOSCOPY/RETROGRADE/URETEROSCOPY/STONE EXTRACTION WITH BASKET Left 02/14/2014   Procedure: CYSTOSCOPY AND LEFT DOUBLE J STENT PLACEMENT;  ATTEMPTED STONE  EXTRACTION ;  Surgeon: Jorja Loa, MD;  Location: [facility];  Service: Urology;  Laterality: Left;   ENDOLYMPHATIC [place] DECOMPRESSION Left 06/14/2019   Procedure: ENDOLYMPHATIC [place];  Surgeon: Leta Baptist, MD;  Location: [facility];  Service: ENT;  Laterality: Left;   ESOPHAGOGASTRODUODENOSCOPY ENDOSCOPY     KNEE SURGERY Right 2006   Arthroscopy - MCl tear   LEFT HEART CATH AND CORONARY ANGIOGRAPHY N/A 08/17/2018   Procedure: LEFT HEART CATH AND CORONARY ANGIOGRAPHY;  Surgeon: Burnell Blanks, MD;  Location: [facility];  Service: Cardiovascular;  Laterality: N/A;   UPPER GASTROINTESTINAL ENDOSCOPY       Medications Prior to Admission: Prior to Admission medications   Medication Sig Start Date End Date Taking? Authorizing Provider  acetaminophen (TYLENOL) 500 MG tablet Take 500 mg by mouth every 6 (six) hours as needed for fever.    [provider]  amLODipine (NORVASC) 2.5 MG tablet Take 1 tablet (2.5 mg total) by mouth daily. 11/16/19 02/14/20  Satira Sark, MD  amLODipine (NORVASC) 5 MG tablet TAKE 1 TABLET BY MOUTH ONCE A DAY. 03/12/21   Satira Sark, MD  Artificial Tear Ointment (DRY EYES OP) Apply 1 application to eye  as needed (dry eyes).     [provider]  aspirin EC 81 MG EC tablet Take 1 tablet (81 mg total) by mouth daily. 08/19/18   Kathyrn Drown D, NP  Biotin 5 MG CAPS Take 10 mg by mouth daily.     [provider]  buPROPion (WELLBUTRIN XL) 150 MG 24 hr tablet Take 150 mg by mouth daily.    [provider]  busPIRone (BUSPAR) 10 MG tablet Take 10 mg by mouth 2 (two) times daily. anxiety    [provider]  fluorometholone (FML) 0.1 % ophthalmic suspension Place 1 drop into both eyes 3 (three) times daily.   03/29/19   [provider]  losartan (COZAAR) 50 MG tablet TAKE 1 TABLET BY MOUTH TWICE A DAILY. 05/08/21   Satira Sark, MD  mirtazapine (REMERON) 45 MG tablet Take 45 mg by mouth at bedtime.    [provider]  nitroGLYCERIN (NITROSTAT) 0.4 MG SL tablet Place 1 tablet (0.4 mg total) under the tongue every 5 (five) minutes x 3 doses as needed for chest pain. 08/18/18   Kathyrn Drown D, NP  omeprazole (PRILOSEC) 20 MG capsule Take 20 mg by mouth every morning.     [provider]  ondansetron (ZOFRAN ODT) 4 MG disintegrating tablet Take 1 tablet (4 mg total) by mouth every 8 (eight) hours as needed for nausea or vomiting. 04/29/19   Amin, Jeanella Flattery, MD  promethazine (PHENERGAN) 25 MG tablet Take 25 mg by mouth every 6 (six) hours as needed. 06/01/19   [provider]  psyllium (METAMUCIL) 58.6 % powder Take 1 packet by mouth daily.     [provider]  rosuvastatin (CRESTOR) 20 MG tablet TAKE 1 TABLET BY MOUTH AT 6PM. 04/11/21   Satira Sark, MD     Allergies:    Allergies  Allergen Reactions   Other Nausea And Vomiting and Other (See Comments)    "STRONG" PAIN MEDICATIONS IN GENERAL: HEADACHE   Tamsulosin Anaphylaxis   Codeine Nausea And Vomiting    REACTION: Headache   Morphine And Related Nausea And Vomiting   Metoprolol Itching   Oxybutynin Swelling   Pentazocine-Naloxone Hcl Nausea And Vomiting   Sertraline Swelling    Swelling in knee 100 mg   Topiramate Other (See Comments)    Headache    Social History:   Social History   Socioeconomic History   Marital status: Married    Spouse name: [name]   Number of children: 2   Years of education: Not on file   Highest education level: Not on file  Occupational History   Occupation: retired/diabled  Tobacco Use   Smoking status: Former    Types: Cigars    Quit date: 1994    Years since quitting: 29.0   Smokeless tobacco: Never   Tobacco comments:    quit 15 years ago  (2017) never smoked alot  Vaping Use   Vaping Use: Never used  Substance and Sexual Activity   Alcohol use: No   Drug use: No   Sexual activity: Not on file  Other Topics Concern   Not on file  Social History Narrative   Not on file   Social Determinants of Health   Financial Resource Strain: Not on file  Food Insecurity: Not on file  Transportation Needs: Not on file  Physical Activity: Not on file  Stress: Not on file  Social Connections: Not on file  Intimate Partner Violence: Not on file  Family History:   The patient's family history includes Bladder Cancer in his brother; Colon cancer (age of onset: 6) in his brother; Colon polyps in his brother; Hyperlipidemia in his father; Hypertension in his father; Lung cancer in his father; Thyroid cancer in his mother. There is no history of Esophageal cancer, Stomach cancer, or Rectal cancer.    ROS:  Please see the history of present illness.  All other ROS reviewed and negative.     Physical Exam/Data:   Vitals:   06/21/21 1802 06/21/21 2024 06/21/21 2247 06/22/21 0104  BP:  136/86 (!) 140/129 (!) 161/105  Pulse:  61 (!) 54 (!) 55  Resp:  17 18 17   Temp:  98.2 F (36.8 C) 98.4 F (36.9 C)   TempSrc:      SpO2:  93% 93% 98%  Weight: 92.5 kg     Height: 6\' 2"  (1.88 m)      No intake or output data in the 24 hours ending 06/22/21 0137 Last 3 Weights 06/21/2021 09/10/2020 07/06/2019  Weight (lbs) 204 lb 216 lb 9.6 oz 194 lb 6.4 oz  Weight (kg) 92.534 kg 98.249 kg 88.179 kg     Body mass index is 26.19 kg/m.  General:  Well nourished, well developed, in no acute distress HEENT: normal Neck: no JVD Vascular: No carotid bruits; Distal pulses 2+ bilaterally   Cardiac:  normal S1, S2; RRR; no murmur  Lungs:  clear to auscultation bilaterally, no wheezing, rhonchi or rales  Abd: soft, nontender, no hepatomegaly  Ext: no edema Musculoskeletal:  No deformities, BUE and BLE strength normal and equal Skin: warm and dry   Neuro:  CNs 2-12 intact, no focal abnormalities noted Psych:  Normal affect    Laboratory Data:  High Sensitivity Troponin:   Recent Labs  Lab 06/21/21 1812 06/21/21 2020  TROPONINIHS 10 8      Chemistry Recent Labs  Lab 06/21/21 1812  NA 135  K 3.6  CL 107  CO2 21*  GLUCOSE 108*  BUN 24*  CREATININE 1.50*  CALCIUM 8.8*  GFRNONAA 49*  ANIONGAP 7    No results for input(s): PROT, ALBUMIN, AST, ALT, ALKPHOS, BILITOT in the last 168 hours. Lipids No results for input(s): CHOL, TRIG, HDL, LABVLDL, LDLCALC, CHOLHDL in the last 168 hours. Hematology Recent Labs  Lab 06/21/21 1812  WBC 9.7  RBC 4.62  HGB 15.1  HCT 46.2  MCV 100.0  MCH 32.7  MCHC 32.7  RDW 13.0  PLT 209   Thyroid No results for input(s): TSH, FREET4 in the last 168 hours. BNPNo results for input(s): BNP, PROBNP in the last 168 hours.  DDimer No results for input(s): DDIMER in the last 168 hours.   Radiology/Studies:  DG Chest 2 View  Result Date: 06/21/2021 CLINICAL DATA:  Left-sided chest pressure starting yesterday, radiating to left shoulder EXAM: CHEST - 2 VIEW COMPARISON:  06/14/2019 FINDINGS: Frontal and lateral views of the chest demonstrate an unremarkable cardiac silhouette. Lung volumes are diminished, with hypoventilatory changes at the lung bases. No airspace disease, effusion, or pneumothorax. No acute bony abnormalities. IMPRESSION: 1. Hypoventilatory changes.  No acute process. Electronically Signed   By: Randa Ngo M.D.   On: 06/21/2021 18:57    LHC: 07/2018 Conclusion    Ost Cx to Prox Cx lesion is 20% stenosed. Dist LM to Ost LAD lesion is 50% stenosed. Prox LAD lesion is 30% stenosed. Prox LAD to Mid LAD lesion is 20% stenosed. The left ventricular systolic  function is normal. LV end diastolic pressure is normal. The left ventricular ejection fraction is 45-50% by visual estimate. There is no mitral valve regurgitation.   1. Moderate (40-50%) ostial LAD stenosis.  This is a smooth lesion and does not appear to be flow limiting.  2. Mild non-obstructive disease in the proximal Circumflex artery 3. Large dominant RCA with no obvious plaque.  4. Mild segmental LV systolic dysfunction, TIRW=43-15%. Apical hypokinesis.    Recommendations: Medical management of CAD. I do not the in the smooth, moderate narrowing of the ostial/proximal LAD is causing his chest pain. Echo in am to better assess LV systolic function.   ECHO: 2020 IMPRESSIONS     1. The left ventricle has normal systolic function with an ejection  fraction of 60-65%. The cavity size was mildly dilated. Left ventricular  diastolic parameters were normal.   2. The mitral valve is normal in structure.   3. The tricuspid valve is normal in structure.   4. The aortic valve is tricuspid Mild calcification of the aortic valve.  Aortic valve regurgitation is mild by color flow Doppler.   5. The pulmonic valve was normal in structure.   6. There is mild dilatation of the aortic root measuring 40 mm.   Assessment and Plan:   Chest pain/Unstable angina Non obstructive CAD (2020 LHC: 50%ostial LAD dx, prox LCX 20%) HTN, HLD H/o PVCs  Plan: - admit to Cardiology team. Given worsening and progressive angina- this reflects unstable angina and his CAD has probably progressed since 2020 CATH  - s/p aspirin, continue aspirin, statin, metoprolol tart (listed as itching- not an allergy) - begin heparin gtt - trend trops/ekg - obtain ECHO in am - repeat lipd panel, A1c.  - LHC/CA on Monday, need hydration prior to CATH.  Full code  Risk Assessment/Risk Scores:    TIMI Risk Score for Unstable Angina or Non-ST Elevation MI:   The patient's TIMI risk score is 5, which indicates a 26% risk of all cause mortality, new or recurrent myocardial infarction or need for urgent revascularization in the next 14 days.       Severity of Illness: The appropriate patient status for this patient is INPATIENT.  Inpatient status is judged to be reasonable and necessary in order to provide the required intensity of service to ensure the patient's safety. The patient's presenting symptoms, physical exam findings, and initial radiographic and laboratory data in the context of their chronic comorbidities is felt to place them at high risk for further clinical deterioration. Furthermore, it is not anticipated that the patient will be medically stable for discharge from the hospital within 2 midnights of admission.   * I certify that at the point of admission it is my clinical judgment that the patient will require inpatient hospital care spanning beyond 2 midnights from the point of admission due to high intensity of service, high risk for further deterioration and high frequency of surveillance required.*   For questions or updates, please contact [place] Please consult [email or web address] for contact info under     Signed, Renae Fickle, MD  06/22/2021 1:37 AM

## 2021-06-22 NOTE — Progress Notes (Signed)
ANTICOAGULATION CONSULT NOTE - Initial Consult  Pharmacy Consult for Heparin Indication: chest pain/ACS  Allergies  Allergen Reactions   Other Nausea And Vomiting and Other (See Comments)    "STRONG" PAIN MEDICATIONS IN GENERAL: HEADACHE   Tamsulosin Anaphylaxis   Codeine Nausea And Vomiting    REACTION: Headache   Morphine And Related Nausea And Vomiting   Metoprolol Itching   Oxybutynin Swelling   Pentazocine-Naloxone Hcl Nausea And Vomiting   Sertraline Swelling    Swelling in knee 100 mg   Topiramate Other (See Comments)    Headache    Patient Measurements: Height: 6\' 2"  (188 cm) Weight: 92.5 kg (204 lb) IBW/kg (Calculated) : 82.2  Vital Signs: Temp: 98.4 F (36.9 C) (12/30 2247) Temp Source: Oral (12/30 1757) BP: 161/105 (12/31 0104) Pulse Rate: 55 (12/31 0104)  Labs: Recent Labs    06/21/21 1812 06/21/21 2020  HGB 15.1  --   HCT 46.2  --   PLT 209  --   CREATININE 1.50*  --   TROPONINIHS 10 8    Estimated Creatinine Clearance: 50.2 mL/min (A) (by C-G formula based on SCr of 1.5 mg/dL (H)).   Medical History: Past Medical History:  Diagnosis Date   Adenomatous colon polyp    Anxiety    Arthritis    Asthma    Coronary artery disease    Nonobstructive at cardiac catheterization 2020   Depression    DVT (deep venous thrombosis) ([place]) 2008   Essential hypertension    GERD (gastroesophageal reflux disease)    History of kidney stones    Hyperlipidemia    Internal hemorrhoids    Peripheral neuropathy    PTSD (post-traumatic stress disorder)    Sleep apnea    cpap   Vertigo     Medications:  No current facility-administered medications on file prior to encounter.   Current Outpatient Medications on File Prior to Encounter  Medication Sig Dispense Refill   acetaminophen (TYLENOL) 500 MG tablet Take 500 mg by mouth every 6 (six) hours as needed for fever.     amLODipine (NORVASC) 2.5 MG tablet Take 1 tablet (2.5 mg total) by mouth daily. 180  tablet 3   amLODipine (NORVASC) 5 MG tablet TAKE 1 TABLET BY MOUTH ONCE A DAY. 90 tablet 0   Artificial Tear Ointment (DRY EYES OP) Apply 1 application to eye as needed (dry eyes).      aspirin EC 81 MG EC tablet Take 1 tablet (81 mg total) by mouth daily.     Biotin 5 MG CAPS Take 10 mg by mouth daily.      buPROPion (WELLBUTRIN XL) 150 MG 24 hr tablet Take 150 mg by mouth daily.     busPIRone (BUSPAR) 10 MG tablet Take 10 mg by mouth 2 (two) times daily. anxiety     fluorometholone (FML) 0.1 % ophthalmic suspension Place 1 drop into both eyes 3 (three) times daily.      losartan (COZAAR) 50 MG tablet TAKE 1 TABLET BY MOUTH TWICE A DAILY. 180 tablet 2   mirtazapine (REMERON) 45 MG tablet Take 45 mg by mouth at bedtime.     nitroGLYCERIN (NITROSTAT) 0.4 MG SL tablet Place 1 tablet (0.4 mg total) under the tongue every 5 (five) minutes x 3 doses as needed for chest pain. 25 tablet 0   omeprazole (PRILOSEC) 20 MG capsule Take 20 mg by mouth every morning.      ondansetron (ZOFRAN ODT) 4 MG disintegrating tablet Take 1 tablet (  4 mg total) by mouth every 8 (eight) hours as needed for nausea or vomiting. 20 tablet 0   promethazine (PHENERGAN) 25 MG tablet Take 25 mg by mouth every 6 (six) hours as needed.     psyllium (METAMUCIL) 58.6 % powder Take 1 packet by mouth daily.      rosuvastatin (CRESTOR) 20 MG tablet TAKE 1 TABLET BY MOUTH AT 6PM. 90 tablet 3     Assessment: 75 y.o. male with chest pain for heparin  Goal of Therapy:  Heparin level 0.3-0.7 units/ml Monitor platelets by anticoagulation protocol: Yes   Plan:  Heparin 4000 units IV bolus, then start heparin 1100 unit/hr Check heparin level in 8 hours.   Shamari Trostel, Bronson Curb 06/22/2021,1:51 AM

## 2021-06-22 NOTE — Progress Notes (Signed)
ANTICOAGULATION CONSULT NOTE  Pharmacy Consult for Heparin Indication: chest pain/ACS  Allergies  Allergen Reactions   Other Nausea And Vomiting and Other (See Comments)    "STRONG" PAIN MEDICATIONS IN GENERAL: HEADACHE   Tamsulosin Anaphylaxis   Codeine Nausea And Vomiting    REACTION: Headache   Morphine And Related Nausea And Vomiting   Metoprolol Itching   Oxybutynin Swelling   Pentazocine-Naloxone Hcl Nausea And Vomiting   Sertraline Swelling    Swelling in knee 100 mg   Topiramate Other (See Comments)    Headache    Patient Measurements: Height: 6\' 2"  (188 cm) Weight: 92.5 kg (204 lb) IBW/kg (Calculated) : 82.2 Heparin dosing weight: 92.5 kg   Vital Signs: Temp: 98.1 F (36.7 C) (12/31 1628) Temp Source: Oral (12/31 1628) BP: 122/81 (12/31 1628) Pulse Rate: 56 (12/31 1628)  Labs: Recent Labs    06/21/21 1812 06/21/21 2020 06/22/21 0620 06/22/21 0833 06/22/21 0837 06/22/21 1631  HGB 15.1  --  14.2  --   --   --   HCT 46.2  --  42.8  --   --   --   PLT 209  --  204  --   --   --   HEPARINUNFRC  --   --   --  0.31  --  0.25*  CREATININE 1.50*  --  1.47*  --   --   --   TROPONINIHS 10 8 7   --  5  --      Estimated Creatinine Clearance: 51.3 mL/min (A) (by C-G formula based on SCr of 1.47 mg/dL (H)).   Medical History: Past Medical History:  Diagnosis Date   Adenomatous colon polyp    Anxiety    Arthritis    Asthma    Coronary artery disease    Nonobstructive at cardiac catheterization 2020   Depression    DVT (deep venous thrombosis) ([place]) 2008   Essential hypertension    GERD (gastroesophageal reflux disease)    History of kidney stones    Hyperlipidemia    Internal hemorrhoids    Peripheral neuropathy    PTSD (post-traumatic stress disorder)    Sleep apnea    cpap   Vertigo     Medications:   heparin 1,100 Units/hr (06/22/21 0600)    Assessment: 75 y.o. male with chest pain for heparin.  Awaiting cath this admission.  Initial  Heparin level at goal this morning.  CBC stable, no overt bleeding or complications noted.    Next heparin level down to 0.25 , below goal on same rate of heparin 1100 units/hr.  No issues/interruptions with infusion and no bleeding noted per RN's report.   Goal of Therapy:  Heparin level 0.3-0.7 units/ml Monitor platelets by anticoagulation protocol: Yes   Plan:  Bolus heparin 1000 units IV x 1 and increase IV heparin rate to 1300 units/hr. Check 6- 8 hour heparin level  Daily heparin level and CBC. F/u plans for cath.   Nicole Cella, RPh Clinical Pharmacist  06/22/2021 5:23 PM   [facility] pharmacy phone numbers are listed on [email or web address]

## 2021-06-23 DIAGNOSIS — I200 Unstable angina: Secondary | ICD-10-CM

## 2021-06-23 DIAGNOSIS — I10 Essential (primary) hypertension: Secondary | ICD-10-CM | POA: Diagnosis not present

## 2021-06-23 LAB — CBC
HCT: 43.6 % (ref 39.0–52.0)
Hemoglobin: 14.9 g/dL (ref 13.0–17.0)
MCH: 33.6 pg (ref 26.0–34.0)
MCHC: 34.2 g/dL (ref 30.0–36.0)
MCV: 98.4 fL (ref 80.0–100.0)
Platelets: 195 10*3/uL (ref 150–400)
RBC: 4.43 MIL/uL (ref 4.22–5.81)
RDW: 13.1 % (ref 11.5–15.5)
WBC: 8.3 10*3/uL (ref 4.0–10.5)
nRBC: 0 % (ref 0.0–0.2)

## 2021-06-23 LAB — HEPARIN LEVEL (UNFRACTIONATED)
Heparin Unfractionated: 0.41 IU/mL (ref 0.30–0.70)
Heparin Unfractionated: 0.49 IU/mL (ref 0.30–0.70)

## 2021-06-23 MED ORDER — SODIUM CHLORIDE 0.9% FLUSH
3.0000 mL | Freq: Two times a day (BID) | INTRAVENOUS | Status: DC
  Administered 2021-06-23 – 2021-06-25 (×4): 3 mL via INTRAVENOUS

## 2021-06-23 NOTE — Plan of Care (Signed)

## 2021-06-23 NOTE — Progress Notes (Signed)
ANTICOAGULATION CONSULT NOTE  Pharmacy Consult for Heparin Indication: chest pain/ACS  Allergies  Allergen Reactions   Other Nausea And Vomiting and Other (See Comments)    "STRONG" PAIN MEDICATIONS IN GENERAL: HEADACHE   Tamsulosin Anaphylaxis   Codeine Nausea And Vomiting    REACTION: Headache   Morphine And Related Nausea And Vomiting   Metoprolol Itching   Oxybutynin Swelling   Pentazocine-Naloxone Hcl Nausea And Vomiting   Sertraline Swelling    Swelling in knee 100 mg   Topiramate Other (See Comments)    Headache    Patient Measurements: Height: 6\' 2"  (188 cm) Weight: 92.5 kg (204 lb) IBW/kg (Calculated) : 82.2 Heparin dosing weight: 92.5 kg   Vital Signs: Temp: 98.1 F (36.7 C) (01/01 1048) Temp Source: Oral (01/01 1048) BP: 116/82 (01/01 1200) Pulse Rate: 67 (01/01 1200)  Labs: Recent Labs    06/21/21 1812 06/21/21 2020 06/22/21 0620 06/22/21 0833 06/22/21 0837 06/22/21 1631 06/23/21 0105 06/23/21 1145  HGB 15.1  --  14.2  --   --   --  14.9  --   HCT 46.2  --  42.8  --   --   --  43.6  --   PLT 209  --  204  --   --   --  195  --   HEPARINUNFRC  --   --   --    < >  --  0.25* 0.41 0.49  CREATININE 1.50*  --  1.47*  --   --   --   --   --   TROPONINIHS 10 8 7   --  5  --   --   --    < > = values in this interval not displayed.     Estimated Creatinine Clearance: 51.3 mL/min (A) (by C-G formula based on SCr of 1.47 mg/dL (H)).   Medical History: Past Medical History:  Diagnosis Date   Adenomatous colon polyp    Anxiety    Arthritis    Asthma    Coronary artery disease    Nonobstructive at cardiac catheterization 2020   Depression    DVT (deep venous thrombosis) ([place]) 2008   Essential hypertension    GERD (gastroesophageal reflux disease)    History of kidney stones    Hyperlipidemia    Internal hemorrhoids    Peripheral neuropathy    PTSD (post-traumatic stress disorder)    Sleep apnea    cpap   Vertigo     Medications:    heparin 1,300 Units/hr (06/22/21 1744)    Assessment: 76 y.o. male with chest pain for heparin.  Awaiting cath this admission.    Heparin level at goal this afternoon.  CBC stable.  No overt bleeding or complications noted.     Goal of Therapy:  Heparin level 0.3-0.7 units/ml Monitor platelets by anticoagulation protocol: Yes   Plan:  Cont heparin 1300 units/hr Daily heparin level and CBC.  [place] Crane, Roylene Reason, BCCP Clinical Pharmacist  06/23/2021 2:02 PM   [facility] pharmacy phone numbers are listed on [email or web address]

## 2021-06-23 NOTE — Progress Notes (Signed)
° °  Progress Note   Subjective   Doing well today, the patient denies CP or SOB.  No new concerns  Inpatient Medications    Scheduled Meds:  amLODipine  5 mg Oral Daily   aspirin EC  81 mg Oral Daily   buPROPion  150 mg Oral Daily   busPIRone  10 mg Oral BID   fluorometholone  1 drop Both Eyes TID   metoprolol tartrate  12.5 mg Oral BID   mirtazapine  45 mg Oral QHS   pantoprazole  40 mg Oral Daily   rosuvastatin  20 mg Oral Daily   Continuous Infusions:  heparin 1,300 Units/hr (06/22/21 1744)   PRN Meds: acetaminophen, nitroGLYCERIN, ondansetron (ZOFRAN) IV, promethazine   Vital Signs    Vitals:   06/22/21 2321 06/23/21 0432 06/23/21 0742 06/23/21 0920  BP: 107/71 115/78 (!) 131/94 (!) 116/100  Pulse: (!) 53 (!) 45 (!) 55   Resp: 16 20 18    Temp: (!) 97.2 F (36.2 C) 97.6 F (36.4 C) 97.7 F (36.5 C)   TempSrc: Oral Oral Oral   SpO2: 95% 99% 96%   Weight:      Height:        Intake/Output Summary (Last 24 hours) at 06/23/2021 1001 Last data filed at 06/23/2021 0817 Gross per 24 hour  Intake 699.02 ml  Output 1650 ml  Net -950.98 ml   Filed Weights   06/21/21 1802  Weight: 92.5 kg    Telemetry    sinus - Personally Reviewed  Physical Exam   GEN- The patient is well appearing, alert and oriented x 3 today.   Head- normocephalic, atraumatic Eyes-  Sclera clear, conjunctiva pink Ears- hearing intact Oropharynx- clear Neck- supple, Lungs-  normal work of breathing Heart- Regular rate and rhythm  GI- soft  Extremities- no clubbing, cyanosis, or edema  MS- no significant deformity or atrophy Skin- no rash or lesion Psych- euthymic mood, full affect Neuro- strength and sensation are intact   Labs    Chemistry Recent Labs  Lab 06/21/21 1812 06/22/21 0620  NA 135 139  K 3.6 3.8  CL 107 109  CO2 21* 22  GLUCOSE 108* 96  BUN 24* 21  CREATININE 1.50* 1.47*  CALCIUM 8.8* 8.6*  GFRNONAA 49* 50*  ANIONGAP 7 8     Hematology Recent Labs   Lab 06/21/21 1812 06/22/21 0620 06/23/21 0105  WBC 9.7 7.8 8.3  RBC 4.62 4.37 4.43  HGB 15.1 14.2 14.9  HCT 46.2 42.8 43.6  MCV 100.0 97.9 98.4  MCH 32.7 32.5 33.6  MCHC 32.7 33.2 34.2  RDW 13.0 13.1 13.1  PLT 209 204 195     Patient ID  Andrew Bates is a 76 y.o. male with Htn, HLD, Non obstructive CAD on 2020 LHC, h/o PVCs, GERD  who is being seen 06/22/2021 for the evaluation of chest pain.  Assessment & Plan    1.  Chest pain,  concerning for unstable angina He has known mild to moderate CAD by cath 2020 and is followed by Dr Domenic Polite Continue IV heparin Echo 06/22/21 reviewed Planned for cath on Tuesday  2. CRI, stage IIIB Will need hydration tomorrow in anticipation of cath on Tuesday.  3. HTN Stable No change required today   Thompson Grayer MD, [facility] 06/23/2021 10:01 AM

## 2021-06-23 NOTE — Progress Notes (Signed)
ANTICOAGULATION CONSULT NOTE  Pharmacy Consult for Heparin Indication: chest pain/ACS  Allergies  Allergen Reactions   Other Nausea And Vomiting and Other (See Comments)    "STRONG" PAIN MEDICATIONS IN GENERAL: HEADACHE   Tamsulosin Anaphylaxis   Codeine Nausea And Vomiting    REACTION: Headache   Morphine And Related Nausea And Vomiting   Metoprolol Itching   Oxybutynin Swelling   Pentazocine-Naloxone Hcl Nausea And Vomiting   Sertraline Swelling    Swelling in knee 100 mg   Topiramate Other (See Comments)    Headache    Patient Measurements: Height: 6\' 2"  (188 cm) Weight: 92.5 kg (204 lb) IBW/kg (Calculated) : 82.2 Heparin dosing weight: 92.5 kg   Vital Signs: Temp: 97.2 F (36.2 C) (12/31 2321) Temp Source: Oral (12/31 2321) BP: 107/71 (12/31 2321) Pulse Rate: 53 (12/31 2321)  Labs: Recent Labs    06/21/21 1812 06/21/21 2020 06/22/21 0620 06/22/21 0833 06/22/21 0837 06/22/21 1631 06/23/21 0105  HGB 15.1  --  14.2  --   --   --  14.9  HCT 46.2  --  42.8  --   --   --  43.6  PLT 209  --  204  --   --   --  195  HEPARINUNFRC  --   --   --  0.31  --  0.25* 0.41  CREATININE 1.50*  --  1.47*  --   --   --   --   TROPONINIHS 10 8 7   --  5  --   --      Estimated Creatinine Clearance: 51.3 mL/min (A) (by C-G formula based on SCr of 1.47 mg/dL (H)).   Medical History: Past Medical History:  Diagnosis Date   Adenomatous colon polyp    Anxiety    Arthritis    Asthma    Coronary artery disease    Nonobstructive at cardiac catheterization 2020   Depression    DVT (deep venous thrombosis) ([place]) 2008   Essential hypertension    GERD (gastroesophageal reflux disease)    History of kidney stones    Hyperlipidemia    Internal hemorrhoids    Peripheral neuropathy    PTSD (post-traumatic stress disorder)    Sleep apnea    cpap   Vertigo     Medications:   heparin 1,300 Units/hr (06/22/21 1744)    Assessment: 76 y.o. male with chest pain for heparin.   Awaiting cath this admission.  Initial Heparin level at goal this morning.  CBC stable, no overt bleeding or complications noted.    Next heparin level down to 0.25 , below goal on same rate of heparin 1100 units/hr.  No issues/interruptions with infusion and no bleeding noted per RN's report.   1/1 AM update:  Heparin level therapeutic   Goal of Therapy:  Heparin level 0.3-0.7 units/ml Monitor platelets by anticoagulation protocol: Yes   Plan:  Cont heparin 1300 units/hr 1200 heparin level  Narda Bonds, PharmD, BCPS Clinical Pharmacist Phone: [phone]

## 2021-06-24 DIAGNOSIS — N1832 Chronic kidney disease, stage 3b: Secondary | ICD-10-CM | POA: Diagnosis not present

## 2021-06-24 DIAGNOSIS — E785 Hyperlipidemia, unspecified: Secondary | ICD-10-CM

## 2021-06-24 DIAGNOSIS — I25110 Atherosclerotic heart disease of native coronary artery with unstable angina pectoris: Principal | ICD-10-CM

## 2021-06-24 DIAGNOSIS — I10 Essential (primary) hypertension: Secondary | ICD-10-CM | POA: Diagnosis not present

## 2021-06-24 LAB — BASIC METABOLIC PANEL
Anion gap: 7 (ref 5–15)
BUN: 21 mg/dL (ref 8–23)
CO2: 23 mmol/L (ref 22–32)
Calcium: 8.3 mg/dL — ABNORMAL LOW (ref 8.9–10.3)
Chloride: 107 mmol/L (ref 98–111)
Creatinine, Ser: 1.79 mg/dL — ABNORMAL HIGH (ref 0.61–1.24)
GFR, Estimated: 39 mL/min — ABNORMAL LOW (ref >60)
Glucose, Bld: 108 mg/dL — ABNORMAL HIGH (ref 70–99)
Potassium: 4 mmol/L (ref 3.5–5.1)
Sodium: 137 mmol/L (ref 135–145)

## 2021-06-24 LAB — CBC
HCT: 44.1 % (ref 39.0–52.0)
Hemoglobin: 14.9 g/dL (ref 13.0–17.0)
MCH: 33.2 pg (ref 26.0–34.0)
MCHC: 33.8 g/dL (ref 30.0–36.0)
MCV: 98.2 fL (ref 80.0–100.0)
Platelets: 206 10*3/uL (ref 150–400)
RBC: 4.49 MIL/uL (ref 4.22–5.81)
RDW: 13 % (ref 11.5–15.5)
WBC: 8.1 10*3/uL (ref 4.0–10.5)
nRBC: 0 % (ref 0.0–0.2)

## 2021-06-24 LAB — HEPARIN LEVEL (UNFRACTIONATED): Heparin Unfractionated: 0.71 IU/mL — ABNORMAL HIGH (ref 0.30–0.70)

## 2021-06-24 MED ORDER — NITROGLYCERIN IN D5W 200-5 MCG/ML-% IV SOLN
INTRAVENOUS | Status: AC
  Administered 2021-06-24: 5 ug/min via INTRAVENOUS
  Filled 2021-06-24: qty 250

## 2021-06-24 MED ORDER — SODIUM CHLORIDE 0.9% FLUSH: 3.0000 mL | INTRAVENOUS | Status: DC | PRN

## 2021-06-24 MED ORDER — NITROGLYCERIN IN D5W 200-5 MCG/ML-% IV SOLN: 0.0000 ug/min | INTRAVENOUS | Status: DC

## 2021-06-24 MED ORDER — SODIUM CHLORIDE 0.9 % IV SOLN: 250.0000 mL | INTRAVENOUS | Status: DC | PRN

## 2021-06-24 MED ORDER — SODIUM CHLORIDE 0.9 % WEIGHT BASED INFUSION
1.0000 mL/kg/h | INTRAVENOUS | Status: DC
  Administered 2021-06-24: 1 mL/kg/h via INTRAVENOUS

## 2021-06-24 NOTE — Progress Notes (Signed)
ANTICOAGULATION CONSULT NOTE - Follow Up Consult  Pharmacy Consult for Heparin Indication: chest pain/ACS  Allergies  Allergen Reactions   Other Nausea And Vomiting and Other (See Comments)    "STRONG" PAIN MEDICATIONS IN GENERAL: HEADACHE   Tamsulosin Anaphylaxis   Codeine Nausea And Vomiting    REACTION: Headache   Morphine And Related Nausea And Vomiting   Metoprolol Itching   Oxybutynin Swelling   Pentazocine-Naloxone Hcl Nausea And Vomiting   Sertraline Swelling    Swelling in knee 100 mg   Topiramate Other (See Comments)    Headache    Patient Measurements: Height: 6\' 2"  (188 cm) Weight: 92.5 kg (204 lb) IBW/kg (Calculated) : 82.2 Heparin Dosing Weight:  92.5 kg  Vital Signs: Temp: 97.9 F (36.6 C) (01/02 0809) Temp Source: Oral (01/02 0809) BP: 121/90 (01/02 0809) Pulse Rate: 50 (01/02 0809)  Labs: Recent Labs    06/21/21 1812 06/21/21 2020 06/22/21 0620 06/22/21 6269 06/22/21 0837 06/22/21 1631 06/23/21 0105 06/23/21 1145 06/24/21 0120 06/24/21 0711  HGB 15.1  --  14.2  --   --   --  14.9  --  14.9  --   HCT 46.2  --  42.8  --   --   --  43.6  --  44.1  --   PLT 209  --  204  --   --   --  195  --  206  --   HEPARINUNFRC  --   --   --    < >  --    < > 0.41 0.49  --  0.71*  CREATININE 1.50*  --  1.47*  --   --   --   --   --  1.79*  --   TROPONINIHS 10 8 7   --  5  --   --   --   --   --    < > = values in this interval not displayed.    Estimated Creatinine Clearance: 42.1 mL/min (A) (by C-G formula based on SCr of 1.79 mg/dL (H)).   Assessment: Anticoag: IV heparin for ACS. HL 0.71. CBC WNL  Goal of Therapy:  Heparin level 0.3-0.7 units/ml Monitor platelets by anticoagulation protocol: Yes   Plan:  Decrease IV heparin to 1250 units/hr Daily HL and CBC   Aubriana Ravelo S. [facility] [place], PharmD, BCPS Clinical Staff Pharmacist [email or web address] [facility] [place], [place] 06/24/2021,10:04 AM

## 2021-06-24 NOTE — Progress Notes (Signed)
Progress Note  Patient Name: Andrew Bates Date of Encounter: 06/24/2021  [facility] Cardiologist: Rozann Lesches, MD   Patient Profile     76 y.o. male with known nonobstructive CAD by cath in 2020 along with HTN, HLD, CKD 3B, H/O PVCs and GERD admitted for evaluation of chest pain concerning for unstable angina.  Subjective   Did have some chest pain last night, but feels well this morning.  No further symptoms.  Inpatient Medications    Scheduled Meds:  amLODipine  5 mg Oral Daily   aspirin EC  81 mg Oral Daily   buPROPion  150 mg Oral Daily   busPIRone  10 mg Oral BID   fluorometholone  1 drop Both Eyes TID   metoprolol tartrate  12.5 mg Oral BID   mirtazapine  45 mg Oral QHS   pantoprazole  40 mg Oral Daily   rosuvastatin  20 mg Oral Daily   sodium chloride flush  3 mL Intravenous Q12H   Continuous Infusions:  heparin 1,300 Units/hr (06/22/21 1744)   PRN Meds: acetaminophen, nitroGLYCERIN, ondansetron (ZOFRAN) IV, promethazine   Vital Signs    Vitals:   06/23/21 1600 06/23/21 2000 06/23/21 2352 06/24/21 0400  BP: 115/83 127/75 111/73 105/66  Pulse: 60 (!) 58 (!) 52 (!) 45  Resp: 17 17 17 16   Temp:  97.8 F (36.6 C) 97.9 F (36.6 C) 97.8 F (36.6 C)  TempSrc:  Oral Oral Oral  SpO2: (!) 87% 91% 90% 94%  Weight:      Height:        Intake/Output Summary (Last 24 hours) at 06/24/2021 0737 Last data filed at 06/24/2021 0432 Gross per 24 hour  Intake 480 ml  Output 1525 ml  Net -1045 ml   Last 3 Weights 06/21/2021 09/10/2020 07/06/2019  Weight (lbs) 204 lb 216 lb 9.6 oz 194 lb 6.4 oz  Weight (kg) 92.534 kg 98.249 kg 88.179 kg      Telemetry    Sinus bradycardia, rates in the 50s- Personally Reviewed  ECG    Sinus bradycardia with PVCs, heart rate 54- Personally Reviewed  Physical Exam   GEN: No acute distress.  Resting comfortably. Neck: No JVD or bruit Cardiac: RRR, no murmurs, rubs, or gallops.  Respiratory: Clear to auscultation  bilaterally.  Nonlabored, good air movement. GI: Soft, nontender, non-distended  MS: No edema; No deformity. Neuro:  Nonfocal  Psych: Normal affect   Labs    High Sensitivity Troponin:   Recent Labs  Lab 06/21/21 1812 06/21/21 2020 06/22/21 0620 06/22/21 0837  TROPONINIHS 10 8 7 5      Chemistry Recent Labs  Lab 06/21/21 1812 06/22/21 0620 06/24/21 0120  NA 135 139 137  K 3.6 3.8 4.0  CL 107 109 107  CO2 21* 22 23  GLUCOSE 108* 96 108*  BUN 24* 21 21  CREATININE 1.50* 1.47* 1.79*  CALCIUM 8.8* 8.6* 8.3*  GFRNONAA 49* 50* 39*  ANIONGAP 7 8 7     Lipids  Recent Labs  Lab 06/22/21 0620  CHOL 112  TRIG 72  HDL 41  LDLCALC 57  CHOLHDL 2.7    Hematology Recent Labs  Lab 06/22/21 0620 06/23/21 0105 06/24/21 0120  WBC 7.8 8.3 8.1  RBC 4.37 4.43 4.49  HGB 14.2 14.9 14.9  HCT 42.8 43.6 44.1  MCV 97.9 98.4 98.2  MCH 32.5 33.6 33.2  MCHC 33.2 34.2 33.8  RDW 13.1 13.1 13.0  PLT 204 195 206   Thyroid No  results for input(s): TSH, FREET4 in the last 168 hours.  BNPNo results for input(s): BNP, PROBNP in the last 168 hours.  DDimer No results for input(s): DDIMER in the last 168 hours.   Radiology    ECHOCARDIOGRAM COMPLETE  Result Date: 06/22/2021    ECHOCARDIOGRAM REPORT   Patient Name:   Andrew Bates Date of Exam: 06/22/2021 Medical Rec #:  [id number]      Height:       74.0 in Accession #:    [id number]     Weight:       204.0 lb Date of Birth:  September 24, 1945      BSA:          2.192 m Patient Age:    63 years       BP:           131/84 mmHg Patient Gender: M              HR:           58 bpm. Exam Location:  Inpatient Procedure: 2D Echo, Color Doppler and Cardiac Doppler Indications:    Chest pain  History:        Patient has prior history of Echocardiogram examinations, most                 recent 08/18/2018. CAD; Risk Factors:Hypertension.  Sonographer:    Jyl Heinz Referring Phys: [id number] ROBIN [place]  1. Left ventricular ejection fraction, by  estimation, is 60 to 65%. The left ventricle has normal function. The left ventricle has no regional wall motion abnormalities. Left ventricular diastolic parameters are consistent with Grade I diastolic dysfunction (impaired relaxation).  2. Right ventricular systolic function is normal. The right ventricular size is normal. There is normal pulmonary artery systolic pressure. The estimated right ventricular systolic pressure is 30.8 mmHg.  3. The mitral valve is grossly normal. Trivial mitral valve regurgitation.  4. The aortic valve is tricuspid. Aortic valve regurgitation is trivial.  5. The inferior vena cava is normal in size with greater than 50% respiratory variability, suggesting right atrial pressure of 3 mmHg. Comparison(s): Changes from prior study are noted. 08/18/2018: LVEF 60-65%, mild AI, dilated aortic root to 40 mm. FINDINGS  Left Ventricle: Left ventricular ejection fraction, by estimation, is 60 to 65%. The left ventricle has normal function. The left ventricle has no regional wall motion abnormalities. The left ventricular internal cavity size was normal in size. There is  no left ventricular hypertrophy. Left ventricular diastolic parameters are consistent with Grade I diastolic dysfunction (impaired relaxation). Indeterminate filling pressures. Right Ventricle: The right ventricular size is normal. No increase in right ventricular wall thickness. Right ventricular systolic function is normal. There is normal pulmonary artery systolic pressure. The tricuspid regurgitant velocity is 2.60 m/s, and  with an assumed right atrial pressure of 3 mmHg, the estimated right ventricular systolic pressure is 65.7 mmHg. Left Atrium: Left atrial size was normal in size. Right Atrium: Right atrial size was normal in size. Pericardium: There is no evidence of pericardial effusion. Mitral Valve: The mitral valve is grossly normal. Trivial mitral valve regurgitation. Tricuspid Valve: The tricuspid valve is grossly  normal. Tricuspid valve regurgitation is mild. Aortic Valve: The aortic valve is tricuspid. Aortic valve regurgitation is trivial. Aortic valve peak gradient measures 6.9 mmHg. Pulmonic Valve: The pulmonic valve was normal in structure. Pulmonic valve regurgitation is not visualized. Aorta: The aortic root and ascending aorta are structurally normal, with no evidence  of dilitation. Venous: The inferior vena cava is normal in size with greater than 50% respiratory variability, suggesting right atrial pressure of 3 mmHg. IAS/Shunts: No atrial level shunt detected by color flow Doppler.  LEFT VENTRICLE PLAX 2D LVIDd:         5.70 cm      Diastology LVIDs:         3.70 cm      LV e' medial:    7.62 cm/s LV PW:         0.90 cm      LV E/e' medial:  8.3 LV IVS:        1.00 cm      LV e' lateral:   8.16 cm/s LVOT diam:     2.00 cm      LV E/e' lateral: 7.8 LV SV:         68 LV SV Index:   31 LVOT Area:     3.14 cm  LV Volumes (MOD) LV vol d, MOD A2C: 126.0 ml LV vol d, MOD A4C: 159.0 ml LV vol s, MOD A2C: 51.4 ml LV vol s, MOD A4C: 66.1 ml LV SV MOD A2C:     74.6 ml LV SV MOD A4C:     159.0 ml LV SV MOD BP:      83.9 ml RIGHT VENTRICLE             IVC RV Basal diam:  3.40 cm     IVC diam: 1.60 cm RV Mid diam:    3.00 cm RV S prime:     16.20 cm/s TAPSE (M-mode): 2.3 cm LEFT ATRIUM             Index        RIGHT ATRIUM           Index LA diam:        3.80 cm 1.73 cm/m   RA Area:     18.50 cm LA Vol (A2C):   53.8 ml 24.55 ml/m  RA Volume:   51.50 ml  23.50 ml/m LA Vol (A4C):   43.2 ml 19.71 ml/m LA Biplane Vol: 48.2 ml 21.99 ml/m  AORTIC VALVE AV Area (Vmax): 2.16 cm AV Vmax:        131.00 cm/s AV Peak Grad:   6.9 mmHg LVOT Vmax:      90.10 cm/s LVOT Vmean:     69.200 cm/s LVOT VTI:       0.215 m  AORTA Ao Root diam: 3.50 cm Ao Asc diam:  3.70 cm MITRAL VALVE               TRICUSPID VALVE MV Area (PHT): 4.12 cm    TR Peak grad:   27.0 mmHg MV Decel Time: 184 msec    TR Vmax:        260.00 cm/s MV E velocity: 63.40  cm/s MV A velocity: 69.80 cm/s  SHUNTS MV E/A ratio:  0.91        Systemic VTI:  0.22 m                            Systemic Diam: 2.00 cm Lyman Bishop MD Electronically signed by Lyman Bishop MD Signature Date/Time: 06/22/2021/2:43:34 PM    Final     Cardiac Studies Summarized   TTE 06/22/2021: EF 60 to 65%.  No R WMA.  GR 1 DD.  Normal RV size and function.  Normal  valves.  Trivial/mild AI.   Myoview 08/11/2018: Large prior inferior and inferoseptal myocardial infarction with moderate peri-infarct ischemia.  Intermediate risk.  (Liver/Gut Attenuation) Cardiac Cath 08/17/2018: Ost-prox LCx 20%.  Distal LM-ostial LAD 50% (smooth, nonflow limiting..  Proximal LAD 30%.  Proximal-mid LAD 20%.  EF 45 to 50%. => Medical management  Assessment & Plan    Principal Problem:   Unstable angina [facility]) Active Problems:   Coronary artery disease, non-occlusive-by cardiac catheterization in 2020   Essential hypertension   Hyperlipidemia with target LDL less than 70   Stage 3b chronic kidney disease (CKD) (HCC)   Principal Problem:   Unstable angina ([place]) /Coronary artery disease, non-occlusive-by cardiac catheterization in 2020 With known moderate CAD, concern for progressive disease-plan cardiac catheterization with possible PCI on 06/25/2021  -> History of radial loop, last catheterization converted from radial to femoral.  Would consider either ulnar access or femoral. Echo normal.  No wall motion abnormality. Continue IV heparin On aspirin beta-blocker and statin along with calcium channel blocker for antianginal benefit.  Active Problems:      Essential hypertension -pressure stable on current dose of metoprolol and amlodipine.    Hyperlipidemia with target LDL less than 70 -> on rosuvastatin 20 mg daily.  Adjust based on findings in Cath Lab.  Current lipid panel is pretty well controlled.  LDL 57.    Stage 3b chronic kidney disease (CKD) (HCC) -> Baseline creatinine roughly 1.5.  Was 1.47 on  admission and 1.79 today. ->  Will need to hydrate prior to catheterization and reassess in the morning.    For questions or updates, please contact [place] Please consult [email or web address] for contact info under        Signed, Glenetta Hew, MD  06/24/2021, 7:37 AM

## 2021-06-24 NOTE — Plan of Care (Signed)

## 2021-06-24 NOTE — Progress Notes (Signed)
PT c/o increased chest pain, sublingual nitro x2 given and pain is decreased.  Pt c/o nausea, prn phenergan given with relief.  Pt c/o HA subsequent to sublingual nitro, tylenol given with relief.  Dr Ellyn Hack notified, IV Nitro ordered and started at 71.  Pt claims to feel much improvement.  Continuing to assess and monitor

## 2021-06-25 ENCOUNTER — Encounter (HOSPITAL_COMMUNITY)
Admission: EM | Disposition: A | Discharge status: Home / Self Care | Payer: Self-pay | Source: Home / Self Care | Attending: Internal Medicine

## 2021-06-25 DIAGNOSIS — I200 Unstable angina: Secondary | ICD-10-CM | POA: Diagnosis not present

## 2021-06-25 DIAGNOSIS — E782 Mixed hyperlipidemia: Secondary | ICD-10-CM

## 2021-06-25 DIAGNOSIS — N1831 Chronic kidney disease, stage 3a: Secondary | ICD-10-CM | POA: Diagnosis not present

## 2021-06-25 DIAGNOSIS — I25110 Atherosclerotic heart disease of native coronary artery with unstable angina pectoris: Secondary | ICD-10-CM | POA: Diagnosis not present

## 2021-06-25 DIAGNOSIS — I10 Essential (primary) hypertension: Secondary | ICD-10-CM | POA: Diagnosis not present

## 2021-06-25 HISTORY — PX: LEFT HEART CATH AND CORONARY ANGIOGRAPHY: CATH118249

## 2021-06-25 HISTORY — PX: INTRAVASCULAR PRESSURE WIRE/FFR STUDY: CATH118243

## 2021-06-25 LAB — GLUCOSE, CAPILLARY
Glucose-Capillary: 101 mg/dL — ABNORMAL HIGH (ref 70–99)
Glucose-Capillary: 130 mg/dL — ABNORMAL HIGH (ref 70–99)

## 2021-06-25 LAB — CBC
HCT: 46.3 % (ref 39.0–52.0)
Hemoglobin: 15.5 g/dL (ref 13.0–17.0)
MCH: 32.8 pg (ref 26.0–34.0)
MCHC: 33.5 g/dL (ref 30.0–36.0)
MCV: 98.1 fL (ref 80.0–100.0)
Platelets: 210 10*3/uL (ref 150–400)
RBC: 4.72 MIL/uL (ref 4.22–5.81)
RDW: 12.9 % (ref 11.5–15.5)
WBC: 8.9 10*3/uL (ref 4.0–10.5)
nRBC: 0 % (ref 0.0–0.2)

## 2021-06-25 LAB — BASIC METABOLIC PANEL
Anion gap: 6 (ref 5–15)
BUN: 16 mg/dL (ref 8–23)
CO2: 25 mmol/L (ref 22–32)
Calcium: 8.4 mg/dL — ABNORMAL LOW (ref 8.9–10.3)
Chloride: 109 mmol/L (ref 98–111)
Creatinine, Ser: 1.4 mg/dL — ABNORMAL HIGH (ref 0.61–1.24)
GFR, Estimated: 53 mL/min — ABNORMAL LOW (ref >60)
Glucose, Bld: 100 mg/dL — ABNORMAL HIGH (ref 70–99)
Potassium: 4.3 mmol/L (ref 3.5–5.1)
Sodium: 140 mmol/L (ref 135–145)

## 2021-06-25 LAB — POCT ACTIVATED CLOTTING TIME: Activated Clotting Time: 275 seconds

## 2021-06-25 LAB — HEPARIN LEVEL (UNFRACTIONATED): Heparin Unfractionated: 0.48 IU/mL (ref 0.30–0.70)

## 2021-06-25 SURGERY — LEFT HEART CATH AND CORONARY ANGIOGRAPHY
Anesthesia: LOCAL

## 2021-06-25 MED ORDER — SODIUM CHLORIDE 0.9 % IV SOLN: 250.0000 mL | INTRAVENOUS | Status: DC | PRN

## 2021-06-25 MED ORDER — MIDAZOLAM HCL 2 MG/2ML IJ SOLN
INTRAMUSCULAR | Status: DC | PRN
  Administered 2021-06-25: 1 mg via INTRAVENOUS

## 2021-06-25 MED ORDER — LIDOCAINE HCL (PF) 1 % IJ SOLN
INTRAMUSCULAR | Status: AC
  Filled 2021-06-25: qty 30

## 2021-06-25 MED ORDER — HYDRALAZINE HCL 20 MG/ML IJ SOLN: 10.0000 mg | INTRAMUSCULAR | Status: AC | PRN

## 2021-06-25 MED ORDER — SODIUM CHLORIDE 0.9 % IV SOLN: INTRAVENOUS | Status: AC

## 2021-06-25 MED ORDER — IOHEXOL 350 MG/ML SOLN
INTRAVENOUS | Status: DC | PRN
  Administered 2021-06-25: 110 mL via INTRACARDIAC

## 2021-06-25 MED ORDER — HEPARIN SODIUM (PORCINE) 1000 UNIT/ML IJ SOLN
INTRAMUSCULAR | Status: DC | PRN
  Administered 2021-06-25: 4000 [IU] via INTRAVENOUS
  Administered 2021-06-25: 4500 [IU] via INTRAVENOUS

## 2021-06-25 MED ORDER — FENTANYL CITRATE (PF) 100 MCG/2ML IJ SOLN
INTRAMUSCULAR | Status: AC
  Filled 2021-06-25: qty 2

## 2021-06-25 MED ORDER — SODIUM CHLORIDE 0.9% FLUSH: 3.0000 mL | INTRAVENOUS | Status: DC | PRN

## 2021-06-25 MED ORDER — HEPARIN SODIUM (PORCINE) 1000 UNIT/ML IJ SOLN
INTRAMUSCULAR | Status: AC
  Filled 2021-06-25: qty 10

## 2021-06-25 MED ORDER — VERAPAMIL HCL 2.5 MG/ML IV SOLN
INTRAVENOUS | Status: DC | PRN
  Administered 2021-06-25: 5 mL via INTRA_ARTERIAL

## 2021-06-25 MED ORDER — HEPARIN (PORCINE) IN NACL 1000-0.9 UT/500ML-% IV SOLN
INTRAVENOUS | Status: DC | PRN
  Administered 2021-06-25 (×2): 500 mL

## 2021-06-25 MED ORDER — AMLODIPINE BESYLATE 10 MG PO TABS
10.0000 mg | ORAL_TABLET | Freq: Every day | ORAL | Status: DC
  Administered 2021-06-26: 10 mg via ORAL
  Filled 2021-06-25: qty 1

## 2021-06-25 MED ORDER — FENTANYL CITRATE (PF) 100 MCG/2ML IJ SOLN
INTRAMUSCULAR | Status: DC | PRN
  Administered 2021-06-25: 25 ug via INTRAVENOUS

## 2021-06-25 MED ORDER — SODIUM CHLORIDE 0.9% FLUSH: 3.0000 mL | Freq: Two times a day (BID) | INTRAVENOUS | Status: DC

## 2021-06-25 MED ORDER — VERAPAMIL HCL 2.5 MG/ML IV SOLN
INTRAVENOUS | Status: AC
  Filled 2021-06-25: qty 2

## 2021-06-25 MED ORDER — HEPARIN (PORCINE) IN NACL 1000-0.9 UT/500ML-% IV SOLN
INTRAVENOUS | Status: AC
  Filled 2021-06-25: qty 1000

## 2021-06-25 MED ORDER — MIDAZOLAM HCL 2 MG/2ML IJ SOLN
INTRAMUSCULAR | Status: AC
  Filled 2021-06-25: qty 2

## 2021-06-25 MED ORDER — ISOSORBIDE MONONITRATE ER 30 MG PO TB24
30.0000 mg | ORAL_TABLET | Freq: Every day | ORAL | Status: DC
  Administered 2021-06-25 – 2021-06-26 (×2): 30 mg via ORAL
  Filled 2021-06-25 (×2): qty 1

## 2021-06-25 MED ORDER — LIDOCAINE HCL (PF) 1 % IJ SOLN
INTRAMUSCULAR | Status: DC | PRN
  Administered 2021-06-25: 5 mL

## 2021-06-25 SURGICAL SUPPLY — 12 items
CATH INFINITI JR4 5F (CATHETERS) ×1 IMPLANT
CATH OPTITORQUE TIG 4.0 5F (CATHETERS) ×1 IMPLANT
CATH VISTA GUIDE 6FR XB3 (CATHETERS) ×1 IMPLANT
DEVICE RAD COMP TR BAND LRG (VASCULAR PRODUCTS) ×1 IMPLANT
GLIDESHEATH SLEND SS 6F .021 (SHEATH) ×1 IMPLANT
GUIDEWIRE INQWIRE 1.5J.035X260 (WIRE) IMPLANT
GUIDEWIRE PRESSURE X 175 (WIRE) ×1 IMPLANT
INQWIRE 1.5J .035X260CM (WIRE) ×2
KIT HEART LEFT (KITS) ×3 IMPLANT
PACK CARDIAC CATHETERIZATION (CUSTOM PROCEDURE TRAY) ×3 IMPLANT
TRANSDUCER W/STOPCOCK (MISCELLANEOUS) ×3 IMPLANT
TUBING CIL FLEX 10 FLL-RA (TUBING) ×3 IMPLANT

## 2021-06-25 NOTE — Interval H&P Note (Signed)
History and Physical Interval Note:  06/25/2021 3:28 PM  Andrew Bates  has presented today for surgery, with the diagnosis of unstable angina.  The various methods of treatment have been discussed with the patient and family. After consideration of risks, benefits and other options for treatment, the patient has consented to  Procedure(s): LEFT HEART CATH AND CORONARY ANGIOGRAPHY (N/A) . PERCUTANEOUS CORONARY INTERVENTION  as a surgical intervention.  The patient's history has been reviewed, patient examined, no change in status, stable for surgery.  I have reviewed the patient's chart and labs.  Questions were answered to the patient's satisfaction.   . Cath Lab Visit (complete for each Cath Lab visit)  Clinical Evaluation Leading to the Procedure:   ACS: Yes.    Non-ACS:    Anginal Classification: CCS IV  Anti-ischemic medical therapy: Maximal Therapy (2 or more classes of medications)  Non-Invasive Test Results: No non-invasive testing performed  Prior CABG: No previous CABG    Glenetta Hew

## 2021-06-25 NOTE — Plan of Care (Signed)

## 2021-06-25 NOTE — H&P (View-Only) (Signed)
Progress Note  Patient Name: Andrew Bates Date of Encounter: 06/25/2021  [facility] Cardiologist: Rozann Lesches, MD   Subjective   Family at bedside (son and wife).  Recurrent chest discomfort this morning felt like a pressure in the upper chest.  Nitroglycerin was increased and he is chest pain-free now.  No shortness of breath or other complaints.  Inpatient Medications    Scheduled Meds:  amLODipine  5 mg Oral Daily   aspirin EC  81 mg Oral Daily   buPROPion  150 mg Oral Daily   busPIRone  10 mg Oral BID   fluorometholone  1 drop Both Eyes TID   metoprolol tartrate  12.5 mg Oral BID   mirtazapine  45 mg Oral QHS   pantoprazole  40 mg Oral Daily   rosuvastatin  20 mg Oral Daily   sodium chloride flush  3 mL Intravenous Q12H   Continuous Infusions:  sodium chloride     sodium chloride 1 mL/kg/hr (06/24/21 2228)   heparin 1,250 Units/hr (06/25/21 0647)   nitroGLYCERIN 15 mcg/min (06/25/21 0746)   PRN Meds: sodium chloride, acetaminophen, nitroGLYCERIN, ondansetron (ZOFRAN) IV, promethazine, sodium chloride flush   Vital Signs    Vitals:   06/24/21 2309 06/25/21 0409 06/25/21 0709 06/25/21 0756  BP: 134/82 113/83  132/79  Pulse: (!) 46 (!) 49  (!) 49  Resp: 18 19  18   Temp: 97.7 F (36.5 C) 98.1 F (36.7 C)  97.7 F (36.5 C)  TempSrc: Oral Oral  Oral  SpO2: 91% 94%  92%  Weight:   94.7 kg   Height:   6\' 2"  (1.88 m)     Intake/Output Summary (Last 24 hours) at 06/25/2021 0908 Last data filed at 06/25/2021 0500 Gross per 24 hour  Intake 2123.3 ml  Output 2575 ml  Net -451.7 ml   Last 3 Weights 06/25/2021 06/21/2021 09/10/2020  Weight (lbs) 208 lb 11.2 oz 204 lb 216 lb 9.6 oz  Weight (kg) 94.666 kg 92.534 kg 98.249 kg      Telemetry    Normal sinus rhythm no significant arrhythmia - Personally Reviewed  ECG    Sinus bradycardia, PVC, no significant ST/T wave changes- Personally Reviewed  Physical Exam  Pleasant male in no distress GEN: No acute  distress.   Neck: No JVD Cardiac: RRR, no murmurs, rubs, or gallops.  Respiratory: Clear to auscultation bilaterally. GI: Soft, nontender, non-distended  MS: No edema; No deformity. Neuro:  Nonfocal  Psych: Normal affect   Labs    High Sensitivity Troponin:   Recent Labs  Lab 06/21/21 1812 06/21/21 2020 06/22/21 0620 06/22/21 0837  TROPONINIHS 10 8 7 5      Chemistry Recent Labs  Lab 06/22/21 0620 06/24/21 0120 06/25/21 0807  NA 139 137 140  K 3.8 4.0 4.3  CL 109 107 109  CO2 22 23 25   GLUCOSE 96 108* 100*  BUN 21 21 16   CREATININE 1.47* 1.79* 1.40*  CALCIUM 8.6* 8.3* 8.4*  GFRNONAA 50* 39* 53*  ANIONGAP 8 7 6     Lipids  Recent Labs  Lab 06/22/21 0620  CHOL 112  TRIG 72  HDL 41  LDLCALC 57  CHOLHDL 2.7    Hematology Recent Labs  Lab 06/23/21 0105 06/24/21 0120 06/25/21 0144  WBC 8.3 8.1 8.9  RBC 4.43 4.49 4.72  HGB 14.9 14.9 15.5  HCT 43.6 44.1 46.3  MCV 98.4 98.2 98.1  MCH 33.6 33.2 32.8  MCHC 34.2 33.8 33.5  RDW 13.1  13.0 12.9  PLT 195 206 210   Thyroid No results for input(s): TSH, FREET4 in the last 168 hours.  BNPNo results for input(s): BNP, PROBNP in the last 168 hours.  DDimer No results for input(s): DDIMER in the last 168 hours.   Radiology    No results found.  Cardiac Studies   2D echocardiogram 06/22/2021  1. Left ventricular ejection fraction, by estimation, is 60 to 65%. The  left ventricle has normal function. The left ventricle has no regional  wall motion abnormalities. Left ventricular diastolic parameters are  consistent with Grade I diastolic  dysfunction (impaired relaxation).   2. Right ventricular systolic function is normal. The right ventricular  size is normal. There is normal pulmonary artery systolic pressure. The  estimated right ventricular systolic pressure is 83.4 mmHg.   3. The mitral valve is grossly normal. Trivial mitral valve  regurgitation.   4. The aortic valve is tricuspid. Aortic valve  regurgitation is trivial.   5. The inferior vena cava is normal in size with greater than 50%  respiratory variability, suggesting right atrial pressure of 3 mmHg.   Cardiac catheterization 08/17/2018: Ost Cx to Prox Cx lesion is 20% stenosed. Dist LM to Ost LAD lesion is 50% stenosed. Prox LAD lesion is 30% stenosed. Prox LAD to Mid LAD lesion is 20% stenosed. The left ventricular systolic function is normal. LV end diastolic pressure is normal. The left ventricular ejection fraction is 45-50% by visual estimate. There is no mitral valve regurgitation.   1. Moderate (40-50%) ostial LAD stenosis. This is a smooth lesion and does not appear to be flow limiting.  2. Mild non-obstructive disease in the proximal Circumflex artery 3. Large dominant RCA with no obvious plaque.  4. Mild segmental LV systolic dysfunction, HDQQ=22-97%. Apical hypokinesis.    Recommendations: Medical management of CAD. I do not the in the smooth, moderate narrowing of the ostial/proximal LAD is causing his chest pain. Echo in am to better assess LV systolic function.  Patient Profile     76 y.o. male with known mild to moderate ostial LAD stenosis identified at cardiac catheterization approximately 3 years ago, now presenting with symptoms concerning for unstable angina  Assessment & Plan    1.  Unstable angina: Patient on IV nitroglycerin and heparin.  Plans for cardiac catheterization and possible PCI today.  Currently chest pain-free after up titration of nitroglycerin. I have reviewed the risks, indications, and alternatives to cardiac catheterization, possible angioplasty, and stenting with the patient. Risks include but are not limited to bleeding, infection, vascular injury, stroke, myocardial infection, arrhythmia, kidney injury, radiation-related injury in the case of prolonged fluoroscopy use, emergency cardiac surgery, and death. The patient understands the risks of serious complication is 1-2 in 9892 with  diagnostic cardiac cath and 1-2% or less with angioplasty/stenting.   2. Chronic kidney disease stage IIIa: Creatinine improved compared to yesterday, currently at 1.4.  Attempts will be made to limit use of contrast at cardiac catheterization. 3.  Hypertension: Blood pressure controlled on metoprolol, amlodipine, and IV nitroglycerin at present 4.  Mixed hyperlipidemia: Treated with rosuvastatin.  LDL cholesterol 57.  Disposition: Pending cardiac catheterization result today.  Note patient has a right radial loop consider femoral or left radial access.   For questions or updates, please contact [place] Please consult [email or web address] for contact info under        Signed, Sherren Mocha, MD  06/25/2021, 9:08 AM

## 2021-06-25 NOTE — Progress Notes (Signed)
Progress Note  Patient Name: Andrew Bates Date of Encounter: 06/25/2021  [facility] Cardiologist: Rozann Lesches, MD   Subjective   Family at bedside (son and wife).  Recurrent chest discomfort this morning felt like a pressure in the upper chest.  Nitroglycerin was increased and he is chest pain-free now.  No shortness of breath or other complaints.  Inpatient Medications    Scheduled Meds:  amLODipine  5 mg Oral Daily   aspirin EC  81 mg Oral Daily   buPROPion  150 mg Oral Daily   busPIRone  10 mg Oral BID   fluorometholone  1 drop Both Eyes TID   metoprolol tartrate  12.5 mg Oral BID   mirtazapine  45 mg Oral QHS   pantoprazole  40 mg Oral Daily   rosuvastatin  20 mg Oral Daily   sodium chloride flush  3 mL Intravenous Q12H   Continuous Infusions:  sodium chloride     sodium chloride 1 mL/kg/hr (06/24/21 2228)   heparin 1,250 Units/hr (06/25/21 0647)   nitroGLYCERIN 15 mcg/min (06/25/21 0746)   PRN Meds: sodium chloride, acetaminophen, nitroGLYCERIN, ondansetron (ZOFRAN) IV, promethazine, sodium chloride flush   Vital Signs    Vitals:   06/24/21 2309 06/25/21 0409 06/25/21 0709 06/25/21 0756  BP: 134/82 113/83  132/79  Pulse: (!) 46 (!) 49  (!) 49  Resp: 18 19  18   Temp: 97.7 F (36.5 C) 98.1 F (36.7 C)  97.7 F (36.5 C)  TempSrc: Oral Oral  Oral  SpO2: 91% 94%  92%  Weight:   94.7 kg   Height:   6\' 2"  (1.88 m)     Intake/Output Summary (Last 24 hours) at 06/25/2021 0908 Last data filed at 06/25/2021 0500 Gross per 24 hour  Intake 2123.3 ml  Output 2575 ml  Net -451.7 ml   Last 3 Weights 06/25/2021 06/21/2021 09/10/2020  Weight (lbs) 208 lb 11.2 oz 204 lb 216 lb 9.6 oz  Weight (kg) 94.666 kg 92.534 kg 98.249 kg      Telemetry    Normal sinus rhythm no significant arrhythmia - Personally Reviewed  ECG    Sinus bradycardia, PVC, no significant ST/T wave changes- Personally Reviewed  Physical Exam  Pleasant male in no distress GEN: No acute  distress.   Neck: No JVD Cardiac: RRR, no murmurs, rubs, or gallops.  Respiratory: Clear to auscultation bilaterally. GI: Soft, nontender, non-distended  MS: No edema; No deformity. Neuro:  Nonfocal  Psych: Normal affect   Labs    High Sensitivity Troponin:   Recent Labs  Lab 06/21/21 1812 06/21/21 2020 06/22/21 0620 06/22/21 0837  TROPONINIHS 10 8 7 5      Chemistry Recent Labs  Lab 06/22/21 0620 06/24/21 0120 06/25/21 0807  NA 139 137 140  K 3.8 4.0 4.3  CL 109 107 109  CO2 22 23 25   GLUCOSE 96 108* 100*  BUN 21 21 16   CREATININE 1.47* 1.79* 1.40*  CALCIUM 8.6* 8.3* 8.4*  GFRNONAA 50* 39* 53*  ANIONGAP 8 7 6     Lipids  Recent Labs  Lab 06/22/21 0620  CHOL 112  TRIG 72  HDL 41  LDLCALC 57  CHOLHDL 2.7    Hematology Recent Labs  Lab 06/23/21 0105 06/24/21 0120 06/25/21 0144  WBC 8.3 8.1 8.9  RBC 4.43 4.49 4.72  HGB 14.9 14.9 15.5  HCT 43.6 44.1 46.3  MCV 98.4 98.2 98.1  MCH 33.6 33.2 32.8  MCHC 34.2 33.8 33.5  RDW 13.1  13.0 12.9  PLT 195 206 210   Thyroid No results for input(s): TSH, FREET4 in the last 168 hours.  BNPNo results for input(s): BNP, PROBNP in the last 168 hours.  DDimer No results for input(s): DDIMER in the last 168 hours.   Radiology    No results found.  Cardiac Studies   2D echocardiogram 06/22/2021  1. Left ventricular ejection fraction, by estimation, is 60 to 65%. The  left ventricle has normal function. The left ventricle has no regional  wall motion abnormalities. Left ventricular diastolic parameters are  consistent with Grade I diastolic  dysfunction (impaired relaxation).   2. Right ventricular systolic function is normal. The right ventricular  size is normal. There is normal pulmonary artery systolic pressure. The  estimated right ventricular systolic pressure is 89.2 mmHg.   3. The mitral valve is grossly normal. Trivial mitral valve  regurgitation.   4. The aortic valve is tricuspid. Aortic valve  regurgitation is trivial.   5. The inferior vena cava is normal in size with greater than 50%  respiratory variability, suggesting right atrial pressure of 3 mmHg.   Cardiac catheterization 08/17/2018: Ost Cx to Prox Cx lesion is 20% stenosed. Dist LM to Ost LAD lesion is 50% stenosed. Prox LAD lesion is 30% stenosed. Prox LAD to Mid LAD lesion is 20% stenosed. The left ventricular systolic function is normal. LV end diastolic pressure is normal. The left ventricular ejection fraction is 45-50% by visual estimate. There is no mitral valve regurgitation.   1. Moderate (40-50%) ostial LAD stenosis. This is a smooth lesion and does not appear to be flow limiting.  2. Mild non-obstructive disease in the proximal Circumflex artery 3. Large dominant RCA with no obvious plaque.  4. Mild segmental LV systolic dysfunction, JJHE=17-40%. Apical hypokinesis.    Recommendations: Medical management of CAD. I do not the in the smooth, moderate narrowing of the ostial/proximal LAD is causing his chest pain. Echo in am to better assess LV systolic function.  Patient Profile     76 y.o. male with known mild to moderate ostial LAD stenosis identified at cardiac catheterization approximately 3 years ago, now presenting with symptoms concerning for unstable angina  Assessment & Plan    1.  Unstable angina: Patient on IV nitroglycerin and heparin.  Plans for cardiac catheterization and possible PCI today.  Currently chest pain-free after up titration of nitroglycerin. I have reviewed the risks, indications, and alternatives to cardiac catheterization, possible angioplasty, and stenting with the patient. Risks include but are not limited to bleeding, infection, vascular injury, stroke, myocardial infection, arrhythmia, kidney injury, radiation-related injury in the case of prolonged fluoroscopy use, emergency cardiac surgery, and death. The patient understands the risks of serious complication is 1-2 in 8144 with  diagnostic cardiac cath and 1-2% or less with angioplasty/stenting.   2. Chronic kidney disease stage IIIa: Creatinine improved compared to yesterday, currently at 1.4.  Attempts will be made to limit use of contrast at cardiac catheterization. 3.  Hypertension: Blood pressure controlled on metoprolol, amlodipine, and IV nitroglycerin at present 4.  Mixed hyperlipidemia: Treated with rosuvastatin.  LDL cholesterol 57.  Disposition: Pending cardiac catheterization result today.  Note patient has a right radial loop consider femoral or left radial access.   For questions or updates, please contact [place] Please consult [email or web address] for contact info under        Signed, Sherren Mocha, MD  06/25/2021, 9:08 AM

## 2021-06-25 NOTE — Progress Notes (Signed)
ANTICOAGULATION CONSULT NOTE - Follow Up Consult  Pharmacy Consult for Heparin Indication: chest pain/ACS  Allergies  Allergen Reactions   Other Nausea And Vomiting and Other (See Comments)    "STRONG" PAIN MEDICATIONS IN GENERAL: HEADACHE   Tamsulosin Anaphylaxis   Codeine Nausea And Vomiting    REACTION: Headache   Morphine And Related Nausea And Vomiting   Metoprolol Itching   Oxybutynin Swelling   Pentazocine-Naloxone Hcl Nausea And Vomiting   Sertraline Swelling    Swelling in knee 100 mg   Topiramate Other (See Comments)    Headache    Patient Measurements: Height: 6\' 2"  (188 cm) Weight: 94.7 kg (208 lb 11.2 oz) IBW/kg (Calculated) : 82.2 Heparin Dosing Weight:  92.5 kg  Vital Signs: Temp: 97.7 F (36.5 C) (01/03 0756) Temp Source: Oral (01/03 0756) BP: 130/83 (01/03 1000) Pulse Rate: 47 (01/03 1000)  Labs: Recent Labs    06/23/21 0105 06/23/21 1145 06/24/21 0120 06/24/21 0711 06/25/21 0144 06/25/21 0807  HGB 14.9  --  14.9  --  15.5  --   HCT 43.6  --  44.1  --  46.3  --   PLT 195  --  206  --  210  --   HEPARINUNFRC 0.41 0.49  --  0.71* 0.48  --   CREATININE  --   --  1.79*  --   --  1.40*     Estimated Creatinine Clearance: 53.8 mL/min (A) (by C-G formula based on SCr of 1.4 mg/dL (H)).   Assessment: Anticoag: IV heparin for ACS. HL 0.48. CBC WNL. For cath and possible PCI today. No bleeding issues noted.   Goal of Therapy:  Heparin level 0.3-0.7 units/ml Monitor platelets by anticoagulation protocol: Yes   Plan:  Continue IV heparin at 1250 units/hr Daily HL and CBC   Erin Hearing PharmD., BCPS Clinical Pharmacist 06/25/2021 11:49 AM

## 2021-06-26 ENCOUNTER — Encounter (HOSPITAL_COMMUNITY): Payer: Self-pay | Admitting: Cardiology

## 2021-06-26 DIAGNOSIS — I25118 Atherosclerotic heart disease of native coronary artery with other forms of angina pectoris: Secondary | ICD-10-CM

## 2021-06-26 DIAGNOSIS — I10 Essential (primary) hypertension: Secondary | ICD-10-CM | POA: Diagnosis not present

## 2021-06-26 DIAGNOSIS — N1832 Chronic kidney disease, stage 3b: Secondary | ICD-10-CM | POA: Diagnosis not present

## 2021-06-26 LAB — CBC
HCT: 41 % (ref 39.0–52.0)
Hemoglobin: 13.7 g/dL (ref 13.0–17.0)
MCH: 32.9 pg (ref 26.0–34.0)
MCHC: 33.4 g/dL (ref 30.0–36.0)
MCV: 98.3 fL (ref 80.0–100.0)
Platelets: 198 10*3/uL (ref 150–400)
RBC: 4.17 MIL/uL — ABNORMAL LOW (ref 4.22–5.81)
RDW: 13.1 % (ref 11.5–15.5)
WBC: 8.2 10*3/uL (ref 4.0–10.5)
nRBC: 0 % (ref 0.0–0.2)

## 2021-06-26 LAB — BASIC METABOLIC PANEL
Anion gap: 7 (ref 5–15)
BUN: 18 mg/dL (ref 8–23)
CO2: 22 mmol/L (ref 22–32)
Calcium: 8.2 mg/dL — ABNORMAL LOW (ref 8.9–10.3)
Chloride: 109 mmol/L (ref 98–111)
Creatinine, Ser: 1.34 mg/dL — ABNORMAL HIGH (ref 0.61–1.24)
GFR, Estimated: 56 mL/min — ABNORMAL LOW (ref >60)
Glucose, Bld: 91 mg/dL (ref 70–99)
Potassium: 4.1 mmol/L (ref 3.5–5.1)
Sodium: 138 mmol/L (ref 135–145)

## 2021-06-26 LAB — GLUCOSE, CAPILLARY: Glucose-Capillary: 90 mg/dL (ref 70–99)

## 2021-06-26 MED ORDER — AMLODIPINE BESYLATE 10 MG PO TABS: 10.0000 mg | ORAL_TABLET | Freq: Every day | ORAL | 3 refills | Status: DC

## 2021-06-26 MED ORDER — NITROGLYCERIN 0.4 MG SL SUBL: 0.4000 mg | SUBLINGUAL_TABLET | SUBLINGUAL | 3 refills | Status: DC | PRN

## 2021-06-26 MED ORDER — ISOSORBIDE MONONITRATE ER 30 MG PO TB24: 30.0000 mg | ORAL_TABLET | Freq: Every day | ORAL | 3 refills | Status: DC

## 2021-06-26 NOTE — Progress Notes (Addendum)
Progress Note  Patient Name: Andrew Bates Date of Encounter: 06/26/2021  [facility] Cardiologist: Rozann Lesches, MD   Subjective   No chest pain this am. He is feeling well.   Inpatient Medications    Scheduled Meds:  amLODipine  10 mg Oral Daily   aspirin EC  81 mg Oral Daily   buPROPion  150 mg Oral Daily   busPIRone  10 mg Oral BID   fluorometholone  1 drop Both Eyes TID   isosorbide mononitrate  30 mg Oral Daily   mirtazapine  45 mg Oral QHS   pantoprazole  40 mg Oral Daily   rosuvastatin  20 mg Oral Daily   sodium chloride flush  3 mL Intravenous Q12H   sodium chloride flush  3 mL Intravenous Q12H   Continuous Infusions:  sodium chloride     PRN Meds: sodium chloride, acetaminophen, nitroGLYCERIN, ondansetron (ZOFRAN) IV, promethazine, sodium chloride flush   Vital Signs    Vitals:   06/25/21 1934 06/25/21 2300 06/26/21 0300 06/26/21 0730  BP: 126/86 99/63 111/62 110/62  Pulse: (!) 53 (!) 49 (!) 44 (!) 52  Resp: 16 17 15 18   Temp: 97.8 F (36.6 C) 98.2 F (36.8 C) 98 F (36.7 C) 98.4 F (36.9 C)  TempSrc: Oral Oral Oral Oral  SpO2: 93% 95% 90% 92%  Weight:      Height:        Intake/Output Summary (Last 24 hours) at 06/26/2021 0819 Last data filed at 06/26/2021 0704 Gross per 24 hour  Intake 506.89 ml  Output 1850 ml  Net -1343.11 ml   Last 3 Weights 06/25/2021 06/21/2021 09/10/2020  Weight (lbs) 208 lb 11.2 oz 204 lb 216 lb 9.6 oz  Weight (kg) 94.666 kg 92.534 kg 98.249 kg      Telemetry   Sinus - Personally Reviewed  ECG    No AM EKG- Personally Reviewed  Physical Exam    General: Well developed, well nourished, NAD  HEENT: OP clear, mucus membranes moist  SKIN: warm, dry. No rashes. Neuro: No focal deficits  Musculoskeletal: Muscle strength 5/5 all ext  Psychiatric: Mood and affect normal  Neck: No JVD  Lungs:Clear bilaterally, no wheezes, rhonci, crackles Cardiovascular: Regular rate and rhythm. No murmurs, gallops or  rubs. Abdomen:Soft. NT Extremities: No lower extremity edema.   Labs    High Sensitivity Troponin:   Recent Labs  Lab 06/21/21 1812 06/21/21 2020 06/22/21 0620 06/22/21 0837  TROPONINIHS 10 8 7 5      Chemistry Recent Labs  Lab 06/22/21 0620 06/24/21 0120 06/25/21 0807  NA 139 137 140  K 3.8 4.0 4.3  CL 109 107 109  CO2 22 23 25   GLUCOSE 96 108* 100*  BUN 21 21 16   CREATININE 1.47* 1.79* 1.40*  CALCIUM 8.6* 8.3* 8.4*  GFRNONAA 50* 39* 53*  ANIONGAP 8 7 6     Lipids  Recent Labs  Lab 06/22/21 0620  CHOL 112  TRIG 72  HDL 41  LDLCALC 57  CHOLHDL 2.7    Hematology Recent Labs  Lab 06/24/21 0120 06/25/21 0144 06/26/21 0410  WBC 8.1 8.9 8.2  RBC 4.49 4.72 4.17*  HGB 14.9 15.5 13.7  HCT 44.1 46.3 41.0  MCV 98.2 98.1 98.3  MCH 33.2 32.8 32.9  MCHC 33.8 33.5 33.4  RDW 13.0 12.9 13.1  PLT 206 210 198   Thyroid No results for input(s): TSH, FREET4 in the last 168 hours.  BNPNo results for input(s): BNP, PROBNP in  the last 168 hours.  DDimer No results for input(s): DDIMER in the last 168 hours.   Radiology    CARDIAC CATHETERIZATION  Result Date: 06/25/2021   Prox LAD lesion is 50% stenosed. - RFR 0.98   Ost LAD to Prox LAD lesion is 30% stenosed.   Dist LM to Ost LAD lesion is 50% stenosed. - RFR 0.92-0.93 9NOT SIGNIFICANT)   Prox Cx lesion is 20% stenosed.   LV end diastolic pressure is normal.   There is no aortic valve stenosis. Stable ostial and mid LAD disease-RFR nonischemic. Consider either noncardiac cause of chest pain versus coronary spasm. Recommendations: Monitor this evening post procedure for TR band removal. Discontinue IV heparin, discontinue IV nitroglycerin-increase amlodipine to 10 mg and add Imdur. Despite presence of coronary disease, will stop beta-blocker due to bradycardia. Glenetta Hew, MD   Cardiac Studies   2D echocardiogram 06/22/2021  1. Left ventricular ejection fraction, by estimation, is 60 to 65%. The  left ventricle has  normal function. The left ventricle has no regional  wall motion abnormalities. Left ventricular diastolic parameters are  consistent with Grade I diastolic  dysfunction (impaired relaxation).   2. Right ventricular systolic function is normal. The right ventricular  size is normal. There is normal pulmonary artery systolic pressure. The  estimated right ventricular systolic pressure is 27.0 mmHg.   3. The mitral valve is grossly normal. Trivial mitral valve  regurgitation.   4. The aortic valve is tricuspid. Aortic valve regurgitation is trivial.   5. The inferior vena cava is normal in size with greater than 50%  respiratory variability, suggesting right atrial pressure of 3 mmHg.   Patient Profile     76 y.o. male with known mild to moderate ostial LAD stenosis identified at cardiac catheterization approximately 3 years ago, now presenting with symptoms concerning for unstable angina  Assessment & Plan    1.  CAD with angina: Pt admitted with chest pain. Troponin negative. Cardiac cath 06/25/21 with stable CAD. RFR of LAD suggested the disease in the LAD is not flow limiting. Norvasc increased to 10 mg daily and Imdur 30 mg daily added. Beta blocker stopped due to bradycardia.    2. Chronic kidney disease stage IIIa: BMET not ordered this am. Will order now and if creatinine stable, he can be discharged.    3.  Hypertension: Blood pressure controlled on current therapy  4.  Mixed hyperlipidemia: Treated with rosuvastatin.  LDL cholesterol 57.  Discharge home today: New medication is Imdur. Norvasc was increased to 10 mg. He would also like a refill on his NTG SL prn tabs.   Addendum: Renal function stable. D/c home today  For questions or updates, please contact [place] Please consult [email or web address] for contact info under        Signed, Lauree Chandler, MD  06/26/2021, 8:19 AM

## 2021-06-26 NOTE — Discharge Summary (Signed)
Discharge Summary    Patient ID: Andrew Bates MRN: [id number]; DOB: Feb 05, 1946  Admit date: 06/21/2021 Discharge date: 06/26/2021  PCP:  Sharilyn Sites, MD   [facility] Providers Cardiologist:  Rozann Lesches, MD   {   Discharge Diagnoses    Principal Problem:   Unstable angina [facility]) Active Problems:   Essential hypertension   Hyperlipidemia with target LDL less than 70   Coronary artery disease, non-occlusive-by cardiac catheterization in 2020   Stage 3b chronic kidney disease (CKD) ([place])   Sinus bradycardia on metoprolol   Diagnostic Studies/Procedures     INTRAVASCULAR PRESSURE WIRE/FFR STUDY  LEFT HEART CATH AND CORONARY ANGIOGRAPHY   Conclusion      Prox LAD lesion is 50% stenosed. - RFR 0.98   Ost LAD to Prox LAD lesion is 30% stenosed.   Dist LM to Ost LAD lesion is 50% stenosed. - RFR 0.92-0.93 9NOT SIGNIFICANT)   Prox Cx lesion is 20% stenosed.   LV end diastolic pressure is normal.   There is no aortic valve stenosis.   Stable ostial and mid LAD disease-RFR nonischemic. Consider either noncardiac cause of chest pain versus coronary spasm.     Recommendations: Monitor this evening post procedure for TR band removal. Discontinue IV heparin, discontinue IV nitroglycerin-increase amlodipine to 10 mg and add Imdur. Despite presence of coronary disease, will stop beta-blocker due to bradycardia. Diagnostic Dominance: Right  2D echocardiogram 06/22/2021  1. Left ventricular ejection fraction, by estimation, is 60 to 65%. The  left ventricle has normal function. The left ventricle has no regional  wall motion abnormalities. Left ventricular diastolic parameters are  consistent with Grade I diastolic  dysfunction (impaired relaxation).   2. Right ventricular systolic function is normal. The right ventricular  size is normal. There is normal pulmonary artery systolic pressure. The  estimated right ventricular systolic pressure is 03.4 mmHg.   3. The  mitral valve is grossly normal. Trivial mitral valve  regurgitation.   4. The aortic valve is tricuspid. Aortic valve regurgitation is trivial.   5. The inferior vena cava is normal in size with greater than 50%  respiratory variability, suggesting right atrial pressure of 3 mmHg.   History of Present Illness     Andrew Bates is a 76 y.o. male with hx of HTN,, HLD, Non obstructive CAD on 2020 LHC, h/o PVCs and GERD presented with chest pain concerning to [place].   Cath 07/2018 showed moderate (40-50%) ostial LAD stenosis. This is a smooth lesion and does not appear to be flow limiting. Mild non-obstructive disease in the proximal Circumflex artery. Medical therapy recommended.   Patient reported on and off chest pain going on for 1-1/5 months, pressure like sensation.  This got worse radiating to the shoulder and elbow. He again the pain returned with much higher intensity needing to take NTGx3 but did not resolve thus came to the ER. He got aspirin and zofran, In the ER and his chest pain had already subsided to 2/10.  Hospital Course     Consultants: None   Unstable angina - Troponin negative. Treated with IV heparin and nitro gtt. Echo showed preserved LV function without WM abnormality. Cardiac cath showed stable CAD. RFR of RFR of LAD suggested the disease in the LAD is not flow limiting. Given sinus bradycardia, discontinued metoprolol 12.5mg  BID. Increased amlodipine to 10mg  daily (? Prior hx of swelling on higher dose) and added Imdur 30mg  daily for antianginal. No chest pain at discharge.  2. CKD IIIa - Scr stable at discharge 1.34 (baseline 1.3-1.4) -Hydrated prior to cath - will resume home Losartan   3. HTN - BP stable. Meds changes as above  4. HLD - 06/22/2021: Cholesterol 112; HDL 41; LDL Cholesterol 57; Triglycerides 72; VLDL 14  - Continue Crestor 20mg  qd   5. Sinus bradycardia - Discontinued metoprolol 12.5mg  BID - Follow in outpatient setting. HR in 49s  Did the  patient have an acute coronary syndrome (MI, NSTEMI, STEMI, etc) this admission?:  No                               Did the patient have a percutaneous coronary intervention (stent / angioplasty)?:  No.     Discharge Vitals Blood pressure 114/63, pulse (!) 52, temperature 98.4 F (36.9 C), temperature source Oral, resp. rate 18, height 6\' 2"  (1.88 m), weight 94.7 kg, SpO2 92 %.  Filed Weights   06/21/21 1802 06/25/21 0709  Weight: 92.5 kg 94.7 kg    Labs & Radiologic Studies    CBC Recent Labs    06/25/21 0144 06/26/21 0410  WBC 8.9 8.2  HGB 15.5 13.7  HCT 46.3 41.0  MCV 98.1 98.3  PLT 210 270   Basic Metabolic Panel Recent Labs    06/25/21 0807 06/26/21 0410  NA 140 138  K 4.3 4.1  CL 109 109  CO2 25 22  GLUCOSE 100* 91  BUN 16 18  CREATININE 1.40* 1.34*  CALCIUM 8.4* 8.2*    High Sensitivity Troponin:   Recent Labs  Lab 06/21/21 1812 06/21/21 2020 06/22/21 0620 06/22/21 0837  TROPONINIHS 10 8 7 5     _____________  DG Chest 2 View  Result Date: 06/21/2021 CLINICAL DATA:  Left-sided chest pressure starting yesterday, radiating to left shoulder EXAM: CHEST - 2 VIEW COMPARISON:  06/14/2019 FINDINGS: Frontal and lateral views of the chest demonstrate an unremarkable cardiac silhouette. Lung volumes are diminished, with hypoventilatory changes at the lung bases. No airspace disease, effusion, or pneumothorax. No acute bony abnormalities. IMPRESSION: 1. Hypoventilatory changes.  No acute process. Electronically Signed   By: Randa Ngo M.D.   On: 06/21/2021 18:57   CARDIAC CATHETERIZATION  Result Date: 06/25/2021   Prox LAD lesion is 50% stenosed. - RFR 0.98   Ost LAD to Prox LAD lesion is 30% stenosed.   Dist LM to Ost LAD lesion is 50% stenosed. - RFR 0.92-0.93 9NOT SIGNIFICANT)   Prox Cx lesion is 20% stenosed.   LV end diastolic pressure is normal.   There is no aortic valve stenosis. Stable ostial and mid LAD disease-RFR nonischemic. Consider either  noncardiac cause of chest pain versus coronary spasm. Recommendations: Monitor this evening post procedure for TR band removal. Discontinue IV heparin, discontinue IV nitroglycerin-increase amlodipine to 10 mg and add Imdur. Despite presence of coronary disease, will stop beta-blocker due to bradycardia. Glenetta Hew, MD  ECHOCARDIOGRAM COMPLETE  Result Date: 06/22/2021    ECHOCARDIOGRAM REPORT   Patient Name:   Andrew Bates Date of Exam: 06/22/2021 Medical Rec #:  [id number]      Height:       74.0 in Accession #:    [id number]     Weight:       204.0 lb Date of Birth:  11-May-1946      BSA:          2.192 m Patient Age:    74 years  BP:           131/84 mmHg Patient Gender: M              HR:           58 bpm. Exam Location:  Inpatient Procedure: 2D Echo, Color Doppler and Cardiac Doppler Indications:    Chest pain  History:        Patient has prior history of Echocardiogram examinations, most                 recent 08/18/2018. CAD; Risk Factors:Hypertension.  Sonographer:    Jyl Heinz Referring Phys: [id number] ROBIN Mulberry  1. Left ventricular ejection fraction, by estimation, is 60 to 65%. The left ventricle has normal function. The left ventricle has no regional wall motion abnormalities. Left ventricular diastolic parameters are consistent with Grade I diastolic dysfunction (impaired relaxation).  2. Right ventricular systolic function is normal. The right ventricular size is normal. There is normal pulmonary artery systolic pressure. The estimated right ventricular systolic pressure is 66.5 mmHg.  3. The mitral valve is grossly normal. Trivial mitral valve regurgitation.  4. The aortic valve is tricuspid. Aortic valve regurgitation is trivial.  5. The inferior vena cava is normal in size with greater than 50% respiratory variability, suggesting right atrial pressure of 3 mmHg. Comparison(s): Changes from prior study are noted. 08/18/2018: LVEF 60-65%, mild AI, dilated aortic root to  40 mm. FINDINGS  Left Ventricle: Left ventricular ejection fraction, by estimation, is 60 to 65%. The left ventricle has normal function. The left ventricle has no regional wall motion abnormalities. The left ventricular internal cavity size was normal in size. There is  no left ventricular hypertrophy. Left ventricular diastolic parameters are consistent with Grade I diastolic dysfunction (impaired relaxation). Indeterminate filling pressures. Right Ventricle: The right ventricular size is normal. No increase in right ventricular wall thickness. Right ventricular systolic function is normal. There is normal pulmonary artery systolic pressure. The tricuspid regurgitant velocity is 2.60 m/s, and  with an assumed right atrial pressure of 3 mmHg, the estimated right ventricular systolic pressure is 99.3 mmHg. Left Atrium: Left atrial size was normal in size. Right Atrium: Right atrial size was normal in size. Pericardium: There is no evidence of pericardial effusion. Mitral Valve: The mitral valve is grossly normal. Trivial mitral valve regurgitation. Tricuspid Valve: The tricuspid valve is grossly normal. Tricuspid valve regurgitation is mild. Aortic Valve: The aortic valve is tricuspid. Aortic valve regurgitation is trivial. Aortic valve peak gradient measures 6.9 mmHg. Pulmonic Valve: The pulmonic valve was normal in structure. Pulmonic valve regurgitation is not visualized. Aorta: The aortic root and ascending aorta are structurally normal, with no evidence of dilitation. Venous: The inferior vena cava is normal in size with greater than 50% respiratory variability, suggesting right atrial pressure of 3 mmHg. IAS/Shunts: No atrial level shunt detected by color flow Doppler.  LEFT VENTRICLE PLAX 2D LVIDd:         5.70 cm      Diastology LVIDs:         3.70 cm      LV e' medial:    7.62 cm/s LV PW:         0.90 cm      LV E/e' medial:  8.3 LV IVS:        1.00 cm      LV e' lateral:   8.16 cm/s LVOT diam:     2.00 cm  LV E/e' lateral: 7.8 LV SV:         68 LV SV Index:   31 LVOT Area:     3.14 cm  LV Volumes (MOD) LV vol d, MOD A2C: 126.0 ml LV vol d, MOD A4C: 159.0 ml LV vol s, MOD A2C: 51.4 ml LV vol s, MOD A4C: 66.1 ml LV SV MOD A2C:     74.6 ml LV SV MOD A4C:     159.0 ml LV SV MOD BP:      83.9 ml RIGHT VENTRICLE             IVC RV Basal diam:  3.40 cm     IVC diam: 1.60 cm RV Mid diam:    3.00 cm RV S prime:     16.20 cm/s TAPSE (M-mode): 2.3 cm LEFT ATRIUM             Index        RIGHT ATRIUM           Index LA diam:        3.80 cm 1.73 cm/m   RA Area:     18.50 cm LA Vol (A2C):   53.8 ml 24.55 ml/m  RA Volume:   51.50 ml  23.50 ml/m LA Vol (A4C):   43.2 ml 19.71 ml/m LA Biplane Vol: 48.2 ml 21.99 ml/m  AORTIC VALVE AV Area (Vmax): 2.16 cm AV Vmax:        131.00 cm/s AV Peak Grad:   6.9 mmHg LVOT Vmax:      90.10 cm/s LVOT Vmean:     69.200 cm/s LVOT VTI:       0.215 m  AORTA Ao Root diam: 3.50 cm Ao Asc diam:  3.70 cm MITRAL VALVE               TRICUSPID VALVE MV Area (PHT): 4.12 cm    TR Peak grad:   27.0 mmHg MV Decel Time: 184 msec    TR Vmax:        260.00 cm/s MV E velocity: 63.40 cm/s MV A velocity: 69.80 cm/s  SHUNTS MV E/A ratio:  0.91        Systemic VTI:  0.22 m                            Systemic Diam: 2.00 cm Lyman Bishop MD Electronically signed by Lyman Bishop MD Signature Date/Time: 06/22/2021/2:43:34 PM    Final    Disposition   Pt is being discharged home today in good condition.  Follow-up Plans & Appointments     Follow-up Information     Imogene Burn, PA-C Follow up on 08/05/2021.   Specialty: Cardiology Why: 1:30pm for hospital follow up Contact information: [address] [place] [address] [phone]                Discharge Instructions     Diet - low sodium heart healthy   Complete by: As directed    Discharge instructions   Complete by: As directed    No driving for 48 hours. No lifting over 5 lbs for 1 week. No sexual activity for 1 week.  Keep procedure site clean & dry. If you notice increased pain, swelling, bleeding or pus, call/return!  You may shower, but no soaking baths/hot tubs/pools for 1 week.   Increase activity slowly   Complete by: As directed  Discharge Medications   Allergies as of 06/26/2021       Reactions   Other Nausea And Vomiting, Other (See Comments)   "STRONG" PAIN MEDICATIONS IN GENERAL: HEADACHE   Tamsulosin Anaphylaxis   Codeine Nausea And Vomiting   REACTION: Headache   Morphine And Related Nausea And Vomiting   Metoprolol Itching   Oxybutynin Swelling   Pentazocine-naloxone Hcl Nausea And Vomiting   Sertraline Swelling   Swelling in knee 100 mg   Topiramate Other (See Comments)   Headache        Medication List     TAKE these medications    acetaminophen 500 MG tablet Commonly known as: TYLENOL Take 500 mg by mouth every 6 (six) hours as needed for fever.   amLODipine 10 MG tablet Commonly known as: NORVASC Take 1 tablet (10 mg total) by mouth daily. What changed:  medication strength how much to take Another medication with the same name was removed. Continue taking this medication, and follow the directions you see here.   aspirin 81 MG EC tablet Take 1 tablet (81 mg total) by mouth daily.   Biotin 5 MG Caps Take 10 mg by mouth daily.   buPROPion 150 MG 24 hr tablet Commonly known as: WELLBUTRIN XL Take 150 mg by mouth daily.   busPIRone 10 MG tablet Commonly known as: BUSPAR Take 10 mg by mouth 2 (two) times daily. anxiety   diazepam 10 MG tablet Commonly known as: VALIUM Take 10 mg by mouth 2 (two) times daily as needed for anxiety.   DRY EYES OP Apply 1 application to eye as needed (dry eyes).   fluorometholone 0.1 % ophthalmic suspension Commonly known as: FML Place 1 drop into both eyes 3 (three) times daily.   isosorbide mononitrate 30 MG 24 hr tablet Commonly known as: IMDUR Take 1 tablet (30 mg total) by mouth daily. Start taking on:  June 27, 2021   losartan 50 MG tablet Commonly known as: COZAAR TAKE 1 TABLET BY MOUTH TWICE A DAILY. What changed: See the new instructions.   mirtazapine 45 MG tablet Commonly known as: REMERON Take 45 mg by mouth at bedtime.   mirtazapine 45 MG tablet Commonly known as: REMERON Take 1 tablet by mouth at bedtime.   nitroGLYCERIN 0.4 MG SL tablet Commonly known as: NITROSTAT Place 1 tablet (0.4 mg total) under the tongue every 5 (five) minutes x 3 doses as needed for chest pain.   omeprazole 20 MG capsule Commonly known as: PRILOSEC Take 20 mg by mouth every morning.   ondansetron 4 MG disintegrating tablet Commonly known as: Zofran ODT Take 1 tablet (4 mg total) by mouth every 8 (eight) hours as needed for nausea or vomiting.   promethazine 25 MG tablet Commonly known as: PHENERGAN Take 25 mg by mouth every 6 (six) hours as needed for nausea.   psyllium 58.6 % powder Commonly known as: METAMUCIL Take 1 packet by mouth daily.   rosuvastatin 20 MG tablet Commonly known as: CRESTOR TAKE 1 TABLET BY MOUTH AT 6PM. What changed: See the new instructions.           Outstanding Labs/Studies  BMP at follow up   Duration of Discharge Encounter   Greater than 30 minutes including physician time.  Jarrett Soho, PA 06/26/2021, 11:06 AM

## 2021-06-26 NOTE — TOC Transition Note (Signed)
Transition of Care [facility]) - CM/SW Discharge Note   Patient Details  Name: Andrew Bates MRN: [id number] Date of Birth: 1946-02-03  Transition of Care [facility]) CM/SW Contact:  Zenon Mayo, RN Phone Number: 06/26/2021, 12:02 PM   Clinical Narrative:    NCM spoke with patient, he states he notified the [place] that he was admitted to hospital.  He has no needs. He is for dc today.   Final next level of care: Home/Self Care Barriers to Discharge: No Barriers Identified   Patient Goals and CMS Choice Patient states their goals for this hospitalization and ongoing recovery are:: return home   Choice offered to / list presented to : NA  Discharge Placement                       Discharge Plan and Services                  DME Agency: NA       HH Arranged: NA          Social Determinants of Health (SDOH) Interventions     Readmission Risk Interventions No flowsheet data found.

## 2021-06-26 NOTE — Progress Notes (Signed)
RN went over d/c summary with pt and pt's wife. NT removing PIVs. Belongings with pt. NT transporting pt to private vehicle where pt's wife will transport him home.

## 2021-06-28 DIAGNOSIS — E782 Mixed hyperlipidemia: Secondary | ICD-10-CM | POA: Diagnosis not present

## 2021-06-28 DIAGNOSIS — I2510 Atherosclerotic heart disease of native coronary artery without angina pectoris: Secondary | ICD-10-CM | POA: Diagnosis not present

## 2021-06-28 DIAGNOSIS — E669 Obesity, unspecified: Secondary | ICD-10-CM | POA: Diagnosis not present

## 2021-06-28 DIAGNOSIS — G64 Other disorders of peripheral nervous system: Secondary | ICD-10-CM | POA: Diagnosis not present

## 2021-06-28 DIAGNOSIS — I10 Essential (primary) hypertension: Secondary | ICD-10-CM | POA: Diagnosis not present

## 2021-06-28 DIAGNOSIS — Z6829 Body mass index (BMI) 29.0-29.9, adult: Secondary | ICD-10-CM | POA: Diagnosis not present

## 2021-06-28 DIAGNOSIS — F4312 Post-traumatic stress disorder, chronic: Secondary | ICD-10-CM | POA: Diagnosis not present

## 2021-06-28 DIAGNOSIS — R42 Dizziness and giddiness: Secondary | ICD-10-CM | POA: Diagnosis not present

## 2021-07-22 DIAGNOSIS — H04123 Dry eye syndrome of bilateral lacrimal glands: Secondary | ICD-10-CM | POA: Diagnosis not present

## 2021-07-24 NOTE — Progress Notes (Signed)
Cardiology Office Note    Date:  08/05/2021   ID:  Andrew Bates [birth year], MRN [id number]   PCP:  Andrew Bates, [place]  Cardiologist:  Andrew Lesches, MD   Advanced Practice Provider:  No care team member to display Electrophysiologist:  None   [id number]   Chief Complaint  Patient presents with   Hospitalization Follow-up    History of Present Illness:  Andrew Bates is a 76 y.o. male  with hx of HTN,, HLD, Non obstructive CAD on 2020 LHC, h/o PVCs and GERD   Patient admitted with unstable angina 06/25/2021 troponins negative Echo with preserved LV function no wall motion abnormalities.  Cardiac cath stable CAD RFR of LAD not flow-limiting.  Metoprolol stopped because of sinus bradycardia and amlodipine increased to 10 mg a day Imdur added.  Patient comes in with his wife. He's had very little chest pain. Occasional chest tightness while driving [place] an interstate and upset. No exertional symptoms.occurred laying down once or while in his recliner. Wear a CPAP. Started walking again. Has some DOE and wheezing at times-used to be on an inhaler but not refilled through th [place].  Past Medical History:  Diagnosis Date   Adenomatous colon polyp    Anxiety    Arthritis    Asthma    Coronary artery disease    Nonobstructive at cardiac catheterization 2020   Depression    DVT (deep venous thrombosis) ([place]) 2008   Essential hypertension    GERD (gastroesophageal reflux disease)    History of kidney stones    Hyperlipidemia    Internal hemorrhoids    Peripheral neuropathy    PTSD (post-traumatic stress disorder)    Sleep apnea    cpap   Vertigo     Past Surgical History:  Procedure Laterality Date   ANTERIOR CERVICAL DECOMP/DISCECTOMY FUSION N/A 07/09/2015   Procedure: ANTERIOR CERVICAL DECOMPRESSION/DISCECTOMY FUSION CERVICAL FIVE-SIX,CERVICAL SIX-SEVEN;  Surgeon: Andrew Pies, MD;  Location: [place] [facility];  Service: Neurosurgery;   Laterality: N/A;   CARPAL TUNNEL RELEASE Bilateral 2014   COLONOSCOPY     CYSTOSCOPY/RETROGRADE/URETEROSCOPY/STONE EXTRACTION WITH BASKET Left 02/14/2014   Procedure: CYSTOSCOPY AND LEFT DOUBLE J STENT PLACEMENT;  ATTEMPTED STONE  EXTRACTION ;  Surgeon: Andrew Loa, MD;  Location: [facility];  Service: Urology;  Laterality: Left;   ENDOLYMPHATIC SAC DECOMPRESSION Left 06/14/2019   Procedure: [facility] DECOMPRESSION;  Surgeon: Andrew Baptist, MD;  Location: [place];  Service: ENT;  Laterality: Left;   ESOPHAGOGASTRODUODENOSCOPY ENDOSCOPY     INTRAVASCULAR PRESSURE WIRE/FFR STUDY N/A 06/25/2021   Procedure: INTRAVASCULAR PRESSURE WIRE/FFR STUDY;  Surgeon: Andrew Man, MD;  Location: [place] [facility];  Service: Cardiovascular;  Laterality: N/A;   KNEE SURGERY Right 2006   Arthroscopy - MCl tear   LEFT HEART CATH AND CORONARY ANGIOGRAPHY N/A 08/17/2018   Procedure: LEFT HEART CATH AND CORONARY ANGIOGRAPHY;  Surgeon: Andrew Blanks, MD;  Location: [place] [facility];  Service: Cardiovascular;  Laterality: N/A;   LEFT HEART CATH AND CORONARY ANGIOGRAPHY N/A 06/25/2021   Procedure: LEFT HEART CATH AND CORONARY ANGIOGRAPHY;  Surgeon: Andrew Man, MD;  Location: [place] [facility];  Service: Cardiovascular;  Laterality: N/A;   UPPER GASTROINTESTINAL ENDOSCOPY      Current Medications: Current Meds  Medication Sig   acetaminophen (TYLENOL) 500 MG tablet Take 500 mg by mouth every 6 (six) hours as needed for fever.  Artificial Tear Ointment (DRY EYES OP) Apply 1 application to eye as needed (dry eyes).    aspirin EC 81 MG EC tablet Take 1 tablet (81 mg total) by mouth daily.   Biotin 5 MG CAPS Take 10 mg by mouth daily.    buPROPion (WELLBUTRIN XL) 150 MG 24 hr tablet Take 150 mg by mouth daily.   busPIRone (BUSPAR) 10 MG tablet Take 10 mg by mouth 2 (two) times daily. anxiety   diazepam (VALIUM) 10 MG tablet Take 10 mg by mouth 2 (two) times daily as needed  for anxiety.   fluorometholone (FML) 0.1 % ophthalmic suspension Place 1 drop into both eyes 3 (three) times daily.    isosorbide mononitrate (IMDUR) 30 MG 24 hr tablet Take 1 tablet (30 mg total) by mouth daily.   losartan (COZAAR) 50 MG tablet TAKE 1 TABLET BY MOUTH TWICE A DAILY. (Patient taking differently: Take 50 mg by mouth 2 (two) times daily.)   mirtazapine (REMERON) 45 MG tablet Take 45 mg by mouth at bedtime.   nitroGLYCERIN (NITROSTAT) 0.4 MG SL tablet Place 1 tablet (0.4 mg total) under the tongue every 5 (five) minutes x 3 doses as needed for chest pain.   omeprazole (PRILOSEC) 20 MG capsule Take 20 mg by mouth every morning.    ondansetron (ZOFRAN ODT) 4 MG disintegrating tablet Take 1 tablet (4 mg total) by mouth every 8 (eight) hours as needed for nausea or vomiting.   promethazine (PHENERGAN) 25 MG tablet Take 25 mg by mouth every 6 (six) hours as needed for nausea.   rosuvastatin (CRESTOR) 20 MG tablet TAKE 1 TABLET BY MOUTH AT 6PM. (Patient taking differently: Take 20 mg by mouth daily.)     Allergies:   Other, Tamsulosin, Codeine, Morphine and related, Metoprolol, Oxybutynin, Pentazocine-naloxone hcl, Sertraline, and Topiramate   Social History   Socioeconomic History   Marital status: Married    Spouse name: Andrew Bates   Number of children: 2   Years of education: Not on file   Highest education level: Not on file  Occupational History   Occupation: retired/diabled  Tobacco Use   Smoking status: Former    Types: Cigars    Quit date: 1994    Years since quitting: 29.1   Smokeless tobacco: Never   Tobacco comments:    quit 15 years ago (2017) never smoked alot  Vaping Use   Vaping Use: Never used  Substance and Sexual Activity   Alcohol use: No   Drug use: No   Sexual activity: Not on file  Other Topics Concern   Not on file  Social History Narrative   Not on file   Social Determinants of Health   Financial Resource Strain: Not on file  Food Insecurity: Not  on file  Transportation Needs: Not on file  Physical Activity: Not on file  Stress: Not on file  Social Connections: Not on file      Family History:  The patient's  family history includes Bladder Cancer in his brother; Colon cancer (age of onset: 35) in his brother; Colon polyps in his brother; Hyperlipidemia in his father; Hypertension in his father; Lung cancer in his father; Thyroid cancer in his mother.   ROS:   Please see the history of present illness.    ROS All other systems reviewed and are negative.   PHYSICAL EXAM:   VS:  BP 116/68    Pulse (!) 48    Ht 6\' 2"  (1.88 m)  Wt 216 lb (98 kg)    SpO2 94%    BMI 27.73 kg/m   Physical Exam  GEN: Well nourished, well developed, in no acute distress  Neck: no JVD, carotid bruits, or masses Cardiac:RRR; no murmurs, rubs, or gallops  Respiratory:  clear to auscultation bilaterally, normal work of breathing GI: soft, nontender, nondistended, + BS Ext: without cyanosis, clubbing, or edema, Good distal pulses bilaterally Neuro:  Alert and Oriented x 3 Psych: euthymic mood, full affect  Wt Readings from Last 3 Encounters:  08/05/21 216 lb (98 kg)  06/25/21 208 lb 11.2 oz (94.7 kg)  09/10/20 216 lb 9.6 oz (98.2 kg)      Studies/Labs Reviewed:   EKG:  EKG is not ordered today.     Recent Labs: 06/26/2021: BUN 18; Creatinine, Ser 1.34; Hemoglobin 13.7; Platelets 198; Potassium 4.1; Sodium 138   Lipid Panel    Component Value Date/Time   CHOL 112 06/22/2021 0620   TRIG 72 06/22/2021 0620   HDL 41 06/22/2021 0620   CHOLHDL 2.7 06/22/2021 0620   VLDL 14 06/22/2021 0620   LDLCALC 57 06/22/2021 0620    Additional studies/ records that were reviewed today include:  Cath 06/26/21 INTRAVASCULAR PRESSURE WIRE/FFR STUDY  LEFT HEART CATH AND CORONARY ANGIOGRAPHY    Conclusion       Prox LAD lesion is 50% stenosed. - RFR 0.98   Ost LAD to Prox LAD lesion is 30% stenosed.   Dist LM to Ost LAD lesion is 50% stenosed. - RFR  0.92-0.93 9NOT SIGNIFICANT)   Prox Cx lesion is 20% stenosed.   LV end diastolic pressure is normal.   There is no aortic valve stenosis.   Stable ostial and mid LAD disease-RFR nonischemic. Consider either noncardiac cause of chest pain versus coronary spasm.     Recommendations: Monitor this evening post procedure for TR band removal. Discontinue IV heparin, discontinue IV nitroglycerin-increase amlodipine to 10 mg and add Imdur. Despite presence of coronary disease, will stop beta-blocker due to bradycardia. Diagnostic Dominance: Right  2D echocardiogram 06/22/2021  1. Left ventricular ejection fraction, by estimation, is 60 to 65%. The  left ventricle has normal function. The left ventricle has no regional  wall motion abnormalities. Left ventricular diastolic parameters are  consistent with Grade I diastolic  dysfunction (impaired relaxation).   2. Right ventricular systolic function is normal. The right ventricular  size is normal. There is normal pulmonary artery systolic pressure. The  estimated right ventricular systolic pressure is 28.3 mmHg.   3. The mitral valve is grossly normal. Trivial mitral valve  regurgitation.   4. The aortic valve is tricuspid. Aortic valve regurgitation is trivial.   5. The inferior vena cava is normal in size with greater than 50%  respiratory variability, suggesting right atrial pressure of 3 mmHg.   Risk Assessment/Calculations:         ASSESSMENT:    1. Coronary artery disease involving native coronary artery of native heart without angina pectoris   2. Bradycardia   3. Essential hypertension   4. Other hyperlipidemia   5. Stage 3b chronic kidney disease (CKD) (HCC)      PLAN:  In order of problems listed above:  CAD nonobstructive on cardiac cath 06/26/2021 medical therapy recommended. Occasional angina when under stressful situation like highway driving, rest. No exertional symptoms continue Imdur and amlodipine. Increase  exercise and weight loss  Sinus bradycardia metoprolol stopped  Hypertension BP controlled  HLD LDL 57 05/2021 on crestor  CKD stage IIIa creatinine 1.34 at discharge  Shared Decision Making/Informed Consent        Medication Adjustments/Labs and Tests Ordered: Current medicines are reviewed at length with the patient today.  Concerns regarding medicines are outlined above.  Medication changes, Labs and Tests ordered today are listed in the Patient Instructions below. There are no Patient Instructions on file for this visit.   Sumner Boast, PA-C  08/05/2021 1:54 PM    [facility] [address], [place], [place]  [address] Phone: [phone]; Fax: [phone]

## 2021-08-05 ENCOUNTER — Encounter: Payer: Self-pay | Admitting: Physician Assistant

## 2021-08-05 ENCOUNTER — Ambulatory Visit (INDEPENDENT_AMBULATORY_CARE_PROVIDER_SITE_OTHER): Payer: PPO | Admitting: Physician Assistant

## 2021-08-05 ENCOUNTER — Other Ambulatory Visit: Payer: Self-pay

## 2021-08-05 VITALS — BP 116/68 | HR 48 | Ht 74.0 in | Wt 216.0 lb

## 2021-08-05 DIAGNOSIS — R001 Bradycardia, unspecified: Secondary | ICD-10-CM

## 2021-08-05 DIAGNOSIS — E7849 Other hyperlipidemia: Secondary | ICD-10-CM

## 2021-08-05 DIAGNOSIS — I10 Essential (primary) hypertension: Secondary | ICD-10-CM

## 2021-08-05 DIAGNOSIS — N1832 Chronic kidney disease, stage 3b: Secondary | ICD-10-CM | POA: Diagnosis not present

## 2021-08-05 DIAGNOSIS — I2510 Atherosclerotic heart disease of native coronary artery without angina pectoris: Secondary | ICD-10-CM

## 2021-08-05 NOTE — Patient Instructions (Signed)
Medication Instructions:  Your physician recommends that you continue on your current medications as directed. Please refer to the Current Medication list given to you today.  *If you need a refill on your cardiac medications before your next appointment, please call your pharmacy*   Lab Work: NONE   If you have labs (blood work) drawn today and your tests are completely normal, you will receive your results only by: [place] (if you have MyChart) OR A paper copy in the mail If you have any lab test that is abnormal or we need to change your treatment, we will call you to review the results.   Testing/Procedures: NONE    Follow-Up: At [facility], you and your health needs are our priority.  As part of our continuing mission to provide you with exceptional heart care, we have created designated Provider Care Teams.  These Care Teams include your primary Cardiologist (physician) and Advanced Practice Providers (APPs -  Physician Assistants and Nurse Practitioners) who all work together to provide you with the care you need, when you need it.  We recommend signing up for the patient portal called "MyChart".  Sign up information is provided on this After Visit Summary.  MyChart is used to connect with patients for Virtual Visits (Telemedicine).  Patients are able to view lab/test results, encounter notes, upcoming appointments, etc.  Non-urgent messages can be sent to your provider as well.   To learn more about what you can do with MyChart, go to [email or web address].    Your next appointment:   4 -5 month(s)  The format for your next appointment:   In Person  Provider:   Rozann Lesches, MD    Other Instructions Thank you for choosing [place]!

## 2021-08-15 ENCOUNTER — Other Ambulatory Visit: Payer: Self-pay | Admitting: Cardiology

## 2021-08-16 ENCOUNTER — Telehealth: Payer: Self-pay

## 2021-08-16 MED ORDER — AMLODIPINE BESYLATE 5 MG PO TABS: 5.0000 mg | ORAL_TABLET | Freq: Every day | ORAL | 3 refills | Status: DC

## 2021-08-16 NOTE — Telephone Encounter (Signed)
Pharmacy sent a refill request for Amlodipine 5 mg, but 10 mg is on his chart. Pt reported taking 2.5 mg tablets QD. Please clarify.

## 2021-09-12 ENCOUNTER — Telehealth: Payer: Self-pay | Admitting: Cardiology

## 2021-09-12 NOTE — Telephone Encounter (Signed)
Left message to return call 

## 2021-09-12 NOTE — Telephone Encounter (Signed)
New Message: ? ? ?Please call, concerning his medicine. He said he does not want to take no more medicine than he heave to. He wants to know why he needs to take Amlodipine  ?

## 2021-10-21 IMAGING — MR MR HEAD W/O CM
12 of 13 series · 44 of 48 positions shown · non-contrast
Comparison: 02/25/2015

CLINICAL DATA: Vertigo

EXAM:
MRI HEAD WITHOUT CONTRAST
TECHNIQUE: Multiplanar, multiecho pulse sequences of the brain and surrounding
structures were obtained without intravenous contrast.

[Series 5: DWI · axial · 3.0mm · 0.88mm/px · z∈[-51,+89]mm · 8 of 96 slices shown (1 of 4)]
[im 1/96]
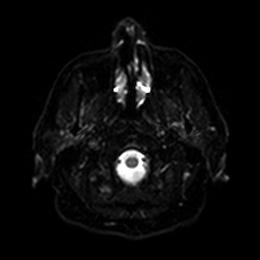
[im 14/96]
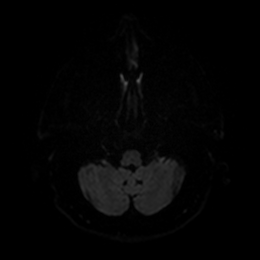
[im 28/96]
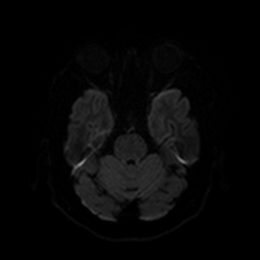
[im 41/96]
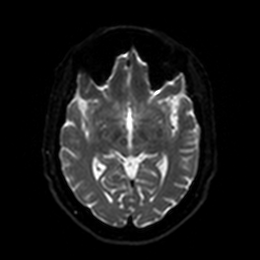
[im 55/96]
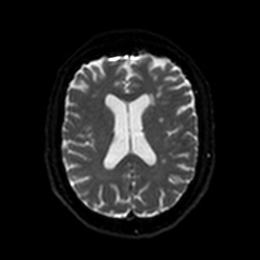
[im 68/96]
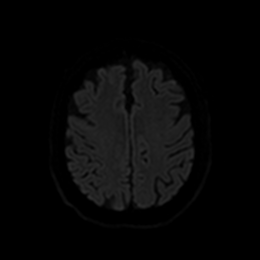
[im 82/96]
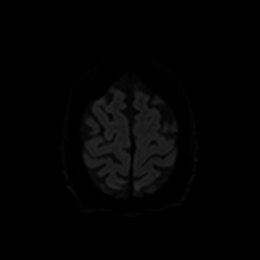
[im 96/96]
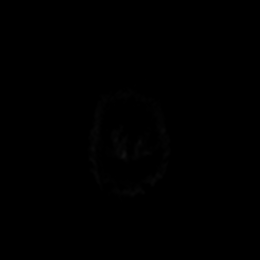

[Series 6: DWI · axial · 3.0mm · 0.88mm/px · z∈[-51,+89]mm · 4 of 48 slices shown (2 of 4)]
[im 1/48]
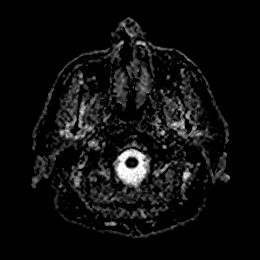
[im 16/48]
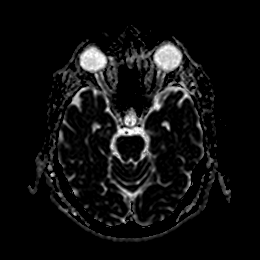
[im 32/48]
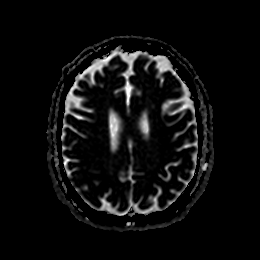
[im 48/48]
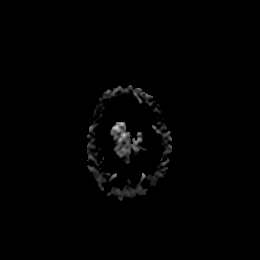

[Series 7: DWI · coronal · 4.0mm · 0.88mm/px · 5 of 70 slices shown (3 of 4)]
[im 1/70]
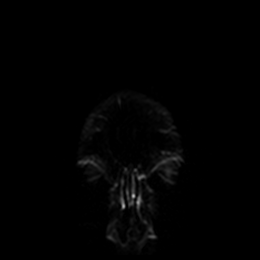
[im 18/70]
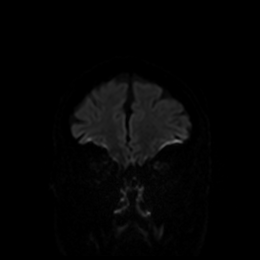
[im 35/70]
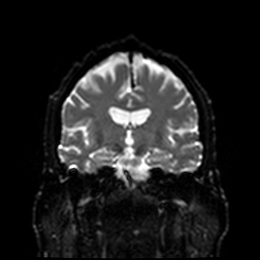
[im 52/70]
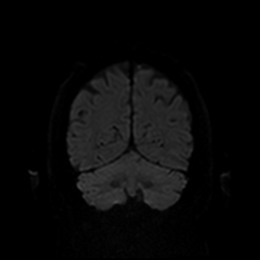
[im 70/70]
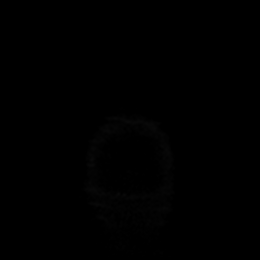

[Series 8: DWI · coronal · 4.0mm · 0.88mm/px · 3 of 35 slices shown (4 of 4)]
[im 1/35]
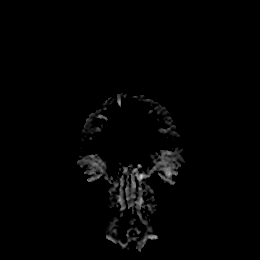
[im 18/35]
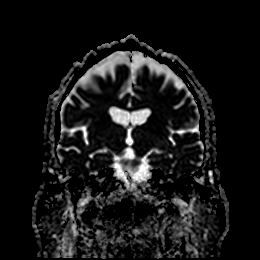
[im 35/35]
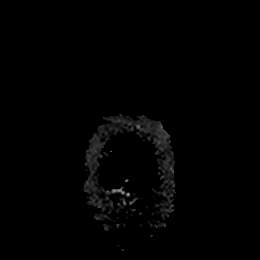

[Series 9: T1 · sagittal · 5.0mm · 0.75mm/px · 2 of 23 slices shown]
[im 1/23]
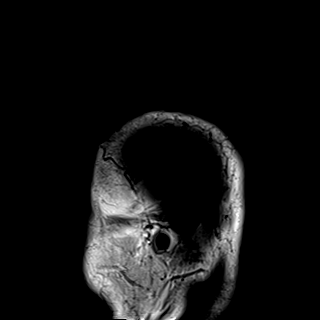
[im 23/23]
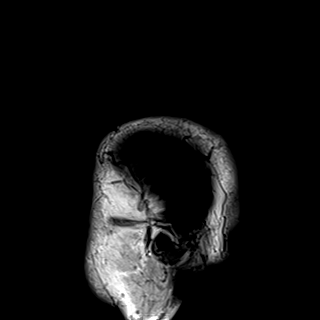

[Series 10: T2 · axial · 5.0mm · 0.72mm/px · z∈[-53,+91]mm · 2 of 25 slices shown (1 of 2)]
[im 1/25]
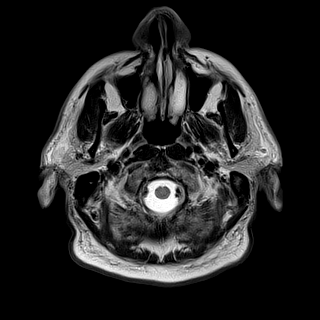
[im 25/25]
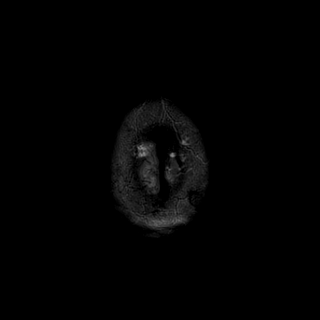

[Series 11: FLAIR · axial · 5.0mm · 0.45mm/px · z∈[-52,+91]mm · 2 of 25 slices shown]
[im 1/25]
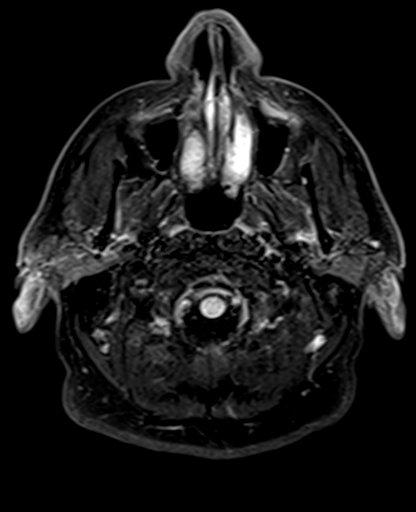
[im 25/25]
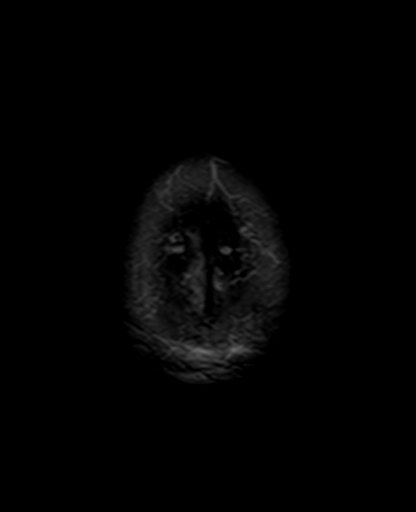

[Series 12: mag_images · axial · 3.0mm · 0.90mm/px · z∈[-65,+111]mm · 4 of 60 slices shown]
[im 1/60]
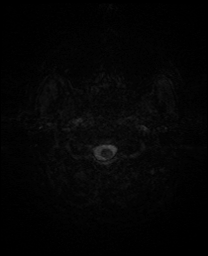
[im 20/60]
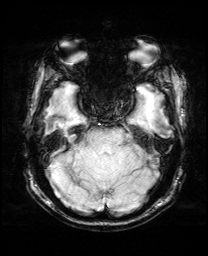
[im 40/60]
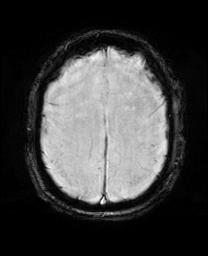
[im 60/60]
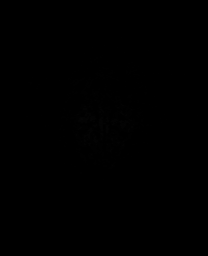

[Series 13: pha_images · axial · 3.0mm · 0.90mm/px · z∈[-65,+111]mm · 4 of 60 slices shown]
[im 1/60]
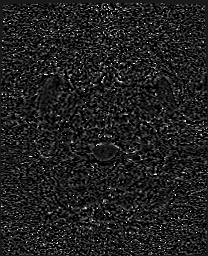
[im 20/60]
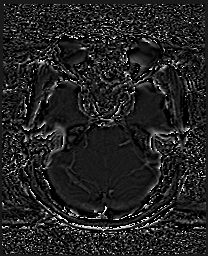
[im 40/60]
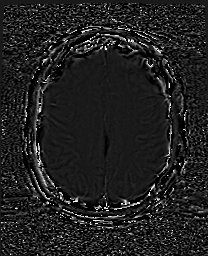
[im 60/60]
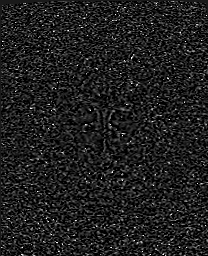

[Series 14: swi_images · axial · 3.0mm · 0.90mm/px · z∈[-65,+111]mm · 4 of 60 slices shown]
[im 1/60]
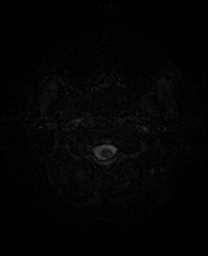
[im 20/60]
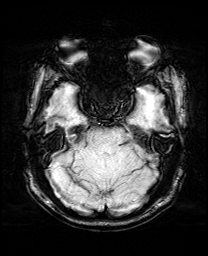
[im 40/60]
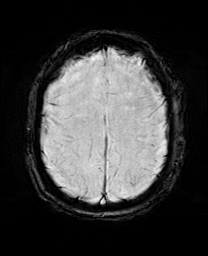
[im 60/60]
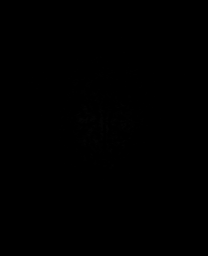

[Series 15: mip_images(sw) · axial · 24.0mm · 0.90mm/px · z∈[-55,+101]mm · 4 of 53 slices shown]
[im 1/53]
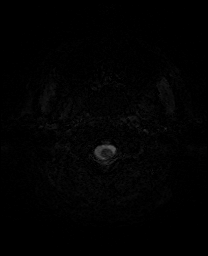
[im 18/53]
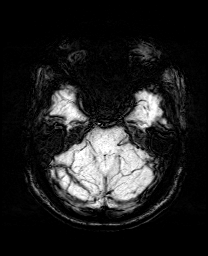
[im 35/53]
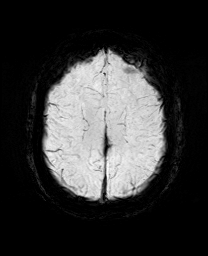
[im 53/53]
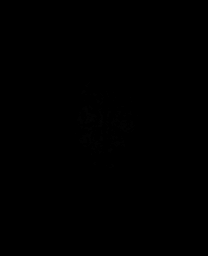

[Series 17: T2 · coronal · 5.0mm · 0.34mm/px · 2 of 29 slices shown (2 of 2)]
[im 1/29]
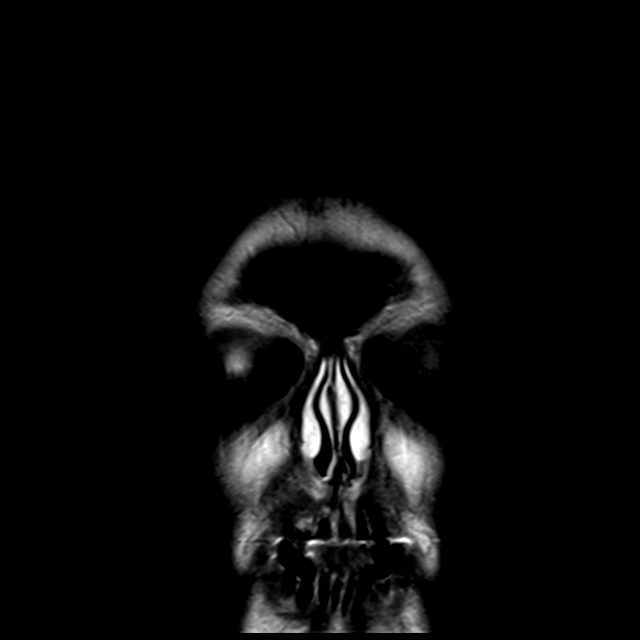
[im 29/29]
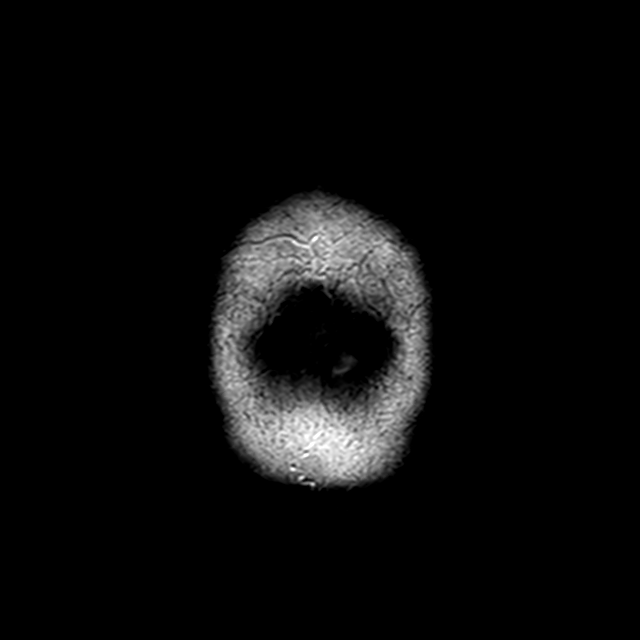

[44 of 48 positions shown; findings below may reference images not displayed]

FINDINGS: Brain: No acute infarction, hemorrhage, hydrocephalus, extra-axial
collection or mass lesion. Mild patchy FLAIR hyperintensity in the
cerebral white matter attributed to chronic microvascular ischemia
given patient age. Stable and unremarkable brain volume.

Vascular: Head normal flow voids, including vertebrobasilar

Skull and upper cervical spine: Normal marrow signal.

Sinuses/Orbits: Negative. Unremarkable appearance of the mastoid and
middle ear spaces.
IMPRESSION: 1. No acute finding including infarct.  No explanation for vertigo.
2. Mild chronic small vessel disease without progression from 3175.

## 2021-11-12 DIAGNOSIS — H903 Sensorineural hearing loss, bilateral: Secondary | ICD-10-CM | POA: Diagnosis not present

## 2021-11-12 DIAGNOSIS — H8102 Meniere's disease, left ear: Secondary | ICD-10-CM | POA: Diagnosis not present

## 2021-11-12 DIAGNOSIS — H7202 Central perforation of tympanic membrane, left ear: Secondary | ICD-10-CM | POA: Diagnosis not present

## 2021-11-12 DIAGNOSIS — H6122 Impacted cerumen, left ear: Secondary | ICD-10-CM | POA: Diagnosis not present

## 2021-11-29 ENCOUNTER — Encounter: Payer: Self-pay | Admitting: Internal Medicine

## 2021-11-29 ENCOUNTER — Ambulatory Visit: Payer: PPO | Admitting: Internal Medicine

## 2021-11-29 VITALS — BP 100/60 | HR 50 | Ht 74.0 in | Wt 210.0 lb

## 2021-11-29 DIAGNOSIS — K219 Gastro-esophageal reflux disease without esophagitis: Secondary | ICD-10-CM | POA: Diagnosis not present

## 2021-11-29 DIAGNOSIS — K649 Unspecified hemorrhoids: Secondary | ICD-10-CM | POA: Diagnosis not present

## 2021-11-29 DIAGNOSIS — Z86010 Personal history of colonic polyps: Secondary | ICD-10-CM

## 2021-11-29 MED ORDER — HYDROCORTISONE (PERIANAL) 2.5 % EX CREA: 1.0000 "application " | TOPICAL_CREAM | Freq: Every day | CUTANEOUS | 1 refills | Status: DC

## 2021-11-29 NOTE — Progress Notes (Signed)
HISTORY OF PRESENT ILLNESS:  Andrew Bates is a 76 y.o. male with past medical history as listed below who presents today regarding symptomatic hemorrhoids.  Patient was last seen in this office June 2018 regarding epigastric discomfort and weight loss.  See that dictation.  I last saw the patient in January 2021 when he underwent surveillance colonoscopy.  He was found to have diminutive adenomas and moderate internal hemorrhoids.  He was also noted to have scarring from prior hemorrhoidal banding in 2018 with Dr. Hilarie Fredrickson.  Patient tells me that in recent months he has had problems with hemorrhoidal swelling and occasional prolapse.  Worse with firm stools.  No bleeding or pain.  He has been using Preparation H.  When he takes fiber regularly, he does better.  GI review of systems is otherwise negative.  He is accompanied today by his wife.  Review of blood work from January 2023 shows unremarkable CBC with hemoglobin 13.7.  REVIEW OF SYSTEMS:  All non-GI ROS negative unless otherwise stated in the HPI except for back pain, hearing problems, sleeping problems, ankle swelling  Past Medical History:  Diagnosis Date   Adenomatous colon polyp    Anxiety    Arthritis    Asthma    Coronary artery disease    Nonobstructive at cardiac catheterization 2020   Depression    DVT (deep venous thrombosis) ([place]) 2008   Essential hypertension    GERD (gastroesophageal reflux disease)    History of kidney stones    Hyperlipidemia    Internal hemorrhoids    Peripheral neuropathy    PTSD (post-traumatic stress disorder)    Sleep apnea    cpap   Vertigo     Past Surgical History:  Procedure Laterality Date   ANTERIOR CERVICAL DECOMP/DISCECTOMY FUSION N/A 07/09/2015   Procedure: ANTERIOR CERVICAL DECOMPRESSION/DISCECTOMY FUSION CERVICAL FIVE-SIX,CERVICAL SIX-SEVEN;  Surgeon: Newman Pies, MD;  Location: [place] [facility];  Service: Neurosurgery;  Laterality: N/A;   CARPAL TUNNEL RELEASE Bilateral 2014    COLONOSCOPY     CYSTOSCOPY/RETROGRADE/URETEROSCOPY/STONE EXTRACTION WITH BASKET Left 02/14/2014   Procedure: CYSTOSCOPY AND LEFT DOUBLE J STENT PLACEMENT;  ATTEMPTED STONE  EXTRACTION ;  Surgeon: Jorja Loa, MD;  [facility];  Service: Urology;  Laterality: Left;   ENDOLYMPHATIC SAC DECOMPRESSION Left 06/14/2019   Procedure: [facility] DECOMPRESSION;  Surgeon: Leta Baptist, MD;  Location: [place];  Service: ENT;  Laterality: Left;   ESOPHAGOGASTRODUODENOSCOPY ENDOSCOPY     INTRAVASCULAR PRESSURE WIRE/FFR STUDY N/A 06/25/2021   Procedure: INTRAVASCULAR PRESSURE WIRE/FFR STUDY;  Surgeon: Leonie Man, MD;  Location: [place] [facility] LAB;  Service: Cardiovascular;  Laterality: N/A;   KNEE SURGERY Right 2006   Arthroscopy - MCl tear   LEFT HEART CATH AND CORONARY ANGIOGRAPHY N/A 08/17/2018   Procedure: LEFT HEART CATH AND CORONARY ANGIOGRAPHY;  Surgeon: Burnell Blanks, MD;  Location: [place] [facility];  Service: Cardiovascular;  Laterality: N/A;   LEFT HEART CATH AND CORONARY ANGIOGRAPHY N/A 06/25/2021   Procedure: LEFT HEART CATH AND CORONARY ANGIOGRAPHY;  Surgeon: Leonie Man, MD;  Location: [place] [facility];  Service: Cardiovascular;  Laterality: N/A;   UPPER GASTROINTESTINAL ENDOSCOPY      Social History JEWELL RYANS  reports that he quit smoking about 29 years ago. His smoking use included cigars. He has never used smokeless tobacco. He reports that he does not drink alcohol and does not use drugs.  family history includes Bladder Cancer in his brother;  Colon cancer (age of onset: 74) in his brother; Colon polyps in his brother; Hyperlipidemia in his father; Hypertension in his father; Lung cancer in his father; Thyroid cancer in his mother.  Allergies  Allergen Reactions   Other Nausea And Vomiting and Other (See Comments)    "STRONG" PAIN MEDICATIONS IN GENERAL: HEADACHE   Tamsulosin Anaphylaxis   Codeine Nausea And Vomiting     REACTION: Headache   Morphine And Related Nausea And Vomiting   Metoprolol Itching   Oxybutynin Swelling   Pentazocine-Naloxone Hcl Nausea And Vomiting   Sertraline Swelling    Swelling in knee 100 mg   Topiramate Other (See Comments)    Headache       PHYSICAL EXAMINATION: Vital signs: BP 100/60   Pulse (!) 50   Ht '6\' 2"'$  (1.88 m)   Wt 210 lb (95.3 kg)   BMI 26.96 kg/m   Constitutional: generally well-appearing, no acute distress Psychiatric: alert and oriented x3, cooperative Eyes: extraocular movements intact, anicteric, conjunctiva pink Mouth: oral pharynx moist, no lesions Neck: supple no lymphadenopathy Cardiovascular: heart regular rate and rhythm, no murmur Lungs: clear to auscultation bilaterally Abdomen: soft, nontender, nondistended, no obvious ascites, no peritoneal signs, normal bowel sounds, no organomegaly Rectal: No significant external abnormalities.  Obvious internal hemorrhoids.  No tenderness. Extremities: no clubbing, cyanosis, or significant lower extremity edema bilaterally Skin: no lesions on visible extremities Neuro: No focal deficits.  Cranial nerves intact  ASSESSMENT:  1.  Symptomatic internal hemorrhoids.  Prior banding 2018.  This helped.  He feels that he requires repeat banding.  I agree. 2.  History of multiple adenomatous colon polyps and a family history of colon cancer in his brother.  Last colonoscopy January 2021.  Due for follow-up around January 2026. 3.  GERD.  On omeprazole 20 mg daily.  This controls symptoms. 4.  General medical problems.  Stable   PLAN:  1.  Daily fiber supplementation in the form of Metamucil or Citrucel (or generic equivalent).  2 tablespoons daily in 14 ounces of water or juice. 2.  Anusol HC suppositories (or compounded equivalent); #30; 1 PR nightly as needed.  Refills. 3.  Schedule outpatient hemorrhoidal banding with Dr. Hilarie Fredrickson. 4.  Reflux precautions 5.  Continue omeprazole 6.  Surveillance  colonoscopy around January 2026 A total time of 30 minutes was spent preparing to see the patient, reviewing outside data, obtaining comprehensive history, performing medically appropriate physical examination, counseling the patient and his wife regarding his above listed issues, ordering medications, ordering hemorrhoidal banding procedure, and documenting clinical information in the health record

## 2021-11-29 NOTE — Patient Instructions (Signed)
If you are age 75 or older, your body mass index should be between 23-30. Your Body mass index is 26.96 kg/m. If this is out of the aforementioned range listed, please consider follow up with your Primary Care Provider.  If you are age 58 or younger, your body mass index should be between 19-25. Your Body mass index is 26.96 kg/m. If this is out of the aformentioned range listed, please consider follow up with your Primary Care Provider.   ________________________________________________________  The [place] [facility] providers would like to encourage you to use [facility] to communicate with providers for non-urgent requests or questions.  Due to long hold times on the telephone, sending your provider a message by [facility] may be a faster and more efficient way to get a response.  Please allow 48 business hours for a response.  Please remember that this is for non-urgent requests.  _______________________________________________________  Increase your fiber to 2 tablespoons daily  We have sent the following medications to your pharmacy for you to pick up at your convenience:  Anusol HC cream.  Purchase Preparation H suppositories over the counter.  Apply a pea size amount of the cream to the suppository and insert rectally nightly.

## 2021-12-02 ENCOUNTER — Ambulatory Visit (HOSPITAL_COMMUNITY): Payer: No Typology Code available for payment source | Attending: Family Medicine

## 2021-12-02 ENCOUNTER — Encounter (HOSPITAL_COMMUNITY): Payer: Self-pay

## 2021-12-02 DIAGNOSIS — M5459 Other low back pain: Secondary | ICD-10-CM | POA: Insufficient documentation

## 2021-12-02 DIAGNOSIS — R2689 Other abnormalities of gait and mobility: Secondary | ICD-10-CM | POA: Insufficient documentation

## 2021-12-02 DIAGNOSIS — R296 Repeated falls: Secondary | ICD-10-CM | POA: Diagnosis present

## 2021-12-02 NOTE — Therapy (Signed)
OUTPATIENT PHYSICAL THERAPY THORACOLUMBAR EVALUATION   Patient Name: Andrew Bates MRN: [id number] DOB:May 11, 1946, 76 y.o., male Today's Date: 12/02/2021   PT End of Session - 12/02/21 0948     Visit Number 1    Number of Visits 15    Date for PT Re-Evaluation 01/20/22    [facility]ization Type [facility] (approved 15 visits)    Progress Note Due on Visit 15    PT Start Time 0950    PT Stop Time 1030    PT Time Calculation (min) 40 min             Past Medical History:  Diagnosis Date   Adenomatous colon polyp    Anxiety    Arthritis    Asthma    Coronary artery disease    Nonobstructive at cardiac catheterization 2020   Depression    DVT (deep venous thrombosis) ([place]) 2008   Essential hypertension    GERD (gastroesophageal reflux disease)    History of kidney stones    Hyperlipidemia    Internal hemorrhoids    Peripheral neuropathy    PTSD (post-traumatic stress disorder)    Sleep apnea    cpap   Vertigo    Past Surgical History:  Procedure Laterality Date   ANTERIOR CERVICAL DECOMP/DISCECTOMY FUSION N/A 07/09/2015   Procedure: ANTERIOR CERVICAL DECOMPRESSION/DISCECTOMY FUSION CERVICAL FIVE-SIX,CERVICAL SIX-SEVEN;  Surgeon: Newman Pies, MD;  Location: [place] [facility];  Service: Neurosurgery;  Laterality: N/A;   CARPAL TUNNEL RELEASE Bilateral 2014   COLONOSCOPY     CYSTOSCOPY/RETROGRADE/URETEROSCOPY/STONE EXTRACTION WITH BASKET Left 02/14/2014   Procedure: CYSTOSCOPY AND LEFT DOUBLE J STENT PLACEMENT;  ATTEMPTED STONE  EXTRACTION ;  Surgeon: Jorja Loa, MD;  Location: [facility];  Service: Urology;  Laterality: Left;   ENDOLYMPHATIC SAC DECOMPRESSION Left 06/14/2019   Procedure: [facility] DECOMPRESSION;  Surgeon: Leta Baptist, MD;  Location: [place];  Service: ENT;  Laterality: Left;   ESOPHAGOGASTRODUODENOSCOPY ENDOSCOPY     INTRAVASCULAR PRESSURE WIRE/FFR STUDY N/A 06/25/2021   Procedure: INTRAVASCULAR PRESSURE WIRE/FFR STUDY;   Surgeon: Leonie Man, MD;  Location: [place] [facility];  Service: Cardiovascular;  Laterality: N/A;   KNEE SURGERY Right 2006   Arthroscopy - MCl tear   LEFT HEART CATH AND CORONARY ANGIOGRAPHY N/A 08/17/2018   Procedure: LEFT HEART CATH AND CORONARY ANGIOGRAPHY;  Surgeon: Burnell Blanks, MD;  Location: [place] [facility];  Service: Cardiovascular;  Laterality: N/A;   LEFT HEART CATH AND CORONARY ANGIOGRAPHY N/A 06/25/2021   Procedure: LEFT HEART CATH AND CORONARY ANGIOGRAPHY;  Surgeon: Leonie Man, MD;  Location: [place] [facility];  Service: Cardiovascular;  Laterality: N/A;   UPPER GASTROINTESTINAL ENDOSCOPY     Patient Active Problem List   Diagnosis Date Noted   Stage 3b chronic kidney disease (CKD) ([place]) 06/24/2021   PTSD (post-traumatic stress disorder)    Coronary artery disease, non-occlusive-by cardiac catheterization in 2020 01/10/2019   Unstable angina ([place]) 08/17/2018   Chest pain    Cervical spondylosis with radiculopathy 07/09/2015   Urinary frequency 02/25/2015   Dehydration 02/25/2015   Vertigo 02/25/2015   Hydronephrosis 02/15/2014   AKI (acute kidney injury) ([place]) 02/15/2014   Calculus of ureter 02/14/2014   Urinary hesitancy 11/17/2013   Overweight(278.02) 11/17/2013   Kidney stones 11/16/2013   Acute onset of severe vertigo 11/16/2013   Sepsis secondary to UTI ([place]) 11/16/2013   Essential hypertension 11/16/2013   Hyperlipidemia with target LDL less than 70 11/16/2013  GERD (gastroesophageal reflux disease) 11/16/2013    PCP: Sharilyn Sites MD  REFERRING PROVIDER: Newman Pies MD  REFERRING DIAG: [id number] Chronic Bilateral LBP with left-sided sciatica   Rationale for Evaluation and Treatment Rehabilitation  THERAPY DIAG:  Other low back pain  Other abnormalities of gait and mobility  Repeated falls  ONSET DATE:   SUBJECTIVE:                                                                                                                                                                                            SUBJECTIVE STATEMENT: Pt reports that he has stenosis, scoliosis, herniated disc of lumbar spine. Pt reports that these back problems started in 2003 but were just on exertion at that time. Patient reports that low back is always hurting now. Pt reports that in past year with exercising he is having increased hip pain as well as unable to hold self upright and starts flexing trunk. Patient reports that he starts getting these pains after just a minute or 2 of walking. Patient reports that he has never had PT for back.  PERTINENT HISTORY:  Patient reports primarily left side back pain with left side radiating pain. Pt reports pain spans across back after walking/standing.   Imaging report: Severe lumbar spondylosis multilevel severe facet arthrosis Multi level severe disc height loss Sacrum and SI joints normal  Chronic Bilateral LBP with left-sided sciatica  unstable angina 06/25/2021  Neck surgery 6 years ago Right knee, meniscus surgery 2006 Left rotator cuff repair 2021 Hard of hearing  Heart catheterization jan 2023  PAIN:  Are you having pain? Yes: NPRS scale: at worst 8/10, current 4/10 Pain location: left psis  Pain description: sore Aggravating factors: walking  Relieving factors: massage    PRECAUTIONS: None  WEIGHT BEARING RESTRICTIONS No  FALLS:  Has patient fallen in last 6 months? Yes. Number of falls 10 patients foot caught both levels of patio and fell on right elbow. Pt correlates falls to uneven grass/log/root areas in backyard. Patient feels like he isnt picking up his feet high enough.   LIVING ENVIRONMENT: Lives with: lives with their spouse Lives in: House/apartment Stairs: Yes: External: 4-10 depending on location  steps; on right going up Has following equipment at home: None  OCCUPATION: retired   PLOF: Independent with basic ADLs and wall/furniture cruising for past 6-8  months. 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    PATIENT GOALS improve back strength, decrease pain, being able to stay upright.    OBJECTIVE:   DIAGNOSTIC FINDINGS:  Imaging report: Severe lumbar spondylosis multilevel severe facet arthrosis Multi level severe disc height loss Sacrum and SI joints normal  PATIENT SURVEYS:  FOTO 42  SCREENING FOR RED FLAGS: Bowel or bladder incontinence: No Spinal tumors: No Cauda equina syndrome: No Compression fracture: No Abdominal aneurysm: No  COGNITION:  Overall cognitive status: Within functional limits for tasks assessed     SENSATION: WFL   POSTURE: rounded shoulders, decreased lumbar lordosis, and decreased thoracic kyphosis  PALPATION: TTP around left QL, left psis  Decrease in symptoms of radiation with prone extensions but reports increasing pain centrally in low back.   LUMBAR ROM:   Active  A/PROM  eval  Flexion 6 cm   Extension 2cm  Right lateral flexion 8 degrees (causes pain on left side)  Left lateral flexion 14 degrees  Right rotation   Left rotation    (Blank rows = not tested)  Flexion and extension measured using bisecting lines from PSIS, initial point 15 cm above.    LOWER EXTREMITY MMT:    MMT Right eval Left eval  Hip flexion 5 5  Hip extension 4 single leg bridge 4 single leg bridge  Hip abduction 4 4  Hip adduction 4 4  Hip internal rotation    Hip external rotation    Knee flexion 5 5  Knee extension 5 5  Ankle dorsiflexion 5 5  Ankle plantarflexion    Ankle inversion 5 5  Ankle eversion 5 5   (Blank rows = not tested)   FUNCTIONAL TESTS:  5 times sit to stand: 37.07 seconds 2 minute walk test: 280'  GAIT: Distance walked:280' Assistive device utilized: None Level of assistance: Complete Independence Comments: decreased cadence, inc knee flexion in stance,decreased trunk stability laterally     TODAY'S TREATMENT   evaluation   PATIENT EDUCATION:  Education details: Patient educated on exam findings, POC, scope of PT Person educated: Patient Education method: Consulting civil engineer, Demonstration Education comprehension: verbalized understanding   HOME EXERCISE PROGRAM: Initiate next session  ASSESSMENT:  CLINICAL IMPRESSION: Patient a 76 y.o. male who was seen today for physical therapy evaluation and treatment for Chronic Bilateral LBP with left-sided sciatica .  Patient presents with reports of consistent pain in left QL and psis area. Reports that with increased time on feet feels pain spanning across entire low back. Patient presents with decreased strength in proximal hips. Decrease in symptoms of radiation with prone extensions but reports increasing pain centrally in low back. Pt with lumbar restriction especially into extension. Patient limited in ability to perform daily exercise, home chores as well as bed mobility and positioning. Patient will benefit from skilled PT services to improve strength, rom, decrease pain and improve ability to perform functional mobility.    OBJECTIVE IMPAIRMENTS Abnormal gait, decreased activity tolerance, decreased endurance, decreased mobility, decreased ROM, decreased strength, increased fascial restrictions, impaired flexibility, improper body mechanics, postural dysfunction, and pain.   ACTIVITY LIMITATIONS carrying, bending, sitting, standing, and squatting  PARTICIPATION LIMITATIONS: cleaning, laundry, shopping, community activity, and yard work  PERSONAL FACTORS 3+ comorbidities:     Angina, Anxiety or Panic Disorders, Arthritis, Back pain, Congestive Heart Failure or Heart Disease, Depression, Gastrointestinal Disease, Hearing Impairment, High Blood Pressure, Osteoporosis, Prior Surgery, Sleep dysfunction, Stroke or TIA are also affecting patient's functional outcome.   REHAB POTENTIAL: Good  CLINICAL DECISION MAKING: Stable/uncomplicated  EVALUATION  COMPLEXITY: Moderate   GOALS: Goals reviewed with patient? Yes  SHORT TERM GOALS: Target date: 12/23/2021  Patient will be independent with  HEP in order to improve functional outcomes. Baseline:  Goal status: INITIAL  2.  Patient will report at least 25% improvement in symptoms for improved quality of life. Baseline:  Goal status: INITIAL  3.  Patient will improve five time sit to stand by 10 seconds to improve efficiency in transfers.  Baseline: 5 times sit to stand: 37.07 seconds  Goal status: INITIAL  LONG TERM GOALS: Target date: 01/20/2022  Patient will report at least 75% improvement in symptoms for improved quality of life. Baseline:  Goal status: INITIAL  2.  Patient will improve FOTO score by at least 5 points in order to indicate improved tolerance to activity. Baseline: 42 Goal status: INITIAL  3.  Patient will report average back pain of no greater than 5/10 when completing household chores to improve quality of life.  Baseline: 8/10 Goal status: INITIAL  4.  Patient will be able to ambulate at least 100 feet in 2MWT in order to demonstrate improved tolerance to activity. Baseline: 2 minute walk test: 280' Goal status: INITIAL     PLAN: PT FREQUENCY: 2x/week  PT DURATION: other: 7 weeks  PLANNED INTERVENTIONS: PLANNED INTERVENTIONS: Therapeutic exercises, Therapeutic activity, Neuromuscular re-education, Balance training, Gait training, Patient/Family education, Joint manipulation, Joint mobilization, Stair training, Orthotic/Fit training, DME instructions, Aquatic Therapy, Dry Needling, Electrical stimulation, Spinal manipulation, Spinal mobilization, Cryotherapy, Moist heat, Compression bandaging, scar mobilization, Splintting, Taping, Traction, Ultrasound, Ionotophoresis '4mg'$ /ml Dexamethasone, and Manual therapy  PLAN FOR NEXT SESSION: initiate HEP for LE/trunk stretching/mobility and core strength    Zadia Uhde, PT 12/02/2021, 4:04 PM

## 2021-12-10 ENCOUNTER — Encounter (HOSPITAL_COMMUNITY): Payer: Self-pay

## 2021-12-10 ENCOUNTER — Ambulatory Visit (HOSPITAL_COMMUNITY): Payer: No Typology Code available for payment source | Attending: Family Medicine

## 2021-12-10 DIAGNOSIS — R296 Repeated falls: Secondary | ICD-10-CM | POA: Diagnosis not present

## 2021-12-10 DIAGNOSIS — R2689 Other abnormalities of gait and mobility: Secondary | ICD-10-CM | POA: Diagnosis not present

## 2021-12-10 DIAGNOSIS — M5442 Lumbago with sciatica, left side: Secondary | ICD-10-CM | POA: Insufficient documentation

## 2021-12-10 DIAGNOSIS — M5459 Other low back pain: Secondary | ICD-10-CM

## 2021-12-10 NOTE — Therapy (Signed)
OUTPATIENT PHYSICAL THERAPY THORACOLUMBAR TREATMENT   Patient Name: Andrew Bates MRN: [id number] DOB:14-Feb-1946, 76 y.o., male Today's Date: 12/10/2021   PT End of Session - 12/10/21 1021     Visit Number 2    Number of Visits 15    Date for PT Re-Evaluation 01/20/22    Authorization Type [facility] (approved 15 visits)    Progress Note Due on Visit 15    PT Start Time 1005    PT Stop Time 1050    PT Time Calculation (min) 45 min    Activity Tolerance Patient tolerated treatment well;Patient limited by pain              Past Medical History:  Diagnosis Date   Adenomatous colon polyp    Anxiety    Arthritis    Asthma    Coronary artery disease    Nonobstructive at cardiac catheterization 2020   Depression    DVT (deep venous thrombosis) ([place]) 2008   Essential hypertension    GERD (gastroesophageal reflux disease)    History of kidney stones    Hyperlipidemia    Internal hemorrhoids    Peripheral neuropathy    PTSD (post-traumatic stress disorder)    Sleep apnea    cpap   Vertigo    Past Surgical History:  Procedure Laterality Date   ANTERIOR CERVICAL DECOMP/DISCECTOMY FUSION N/A 07/09/2015   Procedure: ANTERIOR CERVICAL DECOMPRESSION/DISCECTOMY FUSION CERVICAL FIVE-SIX,CERVICAL SIX-SEVEN;  Surgeon: Newman Pies, MD;  Location: [place] [facility];  Service: Neurosurgery;  Laterality: N/A;   CARPAL TUNNEL RELEASE Bilateral 2014   COLONOSCOPY     CYSTOSCOPY/RETROGRADE/URETEROSCOPY/STONE EXTRACTION WITH BASKET Left 02/14/2014   Procedure: CYSTOSCOPY AND LEFT DOUBLE J STENT PLACEMENT;  ATTEMPTED STONE  EXTRACTION ;  Surgeon: Jorja Loa, MD;  Location: [facility];  Service: Urology;  Laterality: Left;   ENDOLYMPHATIC SAC DECOMPRESSION Left 06/14/2019   Procedure: [facility] DECOMPRESSION;  Surgeon: Leta Baptist, MD;  Location: [place];  Service: ENT;  Laterality: Left;   ESOPHAGOGASTRODUODENOSCOPY ENDOSCOPY     INTRAVASCULAR PRESSURE  WIRE/FFR STUDY N/A 06/25/2021   Procedure: INTRAVASCULAR PRESSURE WIRE/FFR STUDY;  Surgeon: Leonie Man, MD;  Location: [place] [facility];  Service: Cardiovascular;  Laterality: N/A;   KNEE SURGERY Right 2006   Arthroscopy - MCl tear   LEFT HEART CATH AND CORONARY ANGIOGRAPHY N/A 08/17/2018   Procedure: LEFT HEART CATH AND CORONARY ANGIOGRAPHY;  Surgeon: Burnell Blanks, MD;  Location: [place] [facility];  Service: Cardiovascular;  Laterality: N/A;   LEFT HEART CATH AND CORONARY ANGIOGRAPHY N/A 06/25/2021   Procedure: LEFT HEART CATH AND CORONARY ANGIOGRAPHY;  Surgeon: Leonie Man, MD;  Location: [place] [facility];  Service: Cardiovascular;  Laterality: N/A;   UPPER GASTROINTESTINAL ENDOSCOPY     Patient Active Problem List   Diagnosis Date Noted   Stage 3b chronic kidney disease (CKD) ([place]) 06/24/2021   PTSD (post-traumatic stress disorder)    Coronary artery disease, non-occlusive-by cardiac catheterization in 2020 01/10/2019   Unstable angina ([place]) 08/17/2018   Chest pain    Cervical spondylosis with radiculopathy 07/09/2015   Urinary frequency 02/25/2015   Dehydration 02/25/2015   Vertigo 02/25/2015   Hydronephrosis 02/15/2014   AKI (acute kidney injury) ([place]) 02/15/2014   Calculus of ureter 02/14/2014   Urinary hesitancy 11/17/2013   Overweight(278.02) 11/17/2013   Kidney stones 11/16/2013   Acute onset of severe vertigo 11/16/2013   Sepsis secondary to UTI ([place]) 11/16/2013   Essential  hypertension 11/16/2013   Hyperlipidemia with target LDL less than 70 11/16/2013   GERD (gastroesophageal reflux disease) 11/16/2013    PCP: Sharilyn Sites MD  REFERRING PROVIDER: Newman Pies MD Next apt: Sept 13thx 2023  REFERRING DIAG: M54.42 Chronic Bilateral LBP with left-sided sciatica   Rationale for Evaluation and Treatment Rehabilitation  THERAPY DIAG:  Other low back pain  Other abnormalities of gait and mobility  Repeated falls  ONSET DATE:    SUBJECTIVE:                                                                                                                                                                                           SUBJECTIVE STATEMENT:  Pt reports he has dull pain 2/10 in center more Left of lower back and radicular symptoms down Lt LE past knee.  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    Eval subjective: Pt reports that he has stenosis, scoliosis, herniated disc of lumbar spine. Pt reports that these back problems started in 2003 but were just on exertion at that time. Patient reports that low back is always hurting now. Pt reports that in past year with exercising he is having increased hip pain as well as unable to hold self upright and starts flexing trunk. Patient reports that he starts getting these pains after just a minute or 2 of walking. Patient reports that he has never had PT for back.  PERTINENT HISTORY:  Patient reports primarily left side back pain with left side radiating pain. Pt reports pain spans across back after walking/standing.   Imaging report: Severe lumbar spondylosis multilevel severe facet arthrosis Multi level severe disc height loss Sacrum and SI joints normal  Chronic Bilateral LBP with left-sided sciatica  unstable angina 06/25/2021  Neck surgery 6 years ago Right knee, meniscus surgery 2006 Left rotator cuff repair 2021 Hard of hearing  Heart catheterization jan 2023  PAIN:  Are you having pain? Yes: NPRS scale: 2//10 Pain location: left psis  Pain description: sore, achey Aggravating factors: walking  Relieving factors: massage  Radicular: Lt LE posterior to knee   PRECAUTIONS: None  WEIGHT BEARING RESTRICTIONS No  FALLS:  Has patient fallen in last 6 months? Yes. Number of falls 10 patients foot caught both levels of patio and  fell on right elbow. Pt correlates falls to uneven grass/log/root areas in backyard. Patient feels like he isnt picking up his feet high enough.   LIVING ENVIRONMENT: Lives with: lives with their spouse Lives  in: House/apartment Stairs: Yes: External: 4-10 depending on location  steps; on right going up Has following equipment at home: None  OCCUPATION: retired   PLOF: Independent with basic ADLs and wall/furniture cruising for past 6-8 months. 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    PATIENT GOALS improve back strength, decrease pain, being able to stay upright.    OBJECTIVE:   DIAGNOSTIC FINDINGS:  Imaging report: Severe lumbar spondylosis multilevel severe facet arthrosis Multi level severe disc height loss Sacrum and SI joints normal  PATIENT SURVEYS:  FOTO 42  SCREENING FOR RED FLAGS: Bowel or bladder incontinence: No Spinal tumors: No Cauda equina syndrome: No Compression fracture: No Abdominal aneurysm: No  COGNITION:  Overall cognitive status: Within functional limits for tasks assessed     SENSATION: WFL   POSTURE: rounded shoulders, decreased lumbar lordosis, and decreased thoracic kyphosis  PALPATION: TTP around left QL, left psis  Decrease in symptoms of radiation with prone extensions but reports increasing pain centrally in low back.   LUMBAR ROM:   Active  A/PROM  eval  Flexion 6 cm   Extension 2cm  Right lateral flexion 8 degrees (causes pain on left side)  Left lateral flexion 14 degrees  Right rotation   Left rotation    (Blank rows = not tested)  Flexion and extension measured using bisecting lines from PSIS, initial point 15 cm above.    LOWER EXTREMITY MMT:    MMT Right eval Left eval  Hip flexion 5 5  Hip extension 4 single leg bridge 4 single leg bridge  Hip abduction 4 4  Hip adduction 4 4  Hip internal rotation    Hip external rotation    Knee flexion 5 5  Knee extension  5 5  Ankle dorsiflexion 5 5  Ankle plantarflexion    Ankle inversion 5 5  Ankle eversion 5 5   (Blank rows = not tested)   FUNCTIONAL TESTS:  5 times sit to stand: 37.07 seconds 2 minute walk test: 280'  GAIT: Distance walked:280' Assistive device utilized: None Level of assistance: Complete Independence Comments: decreased cadence, inc knee flexion in stance,decreased trunk stability laterally     TODAY'S TREATMENT  12/10/21:  reviewed goals, educated importance of HEP compliance for maximal benefits  Educated importance of posture for pain control Standing: 3D hip excursion (lateral weight shift, rotate, controlled STS, extend) 10x Prone: POE x 2 min with 2 pillows under hips, spreads out back pain from central to sides, resolved radicular symptoms Supine: Diaphragmatic breathing 70mn  Isometric abdominal sets paired with exhalation 132m  Bent knee raise 10x 5" with ab set  Bridge 10x- c/o aggravation to lower back upon lowering, relief in the air  evaluation   PATIENT EDUCATION:  Education details: 12/10/21: Reviewed goals, educated importance of HEP compliance for maximal benefits and importance of posture.  Eval: Patient educated on exam findings, POC, scope of PT Person educated: Patient Education method: Explanation, Demonstration Education comprehension: verbalized understanding   HOME EXERCISE PROGRAM: 12/10/21: Prone on elbow with 2 pillow under hips Diaphragmatic breathing 62m22mIsometric abdominal sets paired with exhalation [id number]: 10x   ASSESSMENT:  CLINICAL IMPRESSION: Reviewed goals, educated importance of HEP compliance for maximal benefits and importance of posture.  Session focus on lumbar mobility and core/proximal strengthening.  Pt reports decreased radicular symptoms in prone position though increased LBP, improved with 2 pillows under hips.  Pt educated  on importance of core stability to assist with LBP.  Pt given initial HEP to improve  lumbar extension and core strengthening, able to demonstrate appropriate mechanics with handout given and verbalized understanding.  OBJECTIVE IMPAIRMENTS Abnormal gait, decreased activity tolerance, decreased endurance, decreased mobility, decreased ROM, decreased strength, increased fascial restrictions, impaired flexibility, improper body mechanics, postural dysfunction, and pain.   ACTIVITY LIMITATIONS carrying, bending, sitting, standing, and squatting  PARTICIPATION LIMITATIONS: cleaning, laundry, shopping, community activity, and yard work  PERSONAL FACTORS 3+ comorbidities:     Angina, Anxiety or Panic Disorders, Arthritis, Back pain, Congestive Heart Failure or Heart Disease, Depression, Gastrointestinal Disease, Hearing Impairment, High Blood Pressure, Osteoporosis, Prior Surgery, Sleep dysfunction, Stroke or TIA are also affecting patient's functional outcome.   REHAB POTENTIAL: Good  CLINICAL DECISION MAKING: Stable/uncomplicated  EVALUATION COMPLEXITY: Moderate   GOALS: Goals reviewed with patient? Yes  SHORT TERM GOALS: Target date: 12/23/2021  Patient will be independent with HEP in order to improve functional outcomes. Baseline:  Goal status: IN PROGRESS  2.  Patient will report at least 25% improvement in symptoms for improved quality of life. Baseline:  Goal status: IN PROGRESS  3.  Patient will improve five time sit to stand by 10 seconds to improve efficiency in transfers.  Baseline: 5 times sit to stand: 37.07 seconds  Goal status: IN PROGRESS  LONG TERM GOALS: Target date: 01/20/2022  Patient will report at least 75% improvement in symptoms for improved quality of life. Baseline:  Goal status: IN PROGRESS  2.  Patient will improve FOTO score by at least 5 points in order to indicate improved tolerance to activity. Baseline: 42 Goal status: IN PROGRESS  3.  Patient will report average back pain of no greater than 5/10 when completing household chores  to improve quality of life.  Baseline: 8/10 Goal status: IN PROGRESS  4.  Patient will be able to ambulate at least 100 feet in 2MWT in order to demonstrate improved tolerance to activity. Baseline: 2 minute walk test: 280' Goal status: IN PROGRESS     PLAN: PT FREQUENCY: 2x/week  PT DURATION: other: 7 weeks  PLANNED INTERVENTIONS: PLANNED INTERVENTIONS: Therapeutic exercises, Therapeutic activity, Neuromuscular re-education, Balance training, Gait training, Patient/Family education, Joint manipulation, Joint mobilization, Stair training, Orthotic/Fit training, DME instructions, Aquatic Therapy, Dry Needling, Electrical stimulation, Spinal manipulation, Spinal mobilization, Cryotherapy, Moist heat, Compression bandaging, scar mobilization, Splintting, Taping, Traction, Ultrasound, Ionotophoresis '4mg'$ /ml Dexamethasone, and Manual therapy   PLAN FOR NEXT SESSION: F/U with HEP for LE/trunk stretching/mobility and core strength   Ihor Austin, LPTA/CLT; CBIS [phone]  Aldona Lento, PTA 12/10/2021, 6:42 PM

## 2021-12-11 ENCOUNTER — Ambulatory Visit (HOSPITAL_COMMUNITY): Payer: No Typology Code available for payment source | Attending: Family Medicine | Admitting: Physical Therapy

## 2021-12-11 ENCOUNTER — Encounter (HOSPITAL_COMMUNITY): Payer: Self-pay | Admitting: Physical Therapy

## 2021-12-11 DIAGNOSIS — R2689 Other abnormalities of gait and mobility: Secondary | ICD-10-CM | POA: Insufficient documentation

## 2021-12-11 DIAGNOSIS — M5459 Other low back pain: Secondary | ICD-10-CM | POA: Insufficient documentation

## 2021-12-11 DIAGNOSIS — R296 Repeated falls: Secondary | ICD-10-CM | POA: Diagnosis not present

## 2021-12-11 NOTE — Patient Instructions (Signed)

## 2021-12-11 NOTE — Therapy (Signed)
OUTPATIENT PHYSICAL THERAPY THORACOLUMBAR TREATMENT   Patient Name: Andrew Bates MRN: [id number] DOB:[birth year], 76 y.o., male Today's Date: 12/11/2021   PT End of Session - 12/11/21 1124     Visit Number 3    Number of Visits 15    Date for PT Re-Evaluation 01/20/22    [facility] Type [facility] (approved 15 visits)    Progress Note Due on Visit 15    PT Start Time 1130    PT Stop Time 1222    PT Time Calculation (min) 52 min    Activity Tolerance Patient tolerated treatment well;Patient limited by pain              Past Medical History:  Diagnosis Date   Adenomatous colon polyp    Anxiety    Arthritis    Asthma    Coronary artery disease    Nonobstructive at cardiac catheterization 2020   Depression    DVT (deep venous thrombosis) ([place]) 2008   Essential hypertension    GERD (gastroesophageal reflux disease)    History of kidney stones    Hyperlipidemia    Internal hemorrhoids    Peripheral neuropathy    PTSD (post-traumatic stress disorder)    Sleep apnea    cpap   Vertigo    Past Surgical History:  Procedure Laterality Date   ANTERIOR CERVICAL DECOMP/DISCECTOMY FUSION N/A 07/09/2015   Procedure: ANTERIOR CERVICAL DECOMPRESSION/DISCECTOMY FUSION CERVICAL FIVE-SIX,CERVICAL SIX-SEVEN;  Surgeon: Newman Pies, MD;  Location: [place];  Service: Neurosurgery;  Laterality: N/A;   CARPAL TUNNEL RELEASE Bilateral 2014   COLONOSCOPY     CYSTOSCOPY/RETROGRADE/URETEROSCOPY/STONE EXTRACTION WITH BASKET Left 02/14/2014   Procedure: CYSTOSCOPY AND LEFT DOUBLE J STENT PLACEMENT;  ATTEMPTED STONE  EXTRACTION ;  Surgeon: Jorja Loa, MD;  Location: [facility];  Service: Urology;  Laterality: Left;   ENDOLYMPHATIC SAC DECOMPRESSION Left 06/14/2019   Procedure: [facility] DECOMPRESSION;  Surgeon: Leta Baptist, MD;  Location: [place];  Service: ENT;  Laterality: Left;   ESOPHAGOGASTRODUODENOSCOPY ENDOSCOPY     INTRAVASCULAR PRESSURE  WIRE/FFR STUDY N/A 06/25/2021   Procedure: INTRAVASCULAR PRESSURE WIRE/FFR STUDY;  Surgeon: Leonie Man, MD;  Location: [place] [facility];  Service: Cardiovascular;  Laterality: N/A;   KNEE SURGERY Right 2006   Arthroscopy - MCl tear   LEFT HEART CATH AND CORONARY ANGIOGRAPHY N/A 08/17/2018   Procedure: LEFT HEART CATH AND CORONARY ANGIOGRAPHY;  Surgeon: Burnell Blanks, MD;  Location: [place] [facility];  Service: Cardiovascular;  Laterality: N/A;   LEFT HEART CATH AND CORONARY ANGIOGRAPHY N/A 06/25/2021   Procedure: LEFT HEART CATH AND CORONARY ANGIOGRAPHY;  Surgeon: Leonie Man, MD;  Location: [place] [facility];  Service: Cardiovascular;  Laterality: N/A;   UPPER GASTROINTESTINAL ENDOSCOPY     Patient Active Problem List   Diagnosis Date Noted   Stage 3b chronic kidney disease (CKD) ([place]) 06/24/2021   PTSD (post-traumatic stress disorder)    Coronary artery disease, non-occlusive-by cardiac catheterization in 2020 01/10/2019   Unstable angina ([place]) 08/17/2018   Chest pain    Cervical spondylosis with radiculopathy 07/09/2015   Urinary frequency 02/25/2015   Dehydration 02/25/2015   Vertigo 02/25/2015   Hydronephrosis 02/15/2014   AKI (acute kidney injury) ([place]) 02/15/2014   Calculus of ureter 02/14/2014   Urinary hesitancy 11/17/2013   Overweight(278.02) 11/17/2013   Kidney stones 11/16/2013   Acute onset of severe vertigo 11/16/2013   Sepsis secondary to UTI ([place]) 11/16/2013   Essential  hypertension 11/16/2013   Hyperlipidemia with target LDL less than 70 11/16/2013   GERD (gastroesophageal reflux disease) 11/16/2013    PCP: Sharilyn Sites MD  REFERRING PROVIDER: Newman Pies MD Next apt: Sept 13thx 2023  REFERRING DIAG: M54.42 Chronic Bilateral LBP with left-sided sciatica   Rationale for Evaluation and Treatment Rehabilitation  THERAPY DIAG:  Other low back pain  Other abnormalities of gait and mobility  Repeated falls  ONSET DATE:    SUBJECTIVE:                                                                                                                                                                                           SUBJECTIVE STATEMENT: Patient states he had to fix some mowers yesterday. Some soreness today but not as much as yesterday in back, Some symptoms in lateral L leg.   PERTINENT HISTORY:  Imaging report: Severe lumbar spondylosis multilevel severe facet arthrosis Multi level severe disc height loss Sacrum and SI joints normal  Chronic Bilateral LBP with left-sided sciatica  unstable angina 06/25/2021  Neck surgery 6 years ago Right knee, meniscus surgery 2006 Left rotator cuff repair 2021 Hard of hearing  Heart catheterization jan 2023  PAIN:  Are you having pain? Yes: NPRS scale: 2//10 Pain location: Leg L  Pain description: sore, achey Aggravating factors: walking  Relieving factors: massage  Radicular: Lt LE posterior to knee   PRECAUTIONS: None  WEIGHT BEARING RESTRICTIONS No  FALLS:  Has patient fallen in last 6 months? Yes. Number of falls 10 patients foot caught both levels of patio and fell on right elbow. Pt correlates falls to uneven grass/log/root areas in backyard. Patient feels like he isnt picking up his feet high enough.   LIVING ENVIRONMENT: Lives with: lives with their spouse Lives in: House/apartment Stairs: Yes: External: 4-10 depending on location  steps; on right going up Has following equipment at home: None  OCCUPATION: retired   PLOF: Independent with basic ADLs and wall/furniture cruising for past 6-8 months. 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    PATIENT GOALS improve back strength, decrease pain, being able to stay upright.    OBJECTIVE:   DIAGNOSTIC FINDINGS:  Imaging report: Severe lumbar spondylosis multilevel severe facet arthrosis Multi level severe disc height loss Sacrum and SI joints  normal  PATIENT SURVEYS:  FOTO 42  SCREENING FOR RED FLAGS: Bowel or bladder incontinence: No Spinal tumors: No Cauda equina syndrome: No Compression fracture: No Abdominal aneurysm: No  COGNITION:  Overall cognitive status: Within functional limits for tasks assessed  SENSATION: WFL   POSTURE: rounded shoulders, decreased lumbar lordosis, and decreased thoracic kyphosis  PALPATION: TTP around left QL, left psis  Decrease in symptoms of radiation with prone extensions but reports increasing pain centrally in low back.   LUMBAR ROM:   Active  A/PROM  eval  Flexion 6 cm   Extension 2cm  Right lateral flexion 8 degrees (causes pain on left side)  Left lateral flexion 14 degrees  Right rotation   Left rotation    (Blank rows = not tested)  Flexion and extension measured using bisecting lines from PSIS, initial point 15 cm above.    LOWER EXTREMITY MMT:    MMT Right eval Left eval  Hip flexion 5 5  Hip extension 4 single leg bridge 4 single leg bridge  Hip abduction 4 4  Hip adduction 4 4  Hip internal rotation    Hip external rotation    Knee flexion 5 5  Knee extension 5 5  Ankle dorsiflexion 5 5  Ankle plantarflexion    Ankle inversion 5 5  Ankle eversion 5 5   (Blank rows = not tested)   FUNCTIONAL TESTS:  5 times sit to stand: 37.07 seconds 2 minute walk test: 280'  GAIT: Distance walked:280' Assistive device utilized: None Level of assistance: Complete Independence Comments: decreased cadence, inc knee flexion in stance,decreased trunk stability laterally     TODAY'S TREATMENT  12/11/21 Prone lying 5 minutes  Trigger Point Dry-Needling  Treatment instructions: Expect mild to moderate muscle soreness. S/S of pneumothorax if dry needled over a lung field, and to seek immediate medical attention should they occur. Patient verbalized understanding of these instructions and education.  Patient Consent Given: Yes Education handout  provided: Yes Muscles treated: lower lumbar paraspinals  Electrical stimulation performed: No Parameters: N/A Treatment response/outcome: twitch response elicited, decrease in symptoms following.    Manual: STM to lower lumbar paraspinals before DN for trigger point identification, during to assess response to treatment, and following DN    Prone heel squeeze 10 x 5 second holds Prone hip extension 1x 10 bilateral     12/10/21:  reviewed goals, educated importance of HEP compliance for maximal benefits  Educated importance of posture for pain control Standing: 3D hip excursion (lateral weight shift, rotate, controlled STS, extend) 10x Prone: POE x 2 min with 2 pillows under hips, spreads out back pain from central to sides, resolved radicular symptoms Supine: Diaphragmatic breathing 43mn  Isometric abdominal sets paired with exhalation 156m  Bent knee raise 10x 5" with ab set  Bridge 10x- c/o aggravation to lower back upon lowering, relief in the air  evaluation   PATIENT EDUCATION:  Education details:  6/21 centralization of symptoms, HEP, DN 12/10/21: Reviewed goals, educated importance of HEP compliance for maximal benefits and importance of posture.  Eval: Patient educated on exam findings, POC, scope of PT Person educated: Patient Education method: Explanation, Demonstration Education comprehension: verbalized understanding   HOME EXERCISE PROGRAM: 12/10/21: Prone on elbow with 2 pillow under hips Diaphragmatic breathing 71m38mIsometric abdominal sets paired with exhalation 71mi49mridge: 10x   ASSESSMENT:  CLINICAL IMPRESSION: Patient tolerates prone lying without pillows today but hips remain in slight flexed position in prone. Patient with hyperactive and tender lower L lumbar paraspinals. Tolerates dry needling well with decrease in tissue tension noted and patient stating decreased symptoms following. Added press ups but patient only able to complete in small range  due to limited extension ROM which decreases with reps due  to UE fatigue. Began glute strengthening in prone which is tolerated well. Patient will continue to benefit from physical therapy in order to improve function and reduce impairment.   OBJECTIVE IMPAIRMENTS Abnormal gait, decreased activity tolerance, decreased endurance, decreased mobility, decreased ROM, decreased strength, increased fascial restrictions, impaired flexibility, improper body mechanics, postural dysfunction, and pain.   ACTIVITY LIMITATIONS carrying, bending, sitting, standing, and squatting  PARTICIPATION LIMITATIONS: cleaning, laundry, shopping, community activity, and yard work  PERSONAL FACTORS 3+ comorbidities:     Angina, Anxiety or Panic Disorders, Arthritis, Back pain, Congestive Heart Failure or Heart Disease, Depression, Gastrointestinal Disease, Hearing Impairment, High Blood Pressure, Osteoporosis, Prior Surgery, Sleep dysfunction, Stroke or TIA are also affecting patient's functional outcome.   REHAB POTENTIAL: Good  CLINICAL DECISION MAKING: Stable/uncomplicated  EVALUATION COMPLEXITY: Moderate   GOALS: Goals reviewed with patient? Yes  SHORT TERM GOALS: Target date: 12/23/2021  Patient will be independent with HEP in order to improve functional outcomes. Baseline:  Goal status: IN PROGRESS  2.  Patient will report at least 25% improvement in symptoms for improved quality of life. Baseline:  Goal status: IN PROGRESS  3.  Patient will improve five time sit to stand by 10 seconds to improve efficiency in transfers.  Baseline: 5 times sit to stand: 37.07 seconds  Goal status: IN PROGRESS  LONG TERM GOALS: Target date: 01/20/2022  Patient will report at least 75% improvement in symptoms for improved quality of life. Baseline:  Goal status: IN PROGRESS  2.  Patient will improve FOTO score by at least 5 points in order to indicate improved tolerance to activity. Baseline: 42 Goal status: IN  PROGRESS  3.  Patient will report average back pain of no greater than 5/10 when completing household chores to improve quality of life.  Baseline: 8/10 Goal status: IN PROGRESS  4.  Patient will be able to ambulate at least 100 feet in 2MWT in order to demonstrate improved tolerance to activity. Baseline: 2 minute walk test: 280' Goal status: IN PROGRESS     PLAN: PT FREQUENCY: 2x/week  PT DURATION: other: 7 weeks  PLANNED INTERVENTIONS: PLANNED INTERVENTIONS: Therapeutic exercises, Therapeutic activity, Neuromuscular re-education, Balance training, Gait training, Patient/Family education, Joint manipulation, Joint mobilization, Stair training, Orthotic/Fit training, DME instructions, Aquatic Therapy, Dry Needling, Electrical stimulation, Spinal manipulation, Spinal mobilization, Cryotherapy, Moist heat, Compression bandaging, scar mobilization, Splintting, Taping, Traction, Ultrasound, Ionotophoresis '4mg'$ /ml Dexamethasone, and Manual therapy   PLAN FOR NEXT SESSION: F/U with HEP for LE/trunk stretching/mobility and core strength     Mearl Latin, PT 12/11/2021, 12:30 PM

## 2021-12-17 ENCOUNTER — Encounter (HOSPITAL_COMMUNITY): Payer: Self-pay | Admitting: Physical Therapy

## 2021-12-17 ENCOUNTER — Ambulatory Visit (HOSPITAL_COMMUNITY): Payer: No Typology Code available for payment source | Admitting: Physical Therapy

## 2021-12-17 DIAGNOSIS — M5459 Other low back pain: Secondary | ICD-10-CM | POA: Diagnosis not present

## 2021-12-17 DIAGNOSIS — R2689 Other abnormalities of gait and mobility: Secondary | ICD-10-CM

## 2021-12-17 NOTE — Therapy (Signed)
OUTPATIENT PHYSICAL THERAPY THORACOLUMBAR TREATMENT   Patient Name: Andrew Bates MRN: [id number] DOB:11-07-1945, 76 y.o., male Today's Date: 12/17/2021   PT End of Session - 12/17/21 1645     Visit Number 4    Number of Visits 15    Date for PT Re-Evaluation 01/20/22    [facility] Type [facility] (approved 15 visits)    Progress Note Due on Visit 15    PT Start Time 1644    PT Stop Time 1723    PT Time Calculation (min) 39 min    Activity Tolerance Patient tolerated treatment well              Past Medical History:  Diagnosis Date   Adenomatous colon polyp    Anxiety    Arthritis    Asthma    Coronary artery disease    Nonobstructive at cardiac catheterization 2020   Depression    DVT (deep venous thrombosis) (HCC) 2008   Essential hypertension    GERD (gastroesophageal reflux disease)    History of kidney stones    Hyperlipidemia    Internal hemorrhoids    Peripheral neuropathy    PTSD (post-traumatic stress disorder)    Sleep apnea    cpap   Vertigo    Past Surgical History:  Procedure Laterality Date   ANTERIOR CERVICAL DECOMP/DISCECTOMY FUSION N/A 07/09/2015   Procedure: ANTERIOR CERVICAL DECOMPRESSION/DISCECTOMY FUSION CERVICAL FIVE-SIX,CERVICAL SIX-SEVEN;  Surgeon: Tressie Stalker, MD;  Location: [facility];  Service: Neurosurgery;  Laterality: N/A;   CARPAL TUNNEL RELEASE Bilateral 2014   COLONOSCOPY     CYSTOSCOPY/RETROGRADE/URETEROSCOPY/STONE EXTRACTION WITH BASKET Left 02/14/2014   Procedure: CYSTOSCOPY AND LEFT DOUBLE J STENT PLACEMENT;  ATTEMPTED STONE  EXTRACTION ;  Surgeon: Chelsea Aus, MD;  Location: [facility];  Service: Urology;  Laterality: Left;   ENDOLYMPHATIC SAC DECOMPRESSION Left 06/14/2019   Procedure: [facility] DECOMPRESSION;  Surgeon: Newman Pies, MD;  Location: [facility];  Service: ENT;  Laterality: Left;   ESOPHAGOGASTRODUODENOSCOPY ENDOSCOPY     INTRAVASCULAR PRESSURE WIRE/FFR STUDY N/A  06/25/2021   Procedure: INTRAVASCULAR PRESSURE WIRE/FFR STUDY;  Surgeon: Marykay Lex, MD;  Location: [facility] CV LAB;  Service: Cardiovascular;  Laterality: N/A;   KNEE SURGERY Right 2006   Arthroscopy - MCl tear   LEFT HEART CATH AND CORONARY ANGIOGRAPHY N/A 08/17/2018   Procedure: LEFT HEART CATH AND CORONARY ANGIOGRAPHY;  Surgeon: Kathleene Hazel, MD;  Location: [facility];  Service: Cardiovascular;  Laterality: N/A;   LEFT HEART CATH AND CORONARY ANGIOGRAPHY N/A 06/25/2021   Procedure: LEFT HEART CATH AND CORONARY ANGIOGRAPHY;  Surgeon: Marykay Lex, MD;  Location: [facility] INVASIVE CV LAB;  Service: Cardiovascular;  Laterality: N/A;   UPPER GASTROINTESTINAL ENDOSCOPY     Patient Active Problem List   Diagnosis Date Noted   Stage 3b chronic kidney disease (CKD) (HCC) 06/24/2021   PTSD (post-traumatic stress disorder)    Coronary artery disease, non-occlusive-by cardiac catheterization in 2020 01/10/2019   Unstable angina (HCC) 08/17/2018   Chest pain    Cervical spondylosis with radiculopathy 07/09/2015   Urinary frequency 02/25/2015   Dehydration 02/25/2015   Vertigo 02/25/2015   Hydronephrosis 02/15/2014   AKI (acute kidney injury) (HCC) 02/15/2014   Calculus of ureter 02/14/2014   Urinary hesitancy 11/17/2013   Overweight(278.02) 11/17/2013   Kidney stones 11/16/2013   Acute onset of severe vertigo 11/16/2013   Sepsis secondary to UTI (HCC) 11/16/2013   Essential hypertension 11/16/2013  Hyperlipidemia with target LDL less than 70 11/16/2013   GERD (gastroesophageal reflux disease) 11/16/2013    PCP: Assunta Found MD  REFERRING PROVIDER: Tressie Stalker MD Next apt: Sept 13thx 2023  REFERRING DIAG: M54.42 Chronic Bilateral LBP with left-sided sciatica   Rationale for Evaluation and Treatment Rehabilitation  THERAPY DIAG:  Other low back pain  Other abnormalities of gait and mobility  ONSET DATE:   SUBJECTIVE:                                                                                                                                                                                            SUBJECTIVE STATEMENT: Patient says he liked dry needling last time, feels it helped. No radiating pain today.    PAIN:  Are you having pain? Yes: NPRS scale: 3//10 Pain location: Leg L  Pain description: sore, achey Aggravating factors: walking  Relieving factors: massage  Radicular: Lt LE posterior to knee  PERTINENT HISTORY:  Imaging report: Severe lumbar spondylosis multilevel severe facet arthrosis Multi level severe disc height loss Sacrum and SI joints normal  Chronic Bilateral LBP with left-sided sciatica  unstable angina 06/25/2021  Neck surgery 6 years ago Right knee, meniscus surgery 2006 Left rotator cuff repair 2021 Hard of hearing  Heart catheterization jan 2023     PRECAUTIONS: None  WEIGHT BEARING RESTRICTIONS No  FALLS:  Has patient fallen in last 6 months? Yes. Number of falls 10 patients foot caught both levels of patio and fell on right elbow. Pt correlates falls to uneven grass/log/root areas in backyard. Patient feels like he isnt picking up his feet high enough.   LIVING ENVIRONMENT: Lives with: lives with their spouse Lives in: House/apartment Stairs: Yes: External: 4-10 depending on location  steps; on right going up Has following equipment at home: None  OCCUPATION: retired   PLOF: Independent with basic ADLs and wall/furniture cruising for past 6-8 months. 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    PATIENT GOALS improve back strength, decrease pain, being able to stay upright.    OBJECTIVE:   DIAGNOSTIC FINDINGS:  Imaging report: Severe lumbar spondylosis multilevel severe facet arthrosis Multi level severe disc height loss Sacrum and SI joints normal  PATIENT SURVEYS:  FOTO 42  SCREENING FOR RED FLAGS: Bowel or bladder incontinence:  No Spinal tumors: No Cauda equina syndrome: No Compression fracture: No Abdominal aneurysm: No  COGNITION:  Overall cognitive status: Within functional limits for tasks assessed     SENSATION: WFL   POSTURE: rounded shoulders, decreased lumbar lordosis, and decreased  thoracic kyphosis  PALPATION: TTP around left QL, left psis  Decrease in symptoms of radiation with prone extensions but reports increasing pain centrally in low back.   LUMBAR ROM:   Active  A/PROM  eval  Flexion 6 cm   Extension 2cm  Right lateral flexion 8 degrees (causes pain on left side)  Left lateral flexion 14 degrees  Right rotation   Left rotation    (Blank rows = not tested)  Flexion and extension measured using bisecting lines from PSIS, initial point 15 cm above.    LOWER EXTREMITY MMT:    MMT Right eval Left eval  Hip flexion 5 5  Hip extension 4 single leg bridge 4 single leg bridge  Hip abduction 4 4  Hip adduction 4 4  Hip internal rotation    Hip external rotation    Knee flexion 5 5  Knee extension 5 5  Ankle dorsiflexion 5 5  Ankle plantarflexion    Ankle inversion 5 5  Ankle eversion 5 5   (Blank rows = not tested)   FUNCTIONAL TESTS:  5 times sit to stand: 37.07 seconds 2 minute walk test: 280'  GAIT: Distance walked:280' Assistive device utilized: None Level of assistance: Complete Independence Comments: decreased cadence, inc knee flexion in stance,decreased trunk stability laterally     TODAY'S TREATMENT  12/17/21 Ab brace 10 x 5" Ab march x15 SLR with ab brace x10  Bridge 2 x 10 (increased pain in LT posterior thigh)  POE 3 min (abolished pain in LT thigh)  Sidelying hip abduction 2 x 10     Trigger Point Dry-Needling  Treatment instructions: Expect mild to moderate muscle soreness. S/S of pneumothorax if dry needled over a lung field, and to seek immediate medical attention should they occur. Patient verbalized understanding of these instructions and  education.  Patient Consent Given: Yes Education handout provided: Yes Muscles treated: lower lumbar paraspinals  Electrical stimulation performed: No Parameters: N/A Treatment response/outcome: twitch response elicited, decrease in symptoms following.    Manual: STM to lower lumbar paraspinals before DN for trigger point identification, during to assess response to treatment, and following DN    12/11/21 Prone lying 5 minutes  Trigger Point Dry-Needling  Treatment instructions: Expect mild to moderate muscle soreness. S/S of pneumothorax if dry needled over a lung field, and to seek immediate medical attention should they occur. Patient verbalized understanding of these instructions and education.  Patient Consent Given: Yes Education handout provided: Yes Muscles treated: lower lumbar paraspinals  Electrical stimulation performed: No Parameters: N/A Treatment response/outcome: twitch response elicited, decrease in symptoms following.    Manual: STM to lower lumbar paraspinals before DN for trigger point identification, during to assess response to treatment, and following DN    Prone heel squeeze 10 x 5 second holds Prone hip extension 1x 10 bilateral     12/10/21:  reviewed goals, educated importance of HEP compliance for maximal benefits  Educated importance of posture for pain control Standing: 3D hip excursion (lateral weight shift, rotate, controlled STS, extend) 10x Prone: POE x 2 min with 2 pillows under hips, spreads out back pain from central to sides, resolved radicular symptoms Supine: Diaphragmatic breathing  Isometric abdominal sets paired with exhalation  Bent knee raise 10x 5" with ab set  Bridge 10x- c/o aggravation to lower back upon lowering, relief in the air  evaluation   PATIENT EDUCATION:  Education details:  6/21 centralization of symptoms, HEP, DN 12/10/21: Reviewed goals, educated  importance of HEP compliance for maximal benefits and  importance of posture.  Eval: Patient educated on exam findings, POC, scope of PT Person educated: Patient Education method: Explanation, Demonstration Education comprehension: verbalized understanding   HOME EXERCISE PROGRAM: 12/17/21 Access Code: [email or web address] URL: [email or web address] Date: 12/17/2021 Prepared by: Georges Lynch  Exercises - Sidelying Hip Abduction  - 2 x daily - 7 x weekly - 2 sets - 10 reps 12/10/21: Prone on elbow with 2 pillow under hips Diaphragmatic breathing Isometric abdominal sets paired with exhalation Bridge: 10x   ASSESSMENT:  CLINICAL IMPRESSION: Patient tolerated session well today. Noting increased burning in LT thigh during bridge. Radicular symptoms abolished with POE. Educated patient and his wife on spine mechanics. Added sidelying hip abduction for LE strengthening. Added to HEP. Good response to dry needling, noting improved symptoms following. Patient will continue to benefit from skilled therapy services to reduce remaining deficits and improve functional ability.    OBJECTIVE IMPAIRMENTS Abnormal gait, decreased activity tolerance, decreased endurance, decreased mobility, decreased ROM, decreased strength, increased fascial restrictions, impaired flexibility, improper body mechanics, postural dysfunction, and pain.   ACTIVITY LIMITATIONS carrying, bending, sitting, standing, and squatting  PARTICIPATION LIMITATIONS: cleaning, laundry, shopping, community activity, and yard work  PERSONAL FACTORS 3+ comorbidities:     Angina, Anxiety or Panic Disorders, Arthritis, Back pain, Congestive Heart Failure or Heart Disease, Depression, Gastrointestinal Disease, Hearing Impairment, High Blood Pressure, Osteoporosis, Prior Surgery, Sleep dysfunction, Stroke or TIA are also affecting patient's functional outcome.   REHAB POTENTIAL: Good  CLINICAL DECISION MAKING: Stable/uncomplicated  EVALUATION COMPLEXITY:  Moderate   GOALS: Goals reviewed with patient? Yes  SHORT TERM GOALS: Target date: 12/23/2021  Patient will be independent with HEP in order to improve functional outcomes. Baseline:  Goal status: IN PROGRESS  2.  Patient will report at least 25% improvement in symptoms for improved quality of life. Baseline:  Goal status: IN PROGRESS  3.  Patient will improve five time sit to stand by 10 seconds to improve efficiency in transfers.  Baseline: 5 times sit to stand: 37.07 seconds  Goal status: IN PROGRESS  LONG TERM GOALS: Target date: 01/20/2022  Patient will report at least 75% improvement in symptoms for improved quality of life. Baseline:  Goal status: IN PROGRESS  2.  Patient will improve FOTO score by at least 5 points in order to indicate improved tolerance to activity. Baseline: 42 Goal status: IN PROGRESS  3.  Patient will report average back pain of no greater than 5/10 when completing household chores to improve quality of life.  Baseline: 8/10 Goal status: IN PROGRESS  4.  Patient will be able to ambulate at least 100 feet in in order to demonstrate improved tolerance to activity. Baseline: 2 minute walk test: 280' Goal status: IN PROGRESS     PLAN: PT FREQUENCY: 2x/week  PT DURATION: other: 7 weeks  PLANNED INTERVENTIONS: PLANNED INTERVENTIONS: Therapeutic exercises, Therapeutic activity, Neuromuscular re-education, Balance training, Gait training, Patient/Family education, Joint manipulation, Joint mobilization, Stair training, Orthotic/Fit training, DME instructions, Aquatic Therapy, Dry Needling, Electrical stimulation, Spinal manipulation, Spinal mobilization, Cryotherapy, Moist heat, Compression bandaging, scar mobilization, Splintting, Taping, Traction, Ultrasound, Ionotophoresis 4mg /ml Dexamethasone, and Manual therapy   PLAN FOR NEXT SESSION: F/U with HEP for LE/trunk stretching/mobility and core strength    4:46 PM, 12/17/21 Georges Lynch  PT DPT  Physical Therapist with [place]  [facility]  [phone]

## 2021-12-20 ENCOUNTER — Encounter (HOSPITAL_COMMUNITY): Payer: PPO

## 2021-12-26 ENCOUNTER — Encounter (HOSPITAL_COMMUNITY): Payer: Self-pay | Admitting: Physical Therapy

## 2021-12-26 ENCOUNTER — Ambulatory Visit (HOSPITAL_COMMUNITY): Payer: No Typology Code available for payment source | Attending: Family Medicine | Admitting: Physical Therapy

## 2021-12-26 DIAGNOSIS — R296 Repeated falls: Secondary | ICD-10-CM | POA: Diagnosis present

## 2021-12-26 DIAGNOSIS — M5459 Other low back pain: Secondary | ICD-10-CM | POA: Diagnosis present

## 2021-12-26 DIAGNOSIS — R2689 Other abnormalities of gait and mobility: Secondary | ICD-10-CM | POA: Diagnosis present

## 2021-12-26 NOTE — Therapy (Signed)
OUTPATIENT PHYSICAL THERAPY THORACOLUMBAR TREATMENT   Patient Name: Andrew Bates MRN: [id number] DOB:[birth year], 76 y.o., male Today's Date: 12/26/2021   PT End of Session - 12/26/21 1035     Visit Number 5    Number of Visits 15    Date for PT Re-Evaluation 01/20/22    [facility] Type [facility] (approved 15 visits)    Progress Note Due on Visit 15    PT Start Time 1034    PT Stop Time 1115    PT Time Calculation (min) 41 min    Activity Tolerance Patient tolerated treatment well              Past Medical History:  Diagnosis Date   Adenomatous colon polyp    Anxiety    Arthritis    Asthma    Coronary artery disease    Nonobstructive at cardiac catheterization 2020   Depression    DVT (deep venous thrombosis) ([place]) 2008   Essential hypertension    GERD (gastroesophageal reflux disease)    History of kidney stones    Hyperlipidemia    Internal hemorrhoids    Peripheral neuropathy    PTSD (post-traumatic stress disorder)    Sleep apnea    cpap   Vertigo    Past Surgical History:  Procedure Laterality Date   ANTERIOR CERVICAL DECOMP/DISCECTOMY FUSION N/A 07/09/2015   Procedure: ANTERIOR CERVICAL DECOMPRESSION/DISCECTOMY FUSION CERVICAL FIVE-SIX,CERVICAL SIX-SEVEN;  Surgeon: Newman Pies, MD;  Location: [facility];  Service: Neurosurgery;  Laterality: N/A;   CARPAL TUNNEL RELEASE Bilateral 2014   COLONOSCOPY     CYSTOSCOPY/RETROGRADE/URETEROSCOPY/STONE EXTRACTION WITH BASKET Left 02/14/2014   Procedure: CYSTOSCOPY AND LEFT DOUBLE J STENT PLACEMENT;  ATTEMPTED STONE  EXTRACTION ;  Surgeon: Jorja Loa, MD;  Location: [facility];  Service: Urology;  Laterality: Left;   ENDOLYMPHATIC SAC DECOMPRESSION Left 06/14/2019   Procedure: [facility] DECOMPRESSION;  Surgeon: Leta Baptist, MD;  Location: [place];  Service: ENT;  Laterality: Left;   ESOPHAGOGASTRODUODENOSCOPY ENDOSCOPY     INTRAVASCULAR PRESSURE WIRE/FFR STUDY N/A 06/25/2021    Procedure: INTRAVASCULAR PRESSURE WIRE/FFR STUDY;  Surgeon: Leonie Man, MD;  Location: [place] [facility];  Service: Cardiovascular;  Laterality: N/A;   KNEE SURGERY Right 2006   Arthroscopy - MCl tear   LEFT HEART CATH AND CORONARY ANGIOGRAPHY N/A 08/17/2018   Procedure: LEFT HEART CATH AND CORONARY ANGIOGRAPHY;  Surgeon: Burnell Blanks, MD;  Location: [place] [facility];  Service: Cardiovascular;  Laterality: N/A;   LEFT HEART CATH AND CORONARY ANGIOGRAPHY N/A 06/25/2021   Procedure: LEFT HEART CATH AND CORONARY ANGIOGRAPHY;  Surgeon: Leonie Man, MD;  Location: [place] [facility];  Service: Cardiovascular;  Laterality: N/A;   UPPER GASTROINTESTINAL ENDOSCOPY     Patient Active Problem List   Diagnosis Date Noted   Stage 3b chronic kidney disease (CKD) ([place]) 06/24/2021   PTSD (post-traumatic stress disorder)    Coronary artery disease, non-occlusive-by cardiac catheterization in 2020 01/10/2019   Unstable angina ([place]) 08/17/2018   Chest pain    Cervical spondylosis with radiculopathy 07/09/2015   Urinary frequency 02/25/2015   Dehydration 02/25/2015   Vertigo 02/25/2015   Hydronephrosis 02/15/2014   AKI (acute kidney injury) ([place]) 02/15/2014   Calculus of ureter 02/14/2014   Urinary hesitancy 11/17/2013   Overweight(278.02) 11/17/2013   Kidney stones 11/16/2013   Acute onset of severe vertigo 11/16/2013   Sepsis secondary to UTI ([place]) 11/16/2013   Essential hypertension 11/16/2013  Hyperlipidemia with target LDL less than 70 11/16/2013   GERD (gastroesophageal reflux disease) 11/16/2013    PCP: Sharilyn Sites MD  REFERRING PROVIDER: Newman Pies MD Next apt: Sept 13thx 2023  REFERRING DIAG: M54.42 Chronic Bilateral LBP with left-sided sciatica   Rationale for Evaluation and Treatment Rehabilitation  THERAPY DIAG:  Other low back pain  Other abnormalities of gait and mobility  Repeated falls  ONSET DATE:   SUBJECTIVE:                                                                                                                                                                                            SUBJECTIVE STATEMENT: Patient says he had a few incidents since last time. He tripped in his back yard and landed on his RT elbow. Also [place] and felt a "jolt" in his back. His back has been sore since this. He has non been doing HEP.   PAIN:  Are you having pain? Yes: NPRS scale: 3//10 Pain location: Leg L  Pain description: sore, achey Aggravating factors: walking  Relieving factors: massage  Radicular: Lt LE posterior to knee  PERTINENT HISTORY:  Imaging report: Severe lumbar spondylosis multilevel severe facet arthrosis Multi level severe disc height loss Sacrum and SI joints normal  Chronic Bilateral LBP with left-sided sciatica  unstable angina 06/25/2021  Neck surgery 6 years ago Right knee, meniscus surgery 2006 Left rotator cuff repair 2021 Hard of hearing  Heart catheterization jan 2023     PRECAUTIONS: None  WEIGHT BEARING RESTRICTIONS No  FALLS:  Has patient fallen in last 6 months? Yes. Number of falls 10 patients foot caught both levels of patio and fell on right elbow. Pt correlates falls to uneven grass/log/root areas in backyard. Patient feels like he isnt picking up his feet high enough.   LIVING ENVIRONMENT: Lives with: lives with their spouse Lives in: House/apartment Stairs: Yes: External: 4-10 depending on location  steps; on right going up Has following equipment at home: None  OCCUPATION: retired   PLOF: Independent with basic ADLs and wall/furniture cruising for past 6-8 months. 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    PATIENT GOALS improve back strength, decrease pain, being able to stay upright.    OBJECTIVE:   DIAGNOSTIC FINDINGS:  Imaging report: Severe lumbar spondylosis multilevel severe facet  arthrosis Multi level severe disc height loss Sacrum and SI joints normal  PATIENT SURVEYS:  FOTO 42  SCREENING FOR RED FLAGS: Bowel or bladder incontinence: No Spinal tumors: No  Cauda equina syndrome: No Compression fracture: No Abdominal aneurysm: No  COGNITION:  Overall cognitive status: Within functional limits for tasks assessed     SENSATION: WFL   POSTURE: rounded shoulders, decreased lumbar lordosis, and decreased thoracic kyphosis  PALPATION: TTP around left QL, left psis  Decrease in symptoms of radiation with prone extensions but reports increasing pain centrally in low back.   LUMBAR ROM:   Active  A/PROM  eval  Flexion 6 cm   Extension 2cm  Right lateral flexion 8 degrees (causes pain on left side)  Left lateral flexion 14 degrees  Right rotation   Left rotation    (Blank rows = not tested)  Flexion and extension measured using bisecting lines from PSIS, initial point 15 cm above.    LOWER EXTREMITY MMT:    MMT Right eval Left eval  Hip flexion 5 5  Hip extension 4 single leg bridge 4 single leg bridge  Hip abduction 4 4  Hip adduction 4 4  Hip internal rotation    Hip external rotation    Knee flexion 5 5  Knee extension 5 5  Ankle dorsiflexion 5 5  Ankle plantarflexion    Ankle inversion 5 5  Ankle eversion 5 5   (Blank rows = not tested)   FUNCTIONAL TESTS:  5 times sit to stand: 37.07 seconds 2 minute walk test: 280'  GAIT: Distance walked:280' Assistive device utilized: None Level of assistance: Complete [place] Comments: decreased cadence, inc knee flexion in stance,decreased trunk stability laterally     TODAY'S TREATMENT  12/26/21 POE 3 min     Ab march x15     Bridge x15    Sidelying leg raise x10 each     LTR 10 x 5"   Standing:   Heel raise x 10    Tandam stance x30" each    Trigger Point Dry-Needling  Treatment instructions: Expect mild to moderate muscle soreness. S/S of pneumothorax if dry needled  over a lung field, and to seek immediate medical attention should they occur. Patient verbalized understanding of these instructions and education.  Patient Consent Given: Yes Education handout provided: Yes Muscles treated: lower lumbar paraspinals  Electrical stimulation performed: No Parameters: N/A Treatment response/outcome: twitch response elicited, decrease in symptoms following.    Manual: STM to lower lumbar paraspinals before DN for trigger point identification, during to assess response to treatment, and following DN    12/17/21 Ab brace 10 x 5" Ab march x15 SLR with ab brace x10  Bridge 2 x 10 (increased pain in LT posterior thigh)  POE 3 min (abolished pain in LT thigh)  Sidelying hip abduction 2 x 10     Trigger Point Dry-Needling  Treatment instructions: Expect mild to moderate muscle soreness. S/S of pneumothorax if dry needled over a lung field, and to seek immediate medical attention should they occur. Patient verbalized understanding of these instructions and education.  Patient Consent Given: Yes Education handout provided: Yes Muscles treated: lower lumbar paraspinals  Electrical stimulation performed: No Parameters: N/A Treatment response/outcome: twitch response elicited, decrease in symptoms following.    Manual: STM to lower lumbar paraspinals before DN for trigger point identification, during to assess response to treatment, and following DN    12/11/21 Prone lying 5 minutes  Trigger Point Dry-Needling  Treatment instructions: Expect mild to moderate muscle soreness. S/S of pneumothorax if dry needled over a lung field, and to seek immediate medical attention should they occur. Patient verbalized understanding of these  instructions and education.  Patient Consent Given: Yes Education handout provided: Yes Muscles treated: lower lumbar paraspinals  Electrical stimulation performed: No Parameters: N/A Treatment response/outcome: twitch response  elicited, decrease in symptoms following.    Manual: STM to lower lumbar paraspinals before DN for trigger point identification, during to assess response to treatment, and following DN    Prone heel squeeze 10 x 5 second holds Prone hip extension 1x 10 bilateral     12/10/21:  reviewed goals, educated importance of HEP compliance for maximal benefits  Educated importance of posture for pain control Standing: 3D hip excursion (lateral weight shift, rotate, controlled STS, extend) 10x Prone: POE x 2 min with 2 pillows under hips, spreads out back pain from central to sides, resolved radicular symptoms Supine: Diaphragmatic breathing 63mn  Isometric abdominal sets paired with exhalation 12m  Bent knee raise 10x 5" with ab set  Bridge 10x- c/o aggravation to lower back upon lowering, relief in the air  evaluation   PATIENT EDUCATION:  Education details:  6/21 centralization of symptoms, HEP, DN 12/10/21: Reviewed goals, educated importance of HEP compliance for maximal benefits and importance of posture.  Eval: Patient educated on exam findings, POC, scope of PT Person educated: Patient Education method: Explanation, Demonstration Education comprehension: verbalized understanding   HOME EXERCISE PROGRAM: 12/17/21 Access Code: [email or web address]: [email or web address] Date: 12/17/2021 Prepared by: CaJosue HectorExercises - Sidelying Hip Abduction  - 2 x daily - 7 x weekly - 2 sets - 10 reps 12/10/21: Prone on elbow with 2 pillow under hips Diaphragmatic breathing 29m25mIsometric abdominal sets paired with exhalation [id number]: 10x   ASSESSMENT:  CLINICAL IMPRESSION: Patient tolerated session well today. Progressed LE strength and lumbar mobility. Patient educated on purpose and function of added LTR and standing heel raise, as well as added balance activity. Patient slightly challenged with standing balance. Patient noting improved back pain following  treatment. Patient will continue to benefit from skilled therapy services to reduce remaining deficits and improve functional ability.    OBJECTIVE IMPAIRMENTS Abnormal gait, decreased activity tolerance, decreased endurance, decreased mobility, decreased ROM, decreased strength, increased fascial restrictions, impaired flexibility, improper body mechanics, postural dysfunction, and pain.   ACTIVITY LIMITATIONS carrying, bending, sitting, standing, and squatting  PARTICIPATION LIMITATIONS: cleaning, laundry, shopping, community activity, and yard work  PERSONAL FACTORS 3+ comorbidities:     Angina, Anxiety or Panic Disorders, Arthritis, Back pain, Congestive Heart Failure or Heart Disease, Depression, Gastrointestinal Disease, Hearing Impairment, High Blood Pressure, Osteoporosis, Prior Surgery, Sleep dysfunction, Stroke or TIA are also affecting patient's functional outcome.   REHAB POTENTIAL: Good  CLINICAL DECISION MAKING: Stable/uncomplicated  EVALUATION COMPLEXITY: Moderate   GOALS: Goals reviewed with patient? Yes  SHORT TERM GOALS: Target date: 12/23/2021  Patient will be independent with HEP in order to improve functional outcomes. Baseline:  Goal status: IN PROGRESS  2.  Patient will report at least 25% improvement in symptoms for improved quality of life. Baseline:  Goal status: IN PROGRESS  3.  Patient will improve five time sit to stand by 10 seconds to improve efficiency in transfers.  Baseline: 5 times sit to stand: 37.07 seconds  Goal status: IN PROGRESS  LONG TERM GOALS: Target date: 01/20/2022  Patient will report at least 75% improvement in symptoms for improved quality of life. Baseline:  Goal status: IN PROGRESS  2.  Patient will improve FOTO score by at least 5 points in order to indicate improved tolerance to activity. Baseline: 42 Goal status: IN  PROGRESS  3.  Patient will report average back pain of no greater than 5/10 when completing household  chores to improve quality of life.  Baseline: 8/10 Goal status: IN PROGRESS  4.  Patient will be able to ambulate at least 100 feet in 2MWT in order to demonstrate improved tolerance to activity. Baseline: 2 minute walk test: 280' Goal status: IN PROGRESS     PLAN: PT FREQUENCY: 2x/week  PT DURATION: other: 7 weeks  PLANNED INTERVENTIONS: PLANNED INTERVENTIONS: Therapeutic exercises, Therapeutic activity, Neuromuscular re-education, Balance training, Gait training, Patient/Family education, Joint manipulation, Joint mobilization, Stair training, Orthotic/Fit training, DME instructions, Aquatic Therapy, Dry Needling, Electrical stimulation, Spinal manipulation, Spinal mobilization, Cryotherapy, Moist heat, Compression bandaging, scar mobilization, Splintting, Taping, Traction, Ultrasound, Ionotophoresis '4mg'$ /ml Dexamethasone, and Manual therapy   PLAN FOR NEXT SESSION:  Continue balance,  stretching/mobility and core strength    11:16 AM, 12/26/21 Josue Hector PT DPT  Physical Therapist with [facility]  [phone]

## 2021-12-31 ENCOUNTER — Ambulatory Visit (HOSPITAL_COMMUNITY): Payer: No Typology Code available for payment source

## 2021-12-31 ENCOUNTER — Encounter (HOSPITAL_COMMUNITY): Payer: Self-pay

## 2021-12-31 DIAGNOSIS — R2689 Other abnormalities of gait and mobility: Secondary | ICD-10-CM

## 2021-12-31 DIAGNOSIS — M5459 Other low back pain: Secondary | ICD-10-CM | POA: Diagnosis not present

## 2021-12-31 DIAGNOSIS — R296 Repeated falls: Secondary | ICD-10-CM

## 2021-12-31 NOTE — Therapy (Signed)
OUTPATIENT PHYSICAL THERAPY THORACOLUMBAR TREATMENT   Patient Name: Andrew Bates MRN: [id number] DOB:June 15, 1946, 76 y.o., male Today's Date: 12/31/2021      Past Medical History:  Diagnosis Date   Adenomatous colon polyp    Anxiety    Arthritis    Asthma    Coronary artery disease    Nonobstructive at cardiac catheterization 2020   Depression    DVT (deep venous thrombosis) ([place]) 2008   Essential hypertension    GERD (gastroesophageal reflux disease)    History of kidney stones    Hyperlipidemia    Internal hemorrhoids    Peripheral neuropathy    PTSD (post-traumatic stress disorder)    Sleep apnea    cpap   Vertigo    Past Surgical History:  Procedure Laterality Date   ANTERIOR CERVICAL DECOMP/DISCECTOMY FUSION N/A 07/09/2015   Procedure: ANTERIOR CERVICAL DECOMPRESSION/DISCECTOMY FUSION CERVICAL FIVE-SIX,CERVICAL SIX-SEVEN;  Surgeon: Newman Pies, MD;  Location: [facility];  Service: Neurosurgery;  Laterality: N/A;   CARPAL TUNNEL RELEASE Bilateral 2014   COLONOSCOPY     CYSTOSCOPY/RETROGRADE/URETEROSCOPY/STONE EXTRACTION WITH BASKET Left 02/14/2014   Procedure: CYSTOSCOPY AND LEFT DOUBLE J STENT PLACEMENT;  ATTEMPTED STONE  EXTRACTION ;  Surgeon: Jorja Loa, MD;  Location: [facility];  Service: Urology;  Laterality: Left;   ENDOLYMPHATIC SAC DECOMPRESSION Left 06/14/2019   Procedure: [facility] DECOMPRESSION;  Surgeon: Leta Baptist, MD;  Location: [facility];  Service: ENT;  Laterality: Left;   ESOPHAGOGASTRODUODENOSCOPY ENDOSCOPY     INTRAVASCULAR PRESSURE WIRE/FFR STUDY N/A 06/25/2021   Procedure: INTRAVASCULAR PRESSURE WIRE/FFR STUDY;  Surgeon: Leonie Man, MD;  Location: [place] [facility];  Service: Cardiovascular;  Laterality: N/A;   KNEE SURGERY Right 2006   Arthroscopy - MCl tear   LEFT HEART CATH AND CORONARY ANGIOGRAPHY N/A 08/17/2018   Procedure: LEFT HEART CATH AND CORONARY ANGIOGRAPHY;  Surgeon: Burnell Blanks, MD;   Location: [place] [facility];  Service: Cardiovascular;  Laterality: N/A;   LEFT HEART CATH AND CORONARY ANGIOGRAPHY N/A 06/25/2021   Procedure: LEFT HEART CATH AND CORONARY ANGIOGRAPHY;  Surgeon: Leonie Man, MD;  Location: [place] [facility];  Service: Cardiovascular;  Laterality: N/A;   UPPER GASTROINTESTINAL ENDOSCOPY     Patient Active Problem List   Diagnosis Date Noted   Stage 3b chronic kidney disease (CKD) ([place]) 06/24/2021   PTSD (post-traumatic stress disorder)    Coronary artery disease, non-occlusive-by cardiac catheterization in 2020 01/10/2019   Unstable angina ([place]) 08/17/2018   Chest pain    Cervical spondylosis with radiculopathy 07/09/2015   Urinary frequency 02/25/2015   Dehydration 02/25/2015   Vertigo 02/25/2015   Hydronephrosis 02/15/2014   AKI (acute kidney injury) ([place]) 02/15/2014   Calculus of ureter 02/14/2014   Urinary hesitancy 11/17/2013   Overweight(278.02) 11/17/2013   Kidney stones 11/16/2013   Acute onset of severe vertigo 11/16/2013   Sepsis secondary to UTI ([place]) 11/16/2013   Essential hypertension 11/16/2013   Hyperlipidemia with target LDL less than 70 11/16/2013   GERD (gastroesophageal reflux disease) 11/16/2013    PCP: Sharilyn Sites MD  REFERRING PROVIDER: Newman Pies MD Next apt: August 18th 2023  REFERRING DIAG: M54.42 Chronic Bilateral LBP with left-sided sciatica   Rationale for Evaluation and Treatment Rehabilitation  THERAPY DIAG:  No diagnosis found.  ONSET DATE:   SUBJECTIVE:  SUBJECTIVE STATEMENT: Pt stated he is feeling good today.  Arrived with wife today.  Pain minimal on left side of center of lower back, pain scale 2/10.  Reports he is sore from roof leak, been on ladder installing insulation this weekend.    PAIN:  Are you  having pain? Yes: NPRS scale: 2/10 Pain location: Lt side of lower back Pain description: sore, achey Aggravating factors: walking  Relieving factors: massage  Radicular: Lt LE posterior to knee  PERTINENT HISTORY:  Imaging report: Severe lumbar spondylosis multilevel severe facet arthrosis Multi level severe disc height loss Sacrum and SI joints normal  Chronic Bilateral LBP with left-sided sciatica  unstable angina 06/25/2021  Neck surgery 6 years ago Right knee, meniscus surgery 2006 Left rotator cuff repair 2021 Hard of hearing  Heart catheterization jan 2023     PRECAUTIONS: None  WEIGHT BEARING RESTRICTIONS No  FALLS:  Has patient fallen in last 6 months? Yes. Number of falls 10 patients foot caught both levels of patio and fell on right elbow. Pt correlates falls to uneven grass/log/root areas in backyard. Patient feels like he isnt picking up his feet high enough.   LIVING ENVIRONMENT: Lives with: lives with their spouse Lives in: House/apartment Stairs: Yes: External: 4-10 depending on location  steps; on right going up Has following equipment at home: None  OCCUPATION: retired   PLOF: Independent with basic ADLs and wall/furniture cruising for past 6-8 months. 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    PATIENT GOALS improve back strength, decrease pain, being able to stay upright.    OBJECTIVE:   DIAGNOSTIC FINDINGS:  Imaging report: Severe lumbar spondylosis multilevel severe facet arthrosis Multi level severe disc height loss Sacrum and SI joints normal  PATIENT SURVEYS:  FOTO 42  SCREENING FOR RED FLAGS: Bowel or bladder incontinence: No Spinal tumors: No Cauda equina syndrome: No Compression fracture: No Abdominal aneurysm: No  COGNITION:  Overall cognitive status: Within functional limits for tasks assessed     SENSATION: WFL   POSTURE: rounded shoulders, decreased lumbar lordosis, and  decreased thoracic kyphosis  PALPATION: TTP around left QL, left psis  Decrease in symptoms of radiation with prone extensions but reports increasing pain centrally in low back.   LUMBAR ROM:   Active  A/PROM  eval  Flexion 6 cm   Extension 2cm  Right lateral flexion 8 degrees (causes pain on left side)  Left lateral flexion 14 degrees  Right rotation   Left rotation    (Blank rows = not tested)  Flexion and extension measured using bisecting lines from PSIS, initial point 15 cm above.    LOWER EXTREMITY MMT:    MMT Right eval Left eval  Hip flexion 5 5  Hip extension 4 single leg bridge 4 single leg bridge  Hip abduction 4 4  Hip adduction 4 4  Hip internal rotation    Hip external rotation    Knee flexion 5 5  Knee extension 5 5  Ankle dorsiflexion 5 5  Ankle plantarflexion    Ankle inversion 5 5  Ankle eversion 5 5   (Blank rows = not tested)   FUNCTIONAL TESTS:  5 times sit to stand: 37.07 seconds 2 minute walk test: 280'  GAIT: Distance walked:280' Assistive device utilized: None Level of assistance: Complete Independence Comments: decreased cadence, inc knee flexion in stance,decreased trunk stability laterally     TODAY'S TREATMENT  12/31/21: Wall arch with heel raise 10x 3" Controlled sit to stand with erect posture and eccentric control Tandem stance 3x 30" (3rd set partial tandem with pallof GTB 4x 10 Sidestep 2RT RTB around thigh  Seated: nerve glide 3x 10  Prone: POE 3 min with MHP x 5 min at EOS  12/26/21 POE 3 min     Ab march x15     Bridge x15    Sidelying leg raise x10 each     LTR 10 x 5"   Standing:   Heel raise x 10    Tandam stance x30" each    Trigger Point Dry-Needling  Treatment instructions: Expect mild to moderate muscle soreness. S/S of pneumothorax if dry needled over a lung field, and to seek immediate medical attention should they occur. Patient verbalized understanding of these instructions and  education.  Patient Consent Given: Yes Education handout provided: Yes Muscles treated: lower lumbar paraspinals  Electrical stimulation performed: No Parameters: N/A Treatment response/outcome: twitch response elicited, decrease in symptoms following.    Manual: STM to lower lumbar paraspinals before DN for trigger point identification, during to assess response to treatment, and following DN    12/17/21 Ab brace 10 x 5" Ab march x15 SLR with ab brace x10  Bridge 2 x 10 (increased pain in LT posterior thigh)  POE 3 min (abolished pain in LT thigh)  Sidelying hip abduction 2 x 10     Trigger Point Dry-Needling  Treatment instructions: Expect mild to moderate muscle soreness. S/S of pneumothorax if dry needled over a lung field, and to seek immediate medical attention should they occur. Patient verbalized understanding of these instructions and education.  Patient Consent Given: Yes Education handout provided: Yes Muscles treated: lower lumbar paraspinals  Electrical stimulation performed: No Parameters: N/A Treatment response/outcome: twitch response elicited, decrease in symptoms following.    Manual: STM to lower lumbar paraspinals before DN for trigger point identification, during to assess response to treatment, and following DN    12/11/21 Prone lying 5 minutes  Trigger Point Dry-Needling  Treatment instructions: Expect mild to moderate muscle soreness. S/S of pneumothorax if dry needled over a lung field, and to seek immediate medical attention should they occur. Patient verbalized understanding of these instructions and education.  Patient Consent Given: Yes Education handout provided: Yes Muscles treated: lower lumbar paraspinals  Electrical stimulation performed: No Parameters: N/A Treatment response/outcome: twitch response elicited, decrease in symptoms following.    Manual: STM to lower lumbar paraspinals before DN for trigger point identification, during to  assess response to treatment, and following DN    Prone heel squeeze 10 x 5 second holds Prone hip extension 1x 10 bilateral     12/10/21:  reviewed goals, educated importance of HEP compliance for maximal benefits  Educated importance of posture for pain control Standing: 3D hip excursion (lateral weight shift, rotate, controlled STS, extend) 10x Prone: POE x 2 min with 2 pillows under hips, spreads out back pain from central to sides, resolved radicular symptoms Supine: Diaphragmatic breathing 33mn  Isometric abdominal sets paired with exhalation 163m  Bent knee raise 10x 5" with ab set  Bridge 10x- c/o aggravation to lower back upon lowering, relief in the air  evaluation   PATIENT EDUCATION:  Education details:  6/21 centralization of symptoms, HEP, DN 12/10/21: Reviewed goals, educated importance of HEP compliance for maximal benefits and importance of posture.  Eval: Patient educated on exam findings, POC, scope of PT Person educated: Patient Education  method: Explanation, Demonstration Education comprehension: verbalized understanding   HOME EXERCISE PROGRAM: 12/17/21 Access Code: [email or web address] URL: [email or web address] Date: 12/17/2021 Prepared by: Josue Hector  Exercises - Sidelying Hip Abduction  - 2 x daily - 7 x weekly - 2 sets - 10 reps 12/10/21: Prone on elbow with 2 pillow under hips Diaphragmatic breathing 26mn Isometric abdominal sets paired with exhalation 155m Bridge: 10x  12/31/21: seated sciatic nerve glide   ASSESSMENT:  CLINICAL IMPRESSION: Session focus with balance activities to improve core, proximal and postural strengthening.  Added wall arch, pallof in partial tandem stance and sidestepping.  Pt reports increased Lt LE posterior radicular symptoms ending at knee with static tandem stance.  Educated seated nerve glide with reports of decreased symptoms following.  EOS with POE with MHP with reports of pain reduced and ability to  stand with improved posture.  Reviewed importance of posture for pain control.  Requested pt adjust mirror prior driving to promote improved posture following session.      OBJECTIVE IMPAIRMENTS Abnormal gait, decreased activity tolerance, decreased endurance, decreased mobility, decreased ROM, decreased strength, increased fascial restrictions, impaired flexibility, improper body mechanics, postural dysfunction, and pain.   ACTIVITY LIMITATIONS carrying, bending, sitting, standing, and squatting  PARTICIPATION LIMITATIONS: cleaning, laundry, shopping, community activity, and yard work  PERSONAL FACTORS 3+ comorbidities:     Angina, Anxiety or Panic Disorders, Arthritis, Back pain, Congestive Heart Failure or Heart Disease, Depression, Gastrointestinal Disease, Hearing Impairment, High Blood Pressure, Osteoporosis, Prior Surgery, Sleep dysfunction, Stroke or TIA are also affecting patient's functional outcome.   REHAB POTENTIAL: Good  CLINICAL DECISION MAKING: Stable/uncomplicated  EVALUATION COMPLEXITY: Moderate   GOALS: Goals reviewed with patient? Yes  SHORT TERM GOALS: Target date: 12/23/2021  Patient will be independent with HEP in order to improve functional outcomes. Baseline:  Goal status: IN PROGRESS  2.  Patient will report at least 25% improvement in symptoms for improved quality of life. Baseline:  Goal status: IN PROGRESS  3.  Patient will improve five time sit to stand by 10 seconds to improve efficiency in transfers.  Baseline: 5 times sit to stand: 37.07 seconds  Goal status: IN PROGRESS  LONG TERM GOALS: Target date: 01/20/2022  Patient will report at least 75% improvement in symptoms for improved quality of life. Baseline:  Goal status: IN PROGRESS  2.  Patient will improve FOTO score by at least 5 points in order to indicate improved tolerance to activity. Baseline: 42 Goal status: IN PROGRESS  3.  Patient will report average back pain of no greater  than 5/10 when completing household chores to improve quality of life.  Baseline: 8/10 Goal status: IN PROGRESS  4.  Patient will be able to ambulate at least 100 feet in 2MWT in order to demonstrate improved tolerance to activity. Baseline: 2 minute walk test: 280' Goal status: IN PROGRESS     PLAN: PT FREQUENCY: 2x/week  PT DURATION: other: 7 weeks  PLANNED INTERVENTIONS: PLANNED INTERVENTIONS: Therapeutic exercises, Therapeutic activity, Neuromuscular re-education, Balance training, Gait training, Patient/Family education, Joint manipulation, Joint mobilization, Stair training, Orthotic/Fit training, DME instructions, Aquatic Therapy, Dry Needling, Electrical stimulation, Spinal manipulation, Spinal mobilization, Cryotherapy, Moist heat, Compression bandaging, scar mobilization, Splintting, Taping, Traction, Ultrasound, Ionotophoresis '4mg'$ /ml Dexamethasone, and Manual therapy   PLAN FOR NEXT SESSION:  Continue balance,  stretching/mobility and core strength   CaIhor AustinLPTA/CLT; CBIS [id number] AM, 12/31/21

## 2022-01-02 ENCOUNTER — Encounter (HOSPITAL_COMMUNITY): Payer: Self-pay

## 2022-01-02 ENCOUNTER — Ambulatory Visit (HOSPITAL_COMMUNITY): Payer: No Typology Code available for payment source

## 2022-01-02 DIAGNOSIS — M5459 Other low back pain: Secondary | ICD-10-CM | POA: Diagnosis not present

## 2022-01-02 DIAGNOSIS — R2689 Other abnormalities of gait and mobility: Secondary | ICD-10-CM

## 2022-01-02 DIAGNOSIS — R296 Repeated falls: Secondary | ICD-10-CM

## 2022-01-02 NOTE — Therapy (Signed)
OUTPATIENT PHYSICAL THERAPY THORACOLUMBAR TREATMENT   Patient Name: Andrew Bates MRN: [id number] DOB:04/18/1946, 76 y.o., male Today's Date: 01/02/2022   PT End of Session - 01/02/22 1010     Visit Number 7    Number of Visits 15    Date for PT Re-Evaluation 01/20/22    [facility]ization Type [facility] (approved 15 visits)    Progress Note Due on Visit 15    PT Start Time 1011    PT Stop Time 1056    PT Time Calculation (min) 45 min    Activity Tolerance Patient tolerated treatment well;Patient limited by pain;No increased pain    Behavior During Therapy [facility] for tasks assessed/performed               Past Medical History:  Diagnosis Date   Adenomatous colon polyp    Anxiety    Arthritis    Asthma    Coronary artery disease    Nonobstructive at cardiac catheterization 2020   Depression    DVT (deep venous thrombosis) ([place]) 2008   Essential hypertension    GERD (gastroesophageal reflux disease)    History of kidney stones    Hyperlipidemia    Internal hemorrhoids    Peripheral neuropathy    PTSD (post-traumatic stress disorder)    Sleep apnea    cpap   Vertigo    Past Surgical History:  Procedure Laterality Date   ANTERIOR CERVICAL DECOMP/DISCECTOMY FUSION N/A 07/09/2015   Procedure: ANTERIOR CERVICAL DECOMPRESSION/DISCECTOMY FUSION CERVICAL FIVE-SIX,CERVICAL SIX-SEVEN;  Surgeon: Newman Pies, MD;  Location: [place] [facility];  Service: Neurosurgery;  Laterality: N/A;   CARPAL TUNNEL RELEASE Bilateral 2014   COLONOSCOPY     CYSTOSCOPY/RETROGRADE/URETEROSCOPY/STONE EXTRACTION WITH BASKET Left 02/14/2014   Procedure: CYSTOSCOPY AND LEFT DOUBLE J STENT PLACEMENT;  ATTEMPTED STONE  EXTRACTION ;  Surgeon: Jorja Loa, MD;  Location: [facility];  Service: Urology;  Laterality: Left;   ENDOLYMPHATIC SAC DECOMPRESSION Left 06/14/2019   Procedure: [facility] DECOMPRESSION;  Surgeon: Leta Baptist, MD;  Location: [place];  Service: ENT;   Laterality: Left;   ESOPHAGOGASTRODUODENOSCOPY ENDOSCOPY     INTRAVASCULAR PRESSURE WIRE/FFR STUDY N/A 06/25/2021   Procedure: INTRAVASCULAR PRESSURE WIRE/FFR STUDY;  Surgeon: Leonie Man, MD;  Location: [place] [facility];  Service: Cardiovascular;  Laterality: N/A;   KNEE SURGERY Right 2006   Arthroscopy - MCl tear   LEFT HEART CATH AND CORONARY ANGIOGRAPHY N/A 08/17/2018   Procedure: LEFT HEART CATH AND CORONARY ANGIOGRAPHY;  Surgeon: Burnell Blanks, MD;  Location: [place] [facility];  Service: Cardiovascular;  Laterality: N/A;   LEFT HEART CATH AND CORONARY ANGIOGRAPHY N/A 06/25/2021   Procedure: LEFT HEART CATH AND CORONARY ANGIOGRAPHY;  Surgeon: Leonie Man, MD;  Location: [place] [facility];  Service: Cardiovascular;  Laterality: N/A;   UPPER GASTROINTESTINAL ENDOSCOPY     Patient Active Problem List   Diagnosis Date Noted   Stage 3b chronic kidney disease (CKD) ([place]) 06/24/2021   PTSD (post-traumatic stress disorder)    Coronary artery disease, non-occlusive-by cardiac catheterization in 2020 01/10/2019   Unstable angina ([place]) 08/17/2018   Chest pain    Cervical spondylosis with radiculopathy 07/09/2015   Urinary frequency 02/25/2015   Dehydration 02/25/2015   Vertigo 02/25/2015   Hydronephrosis 02/15/2014   AKI (acute kidney injury) ([place]) 02/15/2014   Calculus of ureter 02/14/2014   Urinary hesitancy 11/17/2013   Overweight(278.02) 11/17/2013   Kidney stones 11/16/2013   Acute onset of severe  vertigo 11/16/2013   Sepsis secondary to UTI ([place]) 11/16/2013   Essential hypertension 11/16/2013   Hyperlipidemia with target LDL less than 70 11/16/2013   GERD (gastroesophageal reflux disease) 11/16/2013    PCP: Sharilyn Sites MD  REFERRING PROVIDER: Newman Pies MD Next apt: August 18th 2023  REFERRING DIAG: M54.42 Chronic Bilateral LBP with left-sided sciatica   Rationale for Evaluation and Treatment Rehabilitation  THERAPY DIAG:  Other low back  pain  Other abnormalities of gait and mobility  Repeated falls  ONSET DATE:   SUBJECTIVE:                                                                                                                                                                                           SUBJECTIVE STATEMENT: Pt stated he is feeling good today.  Arrived with wife today.  Patient reports pain is in center of back. Last time pain was radiating last night but has none today.   PAIN:  Are you having pain? Yes: NPRS scale: 2/10 Pain location: Lt side of lower back Pain description: sore, achey Aggravating factors: walking  Relieving factors: massage  Radicular: Lt LE posterior to knee  PERTINENT HISTORY:  Imaging report: Severe lumbar spondylosis multilevel severe facet arthrosis Multi level severe disc height loss Sacrum and SI joints normal  Chronic Bilateral LBP with left-sided sciatica  unstable angina 06/25/2021  Neck surgery 6 years ago Right knee, meniscus surgery 2006 Left rotator cuff repair 2021 Hard of hearing  Heart catheterization jan 2023     PRECAUTIONS: None  WEIGHT BEARING RESTRICTIONS No  FALLS:  Has patient fallen in last 6 months? Yes. Number of falls 10 patients foot caught both levels of patio and fell on right elbow. Pt correlates falls to uneven grass/log/root areas in backyard. Patient feels like he isnt picking up his feet high enough.   LIVING ENVIRONMENT: Lives with: lives with their spouse Lives in: House/apartment Stairs: Yes: External: 4-10 depending on location  steps; on right going up Has following equipment at home: None  OCCUPATION: retired   PLOF: Independent with basic ADLs and wall/furniture cruising for past 6-8 months. 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    PATIENT GOALS improve back strength, decrease pain, being able to stay upright.    OBJECTIVE:   DIAGNOSTIC FINDINGS:  Imaging  report: Severe lumbar spondylosis multilevel severe facet arthrosis Multi level severe disc height loss Sacrum and SI joints normal  PATIENT SURVEYS:  FOTO 42  SCREENING FOR RED FLAGS: Bowel or bladder incontinence: No Spinal tumors: No  Cauda equina syndrome: No Compression fracture: No Abdominal aneurysm: No  COGNITION:  Overall cognitive status: Within functional limits for tasks assessed     SENSATION: WFL   POSTURE: rounded shoulders, decreased lumbar lordosis, and decreased thoracic kyphosis  PALPATION: TTP around left QL, left psis  Decrease in symptoms of radiation with prone extensions but reports increasing pain centrally in low back.   LUMBAR ROM:   Active  A/PROM  eval  Flexion 6 cm   Extension 2cm  Right lateral flexion 8 degrees (causes pain on left side)  Left lateral flexion 14 degrees  Right rotation   Left rotation    (Blank rows = not tested)  Flexion and extension measured using bisecting lines from PSIS, initial point 15 cm above.    LOWER EXTREMITY MMT:    MMT Right eval Left eval  Hip flexion 5 5  Hip extension 4 single leg bridge 4 single leg bridge  Hip abduction 4 4  Hip adduction 4 4  Hip internal rotation    Hip external rotation    Knee flexion 5 5  Knee extension 5 5  Ankle dorsiflexion 5 5  Ankle plantarflexion    Ankle inversion 5 5  Ankle eversion 5 5   (Blank rows = not tested)   FUNCTIONAL TESTS:  5 times sit to stand: 37.07 seconds 2 minute walk test: 280'  GAIT: Distance walked:280' Assistive device utilized: None Level of assistance: Complete Independence Comments: decreased cadence, inc knee flexion in stance,decreased trunk stability laterally     TODAY'S TREATMENT  01/02/22 POE x1 min Prone press ups 2x10 Childs pose 10 sec x2  Cat camel x15 with manual and verbal cues Seated figure 4 stretch x15 sec x 4 b/l Attempted pelvis scoot in long sit Lateral reaches/rollout/ with physioball  STM to  left QL and lumber pine, illiac crest Low thoracic/ high Lumbar grade 2 mobilizations  12/31/21: Wall arch with heel raise 10x 3" Controlled sit to stand with erect posture and eccentric control Tandem stance 3x 30" (3rd set partial tandem with pallof GTB 4x 10 Sidestep 2RT RTB around thigh  Seated: nerve glide 3x 10  Prone: POE 3 min with MHP x 5 min at EOS  12/26/21 POE 3 min     Ab march x15     Bridge x15    Sidelying leg raise x10 each     LTR 10 x 5"   Standing:   Heel raise x 10    Tandam stance x30" each    Trigger Point Dry-Needling  Treatment instructions: Expect mild to moderate muscle soreness. S/S of pneumothorax if dry needled over a lung field, and to seek immediate medical attention should they occur. Patient verbalized understanding of these instructions and education.  Patient Consent Given: Yes Education handout provided: Yes Muscles treated: lower lumbar paraspinals  Electrical stimulation performed: No Parameters: N/A Treatment response/outcome: twitch response elicited, decrease in symptoms following.    Manual: STM to lower lumbar paraspinals before DN for trigger point identification, during to assess response to treatment, and following DN    12/17/21 Ab brace 10 x 5" Ab march x15 SLR with ab brace x10  Bridge 2 x 10 (increased pain in LT posterior thigh)  POE 3 min (abolished pain in LT thigh)  Sidelying hip abduction 2 x 10     Trigger Point Dry-Needling  Treatment instructions: Expect mild to moderate muscle soreness. S/S of pneumothorax if dry needled over a lung field, and to  seek immediate medical attention should they occur. Patient verbalized understanding of these instructions and education.  Patient Consent Given: Yes Education handout provided: Yes Muscles treated: lower lumbar paraspinals  Electrical stimulation performed: No Parameters: N/A Treatment response/outcome: twitch response elicited, decrease in symptoms following.     Manual: STM to lower lumbar paraspinals before DN for trigger point identification, during to assess response to treatment, and following DN    12/11/21 Prone lying 5 minutes  Trigger Point Dry-Needling  Treatment instructions: Expect mild to moderate muscle soreness. S/S of pneumothorax if dry needled over a lung field, and to seek immediate medical attention should they occur. Patient verbalized understanding of these instructions and education.  Patient Consent Given: Yes Education handout provided: Yes Muscles treated: lower lumbar paraspinals  Electrical stimulation performed: No Parameters: N/A Treatment response/outcome: twitch response elicited, decrease in symptoms following.    Manual: STM to lower lumbar paraspinals before DN for trigger point identification, during to assess response to treatment, and following DN    Prone heel squeeze 10 x 5 second holds Prone hip extension 1x 10 bilateral     12/10/21:  reviewed goals, educated importance of HEP compliance for maximal benefits  Educated importance of posture for pain control Standing: 3D hip excursion (lateral weight shift, rotate, controlled STS, extend) 10x Prone: POE x 2 min with 2 pillows under hips, spreads out back pain from central to sides, resolved radicular symptoms Supine: Diaphragmatic breathing 63mn  Isometric abdominal sets paired with exhalation 192m  Bent knee raise 10x 5" with ab set  Bridge 10x- c/o aggravation to lower back upon lowering, relief in the air  evaluation   PATIENT EDUCATION:  Education details:  6/21 centralization of symptoms, HEP, DN 12/10/21: Reviewed goals, educated importance of HEP compliance for maximal benefits and importance of posture.  Eval: Patient educated on exam findings, POC, scope of PT Person educated: Patient Education method: Explanation, Demonstration Education comprehension: verbalized understanding   HOME EXERCISE PROGRAM: 12/17/21 Access Code:  [email or web address]: [email or web address] Date: 12/17/2021 Prepared by: CaJosue HectorExercises - Sidelying Hip Abduction  - 2 x daily - 7 x weekly - 2 sets - 10 reps 12/10/21: Prone on elbow with 2 pillow under hips Diaphragmatic breathing 80m54mIsometric abdominal sets paired with exhalation 80mi45mridge: 10x  12/31/21: seated sciatic nerve glide   ASSESSMENT:  CLINICAL IMPRESSION: Pt tolerates session well. Able to complete all activities and states that back feels looser but there is more soreness. Describes at beginning of session as an actual pain at 2/10 but post session less pain more soreness at 5/10. Patient with large left QL spasm. Educated patient to try and stretch and apply moist heat post working on home instead of just sitting in recliner. Patient with no questions at this time. Patient will continue to benefit from skilled PT session to improve mobility and reduce pain levels.    OBJECTIVE IMPAIRMENTS Abnormal gait, decreased activity tolerance, decreased endurance, decreased mobility, decreased ROM, decreased strength, increased fascial restrictions, impaired flexibility, improper body mechanics, postural dysfunction, and pain.   ACTIVITY LIMITATIONS carrying, bending, sitting, standing, and squatting  PARTICIPATION LIMITATIONS: cleaning, laundry, shopping, community activity, and yard work  PERSONAL FACTORS 3+ comorbidities:     Angina, Anxiety or Panic Disorders, Arthritis, Back pain, Congestive Heart Failure or Heart Disease, Depression, Gastrointestinal Disease, Hearing Impairment, High Blood Pressure, Osteoporosis, Prior Surgery, Sleep dysfunction, Stroke or TIA are also affecting patient's functional outcome.   REHAB POTENTIAL: Good  CLINICAL DECISION MAKING:  Stable/uncomplicated  EVALUATION COMPLEXITY: Moderate   GOALS: Goals reviewed with patient? Yes  SHORT TERM GOALS: Target date: 12/23/2021  Patient will be independent with HEP in order  to improve functional outcomes. Baseline:  Goal status: IN PROGRESS  2.  Patient will report at least 25% improvement in symptoms for improved quality of life. Baseline:  Goal status: IN PROGRESS  3.  Patient will improve five time sit to stand by 10 seconds to improve efficiency in transfers.  Baseline: 5 times sit to stand: 37.07 seconds  Goal status: IN PROGRESS  LONG TERM GOALS: Target date: 01/20/2022  Patient will report at least 75% improvement in symptoms for improved quality of life. Baseline:  Goal status: IN PROGRESS  2.  Patient will improve FOTO score by at least 5 points in order to indicate improved tolerance to activity. Baseline: 42 Goal status: IN PROGRESS  3.  Patient will report average back pain of no greater than 5/10 when completing household chores to improve quality of life.  Baseline: 8/10 Goal status: IN PROGRESS  4.  Patient will be able to ambulate at least 100 feet in 2MWT in order to demonstrate improved tolerance to activity. Baseline: 2 minute walk test: 280' Goal status: IN PROGRESS     PLAN: PT FREQUENCY: 2x/week  PT DURATION: other: 7 weeks  PLANNED INTERVENTIONS: PLANNED INTERVENTIONS: Therapeutic exercises, Therapeutic activity, Neuromuscular re-education, Balance training, Gait training, Patient/Family education, Joint manipulation, Joint mobilization, Stair training, Orthotic/Fit training, DME instructions, Aquatic Therapy, Dry Needling, Electrical stimulation, Spinal manipulation, Spinal mobilization, Cryotherapy, Moist heat, Compression bandaging, scar mobilization, Splintting, Taping, Traction, Ultrasound, Ionotophoresis '4mg'$ /ml Dexamethasone, and Manual therapy   PLAN FOR NEXT SESSION:  Continue balance,  stretching/mobility and core strength   Zachrey Deutscher PT, DPT   12:21 PM, 01/02/22

## 2022-01-07 ENCOUNTER — Encounter (HOSPITAL_COMMUNITY): Payer: Self-pay

## 2022-01-07 ENCOUNTER — Ambulatory Visit (HOSPITAL_COMMUNITY): Payer: No Typology Code available for payment source

## 2022-01-07 DIAGNOSIS — M5459 Other low back pain: Secondary | ICD-10-CM

## 2022-01-07 DIAGNOSIS — R296 Repeated falls: Secondary | ICD-10-CM

## 2022-01-07 DIAGNOSIS — R2689 Other abnormalities of gait and mobility: Secondary | ICD-10-CM

## 2022-01-07 NOTE — Therapy (Signed)
OUTPATIENT PHYSICAL THERAPY THORACOLUMBAR TREATMENT   Patient Name: Andrew Bates MRN: [id number] DOB:04-07-1946, 76 y.o., male Today's Date: 01/07/2022   PT End of Session - 01/07/22 1121     Visit Number 8    Number of Visits 15    Date for PT Re-Evaluation 01/20/22    [facility] Type [facility] (approved 15 visits)    Progress Note Due on Visit 15    PT Start Time 1035    PT Stop Time 1120    PT Time Calculation (min) 45 min    Activity Tolerance Patient tolerated treatment well;Patient limited by pain;No increased pain    Behavior During Therapy [facility] for tasks assessed/performed                Past Medical History:  Diagnosis Date   Adenomatous colon polyp    Anxiety    Arthritis    Asthma    Coronary artery disease    Nonobstructive at cardiac catheterization 2020   Depression    DVT (deep venous thrombosis) ([place]) 2008   Essential hypertension    GERD (gastroesophageal reflux disease)    History of kidney stones    Hyperlipidemia    Internal hemorrhoids    Peripheral neuropathy    PTSD (post-traumatic stress disorder)    Sleep apnea    cpap   Vertigo    Past Surgical History:  Procedure Laterality Date   ANTERIOR CERVICAL DECOMP/DISCECTOMY FUSION N/A 07/09/2015   Procedure: ANTERIOR CERVICAL DECOMPRESSION/DISCECTOMY FUSION CERVICAL FIVE-SIX,CERVICAL SIX-SEVEN;  Surgeon: Newman Pies, MD;  Location: [place] [facility];  Service: Neurosurgery;  Laterality: N/A;   CARPAL TUNNEL RELEASE Bilateral 2014   COLONOSCOPY     CYSTOSCOPY/RETROGRADE/URETEROSCOPY/STONE EXTRACTION WITH BASKET Left 02/14/2014   Procedure: CYSTOSCOPY AND LEFT DOUBLE J STENT PLACEMENT;  ATTEMPTED STONE  EXTRACTION ;  Surgeon: Jorja Loa, MD;  Location: [facility];  Service: Urology;  Laterality: Left;   ENDOLYMPHATIC SAC DECOMPRESSION Left 06/14/2019   Procedure: [facility] DECOMPRESSION;  Surgeon: Leta Baptist, MD;  Location: [place];  Service: ENT;   Laterality: Left;   ESOPHAGOGASTRODUODENOSCOPY ENDOSCOPY     INTRAVASCULAR PRESSURE WIRE/FFR STUDY N/A 06/25/2021   Procedure: INTRAVASCULAR PRESSURE WIRE/FFR STUDY;  Surgeon: Leonie Man, MD;  Location: [place] [facility];  Service: Cardiovascular;  Laterality: N/A;   KNEE SURGERY Right 2006   Arthroscopy - MCl tear   LEFT HEART CATH AND CORONARY ANGIOGRAPHY N/A 08/17/2018   Procedure: LEFT HEART CATH AND CORONARY ANGIOGRAPHY;  Surgeon: Burnell Blanks, MD;  Location: [place] [facility];  Service: Cardiovascular;  Laterality: N/A;   LEFT HEART CATH AND CORONARY ANGIOGRAPHY N/A 06/25/2021   Procedure: LEFT HEART CATH AND CORONARY ANGIOGRAPHY;  Surgeon: Leonie Man, MD;  Location: [place] [facility];  Service: Cardiovascular;  Laterality: N/A;   UPPER GASTROINTESTINAL ENDOSCOPY     Patient Active Problem List   Diagnosis Date Noted   Stage 3b chronic kidney disease (CKD) ([place]) 06/24/2021   PTSD (post-traumatic stress disorder)    Coronary artery disease, non-occlusive-by cardiac catheterization in 2020 01/10/2019   Unstable angina ([place]) 08/17/2018   Chest pain    Cervical spondylosis with radiculopathy 07/09/2015   Urinary frequency 02/25/2015   Dehydration 02/25/2015   Vertigo 02/25/2015   Hydronephrosis 02/15/2014   AKI (acute kidney injury) ([place]) 02/15/2014   Calculus of ureter 02/14/2014   Urinary hesitancy 11/17/2013   Overweight(278.02) 11/17/2013   Kidney stones 11/16/2013   Acute onset of  severe vertigo 11/16/2013   Sepsis secondary to UTI ([place]) 11/16/2013   Essential hypertension 11/16/2013   Hyperlipidemia with target LDL less than 70 11/16/2013   GERD (gastroesophageal reflux disease) 11/16/2013    PCP: Sharilyn Sites MD  REFERRING PROVIDER: Newman Pies MD Next apt: August 18th 2023  REFERRING DIAG: M54.42 Chronic Bilateral LBP with left-sided sciatica   Rationale for Evaluation and Treatment Rehabilitation  THERAPY DIAG:  Other low back  pain  Other abnormalities of gait and mobility  Repeated falls  ONSET DATE:   SUBJECTIVE:                                                                                                                                                                                           SUBJECTIVE STATEMENT: Pt stated he is feeling good today, pain minimal today with no reports of radicular symptoms.  Has been working on home projects, a lot of overhead work.  PAIN:  Are you having pain? Yes: NPRS scale: 2/10 Pain location: Lt side of lower back Pain description: sore, achey Aggravating factors: walking  Relieving factors: massage  Radicular: No radicular symptoms on 01/07/22.  Eval:  Lt LE posterior to knee  PERTINENT HISTORY:  Imaging report: Severe lumbar spondylosis multilevel severe facet arthrosis Multi level severe disc height loss Sacrum and SI joints normal  Chronic Bilateral LBP with left-sided sciatica  unstable angina 06/25/2021  Neck surgery 6 years ago Right knee, meniscus surgery 2006 Left rotator cuff repair 2021 Hard of hearing  Heart catheterization jan 2023     PRECAUTIONS: None  WEIGHT BEARING RESTRICTIONS No  FALLS:  Has patient fallen in last 6 months? Yes. Number of falls 10 patients foot caught both levels of patio and fell on right elbow. Pt correlates falls to uneven grass/log/root areas in backyard. Patient feels like he isnt picking up his feet high enough.   LIVING ENVIRONMENT: Lives with: lives with their spouse Lives in: House/apartment Stairs: Yes: External: 4-10 depending on location  steps; on right going up Has following equipment at home: None  OCCUPATION: retired   PLOF: Independent with basic ADLs and wall/furniture cruising for past 6-8 months. 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    PATIENT GOALS improve back strength, decrease pain, being able to stay upright.    OBJECTIVE:    DIAGNOSTIC FINDINGS:  Imaging report: Severe lumbar spondylosis multilevel severe facet arthrosis Multi level severe disc height loss Sacrum and SI joints normal  PATIENT SURVEYS:  FOTO 42  SCREENING FOR RED FLAGS: Bowel or bladder incontinence:  No Spinal tumors: No Cauda equina syndrome: No Compression fracture: No Abdominal aneurysm: No  COGNITION:  Overall cognitive status: Within functional limits for tasks assessed     SENSATION: WFL   POSTURE: rounded shoulders, decreased lumbar lordosis, and decreased thoracic kyphosis  PALPATION: TTP around left QL, left psis  Decrease in symptoms of radiation with prone extensions but reports increasing pain centrally in low back.   LUMBAR ROM:   Active  A/PROM  eval  Flexion 6 cm   Extension 2cm  Right lateral flexion 8 degrees (causes pain on left side)  Left lateral flexion 14 degrees  Right rotation   Left rotation    (Blank rows = not tested)  Flexion and extension measured using bisecting lines from PSIS, initial point 15 cm above.    LOWER EXTREMITY MMT:    MMT Right eval Left eval  Hip flexion 5 5  Hip extension 4 single leg bridge 4 single leg bridge  Hip abduction 4 4  Hip adduction 4 4  Hip internal rotation    Hip external rotation    Knee flexion 5 5  Knee extension 5 5  Ankle dorsiflexion 5 5  Ankle plantarflexion    Ankle inversion 5 5  Ankle eversion 5 5   (Blank rows = not tested)   FUNCTIONAL TESTS:  5 times sit to stand: 37.07 seconds 2 minute walk test: 280'  GAIT: Distance walked:280' Assistive device utilized: None Level of assistance: Complete Independence Comments: decreased cadence, inc knee flexion in stance,decreased trunk stability laterally     TODAY'S TREATMENT  01/07/22: POE x 2 min Prone press ups 2x10 Quadruped cat/camel 5x 10" with manual and verbal cues Child's pose 3x 30" STM prone to lumbar paraspinal, QL, iliac crest, PSIS, sacrum Seated QL stretch 3x  30"- HEP  Standing:  Vector stance 3x 5"  01/02/22 POE x1 min Prone press ups 2x10 Childs pose 10 sec x2  Cat camel x15 with manual and verbal cues Seated figure 4 stretch x15 sec x 4 b/l Attempted pelvis scoot in long sit Lateral reaches/rollout/ with physioball  STM to left QL and lumber pine, illiac crest Low thoracic/ high Lumbar grade 2 mobilizations  12/31/21: Wall arch with heel raise 10x 3" Controlled sit to stand with erect posture and eccentric control Tandem stance 3x 30" (3rd set partial tandem with pallof GTB 4x 10 Sidestep 2RT RTB around thigh  Seated: nerve glide 3x 10  Prone: POE 3 min with MHP x 5 min at EOS  12/26/21 POE 3 min     Ab march x15     Bridge x15    Sidelying leg raise x10 each     LTR 10 x 5"   Standing:   Heel raise x 10    Tandam stance x30" each    Trigger Point Dry-Needling  Treatment instructions: Expect mild to moderate muscle soreness. S/S of pneumothorax if dry needled over a lung field, and to seek immediate medical attention should they occur. Patient verbalized understanding of these instructions and education.  Patient Consent Given: Yes Education handout provided: Yes Muscles treated: lower lumbar paraspinals  Electrical stimulation performed: No Parameters: N/A Treatment response/outcome: twitch response elicited, decrease in symptoms following.    Manual: STM to lower lumbar paraspinals before DN for trigger point identification, during to assess response to treatment, and following DN    12/17/21 Ab brace 10 x 5" Ab march x15 SLR with ab brace x10  Bridge 2 x 10 (  increased pain in LT posterior thigh)  POE 3 min (abolished pain in LT thigh)  Sidelying hip abduction 2 x 10     Trigger Point Dry-Needling  Treatment instructions: Expect mild to moderate muscle soreness. S/S of pneumothorax if dry needled over a lung field, and to seek immediate medical attention should they occur. Patient verbalized understanding of these  instructions and education.  Patient Consent Given: Yes Education handout provided: Yes Muscles treated: lower lumbar paraspinals  Electrical stimulation performed: No Parameters: N/A Treatment response/outcome: twitch response elicited, decrease in symptoms following.    Manual: STM to lower lumbar paraspinals before DN for trigger point identification, during to assess response to treatment, and following DN    12/11/21 Prone lying 5 minutes  Trigger Point Dry-Needling  Treatment instructions: Expect mild to moderate muscle soreness. S/S of pneumothorax if dry needled over a lung field, and to seek immediate medical attention should they occur. Patient verbalized understanding of these instructions and education.  Patient Consent Given: Yes Education handout provided: Yes Muscles treated: lower lumbar paraspinals  Electrical stimulation performed: No Parameters: N/A Treatment response/outcome: twitch response elicited, decrease in symptoms following.    Manual: STM to lower lumbar paraspinals before DN for trigger point identification, during to assess response to treatment, and following DN    Prone heel squeeze 10 x 5 second holds Prone hip extension 1x 10 bilateral     12/10/21:  reviewed goals, educated importance of HEP compliance for maximal benefits  Educated importance of posture for pain control Standing: 3D hip excursion (lateral weight shift, rotate, controlled STS, extend) 10x Prone: POE x 2 min with 2 pillows under hips, spreads out back pain from central to sides, resolved radicular symptoms Supine: Diaphragmatic breathing 54mn  Isometric abdominal sets paired with exhalation 194m  Bent knee raise 10x 5" with ab set  Bridge 10x- c/o aggravation to lower back upon lowering, relief in the air  evaluation   PATIENT EDUCATION:  Education details:  6/21 centralization of symptoms, HEP, DN 12/10/21: Reviewed goals, educated importance of HEP compliance for  maximal benefits and importance of posture.  Eval: Patient educated on exam findings, POC, scope of PT Person educated: Patient Education method: Explanation, Demonstration Education comprehension: verbalized understanding   HOME EXERCISE PROGRAM: 12/17/21 Access Code: [email or web address]: [email or web address] Date: 12/17/2021 Prepared by: CaJosue HectorExercises - Sidelying Hip Abduction  - 2 x daily - 7 x weekly - 2 sets - 10 reps 12/10/21: Prone on elbow with 2 pillow under hips Diaphragmatic breathing 17m65mIsometric abdominal sets paired with exhalation 17mi84mridge: 10x  12/31/21: seated sciatic nerve glide   ASSESSMENT:  CLINICAL IMPRESSION: Session focus with mobility and additional balance activities.  Pt presents with restricted spinal motion and required tactile and verbal cueing to improve motion.  Manual STM complete to address Lt QL spasm, reports of relief during session then reports of increased tightness and increased pain following.  Instructed QL stretch.  Added vector stance for hip stability for strengthening.  No reports of radicular symptoms through session.  Pt reports benefits with dry needling, encouraged to get apt with therapist that provides.  OBJECTIVE IMPAIRMENTS Abnormal gait, decreased activity tolerance, decreased endurance, decreased mobility, decreased ROM, decreased strength, increased fascial restrictions, impaired flexibility, improper body mechanics, postural dysfunction, and pain.   ACTIVITY LIMITATIONS carrying, bending, sitting, standing, and squatting  PARTICIPATION LIMITATIONS: cleaning, laundry, shopping, community activity, and yard work  PERSONAL FACTORS 3+ comorbidities:     Angina, Anxiety or  Panic Disorders, Arthritis, Back pain, Congestive Heart Failure or Heart Disease, Depression, Gastrointestinal Disease, Hearing Impairment, High Blood Pressure, Osteoporosis, Prior Surgery, Sleep dysfunction, Stroke or TIA are also  affecting patient's functional outcome.   REHAB POTENTIAL: Good  CLINICAL DECISION MAKING: Stable/uncomplicated  EVALUATION COMPLEXITY: Moderate   GOALS: Goals reviewed with patient? Yes  SHORT TERM GOALS: Target date: 12/23/2021  Patient will be independent with HEP in order to improve functional outcomes. Baseline:  Goal status: IN PROGRESS  2.  Patient will report at least 25% improvement in symptoms for improved quality of life. Baseline:  Goal status: IN PROGRESS  3.  Patient will improve five time sit to stand by 10 seconds to improve efficiency in transfers.  Baseline: 5 times sit to stand: 37.07 seconds  Goal status: IN PROGRESS  LONG TERM GOALS: Target date: 01/20/2022  Patient will report at least 75% improvement in symptoms for improved quality of life. Baseline:  Goal status: IN PROGRESS  2.  Patient will improve FOTO score by at least 5 points in order to indicate improved tolerance to activity. Baseline: 42 Goal status: IN PROGRESS  3.  Patient will report average back pain of no greater than 5/10 when completing household chores to improve quality of life.  Baseline: 8/10 Goal status: IN PROGRESS  4.  Patient will be able to ambulate at least 100 feet in 2MWT in order to demonstrate improved tolerance to activity. Baseline: 2 minute walk test: 280' Goal status: IN PROGRESS     PLAN: PT FREQUENCY: 2x/week  PT DURATION: other: 7 weeks  PLANNED INTERVENTIONS: PLANNED INTERVENTIONS: Therapeutic exercises, Therapeutic activity, Neuromuscular re-education, Balance training, Gait training, Patient/Family education, Joint manipulation, Joint mobilization, Stair training, Orthotic/Fit training, DME instructions, Aquatic Therapy, Dry Needling, Electrical stimulation, Spinal manipulation, Spinal mobilization, Cryotherapy, Moist heat, Compression bandaging, scar mobilization, Splintting, Taping, Traction, Ultrasound, Ionotophoresis '4mg'$ /ml Dexamethasone, and Manual  therapy   PLAN FOR NEXT SESSION:  Continue balance,  stretching/mobility and core strength.  Pt reports relief with dry needling.  Ihor Austin, LPTA/CLT; CBIS [phone]  12:55 PM, 01/07/22

## 2022-01-09 ENCOUNTER — Encounter: Payer: Self-pay | Admitting: Internal Medicine

## 2022-01-09 ENCOUNTER — Ambulatory Visit (HOSPITAL_COMMUNITY): Payer: No Typology Code available for payment source | Admitting: Physical Therapy

## 2022-01-09 ENCOUNTER — Ambulatory Visit (INDEPENDENT_AMBULATORY_CARE_PROVIDER_SITE_OTHER): Payer: No Typology Code available for payment source | Admitting: Internal Medicine

## 2022-01-09 VITALS — BP 120/68 | HR 70 | Ht 74.0 in | Wt 214.1 lb

## 2022-01-09 DIAGNOSIS — K648 Other hemorrhoids: Secondary | ICD-10-CM | POA: Diagnosis not present

## 2022-01-09 DIAGNOSIS — K5900 Constipation, unspecified: Secondary | ICD-10-CM | POA: Diagnosis not present

## 2022-01-09 DIAGNOSIS — M5459 Other low back pain: Secondary | ICD-10-CM | POA: Diagnosis not present

## 2022-01-09 DIAGNOSIS — R195 Other fecal abnormalities: Secondary | ICD-10-CM

## 2022-01-09 DIAGNOSIS — R296 Repeated falls: Secondary | ICD-10-CM

## 2022-01-09 DIAGNOSIS — R2689 Other abnormalities of gait and mobility: Secondary | ICD-10-CM

## 2022-01-09 NOTE — Patient Instructions (Addendum)
If you are age 75 or older, your body mass index should be between 23-30. Your Body mass index is 27.49 kg/m. If this is out of the aforementioned range listed, please consider follow up with your Primary Care Provider.  If you are age 67 or younger, your body mass index should be between 19-25. Your Body mass index is 27.49 kg/m. If this is out of the aformentioned range listed, please consider follow up with your Primary Care Provider.   ________________________________________________________  The [facility] providers would like to encourage you to use [facility] to communicate with providers for non-urgent requests or questions.  Due to long hold times on the telephone, sending your provider a message by [facility] may be a faster and more efficient way to get a response.  Please allow 48 business hours for a response.  Please remember that this is for non-urgent requests.  _______________________________________________________   Dennis Bast have been scheduled for your 2nd hemorrhoid banding for 02/07/22 at 11am.   Continue your fiber.  Take a half dose of Miralax daily.   HEMORRHOID BANDING PROCEDURE    FOLLOW-UP CARE   The procedure you have had should have been relatively painless since the banding of the area involved does not have nerve endings and there is no pain sensation.  The rubber band cuts off the blood supply to the hemorrhoid and the band may fall off as soon as 48 hours after the banding (the band may occasionally be seen in the toilet bowl following a bowel movement). You may notice a temporary feeling of fullness in the rectum which should respond adequately to plain Tylenol or Motrin.  Following the banding, avoid strenuous exercise that evening and resume full activity the next day.  A sitz bath (soaking in a warm tub) or bidet is soothing, and can be useful for cleansing the area after bowel movements.     To avoid constipation, take two tablespoons of natural wheat bran,  natural oat bran, flax, Benefiber or any over the counter fiber supplement and increase your water intake to 7-8 glasses daily.    Unless you have been prescribed anorectal medication, do not put anything inside your rectum for two weeks: No suppositories, enemas, fingers, etc.  Occasionally, you may have more bleeding than usual after the banding procedure.  This is often from the untreated hemorrhoids rather than the treated one.  Don't be concerned if there is a tablespoon or so of blood.  If there is more blood than this, lie flat with your bottom higher than your head and apply an ice pack to the area. If the bleeding does not stop within a half an hour or if you feel faint, call our office at [phone] or go to the emergency room.  Problems are not common; however, if there is a substantial amount of bleeding, severe pain, chills, fever or difficulty passing urine (very rare) or other problems, you should call us at [phone] or report to the nearest emergency room.  Do not stay seated continuously for more than 2-3 hours for a day or two after the procedure.  Tighten your buttock muscles 10-15 times every two hours and take 10-15 deep breaths every 1-2 hours.  Do not spend more than a few minutes on the toilet if you cannot empty your bowel; instead re-visit the toilet at a later time.   Thank you for entrusting me with your care and choosing [facility].  Dr. Hilarie Fredrickson

## 2022-01-09 NOTE — Progress Notes (Signed)
   Subjective:    Patient ID: Andrew Bates, male    DOB: 1945-09-14, 76 y.o.   MRN: [id number]  HPI [name] is a 76 year old male with a history of symptomatic hemorrhoids, adenomatous colon polyps, GERD, hyperlipidemia, sleep apnea who is seen to evaluate recurrent symptomatic hemorrhoids.  He is here today with his wife.  He is seen by Dr. Henrene Pastor and was last seen on 11/29/2021.  At that time fiber was added to his diet.  He has been using 2 tablespoons daily of what sounds like Benefiber.  He has been dealing with 8 or 9 weeks of harder stool with more difficult defecation.  On 2 occasions in the last week he seen bright red blood per rectum overall small-volume.  He can go several days between bowel movement and is still having crampy lower abdominal pain and constipation.  He completed hemorrhoidal banding x3 in 2018 with great result at that time.  He is up-to-date with his colonoscopic surveillance.  Review of Systems As per HPI, otherwise negative  Current Medications, Allergies, Past Medical History, Past Surgical History, Family History and Social History were reviewed in [employer] record.    Objective:   Physical Exam BP 120/68   Pulse 70   Ht '6\' 2"'$  (1.88 m)   Wt 214 lb 2 oz (97.1 kg)   SpO2 96%   BMI 27.49 kg/m  Gen: awake, alert, NAD HEENT: anicteric Abd: soft, NT/ND, +BS throughout ANOSCOPY: Using a disposable, lubricated, slotted, self-illuminating anoscope, the rectum was intubated without difficulty. The trochar was removed and the ano-rectum was circumferentially inspected. There were internal hemorrhoids, RA=LL and minimal at RP. There was no finding of an anorectal fissure. The rectal mucosa was not inflamed. No neoplasia or other pathology was identified. The inspection was well tolerated.  Ext: no c/c/e Neuro: nonfocal      Assessment & Plan:   76 year old male with a history of symptomatic hemorrhoids, adenomatous colon polyps,  GERD, hyperlipidemia, sleep apnea who is seen to evaluate recurrent symptomatic hemorrhoids and ongoing constipation despite 6 weeks of daily fiber increase.  Prolapsing and bleeding internal hemorrhoids --successful banding in 2018 with recurrence.  Anoscopy performed as above revealing left lateral and right anterior predominant internal hemorrhoids.  We discussed repeat banding and he is interested.  PROCEDURE NOTE:  The patient presents with symptomatic grade 2-3 internal hemorrhoids, requesting rubber band ligation of his hemorrhoidal disease.  All risks, benefits and alternative forms of therapy were described and informed consent was obtained.   The anorectum was pre-medicated with 0.125% nitroglycerin ointment The decision was made to band the RA internal hemorrhoid, and the [place] was used to perform band ligation without complication.   Digital anorectal examination was then performed to assure proper positioning of the band, and to adjust the banded tissue as required.  The patient was discharged home without pain or other issues.  Dietary and behavioral recommendations were given and along with follow-up instructions.     The patient will return as scheduled for follow-up and possible additional banding as required. No complications were encountered and the patient tolerated the procedure well.  2.  Constipation and hard stool --continue the psyllium 2 tablespoons daily and add MiraLAX 8.5 to 17 grams daily.  Follow-up in about a month for additional banding and to reassess constipation and hard stool

## 2022-01-09 NOTE — Therapy (Signed)
OUTPATIENT PHYSICAL THERAPY THORACOLUMBAR TREATMENT   Patient Name: Andrew Bates MRN: [id number] DOB:May 28, 1946, 76 y.o., male Today's Date: 01/09/2022   PT End of Session - 01/09/22 1037     Visit Number 9    Number of Visits 15    Date for PT Re-Evaluation 01/20/22    Authorization Type [facility] (approved 15 visits)    Progress Note Due on Visit 15    PT Start Time 1035    PT Stop Time 1113    PT Time Calculation (min) 38 min    Activity Tolerance Patient tolerated treatment well    Behavior During Therapy WFL for tasks assessed/performed                Past Medical History:  Diagnosis Date   Adenomatous colon polyp    Anxiety    Arthritis    Asthma    Coronary artery disease    Nonobstructive at cardiac catheterization 2020   Depression    DVT (deep venous thrombosis) ([place]) 2008   Essential hypertension    GERD (gastroesophageal reflux disease)    History of kidney stones    Hyperlipidemia    Internal hemorrhoids    Peripheral neuropathy    PTSD (post-traumatic stress disorder)    Sleep apnea    cpap   Vertigo    Past Surgical History:  Procedure Laterality Date   ANTERIOR CERVICAL DECOMP/DISCECTOMY FUSION N/A 07/09/2015   Procedure: ANTERIOR CERVICAL DECOMPRESSION/DISCECTOMY FUSION CERVICAL FIVE-SIX,CERVICAL SIX-SEVEN;  Surgeon: Newman Pies, MD;  Location: [place] [facility];  Service: Neurosurgery;  Laterality: N/A;   CARPAL TUNNEL RELEASE Bilateral 2014   COLONOSCOPY     CYSTOSCOPY/RETROGRADE/URETEROSCOPY/STONE EXTRACTION WITH BASKET Left 02/14/2014   Procedure: CYSTOSCOPY AND LEFT DOUBLE J STENT PLACEMENT;  ATTEMPTED STONE  EXTRACTION ;  Surgeon: Jorja Loa, MD;  [facility]: [facility];  Service: Urology;  Laterality: Left;   ENDOLYMPHATIC SAC DECOMPRESSION Left 06/14/2019   Procedure: [facility] DECOMPRESSION;  Surgeon: Leta Baptist, MD;  Location: [place];  Service: ENT;  Laterality: Left;   ESOPHAGOGASTRODUODENOSCOPY  ENDOSCOPY     INTRAVASCULAR PRESSURE WIRE/FFR STUDY N/A 06/25/2021   Procedure: INTRAVASCULAR PRESSURE WIRE/FFR STUDY;  Surgeon: Leonie Man, MD;  Location: [place] [facility];  Service: Cardiovascular;  Laterality: N/A;   KNEE SURGERY Right 2006   Arthroscopy - MCl tear   LEFT HEART CATH AND CORONARY ANGIOGRAPHY N/A 08/17/2018   Procedure: LEFT HEART CATH AND CORONARY ANGIOGRAPHY;  Surgeon: Burnell Blanks, MD;  Location: [place] [facility];  Service: Cardiovascular;  Laterality: N/A;   LEFT HEART CATH AND CORONARY ANGIOGRAPHY N/A 06/25/2021   Procedure: LEFT HEART CATH AND CORONARY ANGIOGRAPHY;  Surgeon: Leonie Man, MD;  Location: [place] [facility];  Service: Cardiovascular;  Laterality: N/A;   UPPER GASTROINTESTINAL ENDOSCOPY     Patient Active Problem List   Diagnosis Date Noted   Stage 3b chronic kidney disease (CKD) ([place]) 06/24/2021   PTSD (post-traumatic stress disorder)    Coronary artery disease, non-occlusive-by cardiac catheterization in 2020 01/10/2019   Unstable angina ([place]) 08/17/2018   Chest pain    Cervical spondylosis with radiculopathy 07/09/2015   Urinary frequency 02/25/2015   Dehydration 02/25/2015   Vertigo 02/25/2015   Hydronephrosis 02/15/2014   AKI (acute kidney injury) ([place]) 02/15/2014   Calculus of ureter 02/14/2014   Urinary hesitancy 11/17/2013   Overweight(278.02) 11/17/2013   Kidney stones 11/16/2013   Acute onset of severe vertigo 11/16/2013  Sepsis secondary to UTI ([place]) 11/16/2013   Essential hypertension 11/16/2013   Hyperlipidemia with target LDL less than 70 11/16/2013   GERD (gastroesophageal reflux disease) 11/16/2013    PCP: Sharilyn Sites MD  REFERRING PROVIDER: Newman Pies MD Next apt: August 18th 2023  REFERRING DIAG: M54.42 Chronic Bilateral LBP with left-sided sciatica   Rationale for Evaluation and Treatment Rehabilitation  THERAPY DIAG:  Other low back pain  Other abnormalities of gait and  mobility  Repeated falls  ONSET DATE:   SUBJECTIVE:                                                                                                                                                                                           SUBJECTIVE STATEMENT: Patient states his back is bothering him more today since he woke up today. Not sure why. He reports compliance with HEP.   PAIN:  Are you having pain? Yes: NPRS scale: 2/10 Pain location: Lt side of lower back Pain description: sore, achey Aggravating factors: walking  Relieving factors: massage  Radicular: No radicular symptoms on 01/07/22.  Eval:  Lt LE posterior to knee  PERTINENT HISTORY:  Imaging report: Severe lumbar spondylosis multilevel severe facet arthrosis Multi level severe disc height loss Sacrum and SI joints normal  Chronic Bilateral LBP with left-sided sciatica  unstable angina 06/25/2021  Neck surgery 6 years ago Right knee, meniscus surgery 2006 Left rotator cuff repair 2021 Hard of hearing  Heart catheterization jan 2023     PRECAUTIONS: None  WEIGHT BEARING RESTRICTIONS No  FALLS:  Has patient fallen in last 6 months? Yes. Number of falls 10 patients foot caught both levels of patio and fell on right elbow. Pt correlates falls to uneven grass/log/root areas in backyard. Patient feels like he isnt picking up his feet high enough.   LIVING ENVIRONMENT: Lives with: lives with their spouse Lives in: House/apartment Stairs: Yes: External: 4-10 depending on location  steps; on right going up Has following equipment at home: None  OCCUPATION: retired   PLOF: Independent with basic ADLs and wall/furniture cruising for past 6-8 months. 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    PATIENT GOALS improve back strength, decrease pain, being able to stay upright.    OBJECTIVE:   DIAGNOSTIC FINDINGS:  Imaging report: Severe lumbar spondylosis multilevel  severe facet arthrosis Multi level severe disc height loss Sacrum and SI joints normal  PATIENT SURVEYS:  FOTO 42  SCREENING FOR RED FLAGS: Bowel or bladder incontinence: No Spinal tumors: No Cauda equina syndrome: No Compression fracture:  No Abdominal aneurysm: No  COGNITION:  Overall cognitive status: Within functional limits for tasks assessed     SENSATION: WFL   POSTURE: rounded shoulders, decreased lumbar lordosis, and decreased thoracic kyphosis  PALPATION: TTP around left QL, left psis  Decrease in symptoms of radiation with prone extensions but reports increasing pain centrally in low back.   LUMBAR ROM:   Active  A/PROM  eval  Flexion 6 cm   Extension 2cm  Right lateral flexion 8 degrees (causes pain on left side)  Left lateral flexion 14 degrees  Right rotation   Left rotation    (Blank rows = not tested)  Flexion and extension measured using bisecting lines from PSIS, initial point 15 cm above.    LOWER EXTREMITY MMT:    MMT Right eval Left eval  Hip flexion 5 5  Hip extension 4 single leg bridge 4 single leg bridge  Hip abduction 4 4  Hip adduction 4 4  Hip internal rotation    Hip external rotation    Knee flexion 5 5  Knee extension 5 5  Ankle dorsiflexion 5 5  Ankle plantarflexion    Ankle inversion 5 5  Ankle eversion 5 5   (Blank rows = not tested)   FUNCTIONAL TESTS:  5 times sit to stand: 37.07 seconds 2 minute walk test: 280'  GAIT: Distance walked:280' Assistive device utilized: None Level of assistance: Complete Independence Comments: decreased cadence, inc knee flexion in stance,decreased trunk stability laterally     TODAY'S TREATMENT  01/09/22 POE x 2 min (centralized sx from knee to thigh)  Prone press ups (to elbows) x10 (centralized to buttock)  Bridge 2 x 10 SLR with ab brace  Manual STM to Bilateral lumbar paraspinals  pre and post dry needling for trigger point identification and surface area preparation     Trigger Point Dry-Needling  Treatment instructions: Expect mild to moderate muscle soreness. S/S of pneumothorax if dry needled over a lung field, and to seek immediate medical attention should they occur. Patient verbalized understanding of these instructions and education.  Patient Consent Given: Yes Education handout provided: Yes Muscles treated: Bilateral lumbar paraspinals  Electrical stimulation performed: No Parameters: N/A Treatment response/outcome: Good tolerance    01/07/22: POE x 2 min Prone press ups 2x10 Quadruped cat/camel 5x 10" with manual and verbal cues Child's pose 3x 30" STM prone to lumbar paraspinal, QL, iliac crest, PSIS, sacrum Seated QL stretch 3x 30"- HEP  Standing:  Vector stance 3x 5"  01/02/22 POE x1 min Prone press ups 2x10 Childs pose 10 sec x2  Cat camel x15 with manual and verbal cues Seated figure 4 stretch x15 sec x 4 b/l Attempted pelvis scoot in long sit Lateral reaches/rollout/ with physioball  STM to left QL and lumber pine, illiac crest Low thoracic/ high Lumbar grade 2 mobilizations  12/31/21: Wall arch with heel raise 10x 3" Controlled sit to stand with erect posture and eccentric control Tandem stance 3x 30" (3rd set partial tandem with pallof GTB 4x 10 Sidestep 2RT RTB around thigh  Seated: nerve glide 3x 10  Prone: POE 3 min with MHP x 5 min at EOS    PATIENT EDUCATION:  Education details:  6/21 centralization of symptoms, HEP, DN 12/10/21: Reviewed goals, educated importance of HEP compliance for maximal benefits and importance of posture.  Eval: Patient educated on exam findings, POC, scope of PT Person educated: Patient Education method: Explanation, Demonstration Education comprehension: verbalized understanding   HOME EXERCISE PROGRAM:  12/17/21 Access Code: [email or web address] URL: [email or web address] Date: 12/17/2021 Prepared by: Josue Hector  Exercises - Sidelying Hip Abduction  - 2 x daily - 7  x weekly - 2 sets - 10 reps 12/10/21: Prone on elbow with 2 pillow under hips Diaphragmatic breathing 24mn Isometric abdominal sets paired with exhalation 163m Bridge: 10x  12/31/21: seated sciatic nerve glide   ASSESSMENT:  CLINICAL IMPRESSION: Resumed core strengthening with bridging and leg raise with emphasis on TA activation. Noting improved symptoms with lumbar extension based exercise. Able to centralize symptoms form LT knee to buttock. Performed dry needling to address lumbar pain and restriction with good result. Patient noting improved symptoms following treatment today. Patient will continue to benefit from skilled therapy services to reduce remaining deficits and improve functional ability.    OBJECTIVE IMPAIRMENTS Abnormal gait, decreased activity tolerance, decreased endurance, decreased mobility, decreased ROM, decreased strength, increased fascial restrictions, impaired flexibility, improper body mechanics, postural dysfunction, and pain.   ACTIVITY LIMITATIONS carrying, bending, sitting, standing, and squatting  PARTICIPATION LIMITATIONS: cleaning, laundry, shopping, community activity, and yard work  PERSONAL FACTORS 3+ comorbidities:     Angina, Anxiety or Panic Disorders, Arthritis, Back pain, Congestive Heart Failure or Heart Disease, Depression, Gastrointestinal Disease, Hearing Impairment, High Blood Pressure, Osteoporosis, Prior Surgery, Sleep dysfunction, Stroke or TIA are also affecting patient's functional outcome.   REHAB POTENTIAL: Good  CLINICAL DECISION MAKING: Stable/uncomplicated  EVALUATION COMPLEXITY: Moderate   GOALS: Goals reviewed with patient? Yes  SHORT TERM GOALS: Target date: 12/23/2021  Patient will be independent with HEP in order to improve functional outcomes. Baseline:  Goal status: IN PROGRESS  2.  Patient will report at least 25% improvement in symptoms for improved quality of life. Baseline:  Goal status: IN PROGRESS  3.   Patient will improve five time sit to stand by 10 seconds to improve efficiency in transfers.  Baseline: 5 times sit to stand: 37.07 seconds  Goal status: IN PROGRESS  LONG TERM GOALS: Target date: 01/20/2022  Patient will report at least 75% improvement in symptoms for improved quality of life. Baseline:  Goal status: IN PROGRESS  2.  Patient will improve FOTO score by at least 5 points in order to indicate improved tolerance to activity. Baseline: 42 Goal status: IN PROGRESS  3.  Patient will report average back pain of no greater than 5/10 when completing household chores to improve quality of life.  Baseline: 8/10 Goal status: IN PROGRESS  4.  Patient will be able to ambulate at least 100 feet in 2MWT in order to demonstrate improved tolerance to activity. Baseline: 2 minute walk test: 280' Goal status: IN PROGRESS     PLAN: PT FREQUENCY: 2x/week  PT DURATION: other: 7 weeks  PLANNED INTERVENTIONS: PLANNED INTERVENTIONS: Therapeutic exercises, Therapeutic activity, Neuromuscular re-education, Balance training, Gait training, Patient/Family education, Joint manipulation, Joint mobilization, Stair training, Orthotic/Fit training, DME instructions, Aquatic Therapy, Dry Needling, Electrical stimulation, Spinal manipulation, Spinal mobilization, Cryotherapy, Moist heat, Compression bandaging, scar mobilization, Splintting, Taping, Traction, Ultrasound, Ionotophoresis '4mg'$ /ml Dexamethasone, and Manual therapy   PLAN FOR NEXT SESSION:  Continue balance,  stretching/mobility and core strength.  Pt reports relief with dry needling.  10:38 AM, 01/09/22 CaJosue HectorT DPT  Physical Therapist with [facility]([phone]

## 2022-01-13 NOTE — Progress Notes (Unsigned)
Cardiology Office Note  Date: 01/14/2022   ID: Andrew Bates, Andrew Bates 04-03-1946, MRN [id number]  PCP:  Sharilyn Sites, MD  Cardiologist:  Rozann Lesches, MD Electrophysiologist:  None   Chief Complaint  Patient presents with   Cardiac follow-up    History of Present Illness: Andrew Bates is a 76 y.o. male last seen in February by Ms. Vita Barley, I reviewed the note.  He is here today with his wife for a routine visit.  Reports an episode of angina this past Saturday, used to some of the nitroglycerin.  We discussed getting a fresh bottle.  He has not had progressive symptoms however.  Currently undergoing PT for chronic lower back pain with disc disease and spinal stenosis.  He is following with an orthopedic specialist.  I reviewed his medications which are stable and outlined below.  No major changes in cardiac regimen.  Lab work in December 2022 showed good LDL control at 57 on Crestor.  Past Medical History:  Diagnosis Date   Adenomatous colon polyp    Anxiety    Arthritis    Asthma    Coronary artery disease    Nonobstructive at cardiac catheterization 2020   Depression    DVT (deep venous thrombosis) ([place]) 2008   Essential hypertension    GERD (gastroesophageal reflux disease)    History of kidney stones    Hyperlipidemia    Internal hemorrhoids    Peripheral neuropathy    PTSD (post-traumatic stress disorder)    Sleep apnea    cpap   Vertigo     Past Surgical History:  Procedure Laterality Date   ANTERIOR CERVICAL DECOMP/DISCECTOMY FUSION N/A 07/09/2015   Procedure: ANTERIOR CERVICAL DECOMPRESSION/DISCECTOMY FUSION CERVICAL FIVE-SIX,CERVICAL SIX-SEVEN;  Surgeon: Newman Pies, MD;  Location: [place] [facility];  Service: Neurosurgery;  Laterality: N/A;   CARPAL TUNNEL RELEASE Bilateral 2014   COLONOSCOPY     CYSTOSCOPY/RETROGRADE/URETEROSCOPY/STONE EXTRACTION WITH BASKET Left 02/14/2014   Procedure: CYSTOSCOPY AND LEFT DOUBLE J STENT PLACEMENT;  ATTEMPTED STONE   EXTRACTION ;  Surgeon: Jorja Loa, MD;  [facility];  Service: Urology;  Laterality: Left;   ENDOLYMPHATIC SAC DECOMPRESSION Left 06/14/2019   Procedure: [facility] DECOMPRESSION;  Surgeon: Leta Baptist, MD;  Location: [place];  Service: ENT;  Laterality: Left;   ESOPHAGOGASTRODUODENOSCOPY ENDOSCOPY     INTRAVASCULAR PRESSURE WIRE/FFR STUDY N/A 06/25/2021   Procedure: INTRAVASCULAR PRESSURE WIRE/FFR STUDY;  Surgeon: Leonie Man, MD;  Location: [place] [facility];  Service: Cardiovascular;  Laterality: N/A;   KNEE SURGERY Right 2006   Arthroscopy - MCl tear   LEFT HEART CATH AND CORONARY ANGIOGRAPHY N/A 08/17/2018   Procedure: LEFT HEART CATH AND CORONARY ANGIOGRAPHY;  Surgeon: Burnell Blanks, MD;  Location: [place] [facility];  Service: Cardiovascular;  Laterality: N/A;   LEFT HEART CATH AND CORONARY ANGIOGRAPHY N/A 06/25/2021   Procedure: LEFT HEART CATH AND CORONARY ANGIOGRAPHY;  Surgeon: Leonie Man, MD;  Location: [place] [facility];  Service: Cardiovascular;  Laterality: N/A;   UPPER GASTROINTESTINAL ENDOSCOPY      Current Outpatient Medications  Medication Sig Dispense Refill   acetaminophen (TYLENOL) 500 MG tablet Take 500 mg by mouth every 6 (six) hours as needed for fever.     amLODipine (NORVASC) 5 MG tablet Take 1 tablet (5 mg total) by mouth daily. 90 tablet 3   Artificial Tear Ointment (DRY EYES OP) Apply 1 application to eye as needed (dry eyes).  aspirin EC 81 MG EC tablet Take 1 tablet (81 mg total) by mouth daily.     Biotin 5 MG CAPS Take 10 mg by mouth daily.      buPROPion (WELLBUTRIN XL) 150 MG 24 hr tablet Take 150 mg by mouth daily.     busPIRone (BUSPAR) 10 MG tablet Take 10 mg by mouth 2 (two) times daily. anxiety     diazepam (VALIUM) 10 MG tablet Take 10 mg by mouth 2 (two) times daily as needed for anxiety.     hydrocortisone (ANUSOL-HC) 2.5 % rectal cream Place 1 application  rectally at bedtime. 30 g 1    isosorbide mononitrate (IMDUR) 30 MG 24 hr tablet Take 1 tablet (30 mg total) by mouth daily. 90 tablet 3   losartan (COZAAR) 50 MG tablet TAKE 1 TABLET BY MOUTH TWICE A DAILY. (Patient taking differently: Take 50 mg by mouth 2 (two) times daily.) 180 tablet 2   mirtazapine (REMERON) 45 MG tablet Take 45 mg by mouth at bedtime.     nitroGLYCERIN (NITROSTAT) 0.4 MG SL tablet Place 1 tablet (0.4 mg total) under the tongue every 5 (five) minutes x 3 doses as needed for chest pain. 25 tablet 3   omeprazole (PRILOSEC) 40 MG capsule TAKE ONE CAPSULE BY MOUTH ONCE A DAY FOR HEARTBURN (TAKE ON AN EMPTY STOMACH 30 MINUTES PRIOR TO A MEAL)     ondansetron (ZOFRAN ODT) 4 MG disintegrating tablet Take 1 tablet (4 mg total) by mouth every 8 (eight) hours as needed for nausea or vomiting. 20 tablet 0   polyethylene glycol (MIRALAX / GLYCOLAX) 17 g packet Take 17 g by mouth daily.     rosuvastatin (CRESTOR) 20 MG tablet TAKE 1 TABLET BY MOUTH AT 6PM. (Patient taking differently: Take 20 mg by mouth daily.) 90 tablet 3   Wheat Dextrin (BENEFIBER DRINK MIX PO) Take by mouth.     No current facility-administered medications for this visit.   Allergies:  Other, Tamsulosin, Codeine, Morphine and related, Metoprolol, Oxybutynin, Pentazocine-naloxone hcl, Sertraline, and Topiramate   ROS: No palpitations or syncope.  Physical Exam: VS:  BP 138/88   Pulse 61   Ht '6\' 2"'$  (1.88 m)   Wt 212 lb 3.2 oz (96.3 kg)   SpO2 94%   BMI 27.24 kg/m , BMI Body mass index is 27.24 kg/m.  Wt Readings from Last 3 Encounters:  01/14/22 212 lb 3.2 oz (96.3 kg)  01/09/22 214 lb 2 oz (97.1 kg)  11/29/21 210 lb (95.3 kg)    General: Patient appears comfortable at rest. HEENT: Conjunctiva and lids normal, oropharynx clear. Neck: Supple, no elevated JVP or carotid bruits, no thyromegaly. Lungs: Clear to auscultation, nonlabored breathing at rest. Cardiac: Regular rate and rhythm, no S3 or significant systolic murmur, no  pericardial rub. Extremities: No pitting edema.  ECG:  An ECG dated 06/25/2021 was personally reviewed today and demonstrated:  Sinus bradycardia.  Recent Labwork: 06/26/2021: BUN 18; Creatinine, Ser 1.34; Hemoglobin 13.7; Platelets 198; Potassium 4.1; Sodium 138     Component Value Date/Time   CHOL 112 06/22/2021 0620   TRIG 72 06/22/2021 0620   HDL 41 06/22/2021 0620   CHOLHDL 2.7 06/22/2021 0620   VLDL 14 06/22/2021 0620   LDLCALC 57 06/22/2021 0620    Other Studies Reviewed Today:  Echocardiogram 06/22/2021:  1. Left ventricular ejection fraction, by estimation, is 60 to 65%. The  left ventricle has normal function. The left ventricle has no regional  wall motion  abnormalities. Left ventricular diastolic parameters are  consistent with Grade I diastolic  dysfunction (impaired relaxation).   2. Right ventricular systolic function is normal. The right ventricular  size is normal. There is normal pulmonary artery systolic pressure. The  estimated right ventricular systolic pressure is 63.8 mmHg.   3. The mitral valve is grossly normal. Trivial mitral valve  regurgitation.   4. The aortic valve is tricuspid. Aortic valve regurgitation is trivial.   5. The inferior vena cava is normal in size with greater than 50%  respiratory variability, suggesting right atrial pressure of 3 mmHg.   Cardiac catheterization 06/25/2021:   Prox LAD lesion is 50% stenosed. - RFR 0.98   Ost LAD to Prox LAD lesion is 30% stenosed.   Dist LM to Ost LAD lesion is 50% stenosed. - RFR 0.92-0.93 9NOT SIGNIFICANT)   Prox Cx lesion is 20% stenosed.   LV end diastolic pressure is normal.   There is no aortic valve stenosis.   Stable ostial and mid LAD disease-RFR nonischemic. Consider either noncardiac cause of chest pain versus coronary spasm.  Assessment and Plan:  1.  Mild to moderate, nonobstructive CAD by cardiac catheterization in 2020.  He has intermittent angina symptoms with plan to continue  medical therapy and observation.  Currently on aspirin, Norvasc, Cozaar, Imdur, Crestor, and as needed nitroglycerin which will be refilled for fresh bottle.  2.  Mixed hyperlipidemia, tolerating Crestor with last LDL 57.  3.  Essential hypertension, no changes made to present regimen.  Medication Adjustments/Labs and Tests Ordered: Current medicines are reviewed at length with the patient today.  Concerns regarding medicines are outlined above.   Tests Ordered: No orders of the defined types were placed in this encounter.   Medication Changes: No orders of the defined types were placed in this encounter.   Disposition:  Follow up  6 months.  Signed, Satira Sark, MD, [facility] 01/14/2022 8:38 AM    [facility] [address], [place], [place] [address] Phone: [phone]; Fax: [phone]

## 2022-01-14 ENCOUNTER — Ambulatory Visit (HOSPITAL_COMMUNITY): Payer: No Typology Code available for payment source | Admitting: Physical Therapy

## 2022-01-14 ENCOUNTER — Encounter: Payer: Self-pay | Admitting: Cardiology

## 2022-01-14 ENCOUNTER — Ambulatory Visit (INDEPENDENT_AMBULATORY_CARE_PROVIDER_SITE_OTHER): Payer: No Typology Code available for payment source | Admitting: Cardiology

## 2022-01-14 VITALS — BP 138/88 | HR 61 | Ht 74.0 in | Wt 212.2 lb

## 2022-01-14 DIAGNOSIS — E782 Mixed hyperlipidemia: Secondary | ICD-10-CM

## 2022-01-14 DIAGNOSIS — M5459 Other low back pain: Secondary | ICD-10-CM

## 2022-01-14 DIAGNOSIS — I25119 Atherosclerotic heart disease of native coronary artery with unspecified angina pectoris: Secondary | ICD-10-CM | POA: Diagnosis not present

## 2022-01-14 DIAGNOSIS — I10 Essential (primary) hypertension: Secondary | ICD-10-CM | POA: Diagnosis not present

## 2022-01-14 DIAGNOSIS — R2689 Other abnormalities of gait and mobility: Secondary | ICD-10-CM

## 2022-01-14 NOTE — Patient Instructions (Signed)
Medication Instructions:  Your physician recommends that you continue on your current medications as directed. Please refer to the Current Medication list given to you today.   Labwork: None today  Testing/Procedures: None today  Follow-Up: 6 months  Any Other Special Instructions Will Be Listed Below (If Applicable).  If you need a refill on your cardiac medications before your next appointment, please call your pharmacy.  

## 2022-01-14 NOTE — Therapy (Signed)
OUTPATIENT PHYSICAL THERAPY THORACOLUMBAR TREATMENT   Patient Name: Andrew Bates MRN: [id number] DOB:Jun 11, 1946, 76 y.o., male Today's Date: 01/14/2022  Progress Note Reporting Period 12/02/21 to 01/14/22  See note below for Objective Data and Assessment of Progress/Goals.      PT End of Session - 01/14/22 1029     Visit Number 10    Number of Visits 15    Date for PT Re-Evaluation 01/20/22    [facility] Type [facility] (approved 15 visits)    Progress Note Due on Visit 15    PT Start Time 1030    PT Stop Time 1113    PT Time Calculation (min) 43 min    Activity Tolerance Patient tolerated treatment well    Behavior During Therapy WFL for tasks assessed/performed                Past Medical History:  Diagnosis Date   Adenomatous colon polyp    Anxiety    Arthritis    Asthma    Coronary artery disease    Nonobstructive at cardiac catheterization 2020   Depression    DVT (deep venous thrombosis) ([place]) 2008   Essential hypertension    GERD (gastroesophageal reflux disease)    History of kidney stones    Hyperlipidemia    Internal hemorrhoids    Peripheral neuropathy    PTSD (post-traumatic stress disorder)    Sleep apnea    cpap   Vertigo    Past Surgical History:  Procedure Laterality Date   ANTERIOR CERVICAL DECOMP/DISCECTOMY FUSION N/A 07/09/2015   Procedure: ANTERIOR CERVICAL DECOMPRESSION/DISCECTOMY FUSION CERVICAL FIVE-SIX,CERVICAL SIX-SEVEN;  Surgeon: Newman Pies, MD;  Location: [place] [facility];  Service: Neurosurgery;  Laterality: N/A;   CARPAL TUNNEL RELEASE Bilateral 2014   COLONOSCOPY     CYSTOSCOPY/RETROGRADE/URETEROSCOPY/STONE EXTRACTION WITH BASKET Left 02/14/2014   Procedure: CYSTOSCOPY AND LEFT DOUBLE J STENT PLACEMENT;  ATTEMPTED STONE  EXTRACTION ;  Surgeon: Jorja Loa, MD;  Location: [facility];  Service: Urology;  Laterality: Left;   ENDOLYMPHATIC SAC DECOMPRESSION Left 06/14/2019   Procedure: ENDOLYMPHATIC [place];  Surgeon: Leta Baptist, MD;  Location: [place];  Service: ENT;  Laterality: Left;   ESOPHAGOGASTRODUODENOSCOPY ENDOSCOPY     INTRAVASCULAR PRESSURE WIRE/FFR STUDY N/A 06/25/2021   Procedure: INTRAVASCULAR PRESSURE WIRE/FFR STUDY;  Surgeon: Leonie Man, MD;  Location: [place] [facility];  Service: Cardiovascular;  Laterality: N/A;   KNEE SURGERY Right 2006   Arthroscopy - MCl tear   LEFT HEART CATH AND CORONARY ANGIOGRAPHY N/A 08/17/2018   Procedure: LEFT HEART CATH AND CORONARY ANGIOGRAPHY;  Surgeon: Burnell Blanks, MD;  Location: [place] [facility];  Service: Cardiovascular;  Laterality: N/A;   LEFT HEART CATH AND CORONARY ANGIOGRAPHY N/A 06/25/2021   Procedure: LEFT HEART CATH AND CORONARY ANGIOGRAPHY;  Surgeon: Leonie Man, MD;  Location: [place] [facility];  Service: Cardiovascular;  Laterality: N/A;   UPPER GASTROINTESTINAL ENDOSCOPY     Patient Active Problem List   Diagnosis Date Noted   Stage 3b chronic kidney disease (CKD) ([place]) 06/24/2021   PTSD (post-traumatic stress disorder)    Coronary artery disease, non-occlusive-by cardiac catheterization in 2020 01/10/2019   Unstable angina ([place]) 08/17/2018   Chest pain    Cervical spondylosis with radiculopathy 07/09/2015   Urinary frequency 02/25/2015   Dehydration 02/25/2015   Vertigo 02/25/2015   Hydronephrosis 02/15/2014   AKI (acute kidney injury) ([place]) 02/15/2014   Calculus of ureter 02/14/2014  Urinary hesitancy 11/17/2013   Overweight(278.02) 11/17/2013   Kidney stones 11/16/2013   Acute onset of severe vertigo 11/16/2013   Sepsis secondary to UTI ([place]) 11/16/2013   Essential hypertension 11/16/2013   Hyperlipidemia with target LDL less than 70 11/16/2013   GERD (gastroesophageal reflux disease) 11/16/2013    PCP: Sharilyn Sites MD  REFERRING PROVIDER: Newman Pies MD Next apt: August 18th 2023  REFERRING DIAG: M54.42 Chronic Bilateral LBP with left-sided sciatica    Rationale for Evaluation and Treatment Rehabilitation  THERAPY DIAG:  Other low back pain  Other abnormalities of gait and mobility  ONSET DATE:   SUBJECTIVE:                                                                                                                                                                                           SUBJECTIVE STATEMENT: Patient says his back is a little sore today. Thinks this is related to trimming and lawn mowing over the weekend.    PAIN:  Are you having pain? Yes: NPRS scale: 1/10 Pain location: Lt side of lower back Pain description: sore, achey Aggravating factors: walking  Relieving factors: massage  Radicular: No radicular symptoms on 01/07/22.  Eval:  Lt LE posterior to knee  PERTINENT HISTORY:  Imaging report: Severe lumbar spondylosis multilevel severe facet arthrosis Multi level severe disc height loss Sacrum and SI joints normal  Chronic Bilateral LBP with left-sided sciatica  unstable angina 06/25/2021  Neck surgery 6 years ago Right knee, meniscus surgery 2006 Left rotator cuff repair 2021 Hard of hearing  Heart catheterization jan 2023     PRECAUTIONS: None  WEIGHT BEARING RESTRICTIONS No  FALLS:  Has patient fallen in last 6 months? Yes. Number of falls 10 patients foot caught both levels of patio and fell on right elbow. Pt correlates falls to uneven grass/log/root areas in backyard. Patient feels like he isnt picking up his feet high enough.   LIVING ENVIRONMENT: Lives with: lives with their spouse Lives in: House/apartment Stairs: Yes: External: 4-10 depending on location  steps; on right going up Has following equipment at home: None  OCCUPATION: retired   PLOF: Independent with basic ADLs and wall/furniture cruising for past 6-8 months. 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    PATIENT GOALS improve back strength, decrease pain, being able to stay  upright.    OBJECTIVE:   DIAGNOSTIC FINDINGS:  Imaging report: Severe lumbar spondylosis multilevel severe facet arthrosis Multi level severe disc height loss Sacrum and SI joints normal  PATIENT SURVEYS:  FOTO 42  SCREENING FOR RED FLAGS: Bowel or bladder incontinence: No Spinal tumors: No Cauda equina syndrome: No Compression fracture: No Abdominal aneurysm: No  COGNITION:  Overall cognitive status: Within functional limits for tasks assessed     SENSATION: WFL   POSTURE: rounded shoulders, decreased lumbar lordosis, and decreased thoracic kyphosis  PALPATION: TTP around left QL, left psis  Decrease in symptoms of radiation with prone extensions but reports increasing pain centrally in low back.   LUMBAR ROM:   Active  A/PROM  eval  Flexion 6 cm   Extension 2cm  Right lateral flexion 8 degrees (causes pain on left side)  Left lateral flexion 14 degrees  Right rotation   Left rotation    (Blank rows = not tested)  Flexion and extension measured using bisecting lines from PSIS, initial point 15 cm above.    LOWER EXTREMITY MMT:    MMT Right eval Left eval  Hip flexion 5 5  Hip extension 4 single leg bridge 4 single leg bridge  Hip abduction 4 4  Hip adduction 4 4  Hip internal rotation    Hip external rotation    Knee flexion 5 5  Knee extension 5 5  Ankle dorsiflexion 5 5  Ankle plantarflexion    Ankle inversion 5 5  Ankle eversion 5 5   (Blank rows = not tested)   FUNCTIONAL TESTS:  5 times sit to stand: 37.07 seconds 2 minute walk test: 280'  GAIT: Distance walked:280' Assistive device utilized: None Level of assistance: Complete Independence Comments: decreased cadence, inc knee flexion in stance,decreased trunk stability laterally     TODAY'S TREATMENT  01/14/22 Reassess FOTO 57% function 5 x STS 10.19 sec 2 MWT 565 feet with no AD  POE 3 min (centralized from LT lumbar to center of back, decreased pain)   SLR with ab  brace  Dead bug 2 x 10  Plank from knees 5 x 10"   Manual STM to Bilateral lumbar paraspinals  pre and post dry needling for trigger point identification and surface area preparation    Trigger Point Dry-Needling  Treatment instructions: Expect mild to moderate muscle soreness. S/S of pneumothorax if dry needled over a lung field, and to seek immediate medical attention should they occur. Patient verbalized understanding of these instructions and education.  Patient Consent Given: Yes Education handout provided: Yes Muscles treated: Bilateral lumbar paraspinals  Electrical stimulation performed: No Parameters: N/A Treatment response/outcome: Good tolerance    01/09/22 POE x 2 min (centralized sx from knee to thigh)  Prone press ups (to elbows) x10 (centralized to buttock)  Bridge 2 x 10 SLR with ab brace  Manual STM to Bilateral lumbar paraspinals  pre and post dry needling for trigger point identification and surface area preparation    Trigger Point Dry-Needling  Treatment instructions: Expect mild to moderate muscle soreness. S/S of pneumothorax if dry needled over a lung field, and to seek immediate medical attention should they occur. Patient verbalized understanding of these instructions and education.  Patient Consent Given: Yes Education handout provided: Yes Muscles treated: Bilateral lumbar paraspinals  Electrical stimulation performed: No Parameters: N/A Treatment response/outcome: Good tolerance    01/07/22: POE x 2 min Prone press ups 2x10 Quadruped cat/camel 5x 10" with manual and verbal cues Child's pose 3x 30" STM prone to lumbar paraspinal, QL, iliac crest, PSIS, sacrum Seated QL stretch 3x 30"- HEP   Standing:  Vector stance 3x 5"  01/02/22 POE x1 min Prone press ups 2x10 Ardine Eng  pose 10 sec x2  Cat camel x15 with manual and verbal cues Seated figure 4 stretch x15 sec x 4 b/l Attempted pelvis scoot in long sit Lateral reaches/rollout/ with physioball   STM to left QL and lumber pine, illiac crest Low thoracic/ high Lumbar grade 2 mobilizations  12/31/21: Wall arch with heel raise 10x 3" Controlled sit to stand with erect posture and eccentric control Tandem stance 3x 30" (3rd set partial tandem with pallof GTB 4x 10 Sidestep 2RT RTB around thigh  Seated: nerve glide 3x 10  Prone: POE 3 min with MHP x 5 min at EOS    PATIENT EDUCATION:  Education details:  6/21 centralization of symptoms, HEP, DN 12/10/21: Reviewed goals, educated importance of HEP compliance for maximal benefits and importance of posture.  Eval: Patient educated on exam findings, POC, scope of PT Person educated: Patient Education method: Explanation, Demonstration Education comprehension: verbalized understanding   HOME EXERCISE PROGRAM:   01/14/22 - Static Prone on Elbows  - 3 x daily - 7 x weekly - 1 sets - 5 reps - 10 second hold - Dead Bug  - 3 x daily - 7 x weekly - 2 sets - 10 reps  12/17/21 Access Code: [email or web address] URL: [email or web address] Date: 12/17/2021 Prepared by: Josue Hector  Exercises - Sidelying Hip Abduction  - 2 x daily - 7 x weekly - 2 sets - 10 reps 12/10/21: Prone on elbow with 2 pillow under hips Diaphragmatic breathing 19mn Isometric abdominal sets paired with exhalation 171m Bridge: 10x  12/31/21: seated sciatic nerve glide   ASSESSMENT:  CLINICAL IMPRESSION: Patient making good progress toward therapy goals. Patient reports significant improvement in subjective report. Showing improved tolerance with core strength progressions. Patient remains limited by ROM restriction as well as increased pain with prolonged functional activity. Patient will continue to benefit from skilled therapy services to address remaining deficits for reduce pain and improved LOF with ADLs.   OBJECTIVE IMPAIRMENTS Abnormal gait, decreased activity tolerance, decreased endurance, decreased mobility, decreased ROM, decreased strength,  increased fascial restrictions, impaired flexibility, improper body mechanics, postural dysfunction, and pain.   ACTIVITY LIMITATIONS carrying, bending, sitting, standing, and squatting  PARTICIPATION LIMITATIONS: cleaning, laundry, shopping, community activity, and yard work  PERSONAL FACTORS 3+ comorbidities:     Angina, Anxiety or Panic Disorders, Arthritis, Back pain, Congestive Heart Failure or Heart Disease, Depression, Gastrointestinal Disease, Hearing Impairment, High Blood Pressure, Osteoporosis, Prior Surgery, Sleep dysfunction, Stroke or TIA are also affecting patient's functional outcome.   REHAB POTENTIAL: Good  CLINICAL DECISION MAKING: Stable/uncomplicated  EVALUATION COMPLEXITY: Moderate   GOALS: Goals reviewed with patient? Yes  SHORT TERM GOALS: Target date: 12/23/2021  Patient will be independent with HEP in order to improve functional outcomes. Baseline:  Goal status: MET  2.  Patient will report at least 25% improvement in symptoms for improved quality of life. Baseline: 30-35% improved Goal status: MET  3.  Patient will improve five time sit to stand by 10 seconds to improve efficiency in transfers.  Baseline: 5 times sit to stand: 10.19 seconds  Goal status: MET  LONG TERM GOALS: Target date: 01/20/2022  Patient will report at least 75% improvement in symptoms for improved quality of life. Baseline: 30-35% improved Goal status: IN PROGRESS  2.  Patient will improve FOTO score by at least 5 points in order to indicate improved tolerance to activity. Baseline: 57% Goal status: MET  3.  Patient will report average back pain of no greater than 5/10 when completing  household chores to improve quality of life.  Baseline: 4/10 Goal status: MET  4.  Patient will increased distance walked during 2 MWT by at least 100 feet in order to demonstrate improved tolerance to activity. Baseline: 2 minute walk test: 565 feet no AD  Goal status:  MET     PLAN: PT FREQUENCY: 2x/week  PT DURATION: other: 7 weeks  PLANNED INTERVENTIONS: PLANNED INTERVENTIONS: Therapeutic exercises, Therapeutic activity, Neuromuscular re-education, Balance training, Gait training, Patient/Family education, Joint manipulation, Joint mobilization, Stair training, Orthotic/Fit training, DME instructions, Aquatic Therapy, Dry Needling, Electrical stimulation, Spinal manipulation, Spinal mobilization, Cryotherapy, Moist heat, Compression bandaging, scar mobilization, Splintting, Taping, Traction, Ultrasound, Ionotophoresis 3m/ml Dexamethasone, and Manual therapy   PLAN FOR NEXT SESSION:  Continue balance,  stretching/mobility and core strength.  Pt reports relief with dry needling.  11:16 AM, 01/14/22 CJosue HectorPT DPT  [facility] ([phone]

## 2022-01-16 ENCOUNTER — Ambulatory Visit (HOSPITAL_COMMUNITY): Payer: No Typology Code available for payment source | Admitting: Physical Therapy

## 2022-01-16 DIAGNOSIS — M5459 Other low back pain: Secondary | ICD-10-CM | POA: Diagnosis not present

## 2022-01-16 DIAGNOSIS — R2689 Other abnormalities of gait and mobility: Secondary | ICD-10-CM

## 2022-01-16 NOTE — Therapy (Signed)
OUTPATIENT PHYSICAL THERAPY THORACOLUMBAR TREATMENT   Patient Name: Andrew Bates MRN: [id number] DOB:1946/04/03, 76 y.o., male Today's Date: 01/16/2022   See note below for Objective Data and Assessment of Progress/Goals.      PT End of Session - 01/16/22 1026     Visit Number 11    Number of Visits 15    Date for PT Re-Evaluation 01/20/22    Author[facility] Type [facility] (approved 15 visits)    Progress Note Due on Visit 15    PT Start Time 1029    PT Stop Time 1113    PT Time Calculation (min) 44 min    Activity Tolerance Patient tolerated treatment well    Behavior During Therapy WFL for tasks assessed/performed                Past Medical History:  Diagnosis Date   Adenomatous colon polyp    Anxiety    Arthritis    Asthma    Coronary artery disease    Nonobstructive at cardiac catheterization 2020   Depression    DVT (deep venous thrombosis) ([place]) 2008   Essential hypertension    GERD (gastroesophageal reflux disease)    History of kidney stones    Hyperlipidemia    Internal hemorrhoids    Peripheral neuropathy    PTSD (post-traumatic stress disorder)    Sleep apnea    cpap   Vertigo    Past Surgical History:  Procedure Laterality Date   ANTERIOR CERVICAL DECOMP/DISCECTOMY FUSION N/A 07/09/2015   Procedure: ANTERIOR CERVICAL DECOMPRESSION/DISCECTOMY FUSION CERVICAL FIVE-SIX,CERVICAL SIX-SEVEN;  Surgeon: Newman Pies, MD;  Location: [place];  Service: Neurosurgery;  Laterality: N/A;   CARPAL TUNNEL RELEASE Bilateral 2014   COLONOSCOPY     CYSTOSCOPY/RETROGRADE/URETEROSCOPY/STONE EXTRACTION WITH BASKET Left 02/14/2014   Procedure: CYSTOSCOPY AND LEFT DOUBLE J STENT PLACEMENT;  ATTEMPTED STONE  EXTRACTION ;  Surgeon: Jorja Loa, MD;  Location: [facility];  Service: Urology;  Laterality: Left;   ENDOLYMPHATIC SAC DECOMPRESSION Left 06/14/2019   Procedure: [facility] DECOMPRESSION;  Surgeon: Leta Baptist, MD;  Location: [place];  Service: ENT;  Laterality: Left;   ESOPHAGOGASTRODUODENOSCOPY ENDOSCOPY     INTRAVASCULAR PRESSURE WIRE/FFR STUDY N/A 06/25/2021   Procedure: INTRAVASCULAR PRESSURE WIRE/FFR STUDY;  Surgeon: Leonie Man, MD;  Location: [place] [facility];  Service: Cardiovascular;  Laterality: N/A;   KNEE SURGERY Right 2006   Arthroscopy - MCl tear   LEFT HEART CATH AND CORONARY ANGIOGRAPHY N/A 08/17/2018   Procedure: LEFT HEART CATH AND CORONARY ANGIOGRAPHY;  Surgeon: Burnell Blanks, MD;  Location: [place] [facility];  Service: Cardiovascular;  Laterality: N/A;   LEFT HEART CATH AND CORONARY ANGIOGRAPHY N/A 06/25/2021   Procedure: LEFT HEART CATH AND CORONARY ANGIOGRAPHY;  Surgeon: Leonie Man, MD;  Location: [place] [facility];  Service: Cardiovascular;  Laterality: N/A;   UPPER GASTROINTESTINAL ENDOSCOPY     Patient Active Problem List   Diagnosis Date Noted   Stage 3b chronic kidney disease (CKD) ([place]) 06/24/2021   PTSD (post-traumatic stress disorder)    Coronary artery disease, non-occlusive-by cardiac catheterization in 2020 01/10/2019   Unstable angina ([place]) 08/17/2018   Chest pain    Cervical spondylosis with radiculopathy 07/09/2015   Urinary frequency 02/25/2015   Dehydration 02/25/2015   Vertigo 02/25/2015   Hydronephrosis 02/15/2014   AKI (acute kidney injury) ([place]) 02/15/2014   Calculus of ureter 02/14/2014   Urinary hesitancy 11/17/2013   Overweight(278.02) 11/17/2013  Kidney stones 11/16/2013   Acute onset of severe vertigo 11/16/2013   Sepsis secondary to UTI ([place]) 11/16/2013   Essential hypertension 11/16/2013   Hyperlipidemia with target LDL less than 70 11/16/2013   GERD (gastroesophageal reflux disease) 11/16/2013    PCP: Sharilyn Sites MD  REFERRING PROVIDER: Newman Pies MD Next apt: August 18th 2023  REFERRING DIAG: M54.42 Chronic Bilateral LBP with left-sided sciatica   Rationale for Evaluation and Treatment  Rehabilitation  THERAPY DIAG:  Other low back pain  Other abnormalities of gait and mobility  ONSET DATE:   SUBJECTIVE:                                                                                                                                                                                           SUBJECTIVE STATEMENT: Patient reports no new issues. Back as a little sore but doing HEP which has helped.  PAIN:  Are you having pain? Yes: NPRS scale: 1/10 Pain location: Lt side of lower back Pain description: sore, achey Aggravating factors: walking  Relieving factors: massage  Radicular: No radicular symptoms on 01/07/22.  Eval:  Lt LE posterior to knee  PERTINENT HISTORY:  Imaging report: Severe lumbar spondylosis multilevel severe facet arthrosis Multi level severe disc height loss Sacrum and SI joints normal  Chronic Bilateral LBP with left-sided sciatica  unstable angina 06/25/2021  Neck surgery 6 years ago Right knee, meniscus surgery 2006 Left rotator cuff repair 2021 Hard of hearing  Heart catheterization jan 2023     PRECAUTIONS: None  WEIGHT BEARING RESTRICTIONS No  FALLS:  Has patient fallen in last 6 months? Yes. Number of falls 10 patients foot caught both levels of patio and fell on right elbow. Pt correlates falls to uneven grass/log/root areas in backyard. Patient feels like he isnt picking up his feet high enough.   LIVING ENVIRONMENT: Lives with: lives with their spouse Lives in: House/apartment Stairs: Yes: External: 4-10 depending on location  steps; on right going up Has following equipment at home: None  OCCUPATION: retired   PLOF: Independent with basic ADLs and wall/furniture cruising for past 6-8 months. 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    PATIENT GOALS improve back strength, decrease pain, being able to stay upright.    OBJECTIVE:   DIAGNOSTIC FINDINGS:  Imaging report:  Severe lumbar spondylosis multilevel severe facet arthrosis Multi level severe disc height loss Sacrum and SI joints normal  PATIENT SURVEYS:  FOTO 42  SCREENING FOR RED FLAGS: Bowel or bladder incontinence: No Spinal tumors: No Cauda equina syndrome:  No Compression fracture: No Abdominal aneurysm: No  COGNITION:  Overall cognitive status: Within functional limits for tasks assessed     SENSATION: WFL   POSTURE: rounded shoulders, decreased lumbar lordosis, and decreased thoracic kyphosis  PALPATION: TTP around left QL, left psis  Decrease in symptoms of radiation with prone extensions but reports increasing pain centrally in low back.   LUMBAR ROM:   Active  A/PROM  eval  Flexion 6 cm   Extension 2cm  Right lateral flexion 8 degrees (causes pain on left side)  Left lateral flexion 14 degrees  Right rotation   Left rotation    (Blank rows = not tested)  Flexion and extension measured using bisecting lines from PSIS, initial point 15 cm above.    LOWER EXTREMITY MMT:    MMT Right eval Left eval  Hip flexion 5 5  Hip extension 4 single leg bridge 4 single leg bridge  Hip abduction 4 4  Hip adduction 4 4  Hip internal rotation    Hip external rotation    Knee flexion 5 5  Knee extension 5 5  Ankle dorsiflexion 5 5  Ankle plantarflexion    Ankle inversion 5 5  Ankle eversion 5 5   (Blank rows = not tested)   FUNCTIONAL TESTS:  5 times sit to stand: 37.07 seconds 2 minute walk test: 280'  GAIT: Distance walked:280' Assistive device utilized: None Level of assistance: Complete Independence Comments: decreased cadence, inc knee flexion in stance,decreased trunk stability laterally     TODAY'S TREATMENT  01/16/22 Ab march x20   Deadbug x20 Bridge x 20 Plank from knees 4 x 20" Quadruped hip extension x 10    Manual STM to Bilateral lumbar paraspinals  pre and post dry needling for trigger point identification and surface area preparation     Trigger Point Dry-Needling  Treatment instructions: Expect mild to moderate muscle soreness. S/S of pneumothorax if dry needled over a lung field, and to seek immediate medical attention should they occur. Patient verbalized understanding of these instructions and education.  Patient Consent Given: Yes Education handout provided: Yes Muscles treated: Bilateral lumbar paraspinals  Electrical stimulation performed: No Parameters: N/A Treatment response/outcome: Good tolerance    01/14/22 Reassess FOTO 57% function 5 x STS 10.19 sec 2 MWT 565 feet with no AD  POE 3 min (centralized from LT lumbar to center of back, decreased pain)   SLR with ab brace  Dead bug 2 x 10  Plank from knees 5 x 10"   Manual STM to Bilateral lumbar paraspinals  pre and post dry needling for trigger point identification and surface area preparation    Trigger Point Dry-Needling  Treatment instructions: Expect mild to moderate muscle soreness. S/S of pneumothorax if dry needled over a lung field, and to seek immediate medical attention should they occur. Patient verbalized understanding of these instructions and education.  Patient Consent Given: Yes Education handout provided: Yes Muscles treated: Bilateral lumbar paraspinals  Electrical stimulation performed: No Parameters: N/A Treatment response/outcome: Good tolerance    01/09/22 POE x 2 min (centralized sx from knee to thigh)  Prone press ups (to elbows) x10 (centralized to buttock)  Bridge 2 x 10 SLR with ab brace  Manual STM to Bilateral lumbar paraspinals  pre and post dry needling for trigger point identification and surface area preparation    Trigger Point Dry-Needling  Treatment instructions: Expect mild to moderate muscle soreness. S/S of pneumothorax if dry needled over a lung field, and  to seek immediate medical attention should they occur. Patient verbalized understanding of these instructions and education.  Patient Consent Given:  Yes Education handout provided: Yes Muscles treated: Bilateral lumbar paraspinals  Electrical stimulation performed: No Parameters: N/A Treatment response/outcome: Good tolerance    01/07/22: POE x 2 min Prone press ups 2x10 Quadruped cat/camel 5x 10" with manual and verbal cues Child's pose 3x 30" STM prone to lumbar paraspinal, QL, iliac crest, PSIS, sacrum Seated QL stretch 3x 30"- HEP   Standing:  Vector stance 3x 5"    PATIENT EDUCATION:  Education details:  6/21 centralization of symptoms, HEP, DN 12/10/21: Reviewed goals, educated importance of HEP compliance for maximal benefits and importance of posture.  Eval: Patient educated on exam findings, POC, scope of PT Person educated: Patient Education method: Explanation, Demonstration Education comprehension: verbalized understanding   HOME EXERCISE PROGRAM:   01/14/22 - Static Prone on Elbows  - 3 x daily - 7 x weekly - 1 sets - 5 reps - 10 second hold - Dead Bug  - 3 x daily - 7 x weekly - 2 sets - 10 reps  12/17/21 Access Code: [email or web address] URL: [email or web address] Date: 12/17/2021 Prepared by: Josue Hector  Exercises - Sidelying Hip Abduction  - 2 x daily - 7 x weekly - 2 sets - 10 reps 12/10/21: Prone on elbow with 2 pillow under hips Diaphragmatic breathing 24mn Isometric abdominal sets paired with exhalation 151m Bridge: 10x  12/31/21: seated sciatic nerve glide   ASSESSMENT:  CLINICAL IMPRESSION: Patient tolerated session well today. Able to progress core strengthening with increased hold time on planks. Added quadruped hip extension with good tolerance but required verbal cues for neutral spine and level hips for max effect on core activity. Patient will continue to benefit from skilled therapy services to reduce remaining deficits and improve functional ability.    OBJECTIVE IMPAIRMENTS Abnormal gait, decreased activity tolerance, decreased endurance, decreased mobility, decreased  ROM, decreased strength, increased fascial restrictions, impaired flexibility, improper body mechanics, postural dysfunction, and pain.   ACTIVITY LIMITATIONS carrying, bending, sitting, standing, and squatting  PARTICIPATION LIMITATIONS: cleaning, laundry, shopping, community activity, and yard work  PERSONAL FACTORS 3+ comorbidities:     Angina, Anxiety or Panic Disorders, Arthritis, Back pain, Congestive Heart Failure or Heart Disease, Depression, Gastrointestinal Disease, Hearing Impairment, High Blood Pressure, Osteoporosis, Prior Surgery, Sleep dysfunction, Stroke or TIA are also affecting patient's functional outcome.   REHAB POTENTIAL: Good  CLINICAL DECISION MAKING: Stable/uncomplicated  EVALUATION COMPLEXITY: Moderate   GOALS: Goals reviewed with patient? Yes  SHORT TERM GOALS: Target date: 12/23/2021  Patient will be independent with HEP in order to improve functional outcomes. Baseline:  Goal status: MET  2.  Patient will report at least 25% improvement in symptoms for improved quality of life. Baseline: 30-35% improved Goal status: MET  3.  Patient will improve five time sit to stand by 10 seconds to improve efficiency in transfers.  Baseline: 5 times sit to stand: 10.19 seconds  Goal status: MET  LONG TERM GOALS: Target date: 01/20/2022  Patient will report at least 75% improvement in symptoms for improved quality of life. Baseline: 30-35% improved Goal status: IN PROGRESS  2.  Patient will improve FOTO score by at least 5 points in order to indicate improved tolerance to activity. Baseline: 57% Goal status: MET  3.  Patient will report average back pain of no greater than 5/10 when completing household chores to improve quality of life.  Baseline: 4/10 Goal status: MET  4.  Patient will increased distance walked during 2 MWT by at least 100 feet in order to demonstrate improved tolerance to activity. Baseline: 2 minute walk test: 565 feet no AD  Goal  status: MET     PLAN: PT FREQUENCY: 2x/week  PT DURATION: other: 7 weeks  PLANNED INTERVENTIONS: PLANNED INTERVENTIONS: Therapeutic exercises, Therapeutic activity, Neuromuscular re-education, Balance training, Gait training, Patient/Family education, Joint manipulation, Joint mobilization, Stair training, Orthotic/Fit training, DME instructions, Aquatic Therapy, Dry Needling, Electrical stimulation, Spinal manipulation, Spinal mobilization, Cryotherapy, Moist heat, Compression bandaging, scar mobilization, Splintting, Taping, Traction, Ultrasound, Ionotophoresis 22m/ml Dexamethasone, and Manual therapy   PLAN FOR NEXT SESSION:  Continue balance,  stretching/mobility and core strength.  Pt reports relief with dry needling.  11:15 AM, 01/16/22 CJosue HectorPT DPT  Physical Therapist with [facility] ([phone]

## 2022-01-20 ENCOUNTER — Encounter (HOSPITAL_COMMUNITY): Payer: Self-pay | Admitting: Physical Therapy

## 2022-01-20 ENCOUNTER — Ambulatory Visit (HOSPITAL_COMMUNITY): Payer: No Typology Code available for payment source | Admitting: Physical Therapy

## 2022-01-20 DIAGNOSIS — R2689 Other abnormalities of gait and mobility: Secondary | ICD-10-CM

## 2022-01-20 DIAGNOSIS — M5459 Other low back pain: Secondary | ICD-10-CM

## 2022-01-20 DIAGNOSIS — R296 Repeated falls: Secondary | ICD-10-CM

## 2022-01-20 NOTE — Therapy (Addendum)
OUTPATIENT PHYSICAL THERAPY THORACOLUMBAR TREATMENT   Patient Name: Andrew Bates MRN: [id number] DOB:1946/01/28, 76 y.o., male Today's Date: 01/20/2022  PHYSICAL THERAPY DISCHARGE SUMMARY  Visits from Start of Care: 12  Current functional level related to goals / functional outcomes: See below   Remaining deficits: See below   Education / Equipment: See below   Patient agrees to discharge. Patient goals were met. Patient is being discharged due to meeting the stated rehab goals.     PT End of Session - 01/20/22 0944     Visit Number 12    Number of Visits 15    Date for PT Re-Evaluation 01/20/22    [facility] Type [facility] (approved 15 visits)    Progress Note Due on Visit 15    PT Start Time 0945    PT Stop Time 1023    PT Time Calculation (min) 38 min    Activity Tolerance Patient tolerated treatment well    Behavior During Therapy WFL for tasks assessed/performed                Past Medical History:  Diagnosis Date   Adenomatous colon polyp    Anxiety    Arthritis    Asthma    Coronary artery disease    Nonobstructive at cardiac catheterization 2020   Depression    DVT (deep venous thrombosis) ([place]) 2008   Essential hypertension    GERD (gastroesophageal reflux disease)    History of kidney stones    Hyperlipidemia    Internal hemorrhoids    Peripheral neuropathy    PTSD (post-traumatic stress disorder)    Sleep apnea    cpap   Vertigo    Past Surgical History:  Procedure Laterality Date   ANTERIOR CERVICAL DECOMP/DISCECTOMY FUSION N/A 07/09/2015   Procedure: ANTERIOR CERVICAL DECOMPRESSION/DISCECTOMY FUSION CERVICAL FIVE-SIX,CERVICAL SIX-SEVEN;  Surgeon: Newman Pies, MD;  Location: [facility];  Service: Neurosurgery;  Laterality: N/A;   CARPAL TUNNEL RELEASE Bilateral 2014   COLONOSCOPY     CYSTOSCOPY/RETROGRADE/URETEROSCOPY/STONE EXTRACTION WITH BASKET Left 02/14/2014   Procedure: CYSTOSCOPY AND LEFT DOUBLE J STENT  PLACEMENT;  ATTEMPTED STONE  EXTRACTION ;  Surgeon: Jorja Loa, MD;  Location: [facility];  Service: Urology;  Laterality: Left;   ENDOLYMPHATIC SAC DECOMPRESSION Left 06/14/2019   Procedure: [facility] DECOMPRESSION;  Surgeon: Leta Baptist, MD;  Location: [facility];  Service: ENT;  Laterality: Left;   ESOPHAGOGASTRODUODENOSCOPY ENDOSCOPY     INTRAVASCULAR PRESSURE WIRE/FFR STUDY N/A 06/25/2021   Procedure: INTRAVASCULAR PRESSURE WIRE/FFR STUDY;  Surgeon: Leonie Man, MD;  Location: [place] [facility];  Service: Cardiovascular;  Laterality: N/A;   KNEE SURGERY Right 2006   Arthroscopy - MCl tear   LEFT HEART CATH AND CORONARY ANGIOGRAPHY N/A 08/17/2018   Procedure: LEFT HEART CATH AND CORONARY ANGIOGRAPHY;  Surgeon: Burnell Blanks, MD;  Location: [place];  Service: Cardiovascular;  Laterality: N/A;   LEFT HEART CATH AND CORONARY ANGIOGRAPHY N/A 06/25/2021   Procedure: LEFT HEART CATH AND CORONARY ANGIOGRAPHY;  Surgeon: Leonie Man, MD;  Location: [place] [facility];  Service: Cardiovascular;  Laterality: N/A;   UPPER GASTROINTESTINAL ENDOSCOPY     Patient Active Problem List   Diagnosis Date Noted   Stage 3b chronic kidney disease (CKD) ([place]) 06/24/2021   PTSD (post-traumatic stress disorder)    Coronary artery disease, non-occlusive-by cardiac catheterization in 2020 01/10/2019   Unstable angina ([place]) 08/17/2018   Chest pain  Cervical spondylosis with radiculopathy 07/09/2015   Urinary frequency 02/25/2015   Dehydration 02/25/2015   Vertigo 02/25/2015   Hydronephrosis 02/15/2014   AKI (acute kidney injury) ([place]) 02/15/2014   Calculus of ureter 02/14/2014   Urinary hesitancy 11/17/2013   Overweight(278.02) 11/17/2013   Kidney stones 11/16/2013   Acute onset of severe vertigo 11/16/2013   Sepsis secondary to UTI ([place]) 11/16/2013   Essential hypertension 11/16/2013   Hyperlipidemia with target LDL less than 70 11/16/2013   GERD  (gastroesophageal reflux disease) 11/16/2013    PCP: Sharilyn Sites MD  REFERRING PROVIDER: Newman Pies MD Next apt: August 18th 2023  REFERRING DIAG: M54.42 Chronic Bilateral LBP with left-sided sciatica   Rationale for Evaluation and Treatment Rehabilitation  THERAPY DIAG:  Other low back pain  Other abnormalities of gait and mobility  Repeated falls  ONSET DATE:   SUBJECTIVE:                                                                                                                                                                                           SUBJECTIVE STATEMENT: Patient reports he feels like what he has been given is good. Symptoms decrease with exercises. He knows that he needs to keep up with exercises or symptoms return. He has been doing HEP. Patient states 80% improvement since beginning PT. Remaining deficits is strength and mobility with lifting/ transfers. Has been trying to walk longer. The more he walks the more he notices he bends over.   PAIN:  Are you having pain? Yes: NPRS scale: 2/10 Pain location: Lt side of lower back Pain description: sore, achey Aggravating factors: walking  Relieving factors: massage  Radicular: No radicular symptoms on 01/07/22.  Eval:  Lt LE posterior to knee  PERTINENT HISTORY:  Imaging report: Severe lumbar spondylosis multilevel severe facet arthrosis Multi level severe disc height loss Sacrum and SI joints normal  Chronic Bilateral LBP with left-sided sciatica  unstable angina 06/25/2021  Neck surgery 6 years ago Right knee, meniscus surgery 2006 Left rotator cuff repair 2021 Hard of hearing  Heart catheterization jan 2023     PRECAUTIONS: None  WEIGHT BEARING RESTRICTIONS No  FALLS:  Has patient fallen in last 6 months? Yes. Number of falls 10 patients foot caught both levels of patio and fell on right elbow. Pt correlates falls to uneven grass/log/root areas in backyard. Patient feels like he  isnt picking up his feet high enough.   LIVING ENVIRONMENT: Lives with: lives with their spouse Lives in: House/apartment Stairs: Yes: External: 4-10 depending on location  steps; on right going up Has following equipment at home: None  OCCUPATION: retired   PLOF: Independent with basic ADLs and wall/furniture cruising for past 6-8 months. 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    PATIENT GOALS improve back strength, decrease pain, being able to stay upright.    OBJECTIVE:   DIAGNOSTIC FINDINGS:  Imaging report: Severe lumbar spondylosis multilevel severe facet arthrosis Multi level severe disc height loss Sacrum and SI joints normal  PATIENT SURVEYS:  FOTO 42  01/20/22 53% function  SCREENING FOR RED FLAGS: Bowel or bladder incontinence: No Spinal tumors: No Cauda equina syndrome: No Compression fracture: No Abdominal aneurysm: No  COGNITION:  Overall cognitive status: Within functional limits for tasks assessed     SENSATION: WFL   POSTURE: rounded shoulders, decreased lumbar lordosis, and decreased thoracic kyphosis  PALPATION: TTP around left QL, left psis  Decrease in symptoms of radiation with prone extensions but reports increasing pain centrally in low back.   LUMBAR ROM:   Active  A/PROM  eval  Flexion 6 cm   Extension 2cm  Right lateral flexion 8 degrees (causes pain on left side)  Left lateral flexion 14 degrees  Right rotation   Left rotation    (Blank rows = not tested)  Flexion and extension measured using bisecting lines from PSIS, initial point 15 cm above.    LOWER EXTREMITY MMT:    MMT Right eval Left eval Right  01/20/22 Left 01/20/22  Hip flexion 5 5 5 5  Hip extension 4 single leg bridge 4 single leg bridge 5 5  Hip abduction 4 4 5 5  Hip adduction 4 4    Hip internal rotation      Hip external rotation      Knee flexion 5 5    Knee extension 5 5    Ankle dorsiflexion 5 5     Ankle plantarflexion      Ankle inversion 5 5    Ankle eversion 5 5     (Blank rows = not tested)   FUNCTIONAL TESTS:  5 times sit to stand: 37.07 seconds 2 minute walk test: 280'  GAIT: Distance walked:280' Assistive device utilized: None Level of assistance: Complete Independence Comments: decreased cadence, inc knee flexion in stance,decreased trunk stability laterally     TODAY'S TREATMENT  01/20/22 Reassessment FOTO 53% function  Manual STM to Bilateral lumbar paraspinals  pre and post dry needling for trigger point identification and surface area preparation    Trigger Point Dry-Needling  Treatment instructions: Expect mild to moderate muscle soreness. S/S of pneumothorax if dry needled over a lung field, and to seek immediate medical attention should they occur. Patient verbalized understanding of these instructions and education.  Patient Consent Given: Yes Education handout provided: Yes Muscles treated: Leftlumbar paraspinals  Electrical stimulation performed: No Parameters: N/A Treatment response/outcome: Good tolerance    01/16/22 Ab march x20   Deadbug x20 Bridge x 20 Plank from knees 4 x 20" Quadruped hip extension x 10    Manual STM to Bilateral lumbar paraspinals  pre and post dry needling for trigger point identification and surface area preparation    Trigger Point Dry-Needling  Treatment instructions: Expect mild to moderate muscle soreness. S/S of pneumothorax if dry needled over a lung field, and to seek immediate medical attention should they occur. Patient verbalized understanding of these instructions and education.  Patient Consent Given: Yes Education handout provided: Yes Muscles treated: Bilateral lumbar paraspinals  Electrical stimulation   performed: No Parameters: N/A Treatment response/outcome: Good tolerance    01/14/22 Reassess FOTO 57% function 5 x STS 10.19 sec 2 MWT 565 feet with no AD  POE 3 min (centralized from LT lumbar  to center of back, decreased pain)   SLR with ab brace  Dead bug 2 x 10  Plank from knees 5 x 10"   Manual STM to Bilateral lumbar paraspinals  pre and post dry needling for trigger point identification and surface area preparation    Trigger Point Dry-Needling  Treatment instructions: Expect mild to moderate muscle soreness. S/S of pneumothorax if dry needled over a lung field, and to seek immediate medical attention should they occur. Patient verbalized understanding of these instructions and education.  Patient Consent Given: Yes Education handout provided: Yes Muscles treated: Bilateral lumbar paraspinals  Electrical stimulation performed: No Parameters: N/A Treatment response/outcome: Good tolerance    01/09/22 POE x 2 min (centralized sx from knee to thigh)  Prone press ups (to elbows) x10 (centralized to buttock)  Bridge 2 x 10 SLR with ab brace  Manual STM to Bilateral lumbar paraspinals  pre and post dry needling for trigger point identification and surface area preparation    Trigger Point Dry-Needling  Treatment instructions: Expect mild to moderate muscle soreness. S/S of pneumothorax if dry needled over a lung field, and to seek immediate medical attention should they occur. Patient verbalized understanding of these instructions and education.  Patient Consent Given: Yes Education handout provided: Yes Muscles treated: Bilateral lumbar paraspinals  Electrical stimulation performed: No Parameters: N/A Treatment response/outcome: Good tolerance    01/07/22: POE x 2 min Prone press ups 2x10 Quadruped cat/camel 5x 10" with manual and verbal cues Child's pose 3x 30" STM prone to lumbar paraspinal, QL, iliac crest, PSIS, sacrum Seated QL stretch 3x 30"- HEP   Standing:  Vector stance 3x 5"    PATIENT EDUCATION:  Education details:  7/31: beginning a walking program, reassessment findings, gradual increases in activity, returning to PT if needed, DN 6/21  centralization of symptoms, HEP, DN 12/10/21: Reviewed goals, educated importance of HEP compliance for maximal benefits and importance of posture.  Eval: Patient educated on exam findings, POC, scope of PT Person educated: Patient Education method: Explanation, Demonstration Education comprehension: verbalized understanding   HOME EXERCISE PROGRAM:   01/14/22 - Static Prone on Elbows  - 3 x daily - 7 x weekly - 1 sets - 5 reps - 10 second hold - Dead Bug  - 3 x daily - 7 x weekly - 2 sets - 10 reps  12/17/21 Access Code: [email or web address] URL: [email or web address] Date: 12/17/2021 Prepared by: Cameron Caffaro  Exercises - Sidelying Hip Abduction  - 2 x daily - 7 x weekly - 2 sets - 10 reps 12/10/21: Prone on elbow with 2 pillow under hips Diaphragmatic breathing 1min Isometric abdominal sets paired with exhalation 1min Bridge: 10x  12/31/21: seated sciatic nerve glide   ASSESSMENT:  CLINICAL IMPRESSION: Patient has met all short and long term goals with ability to complete HEP and improvement in symptoms, activity tolerance, strength, functional mobility. He remains limited by continued symptoms with prolonged activity and increased difficulty with heavy ADL. Discussed transitioning to HEP for self management.  Educated patient on progress made. Completed dry needling to lower left lumbar paraspinals and patient stating decrease in symptoms following. Patient discharged from PT at this time.    OBJECTIVE IMPAIRMENTS Abnormal gait, decreased activity tolerance, decreased endurance, decreased mobility, decreased ROM, decreased strength, increased   fascial restrictions, impaired flexibility, improper body mechanics, postural dysfunction, and pain.   ACTIVITY LIMITATIONS carrying, bending, sitting, standing, and squatting  PARTICIPATION LIMITATIONS: cleaning, laundry, shopping, community activity, and yard work  PERSONAL FACTORS 3+ comorbidities:     Angina, Anxiety or Panic  Disorders, Arthritis, Back pain, Congestive Heart Failure or Heart Disease, Depression, Gastrointestinal Disease, Hearing Impairment, High Blood Pressure, Osteoporosis, Prior Surgery, Sleep dysfunction, Stroke or TIA are also affecting patient's functional outcome.   REHAB POTENTIAL: Good  CLINICAL DECISION MAKING: Stable/uncomplicated  EVALUATION COMPLEXITY: Moderate   GOALS: Goals reviewed with patient? Yes  SHORT TERM GOALS: Target date: 12/23/2021  Patient will be independent with HEP in order to improve functional outcomes. Baseline:  Goal status: MET  2.  Patient will report at least 25% improvement in symptoms for improved quality of life. Baseline: 30-35% improved Goal status: MET  3.  Patient will improve five time sit to stand by 10 seconds to improve efficiency in transfers.  Baseline: 5 times sit to stand: 10.19 seconds  Goal status: MET  LONG TERM GOALS: Target date: 01/20/2022  Patient will report at least 75% improvement in symptoms for improved quality of life. Baseline: 30-35% improved 7/31 80% improvement  Goal status: MET  2.  Patient will improve FOTO score by at least 5 points in order to indicate improved tolerance to activity. Baseline: 57% 7/31 53% function Goal status: MET  3.  Patient will report average back pain of no greater than 5/10 when completing household chores to improve quality of life.  Baseline: 4/10 Goal status: MET  4.  Patient will increased distance walked during 2 MWT by at least 100 feet in order to demonstrate improved tolerance to activity. Baseline: 2 minute walk test: 565 feet no AD  Goal status: MET     PLAN: PT FREQUENCY: 2x/week  PT DURATION: other: 7 weeks  PLANNED INTERVENTIONS: PLANNED INTERVENTIONS: Therapeutic exercises, Therapeutic activity, Neuromuscular re-education, Balance training, Gait training, Patient/Family education, Joint manipulation, Joint mobilization, Stair training, Orthotic/Fit training, DME  instructions, Aquatic Therapy, Dry Needling, Electrical stimulation, Spinal manipulation, Spinal mobilization, Cryotherapy, Moist heat, Compression bandaging, scar mobilization, Splintting, Taping, Traction, Ultrasound, Ionotophoresis 4mg/ml Dexamethasone, and Manual therapy   PLAN FOR NEXT SESSION: n/a  9:44 AM, 01/20/22 Andrew S. Zaunegger PT, DPT Physical Therapist at [facility]  

## 2022-01-22 ENCOUNTER — Encounter (HOSPITAL_COMMUNITY): Payer: PPO | Admitting: Physical Therapy

## 2022-02-07 ENCOUNTER — Encounter: Payer: PPO | Admitting: Internal Medicine

## 2022-02-18 ENCOUNTER — Ambulatory Visit: Payer: PPO | Admitting: Internal Medicine

## 2022-02-18 ENCOUNTER — Encounter: Payer: Self-pay | Admitting: Internal Medicine

## 2022-02-18 VITALS — BP 116/60 | HR 54 | Ht 74.0 in | Wt 210.0 lb

## 2022-02-18 DIAGNOSIS — K648 Other hemorrhoids: Secondary | ICD-10-CM | POA: Diagnosis not present

## 2022-02-18 NOTE — Progress Notes (Signed)
Andrew Bates is a 76 year old male who returns for additional hemorrhoidal banding for symptomatic internal hemorrhoids.  Up-to-date with colonoscopic surveillance.  Successful banding in 2018 no recurrent prolapsing and bleeding internal hemorrhoids  Feels better after last banding on 01/09/2022; still some prolapsing tissue.  Not as much bleeding. He has added MiraLAX to his fiber supplement Some days this helps, other day stool is sticky and difficult to clean.  Prolapsing tissue gets in the way.   PROCEDURE NOTE:  The patient presents with symptomatic grade 2 to 3 internal hemorrhoids, requesting rubber band ligation of his hemorrhoidal disease.  All risks, benefits and alternative forms of therapy were described and informed consent was obtained.   The anorectum was pre-medicated with 0.125% nitroglycerin ointment The decision was made to band the RP (RA 1 month ago) internal hemorrhoid, and the [place] was used to perform band ligation without complication.   Digital anorectal examination was then performed to assure proper positioning of the band, and to adjust the banded tissue as required.  The patient was discharged home without pain or other issues.  Dietary and behavioral recommendations were given and along with follow-up instructions.     The following adjunctive treatments were recommended: Continue MiraLAX and fiber supplement Avoid prolonged periods on the toilet  The patient will return as scheduled for follow-up and possible additional banding as required. No complications were encountered and the patient tolerated the procedure well.

## 2022-02-18 NOTE — Patient Instructions (Signed)
  If you are age 75 or older, your body mass index should be between 23-30. Your Body mass index is 26.96 kg/m. If this is out of the aforementioned range listed, please consider follow up with your Primary Care Provider.  If you are age 49 or younger, your body mass index should be between 19-25. Your Body mass index is 26.96 kg/m. If this is out of the aformentioned range listed, please consider follow up with your Primary Care Provider.   HEMORRHOID BANDING PROCEDURE    FOLLOW-UP CARE   The procedure you have had should have been relatively painless since the banding of the area involved does not have nerve endings and there is no pain sensation.  The rubber band cuts off the blood supply to the hemorrhoid and the band may fall off as soon as 48 hours after the banding (the band may occasionally be seen in the toilet bowl following a bowel movement). You may notice a temporary feeling of fullness in the rectum which should respond adequately to plain Tylenol or Motrin.  Following the banding, avoid strenuous exercise that evening and resume full activity the next day.  A sitz bath (soaking in a warm tub) or bidet is soothing, and can be useful for cleansing the area after bowel movements.     To avoid constipation, take two tablespoons of natural wheat bran, natural oat bran, flax, Benefiber or any over the counter fiber supplement and increase your water intake to 7-8 glasses daily.    Unless you have been prescribed anorectal medication, do not put anything inside your rectum for two weeks: No suppositories, enemas, fingers, etc.  Occasionally, you may have more bleeding than usual after the banding procedure.  This is often from the untreated hemorrhoids rather than the treated one.  Don't be concerned if there is a tablespoon or so of blood.  If there is more blood than this, lie flat with your bottom higher than your head and apply an ice pack to the area. If the bleeding does not stop  within a half an hour or if you feel faint, call our office at [phone] or go to the emergency room.  Problems are not common; however, if there is a substantial amount of bleeding, severe pain, chills, fever or difficulty passing urine (very rare) or other problems, you should call us at [phone] or report to the nearest emergency room.  Do not stay seated continuously for more than 2-3 hours for a day or two after the procedure.  Tighten your buttock muscles 10-15 times every two hours and take 10-15 deep breaths every 1-2 hours.  Do not spend more than a few minutes on the toilet if you cannot empty your bowel; instead re-visit the toilet at a later time.  The [facility] providers would like to encourage you to use [facility] to communicate with providers for non-urgent requests or questions.  Due to long hold times on the telephone, sending your provider a message by [facility] may be a faster and more efficient way to get a response.  Please allow 48 business hours for a response.  Please remember that this is for non-urgent requests.   It was a pleasure to see you today!  Thank you for trusting me with your gastrointestinal care!

## 2022-03-04 DIAGNOSIS — E7849 Other hyperlipidemia: Secondary | ICD-10-CM | POA: Diagnosis not present

## 2022-03-04 DIAGNOSIS — Z6828 Body mass index (BMI) 28.0-28.9, adult: Secondary | ICD-10-CM | POA: Diagnosis not present

## 2022-03-04 DIAGNOSIS — R7309 Other abnormal glucose: Secondary | ICD-10-CM | POA: Diagnosis not present

## 2022-03-04 DIAGNOSIS — E782 Mixed hyperlipidemia: Secondary | ICD-10-CM | POA: Diagnosis not present

## 2022-03-04 DIAGNOSIS — I2510 Atherosclerotic heart disease of native coronary artery without angina pectoris: Secondary | ICD-10-CM | POA: Diagnosis not present

## 2022-03-04 DIAGNOSIS — Z0001 Encounter for general adult medical examination with abnormal findings: Secondary | ICD-10-CM | POA: Diagnosis not present

## 2022-03-04 DIAGNOSIS — F4312 Post-traumatic stress disorder, chronic: Secondary | ICD-10-CM | POA: Diagnosis not present

## 2022-03-04 DIAGNOSIS — E1100 Type 2 diabetes mellitus with hyperosmolarity without nonketotic hyperglycemic-hyperosmolar coma (NKHHC): Secondary | ICD-10-CM | POA: Diagnosis not present

## 2022-03-04 DIAGNOSIS — I10 Essential (primary) hypertension: Secondary | ICD-10-CM | POA: Diagnosis not present

## 2022-03-04 DIAGNOSIS — G64 Other disorders of peripheral nervous system: Secondary | ICD-10-CM | POA: Diagnosis not present

## 2022-03-04 DIAGNOSIS — Z125 Encounter for screening for malignant neoplasm of prostate: Secondary | ICD-10-CM | POA: Diagnosis not present

## 2022-03-04 DIAGNOSIS — Z1331 Encounter for screening for depression: Secondary | ICD-10-CM | POA: Diagnosis not present

## 2022-03-04 DIAGNOSIS — Z23 Encounter for immunization: Secondary | ICD-10-CM | POA: Diagnosis not present

## 2022-03-28 ENCOUNTER — Encounter: Payer: Self-pay | Admitting: Internal Medicine

## 2022-03-28 ENCOUNTER — Ambulatory Visit: Payer: PPO | Admitting: Internal Medicine

## 2022-03-28 VITALS — BP 126/80 | HR 67 | Ht 74.0 in | Wt 209.0 lb

## 2022-03-28 DIAGNOSIS — K648 Other hemorrhoids: Secondary | ICD-10-CM

## 2022-03-28 NOTE — Progress Notes (Signed)
Andrew Bates is a 76 year old male who returns for additional hemorrhoidal banding for symptomatic internal hemorrhoids.   Up-to-date with colonoscopic surveillance.   Successful banding in 2018 no recurrent prolapsing and bleeding internal hemorrhoids   Feels much better after last banding on 02/18/2022. Bleeding has resolved and minimal prolapse at this point.  Bowel movements also much better as he has found the right balance with fiber supplement and MiraLAX   PROCEDURE NOTE:  The patient presents with symptomatic grade 2 to 3 internal hemorrhoids, requesting rubber band ligation of his hemorrhoidal disease.  All risks, benefits and alternative forms of therapy were described and informed consent was obtained.   The anorectum was pre-medicated with 0.125% nitroglycerin ointment The decision was made to band the LL internal hemorrhoid, and the [name] was used to perform band ligation without complication.   Digital anorectal examination was then performed to assure proper positioning of the band, and to adjust the banded tissue as required.  The patient was discharged home without pain or other issues.  Dietary and behavioral recommendations were given and along with follow-up instructions.     The patient will return as needed for follow-up and possible additional banding as required. No complications were encountered and the patient tolerated the procedure well.

## 2022-03-28 NOTE — Patient Instructions (Signed)
Please follow up as needed.  HEMORRHOID BANDING PROCEDURE    FOLLOW-UP CARE   The procedure you have had should have been relatively painless since the banding of the area involved does not have nerve endings and there is no pain sensation.  The rubber band cuts off the blood supply to the hemorrhoid and the band may fall off as soon as 48 hours after the banding (the band may occasionally be seen in the toilet bowl following a bowel movement). You may notice a temporary feeling of fullness in the rectum which should respond adequately to plain Tylenol or Motrin.  Following the banding, avoid strenuous exercise that evening and resume full activity the next day.  A sitz bath (soaking in a warm tub) or bidet is soothing, and can be useful for cleansing the area after bowel movements.     To avoid constipation, take two tablespoons of natural wheat bran, natural oat bran, flax, Benefiber or any over the counter fiber supplement and increase your water intake to 7-8 glasses daily.    Unless you have been prescribed anorectal medication, do not put anything inside your rectum for two weeks: No suppositories, enemas, fingers, etc.  Occasionally, you may have more bleeding than usual after the banding procedure.  This is often from the untreated hemorrhoids rather than the treated one.  Don't be concerned if there is a tablespoon or so of blood.  If there is more blood than this, lie flat with your bottom higher than your head and apply an ice pack to the area. If the bleeding does not stop within a half an hour or if you feel faint, call our office at [phone] or go to the emergency room.  Problems are not common; however, if there is a substantial amount of bleeding, severe pain, chills, fever or difficulty passing urine (very rare) or other problems, you should call us at [phone] or report to the nearest emergency room.  Do not stay seated continuously for more than 2-3 hours for a  day or two after the procedure.  Tighten your buttock muscles 10-15 times every two hours and take 10-15 deep breaths every 1-2 hours.  Do not spend more than a few minutes on the toilet if you cannot empty your bowel; instead re-visit the toilet at a later time.    _______________________________________________________  If you are age 76 or older, your body mass index should be between 23-30. Your Body mass index is 26.83 kg/m. If this is out of the aforementioned range listed, please consider follow up with your Primary Care Provider.  If you are age 44 or younger, your body mass index should be between 19-25. Your Body mass index is 26.83 kg/m. If this is out of the aformentioned range listed, please consider follow up with your Primary Care Provider.   ________________________________________________________  The  GI providers would like to encourage you to use [facility] to communicate with providers for non-urgent requests or questions.  Due to long hold times on the telephone, sending your provider a message by [facility] may be a faster and more efficient way to get a response.  Please allow 48 business hours for a response.  Please remember that this is for non-urgent requests.  _______________________________________________________  Due to recent changes in healthcare laws, you may see the results of your imaging and laboratory studies on MyChart before your provider has had a chance to review them.  We understand that in some cases there  may be results that are confusing or concerning to you. Not all laboratory results come back in the same time frame and the provider may be waiting for multiple results in order to interpret others.  Please give [place] 48 hours in order for your provider to thoroughly review all the results before contacting the office for clarification of your results.

## 2022-04-09 ENCOUNTER — Telehealth: Payer: Self-pay | Admitting: Internal Medicine

## 2022-04-09 NOTE — Telephone Encounter (Signed)
Pt had hem banding done 10/6, pt states it did not work. He reports having to push it out of the way to have stool come out. He is not sure if the band fell off or what happened. Please advise.

## 2022-04-09 NOTE — Telephone Encounter (Signed)
Inbound call from patient spouse stating patient is still having complications. States Hemorid keep falling out. Please advise.  Thank you

## 2022-04-10 NOTE — Telephone Encounter (Signed)
Only spot I see close to that time frame is 04/23/22 at 10:30am. Ok to put banding in this slot? Please advise.

## 2022-04-10 NOTE — Telephone Encounter (Signed)
Sorry to hear, we can try that position (hemorrhoidal) again, would give in 2-3 weeks after last visit and then he can be put in a banding slot

## 2022-04-10 NOTE — Telephone Encounter (Signed)
Pt aware of Dr. Quentin Mulling recommendations. Pt scheduled for hem banding 04/23/22 at 10:30am. Pt aware of appt.

## 2022-04-10 NOTE — Telephone Encounter (Signed)
yes

## 2022-04-23 ENCOUNTER — Ambulatory Visit (INDEPENDENT_AMBULATORY_CARE_PROVIDER_SITE_OTHER): Payer: PPO | Admitting: Internal Medicine

## 2022-04-23 ENCOUNTER — Encounter: Payer: Self-pay | Admitting: Internal Medicine

## 2022-04-23 VITALS — BP 140/80 | HR 59 | Ht 74.0 in | Wt 210.5 lb

## 2022-04-23 DIAGNOSIS — K648 Other hemorrhoids: Secondary | ICD-10-CM | POA: Diagnosis not present

## 2022-04-23 NOTE — Patient Instructions (Addendum)
_______________________________________________________  If you are age 75 or older, your body mass index should be between 23-30. Your Body mass index is 27.03 kg/m. If this is out of the aforementioned range listed, please consider follow up with your Primary Care Provider. ________________________________________________________  The [place] [facility] providers would like to encourage you to use [facility] to communicate with providers for non-urgent requests or questions.  Due to long hold times on the telephone, sending your provider a message by [facility] may be a faster and more efficient way to get a response.  Please allow 48 business hours for a response.  Please remember that this is for non-urgent requests.  _______________________________________________________  Andrew Bates will follow up in our office on an as needed basis.  Thank you for entrusting me with your care and choosing [facility].  Dr Hilarie Fredrickson

## 2022-04-23 NOTE — Progress Notes (Signed)
Mr. Bernhart returns today because he is having additional prolapsing tissue since his last hemorrhoidal banding on 03/28/2022.  He wonders if the last banding was not as successful because of his prolapse symptoms.  Symptoms continue to be prolapsing tissue during defecation and after requiring some manual reduction.  Bleeding has stopped.  Bowel habits continue to be much better with fiber supplement plus MiraLAX.  He is not spending additional time on the toilet sitting down only to defecate and getting up immediately.  He is also avoiding straining.  ANOSCOPY: Using a disposable, lubricated, slotted, self-illuminating anoscope, the rectum was intubated without difficulty. The trochar was removed and the ano-rectum was circumferentially inspected. There were distal internal hemorrhoids, RA and LL. There was no finding of an anorectal fissure. The rectal mucosa was not inflamed. No neoplasia or other pathology was identified. The inspection was well tolerated.    PROCEDURE NOTE:  The patient presents with symptomatic grade 3 internal hemorrhoids, requesting rubber band ligation of his hemorrhoidal disease.  All risks, benefits and alternative forms of therapy were described and informed consent was obtained.   The anorectum was pre-medicated with 0.125% nitroglycerin ointment The decision was made to band the RA (between RA and LL) internal hemorrhoid, and the [place] was used to perform band ligation without complication.   Digital anorectal examination was then performed to assure proper positioning of the band, and to adjust the banded tissue as required.  The patient was discharged home without pain or other issues.  Dietary and behavioral recommendations were given and along with follow-up instructions.     The following adjunctive treatments were recommended: Continue MiraLAX and fiber supplement  The patient will return as needed for follow-up and possible additional banding  as required. No complications were encountered and the patient tolerated the procedure well.

## 2022-05-13 DIAGNOSIS — R42 Dizziness and giddiness: Secondary | ICD-10-CM | POA: Diagnosis not present

## 2022-05-13 DIAGNOSIS — H6122 Impacted cerumen, left ear: Secondary | ICD-10-CM | POA: Diagnosis not present

## 2022-05-13 DIAGNOSIS — H903 Sensorineural hearing loss, bilateral: Secondary | ICD-10-CM | POA: Diagnosis not present

## 2022-05-16 ENCOUNTER — Other Ambulatory Visit: Payer: Self-pay | Admitting: Cardiology

## 2022-06-12 DIAGNOSIS — R42 Dizziness and giddiness: Secondary | ICD-10-CM | POA: Diagnosis not present

## 2022-06-12 DIAGNOSIS — H903 Sensorineural hearing loss, bilateral: Secondary | ICD-10-CM | POA: Diagnosis not present

## 2022-07-18 NOTE — Progress Notes (Unsigned)
Cardiology Office Note  Date: 07/21/2022   ID: Andrew Bates, Andrew Bates [birth year], MRN [id number]  PCP:  Sharilyn Sites, MD  Cardiologist:  Rozann Lesches, MD Electrophysiologist:  None   Chief Complaint  Patient presents with   Cardiac follow-up    History of Present Illness: Andrew Bates is a 77 y.o. male last seen in July 2023.  He is here with his wife for a follow-up visit.  Reports no change in intermittent chest discomfort as described previously.  No palpitations or syncope.  Main limitation is related to lower back and hip pain.  This limits his ambulation.  I reviewed his medications which are stable from a cardiac perspective.  He had lab work in September 2023, LDL was 68 at that time.  I personally reviewed his ECG today which shows sinus rhythm with PVC and fusion beat, borderline increased voltage.  Past Medical History:  Diagnosis Date   Adenomatous colon polyp    Anxiety    Arthritis    Asthma    Coronary artery disease    Nonobstructive at cardiac catheterization 2020   Depression    DVT (deep venous thrombosis) ([place]) 2008   Essential hypertension    GERD (gastroesophageal reflux disease)    History of kidney stones    Hyperlipidemia    Internal hemorrhoids    Peripheral neuropathy    PTSD (post-traumatic stress disorder)    Sleep apnea    cpap   Vertigo     Current Outpatient Medications  Medication Sig Dispense Refill   acetaminophen (TYLENOL) 500 MG tablet Take 500 mg by mouth every 6 (six) hours as needed for fever.     amLODipine (NORVASC) 5 MG tablet Take 1 tablet (5 mg total) by mouth daily. 90 tablet 3   Artificial Tear Ointment (DRY EYES OP) Apply 1 application to eye as needed (dry eyes).      aspirin EC 81 MG EC tablet Take 1 tablet (81 mg total) by mouth daily.     Biotin 5 MG CAPS Take 10 mg by mouth daily.      buPROPion (WELLBUTRIN XL) 150 MG 24 hr tablet Take 150 mg by mouth daily.     busPIRone (BUSPAR) 10 MG tablet Take 10 mg by  mouth 2 (two) times daily. anxiety     diazepam (VALIUM) 10 MG tablet Take 10 mg by mouth 2 (two) times daily as needed for anxiety.     hydrocortisone (ANUSOL-HC) 2.5 % rectal cream Place 1 application  rectally at bedtime. 30 g 1   isosorbide mononitrate (IMDUR) 30 MG 24 hr tablet Take 1 tablet (30 mg total) by mouth daily. 90 tablet 3   losartan (COZAAR) 50 MG tablet TAKE 1 TABLET BY MOUTH TWICE A DAILY. 180 tablet 0   mirtazapine (REMERON) 45 MG tablet Take 45 mg by mouth at bedtime.     nitroGLYCERIN (NITROSTAT) 0.4 MG SL tablet Place 1 tablet (0.4 mg total) under the tongue every 5 (five) minutes x 3 doses as needed for chest pain. 25 tablet 3   omeprazole (PRILOSEC) 40 MG capsule TAKE ONE CAPSULE BY MOUTH ONCE A DAY FOR HEARTBURN (TAKE ON AN EMPTY STOMACH 30 MINUTES PRIOR TO A MEAL)     ondansetron (ZOFRAN ODT) 4 MG disintegrating tablet Take 1 tablet (4 mg total) by mouth every 8 (eight) hours as needed for nausea or vomiting. 20 tablet 0   polyethylene glycol (MIRALAX / GLYCOLAX) 17 g packet Take 17 g by  mouth daily.     rosuvastatin (CRESTOR) 20 MG tablet TAKE 1 TABLET BY MOUTH AT 6PM. 90 tablet 0   tiZANidine (ZANAFLEX) 2 MG tablet Take 2 mg by mouth 3 (three) times daily.     Wheat Dextrin (BENEFIBER DRINK MIX PO) Take by mouth.     No current facility-administered medications for this visit.   Allergies:  Other, Tamsulosin, Codeine, Morphine and related, Metoprolol, Oxybutynin, Pentazocine-naloxone hcl, Sertraline, and Topiramate   ROS: No syncope.  Physical Exam: VS:  BP 138/80   Pulse 76   Ht '6\' 2"'$  (1.88 m)   Wt 214 lb (97.1 kg)   SpO2 93%   BMI 27.48 kg/m , BMI Body mass index is 27.48 kg/m.  Wt Readings from Last 3 Encounters:  07/21/22 214 lb (97.1 kg)  04/23/22 210 lb 8 oz (95.5 kg)  03/28/22 209 lb (94.8 kg)    General: Patient appears comfortable at rest. HEENT: Conjunctiva and lids normal. Neck: Supple, no elevated JVP or carotid bruits. Lungs: Clear to  auscultation, nonlabored breathing at rest. Cardiac: Regular rate and rhythm with ectopy, no S3 or significant systolic murmur. Abdomen: Soft, bowel sounds present.  ECG:  An ECG dated 06/25/2021 was personally reviewed today and demonstrated:  Sinus bradycardia.  Recent Labwork:  September 2023: Hemoglobin 15.5, platelets 297, BUN 13, creatinine 1.29, potassium 4.6, AST 22, ALT 14, cholesterol 134, triglycerides 120, HDL 44, LDL 68, TSH 1.14  Other Studies Reviewed Today:  Cardiac catheterization 06/25/2021:   Prox LAD lesion is 50% stenosed. - RFR 0.98   Ost LAD to Prox LAD lesion is 30% stenosed.   Dist LM to Ost LAD lesion is 50% stenosed. - RFR 0.92-0.93 9NOT SIGNIFICANT)   Prox Cx lesion is 20% stenosed.   LV end diastolic pressure is normal.   There is no aortic valve stenosis.   Stable ostial and mid LAD disease-RFR nonischemic. Consider either noncardiac cause of chest pain versus coronary spasm.  Assessment and Plan:  1.  Mild to moderate nonobstructive CAD most recently evaluated at cardiac catheterization in January 2023.  Reports no accelerating symptoms, I reviewed his ECG.  Plan to continue medical therapy and observation.  Currently on aspirin, Norvasc, Imdur, Cozaar, Crestor, and as needed nitroglycerin.  2.  Mixed hyperlipidemia on Crestor with most recent LDL 68.  Medication Adjustments/Labs and Tests Ordered: Current medicines are reviewed at length with the patient today.  Concerns regarding medicines are outlined above.   Tests Ordered: Orders Placed This Encounter  Procedures   EKG 12-Lead    Medication Changes: No orders of the defined types were placed in this encounter.   Disposition:  Follow up  6 months.  Signed, Satira Sark, MD, [facility] 07/21/2022 8:46 AM    [facility] [place]. [address], [place], [place] [address] Phone: [phone]; Fax: [phone]

## 2022-07-21 ENCOUNTER — Other Ambulatory Visit: Payer: Self-pay

## 2022-07-21 ENCOUNTER — Ambulatory Visit: Payer: PPO | Attending: Cardiology | Admitting: Cardiology

## 2022-07-21 ENCOUNTER — Encounter: Payer: Self-pay | Admitting: Cardiology

## 2022-07-21 VITALS — BP 138/80 | HR 76 | Ht 74.0 in | Wt 214.0 lb

## 2022-07-21 DIAGNOSIS — I25119 Atherosclerotic heart disease of native coronary artery with unspecified angina pectoris: Secondary | ICD-10-CM

## 2022-07-21 DIAGNOSIS — D225 Melanocytic nevi of trunk: Secondary | ICD-10-CM | POA: Diagnosis not present

## 2022-07-21 DIAGNOSIS — E782 Mixed hyperlipidemia: Secondary | ICD-10-CM

## 2022-07-21 DIAGNOSIS — L821 Other seborrheic keratosis: Secondary | ICD-10-CM | POA: Diagnosis not present

## 2022-07-21 MED ORDER — NITROGLYCERIN 0.4 MG SL SUBL: 0.4000 mg | SUBLINGUAL_TABLET | SUBLINGUAL | 3 refills | Status: DC | PRN

## 2022-07-21 NOTE — Patient Instructions (Signed)
Medication Instructions:  Your physician recommends that you continue on your current medications as directed. Please refer to the Current Medication list given to you today.   Labwork: None today  Testing/Procedures: None today  Follow-Up: 6 months  Any Other Special Instructions Will Be Listed Below (If Applicable).  If you need a refill on your cardiac medications before your next appointment, please call your pharmacy.  

## 2022-07-23 DIAGNOSIS — H25811 Combined forms of age-related cataract, right eye: Secondary | ICD-10-CM | POA: Diagnosis not present

## 2022-07-28 DIAGNOSIS — H43811 Vitreous degeneration, right eye: Secondary | ICD-10-CM | POA: Diagnosis not present

## 2022-07-28 DIAGNOSIS — H25811 Combined forms of age-related cataract, right eye: Secondary | ICD-10-CM | POA: Diagnosis not present

## 2022-07-28 DIAGNOSIS — H18413 Arcus senilis, bilateral: Secondary | ICD-10-CM | POA: Diagnosis not present

## 2022-07-28 DIAGNOSIS — H2512 Age-related nuclear cataract, left eye: Secondary | ICD-10-CM | POA: Diagnosis not present

## 2022-07-31 DIAGNOSIS — H25811 Combined forms of age-related cataract, right eye: Secondary | ICD-10-CM | POA: Diagnosis not present

## 2022-08-01 ENCOUNTER — Encounter (HOSPITAL_COMMUNITY): Payer: Self-pay

## 2022-08-01 ENCOUNTER — Encounter (HOSPITAL_COMMUNITY)
Admission: RE | Admit: 2022-08-01 | Discharge: 2022-08-01 | Disposition: A | Discharge status: Home / Self Care | Payer: PPO | Source: Ambulatory Visit | Attending: Ophthalmology | Admitting: Ophthalmology

## 2022-08-01 NOTE — H&P (Signed)
Surgical History & Physical  Patient Name: Andrew Bates DOB: Jan 15, 1946  Surgery: Cataract extraction with intraocular lens implant phacoemulsification; Right Eye  Surgeon: Baruch Goldmann MD Surgery Date:  08-08-22 Pre-Op Date:  07-28-22  HPI: A 39 Yr. old male patient is referred by Dr Jorja Loa for cataract eval. 1. The patient complains of nighttime light - car headlights, street lamps etc. glare causing poor vision, which began 1 year ago. Both eyes are affected. The episode is gradual. The condition's severity increased since last visit. Symptoms occur when the patient is driving and outside. The complaint is associated with glare. This is negatively affecting the patient's quality of life and the patient is unable to function adequately in life with the current level of vision. HPI was performed by Baruch Goldmann .  Medical History: Cataracts Corneal arcus, Hypertensive retinopathy, floaters Acid reflux, Bulging disc, vertigo Heart Problem High Blood Pressure LDL  Review of Systems Negative Allergic/Immunologic Negative Cardiovascular Negative Constitutional Negative Ear, Nose, Mouth & Throat Negative Endocrine Negative Eyes Negative Gastrointestinal Negative Genitourinary Negative Hemotologic/Lymphatic Negative Integumentary Negative Musculoskeletal Negative Neurological Negative Psychiatry Negative Respiratory  Social   Former smoker   Medication Gabapentin, Meclizine, Omeprazole, Simvastatin,   Sx/Procedures Back Surgery, Wrist Sx, Knee Surgery, Blood clot sx,   Drug Allergies  Naldahol, Codeine, Morphine, Oxybutin,   History & Physical: Heent: cataract, right eye NECK: supple without bruits LUNGS: lungs clear to auscultation CV: regular rate and rhythm Abdomen: soft and non-tender Impression & Plan: Assessment: 1.  COMBINED FORMS AGE RELATED CATARACT; Right Eye (H25.811) 2.  NUCLEAR SCLEROSIS AGE RELATED; Left Eye (H25.12) 3.  Pinguecula; Both Eyes  (H11.153) 4.  ARCUS SENILIS; Both Eyes (H18.413) 5.  VITREOUS DETACHMENT PVD; Right Eye [phone])  Plan: 1.  Cataract accounts for the patient's decreased vision. This visual impairment is not correctable with a tolerable change in glasses or contact lenses. Cataract surgery with an implantation of a new lens should significantly improve the visual and functional status of the patient. Discussed all risks, benefits, alternatives, and potential complications. Discussed the procedures and recovery. Patient desires to have surgery. A-scan ordered and performed today for intra-ocular lens calculations. The surgery will be performed in order to improve vision for driving, reading, and for eye examinations. Recommend phacoemulsification with intra-ocular lens. Recommend Dextenza for post-operative pain and inflammation. Right Eye. Dilates poorly - shugarcaine by protocol. Malyugin Ring. Omidira. [employer] - Veteran, waive surgeon's fee.  2.  Will address OS after right eye.  3.  Observe; Artificial tears as needed for irritation.  4.  Discussed significance of finding Answered patient questions about finding  5.  Asymptomatic. RD precautions given. Patient to call with increase in flashing lights/floaters/dark curtain.

## 2022-08-08 ENCOUNTER — Ambulatory Visit (HOSPITAL_COMMUNITY): Payer: PPO | Admitting: Certified Registered Nurse Anesthetist

## 2022-08-08 ENCOUNTER — Encounter (HOSPITAL_COMMUNITY)
Admission: RE | Disposition: A | Discharge status: Home / Self Care | Payer: Self-pay | Source: Home / Self Care | Attending: Ophthalmology

## 2022-08-08 ENCOUNTER — Ambulatory Visit (HOSPITAL_BASED_OUTPATIENT_CLINIC_OR_DEPARTMENT_OTHER): Payer: PPO | Admitting: Certified Registered Nurse Anesthetist

## 2022-08-08 ENCOUNTER — Ambulatory Visit (HOSPITAL_COMMUNITY)
Admission: RE | Admit: 2022-08-08 | Discharge: 2022-08-08 | Disposition: A | Discharge status: Home / Self Care | Payer: PPO | Attending: Ophthalmology | Admitting: Ophthalmology

## 2022-08-08 DIAGNOSIS — I25119 Atherosclerotic heart disease of native coronary artery with unspecified angina pectoris: Secondary | ICD-10-CM

## 2022-08-08 DIAGNOSIS — K219 Gastro-esophageal reflux disease without esophagitis: Secondary | ICD-10-CM | POA: Diagnosis not present

## 2022-08-08 DIAGNOSIS — I10 Essential (primary) hypertension: Secondary | ICD-10-CM

## 2022-08-08 DIAGNOSIS — J45909 Unspecified asthma, uncomplicated: Secondary | ICD-10-CM | POA: Diagnosis not present

## 2022-08-08 DIAGNOSIS — Z87891 Personal history of nicotine dependence: Secondary | ICD-10-CM

## 2022-08-08 DIAGNOSIS — G4730 Sleep apnea, unspecified: Secondary | ICD-10-CM | POA: Diagnosis not present

## 2022-08-08 DIAGNOSIS — H18413 Arcus senilis, bilateral: Secondary | ICD-10-CM | POA: Insufficient documentation

## 2022-08-08 DIAGNOSIS — H25811 Combined forms of age-related cataract, right eye: Secondary | ICD-10-CM

## 2022-08-08 DIAGNOSIS — I2510 Atherosclerotic heart disease of native coronary artery without angina pectoris: Secondary | ICD-10-CM | POA: Insufficient documentation

## 2022-08-08 DIAGNOSIS — H11153 Pinguecula, bilateral: Secondary | ICD-10-CM | POA: Insufficient documentation

## 2022-08-08 DIAGNOSIS — H43811 Vitreous degeneration, right eye: Secondary | ICD-10-CM | POA: Diagnosis not present

## 2022-08-08 HISTORY — PX: CATARACT EXTRACTION W/PHACO: SHX586

## 2022-08-08 SURGERY — PHACOEMULSIFICATION, CATARACT, WITH IOL INSERTION
Anesthesia: Monitor Anesthesia Care | Site: Eye | Laterality: Right

## 2022-08-08 MED ORDER — MOXIFLOXACIN HCL 0.5 % OP SOLN
OPHTHALMIC | Status: DC | PRN
  Administered 2022-08-08: .2 mL via OPHTHALMIC

## 2022-08-08 MED ORDER — EPINEPHRINE PF 1 MG/ML IJ SOLN
INTRAMUSCULAR | Status: AC
  Filled 2022-08-08: qty 1

## 2022-08-08 MED ORDER — EPINEPHRINE PF 1 MG/ML IJ SOLN
INTRAOCULAR | Status: DC | PRN
  Administered 2022-08-08: 500 mL

## 2022-08-08 MED ORDER — SODIUM HYALURONATE 10 MG/ML IO SOLUTION
PREFILLED_SYRINGE | INTRAOCULAR | Status: DC | PRN
  Administered 2022-08-08: .85 mL via INTRAOCULAR

## 2022-08-08 MED ORDER — POVIDONE-IODINE 5 % OP SOLN
OPHTHALMIC | Status: DC | PRN
  Administered 2022-08-08: 1 via OPHTHALMIC

## 2022-08-08 MED ORDER — TROPICAMIDE 1 % OP SOLN
1.0000 [drp] | OPHTHALMIC | Status: AC | PRN
  Administered 2022-08-08 (×3): 1 [drp] via OPHTHALMIC

## 2022-08-08 MED ORDER — LIDOCAINE HCL (PF) 1 % IJ SOLN
INTRAOCULAR | Status: DC | PRN
  Administered 2022-08-08: 1 mL via OPHTHALMIC

## 2022-08-08 MED ORDER — BSS IO SOLN
INTRAOCULAR | Status: DC | PRN
  Administered 2022-08-08: 15 mL via INTRAOCULAR

## 2022-08-08 MED ORDER — TETRACAINE HCL 0.5 % OP SOLN
1.0000 [drp] | OPHTHALMIC | Status: AC | PRN
  Administered 2022-08-08 (×3): 1 [drp] via OPHTHALMIC

## 2022-08-08 MED ORDER — MIDAZOLAM HCL 5 MG/5ML IJ SOLN
INTRAMUSCULAR | Status: DC | PRN
  Administered 2022-08-08: 1 mg via INTRAVENOUS

## 2022-08-08 MED ORDER — MOXIFLOXACIN HCL 5 MG/ML IO SOLN
INTRAOCULAR | Status: AC
  Filled 2022-08-08: qty 1

## 2022-08-08 MED ORDER — PHENYLEPHRINE HCL 2.5 % OP SOLN
1.0000 [drp] | OPHTHALMIC | Status: AC | PRN
  Administered 2022-08-08 (×3): 1 [drp] via OPHTHALMIC

## 2022-08-08 MED ORDER — PHENYLEPHRINE-KETOROLAC 1-0.3 % IO SOLN
INTRAOCULAR | Status: AC
  Filled 2022-08-08: qty 4

## 2022-08-08 MED ORDER — STERILE WATER FOR IRRIGATION IR SOLN
Status: DC | PRN
  Administered 2022-08-08: 250 mL

## 2022-08-08 MED ORDER — LIDOCAINE HCL 3.5 % OP GEL
1.0000 | Freq: Once | OPHTHALMIC | Status: AC
  Administered 2022-08-08: 1 via OPHTHALMIC

## 2022-08-08 MED ORDER — MIDAZOLAM HCL 2 MG/2ML IJ SOLN
INTRAMUSCULAR | Status: AC
  Filled 2022-08-08: qty 2

## 2022-08-08 MED ORDER — SODIUM HYALURONATE 23MG/ML IO SOSY
PREFILLED_SYRINGE | INTRAOCULAR | Status: DC | PRN
  Administered 2022-08-08: .6 mL via INTRAOCULAR

## 2022-08-08 SURGICAL SUPPLY — 16 items
CATARACT SUITE SIGHTPATH (MISCELLANEOUS) ×1 IMPLANT
CLOTH BEACON ORANGE TIMEOUT ST (SAFETY) ×2 IMPLANT
EYE SHIELD UNIVERSAL CLEAR (GAUZE/BANDAGES/DRESSINGS) IMPLANT
FEE CATARACT SUITE SIGHTPATH (MISCELLANEOUS) ×2 IMPLANT
GLOVE BIOGEL PI IND STRL 6.5 (GLOVE) IMPLANT
GLOVE BIOGEL PI IND STRL 7.0 (GLOVE) ×4 IMPLANT
KIT SLEEVE INFUSION .9 MICRO (MISCELLANEOUS) IMPLANT
LENS IOL RAYNER 17.5 (Intraocular Lens) ×1 IMPLANT
LENS IOL RAYONE EMV 17.5 (Intraocular Lens) IMPLANT
NDL HYPO 18GX1.5 BLUNT FILL (NEEDLE) ×2 IMPLANT
NEEDLE HYPO 18GX1.5 BLUNT FILL (NEEDLE) ×1 IMPLANT
PAD ARMBOARD 7.5X6 YLW CONV (MISCELLANEOUS) ×2 IMPLANT
SYR TB 1ML LL NO SAFETY (SYRINGE) ×2 IMPLANT
TAPE SURG TRANSPORE 1 IN (GAUZE/BANDAGES/DRESSINGS) IMPLANT
TAPE SURGICAL TRANSPORE 1 IN (GAUZE/BANDAGES/DRESSINGS) ×1
WATER STERILE IRR 250ML POUR (IV SOLUTION) ×2 IMPLANT

## 2022-08-08 NOTE — Op Note (Signed)
Date of procedure: 08/08/22  Pre-operative diagnosis:  Visually significant combined form age-related cataract, Right Eye (H25.811)  Post-operative diagnosis:  Visually significant combined form age-related cataract, Right Eye (H25.811)  Procedure: Removal of cataract via phacoemulsification and insertion of intra-ocular lens Rayner [id number] +17.5D into the capsular bag of the Right Eye  Attending surgeon: Gerda Diss. Chiante Peden, MD, MA  Anesthesia: MAC, Topical Akten  Complications: None  Estimated Blood Loss: <20m (minimal)  Specimens: None  Implants: As above  Indications:  Visually significant age-related cataract, Right Eye  Procedure:  The patient was seen and identified in the pre-operative area. The operative eye was identified and dilated.  The operative eye was marked.  Topical anesthesia was administered to the operative eye.     The patient was then to the operative suite and placed in the supine position.  A timeout was performed confirming the patient, procedure to be performed, and all other relevant information.   The patient's face was prepped and draped in the usual fashion for intra-ocular surgery.  A lid speculum was placed into the operative eye and the surgical microscope moved into place and focused.  A superotemporal paracentesis was created using a 20 gauge paracentesis blade.  Shugarcaine was injected into the anterior chamber.  Viscoelastic was injected into the anterior chamber.  A temporal clear-corneal main wound incision was created using a 2.458mmicrokeratome.  A continuous curvilinear capsulorrhexis was initiated using an irrigating cystitome and completed using capsulorrhexis forceps.  Hydrodissection and hydrodeliniation were performed.  Viscoelastic was injected into the anterior chamber.  A phacoemulsification handpiece and a chopper as a second instrument were used to remove the nucleus and epinucleus. The irrigation/aspiration handpiece was used to remove any  remaining cortical material.   The capsular bag was reinflated with viscoelastic, checked, and found to be intact.  The intraocular lens was inserted into the capsular bag.  The irrigation/aspiration handpiece was used to remove any remaining viscoelastic.  The clear corneal wound and paracentesis wounds were then hydrated and checked with Weck-Cels to be watertight. 0.69m64mf moxifloxacin was injected into the anterior chamber. The lid-speculum was removed.  The drape was removed.  The patient's face was cleaned with a wet and dry 4x4. A clear shield was taped over the eye. The patient was taken to the post-operative care unit in good condition, having tolerated the procedure well.  Post-Op Instructions: [facility] evaluation and will receive all other orders and instructions.

## 2022-08-08 NOTE — Transfer of Care (Signed)
Immediate Anesthesia Transfer of Care Note  Patient: Andrew Bates  Procedure(s) Performed: CATARACT EXTRACTION PHACO AND INTRAOCULAR LENS PLACEMENT (IOC) (Right: Eye)  Patient Location: PACU  Anesthesia Type:MAC  Level of Consciousness: awake, alert , and oriented  Airway & Oxygen Therapy: Patient Spontanous Breathing  Post-op Assessment: Report given to RN and Post -op Vital signs reviewed and stable  Post vital signs: Reviewed and stable  Last Vitals:  Vitals Value Taken Time  BP    Temp    Pulse    Resp    SpO2      Last Pain:  Vitals:   08/08/22 0811  PainSc: 0-No pain         Complications: No notable events documented.

## 2022-08-08 NOTE — Anesthesia Preprocedure Evaluation (Signed)
Anesthesia Evaluation  Patient identified by MRN, date of birth, ID band Patient awake    Reviewed: Allergy & Precautions, H&P , NPO status , Patient's Chart, lab work & pertinent test results, reviewed documented beta blocker date and time   History of Anesthesia Complications (+) PONV and history of anesthetic complications  Airway Mallampati: II  TM Distance: >3 FB Neck ROM: full    Dental no notable dental hx.    Pulmonary neg pulmonary ROS, asthma , sleep apnea , former smoker   Pulmonary exam normal breath sounds clear to auscultation       Cardiovascular Exercise Tolerance: Good hypertension, + angina  + CAD  negative cardio ROS  Rhythm:regular Rate:Normal     Neuro/Psych  PSYCHIATRIC DISORDERS Anxiety Depression     Neuromuscular disease negative neurological ROS  negative psych ROS   GI/Hepatic negative GI ROS, Neg liver ROS,GERD  ,,  Endo/Other  negative endocrine ROS    Renal/GU Renal diseasenegative Renal ROS  negative genitourinary   Musculoskeletal   Abdominal   Peds  Hematology negative hematology ROS (+)   Anesthesia Other Findings   Reproductive/Obstetrics negative OB ROS                             Anesthesia Physical Anesthesia Plan  ASA: 2  Anesthesia Plan: MAC   Post-op Pain Management:    Induction:   PONV Risk Score and Plan:   Airway Management Planned:   Additional Equipment:   Intra-op Plan:   Post-operative Plan:   Informed Consent: I have reviewed the patients History and Physical, chart, labs and discussed the procedure including the risks, benefits and alternatives for the proposed anesthesia with the patient or authorized representative who has indicated his/her understanding and acceptance.     Dental Advisory Given  Plan Discussed with: CRNA  Anesthesia Plan Comments:        Anesthesia Quick Evaluation

## 2022-08-08 NOTE — Interval H&P Note (Signed)
History and Physical Interval Note:  08/08/2022 8:46 AM  Andrew Bates  has presented today for surgery, with the diagnosis of combined forms age related cataract; right.  The various methods of treatment have been discussed with the patient and family. After consideration of risks, benefits and other options for treatment, the patient has consented to  Procedure(s) with comments: CATARACT EXTRACTION PHACO AND INTRAOCULAR LENS PLACEMENT ([place]) (Right) - CDE as a surgical intervention.  The patient's history has been reviewed, patient examined, no change in status, stable for surgery.  I have reviewed the patient's chart and labs.  Questions were answered to the patient's satisfaction.     Baruch Goldmann

## 2022-08-08 NOTE — Discharge Instructions (Signed)
Please discharge patient when stable, will follow up today with Dr. Saren Corkern at the [facility] office immediately following discharge.  Leave shield in place until visit.  All paperwork with discharge instructions will be given at the office.  [facility] Address:  [address]  [place], [place] [address]  

## 2022-08-09 NOTE — Anesthesia Postprocedure Evaluation (Signed)
Anesthesia Post Note  Patient: Andrew Bates  Procedure(s) Performed: CATARACT EXTRACTION PHACO AND INTRAOCULAR LENS PLACEMENT (IOC) (Right: Eye)  Patient location during evaluation: Phase II Anesthesia Type: MAC Level of consciousness: awake Pain management: pain level controlled Vital Signs Assessment: post-procedure vital signs reviewed and stable Respiratory status: spontaneous breathing and respiratory function stable Cardiovascular status: blood pressure returned to baseline and stable Postop Assessment: no headache and no apparent nausea or vomiting Anesthetic complications: no Comments: Late entry   No notable events documented.   Last Vitals:  Vitals:   08/08/22 0815 08/08/22 0913  BP: (!) 160/84 138/88  Pulse: (!) 53 (!) 53  Resp: 17 18  Temp: 36.7 C 36.7 C  SpO2: 96% 95%    Last Pain:  Vitals:   08/08/22 0913  TempSrc: Oral  PainSc: 0-No pain                 Louann Sjogren

## 2022-08-14 ENCOUNTER — Encounter (HOSPITAL_COMMUNITY): Payer: Self-pay | Admitting: Ophthalmology

## 2022-08-14 DIAGNOSIS — H25812 Combined forms of age-related cataract, left eye: Secondary | ICD-10-CM | POA: Diagnosis not present

## 2022-08-18 ENCOUNTER — Other Ambulatory Visit: Payer: Self-pay | Admitting: Cardiology

## 2022-08-19 ENCOUNTER — Encounter (HOSPITAL_COMMUNITY): Payer: Self-pay

## 2022-08-19 ENCOUNTER — Encounter (HOSPITAL_COMMUNITY)
Admission: RE | Admit: 2022-08-19 | Discharge: 2022-08-19 | Disposition: A | Discharge status: Home / Self Care | Payer: PPO | Source: Ambulatory Visit | Attending: Ophthalmology | Admitting: Ophthalmology

## 2022-08-19 NOTE — Pre-Procedure Instructions (Signed)
Attempted pre-op phone call. Left VM for him to call us back. 

## 2022-08-19 NOTE — H&P (Signed)
Surgical History & Physical  Patient Name: Andrew Bates DOB: 1945/11/26  Surgery: Cataract extraction with intraocular lens implant phacoemulsification; Left Eye  Surgeon: Baruch Goldmann MD Surgery Date:  08-22-22 Pre-Op Date:  08-14-22  HPI: A 38 Yr. old male patient 1. The patient is returning after cataract post-op. The right eye is affected. Status post cataract post-op, 1 week ago: Since the last visit, the affected area is doing well. The patient's vision is stable. Patient is following postop medication instructions. HPI was performed by Baruch Goldmann .  Medical History: Cataracts Corneal arcus, Hypertensive retinopathy, floaters Acid reflux, Bulging disc, vertigo Heart Problem High Blood Pressure LDL  Review of Systems Negative Allergic/Immunologic Negative Cardiovascular Negative Constitutional Negative Ear, Nose, Mouth & Throat Negative Endocrine Negative Eyes Negative Gastrointestinal Negative Genitourinary Negative Hemotologic/Lymphatic Negative Integumentary Negative Musculoskeletal Negative Neurological Negative Psychiatry Negative Respiratory  Social   Former smoker   Medication Prednisolone-Moxifloxacin-Bromfenac,  Gabapentin, Meclizine, Omeprazole, Simvastatin,   Sx/Procedures Phaco c IOL OD,  Back Surgery, Wrist Sx, Knee Surgery, Blood clot sx,   Drug Allergies  Naldahol, Codeine, Morphine, Oxybutin,   History & Physical: Heent: cataract, left eye NECK: supple without bruits LUNGS: lungs clear to auscultation CV: regular rate and rhythm Abdomen: soft and non-tender Impression & Plan: Assessment: 1.  CATARACT EXTRACTION STATUS; Right Eye (Z98.41) 2.  INTRAOCULAR LENS IOL ; Right Eye (Z96.1) 3.  NUCLEAR SCLEROSIS AGE RELATED; Left Eye (H25.12)  Plan: 1.  1 week after cataract surgery. Doing well with improved vision and normal eye pressure. Call with any problems or concerns. Continue Pred-Moxi-Brom 2x/day for 3 more weeks.  2.  Doing  well since surgery  3.  Cataract accounts for the patient's decreased vision. This visual impairment is not correctable with a tolerable change in glasses or contact lenses. Cataract surgery with an implantation of a new lens should significantly improve the visual and functional status of the patient. Discussed all risks, benefits, alternatives, and potential complications. Discussed the procedures and recovery. Patient desires to have surgery. A-scan ordered and performed today for intra-ocular lens calculations. The surgery will be performed in order to improve vision for driving, reading, and for eye examinations. Recommend phacoemulsification with intra-ocular lens. Recommend Dextenza for post-operative pain and inflammation. Left Eye. Surgery required to correct imbalance of vision. Dilates well - shugarcaine by protocol.

## 2022-08-20 ENCOUNTER — Other Ambulatory Visit: Payer: Self-pay | Admitting: Cardiology

## 2022-08-22 ENCOUNTER — Ambulatory Visit (HOSPITAL_COMMUNITY)
Admission: RE | Admit: 2022-08-22 | Discharge: 2022-08-22 | Disposition: A | Discharge status: Home / Self Care | Payer: PPO | Attending: Ophthalmology | Admitting: Ophthalmology

## 2022-08-22 ENCOUNTER — Encounter (HOSPITAL_COMMUNITY): Payer: Self-pay | Admitting: Ophthalmology

## 2022-08-22 ENCOUNTER — Encounter (HOSPITAL_COMMUNITY)
Admission: RE | Disposition: A | Discharge status: Home / Self Care | Payer: Self-pay | Source: Home / Self Care | Attending: Ophthalmology

## 2022-08-22 ENCOUNTER — Ambulatory Visit (HOSPITAL_COMMUNITY): Payer: PPO | Admitting: Anesthesiology

## 2022-08-22 ENCOUNTER — Ambulatory Visit (HOSPITAL_BASED_OUTPATIENT_CLINIC_OR_DEPARTMENT_OTHER): Payer: PPO | Admitting: Anesthesiology

## 2022-08-22 DIAGNOSIS — I10 Essential (primary) hypertension: Secondary | ICD-10-CM | POA: Diagnosis not present

## 2022-08-22 DIAGNOSIS — Z87891 Personal history of nicotine dependence: Secondary | ICD-10-CM | POA: Insufficient documentation

## 2022-08-22 DIAGNOSIS — Z9841 Cataract extraction status, right eye: Secondary | ICD-10-CM | POA: Insufficient documentation

## 2022-08-22 DIAGNOSIS — Z961 Presence of intraocular lens: Secondary | ICD-10-CM | POA: Diagnosis not present

## 2022-08-22 DIAGNOSIS — I25119 Atherosclerotic heart disease of native coronary artery with unspecified angina pectoris: Secondary | ICD-10-CM

## 2022-08-22 DIAGNOSIS — G4730 Sleep apnea, unspecified: Secondary | ICD-10-CM | POA: Diagnosis not present

## 2022-08-22 DIAGNOSIS — I2510 Atherosclerotic heart disease of native coronary artery without angina pectoris: Secondary | ICD-10-CM | POA: Insufficient documentation

## 2022-08-22 DIAGNOSIS — H25812 Combined forms of age-related cataract, left eye: Secondary | ICD-10-CM | POA: Insufficient documentation

## 2022-08-22 HISTORY — PX: CATARACT EXTRACTION W/PHACO: SHX586

## 2022-08-22 SURGERY — PHACOEMULSIFICATION, CATARACT, WITH IOL INSERTION
Anesthesia: Monitor Anesthesia Care | Site: Eye | Laterality: Left

## 2022-08-22 MED ORDER — NEOMYCIN-POLYMYXIN-DEXAMETH 3.5-10000-0.1 OP SUSP
OPHTHALMIC | Status: DC | PRN
  Administered 2022-08-22: 2 [drp] via OPHTHALMIC

## 2022-08-22 MED ORDER — PHENYLEPHRINE HCL 2.5 % OP SOLN
1.0000 [drp] | OPHTHALMIC | Status: AC | PRN
  Administered 2022-08-22 (×3): 1 [drp] via OPHTHALMIC

## 2022-08-22 MED ORDER — SODIUM HYALURONATE 10 MG/ML IO SOLUTION
PREFILLED_SYRINGE | INTRAOCULAR | Status: DC | PRN
  Administered 2022-08-22: .85 mL via INTRAOCULAR

## 2022-08-22 MED ORDER — TETRACAINE HCL 0.5 % OP SOLN
1.0000 [drp] | OPHTHALMIC | Status: AC | PRN
  Administered 2022-08-22 (×3): 1 [drp] via OPHTHALMIC

## 2022-08-22 MED ORDER — MIDAZOLAM HCL 2 MG/2ML IJ SOLN
INTRAMUSCULAR | Status: AC
  Filled 2022-08-22: qty 2

## 2022-08-22 MED ORDER — STERILE WATER FOR IRRIGATION IR SOLN
Status: DC | PRN
  Administered 2022-08-22: 250 mL

## 2022-08-22 MED ORDER — EPINEPHRINE PF 1 MG/ML IJ SOLN
INTRAMUSCULAR | Status: AC
  Filled 2022-08-22: qty 2

## 2022-08-22 MED ORDER — SODIUM HYALURONATE 23MG/ML IO SOSY
PREFILLED_SYRINGE | INTRAOCULAR | Status: DC | PRN
  Administered 2022-08-22: .6 mL via INTRAOCULAR

## 2022-08-22 MED ORDER — EPINEPHRINE PF 1 MG/ML IJ SOLN
INTRAOCULAR | Status: DC | PRN
  Administered 2022-08-22: 500 mL

## 2022-08-22 MED ORDER — LIDOCAINE HCL 3.5 % OP GEL
1.0000 | Freq: Once | OPHTHALMIC | Status: AC
  Administered 2022-08-22: 1 via OPHTHALMIC

## 2022-08-22 MED ORDER — MIDAZOLAM HCL 5 MG/5ML IJ SOLN
INTRAMUSCULAR | Status: DC | PRN
  Administered 2022-08-22: 1 mg via INTRAVENOUS

## 2022-08-22 MED ORDER — TROPICAMIDE 1 % OP SOLN
1.0000 [drp] | OPHTHALMIC | Status: AC | PRN
  Administered 2022-08-22 (×3): 1 [drp] via OPHTHALMIC

## 2022-08-22 MED ORDER — BSS IO SOLN
INTRAOCULAR | Status: DC | PRN
  Administered 2022-08-22: 15 mL via INTRAOCULAR

## 2022-08-22 MED ORDER — POVIDONE-IODINE 5 % OP SOLN
OPHTHALMIC | Status: DC | PRN
  Administered 2022-08-22: 1 via OPHTHALMIC

## 2022-08-22 MED ORDER — LIDOCAINE HCL (PF) 1 % IJ SOLN
INTRAOCULAR | Status: DC | PRN
  Administered 2022-08-22: 1 mL via OPHTHALMIC

## 2022-08-22 SURGICAL SUPPLY — 14 items
CATARACT SUITE SIGHTPATH (MISCELLANEOUS) ×1 IMPLANT
CLOTH BEACON ORANGE TIMEOUT ST (SAFETY) ×2 IMPLANT
EYE SHIELD UNIVERSAL CLEAR (GAUZE/BANDAGES/DRESSINGS) IMPLANT
FEE CATARACT SUITE SIGHTPATH (MISCELLANEOUS) ×2 IMPLANT
GLOVE BIOGEL PI IND STRL 7.0 (GLOVE) ×4 IMPLANT
LENS IOL RAYNER 18.5 (Intraocular Lens) ×1 IMPLANT
LENS IOL RAYONE EMV 18.5 (Intraocular Lens) IMPLANT
NDL HYPO 18GX1.5 BLUNT FILL (NEEDLE) ×2 IMPLANT
NEEDLE HYPO 18GX1.5 BLUNT FILL (NEEDLE) ×1 IMPLANT
PAD ARMBOARD 7.5X6 YLW CONV (MISCELLANEOUS) ×2 IMPLANT
SYR TB 1ML LL NO SAFETY (SYRINGE) ×2 IMPLANT
TAPE SURG TRANSPORE 1 IN (GAUZE/BANDAGES/DRESSINGS) IMPLANT
TAPE SURGICAL TRANSPORE 1 IN (GAUZE/BANDAGES/DRESSINGS) ×1
WATER STERILE IRR 250ML POUR (IV SOLUTION) ×2 IMPLANT

## 2022-08-22 NOTE — Op Note (Signed)
Date of procedure: 08/22/22  Pre-operative diagnosis: Visually significant age-related combined cataract, Left Eye (H25.812)  Post-operative diagnosis: Visually significant age-related combined cataract, Left Eye (H25.812)  Procedure: Removal of cataract via phacoemulsification and insertion of intra-ocular lens Rayner [id number] +18.5D into the capsular bag of the Left Eye  Attending surgeon: Gerda Diss. Tavaris Eudy, MD, MA  Anesthesia: MAC, Topical Akten  Complications: None  Estimated Blood Loss: <30m (minimal)  Specimens: None  Implants: As above  Indications:  Visually significant age-related cataract, Left Eye  Procedure:  The patient was seen and identified in the pre-operative area. The operative eye was identified and dilated.  The operative eye was marked.  Topical anesthesia was administered to the operative eye.     The patient was then to the operative suite and placed in the supine position.  A timeout was performed confirming the patient, procedure to be performed, and all other relevant information.   The patient's face was prepped and draped in the usual fashion for intra-ocular surgery.  A lid speculum was placed into the operative eye and the surgical microscope moved into place and focused.  An inferotemporal paracentesis was created using a 20 gauge paracentesis blade.  Shugarcaine was injected into the anterior chamber.  Viscoelastic was injected into the anterior chamber.  A temporal clear-corneal main wound incision was created using a 2.441mmicrokeratome.  A continuous curvilinear capsulorrhexis was initiated using an irrigating cystitome and completed using capsulorrhexis forceps.  Hydrodissection and hydrodeliniation were performed.  Viscoelastic was injected into the anterior chamber.  A phacoemulsification handpiece and a chopper as a second instrument were used to remove the nucleus and epinucleus. The irrigation/aspiration handpiece was used to remove any remaining  cortical material.   The capsular bag was reinflated with viscoelastic, checked, and found to be intact.  The intraocular lens was inserted into the capsular bag.  The irrigation/aspiration handpiece was used to remove any remaining viscoelastic.  The clear corneal wound and paracentesis wounds were then hydrated and checked with Weck-Cels to be watertight. Maxitrol drops were instilled into the operative eye. The lid-speculum was removed.  The drape was removed.  The patient's face was cleaned with a wet and dry 4x4.    A clear shield was taped over the eye. The patient was taken to the post-operative care unit in good condition, having tolerated the procedure well.  Post-Op Instructions: [facility] and will receive all other orders and instructions.

## 2022-08-22 NOTE — Transfer of Care (Signed)
Immediate Anesthesia Transfer of Care Note  Patient: Andrew Bates  Procedure(s) Performed: CATARACT EXTRACTION PHACO AND INTRAOCULAR LENS PLACEMENT (IOC) (Left: Eye)  Patient Location: PACU  Anesthesia Type:MAC  Level of Consciousness: awake, alert , and oriented  Airway & Oxygen Therapy: Patient Spontanous Breathing  Post-op Assessment: Report given to RN and Post -op Vital signs reviewed and stable  Post vital signs: Reviewed and stable  Last Vitals:  Vitals Value Taken Time  BP    Temp    Pulse    Resp    SpO2      Last Pain:  Vitals:   08/22/22 1029  TempSrc: Oral         Complications: No notable events documented.

## 2022-08-22 NOTE — Interval H&P Note (Signed)
History and Physical Interval Note:  08/22/2022 10:54 AM  Andrew Bates  has presented today for surgery, with the diagnosis of combined forms age related cataract; left.  The various methods of treatment have been discussed with the patient and family. After consideration of risks, benefits and other options for treatment, the patient has consented to  Procedure(s): CATARACT EXTRACTION PHACO AND INTRAOCULAR LENS PLACEMENT ([place]) (Left) as a surgical intervention.  The patient's history has been reviewed, patient examined, no change in status, stable for surgery.  I have reviewed the patient's chart and labs.  Questions were answered to the patient's satisfaction.     Baruch Goldmann

## 2022-08-22 NOTE — Discharge Instructions (Signed)
Please discharge patient when stable, will follow up today with Dr. Charlot Gouin at the [facility] [place] office immediately following discharge.  Leave shield in place until visit.  All paperwork with discharge instructions will be given at the office.  [facility] Address:  [address]  [place], [place] [address]  

## 2022-08-22 NOTE — Anesthesia Postprocedure Evaluation (Signed)
Anesthesia Post Note  Patient: Andrew Bates  Procedure(s) Performed: CATARACT EXTRACTION PHACO AND INTRAOCULAR LENS PLACEMENT (IOC) (Left: Eye)  Patient location during evaluation: Phase II Anesthesia Type: MAC Level of consciousness: awake and alert and oriented Pain management: pain level controlled Vital Signs Assessment: post-procedure vital signs reviewed and stable Respiratory status: spontaneous breathing, nonlabored ventilation and respiratory function stable Cardiovascular status: blood pressure returned to baseline and stable Postop Assessment: no apparent nausea or vomiting Anesthetic complications: no  No notable events documented.   Last Vitals:  Vitals:   08/22/22 1029 08/22/22 1122  BP:  139/85  Pulse: (!) 56 (!) 55  Resp: 16 17  Temp: 36.6 C 36.5 C  SpO2:  96%    Last Pain:  Vitals:   08/22/22 1122  TempSrc: Oral  PainSc: 0-No pain                 Levelle Edelen C Wilian Kwong

## 2022-08-22 NOTE — Anesthesia Preprocedure Evaluation (Signed)
Anesthesia Evaluation  Patient identified by MRN, date of birth, ID band Patient awake    Reviewed: Allergy & Precautions, NPO status , Patient's Chart, lab work & pertinent test results  History of Anesthesia Complications (+) PONV and history of anesthetic complications  Airway Mallampati: III  TM Distance: >3 FB Neck ROM: Full   Comment: ACDF Dental  (+) Dental Advisory Given, Missing   Pulmonary asthma , sleep apnea and Continuous Positive Airway Pressure Ventilation , former smoker   Pulmonary exam normal breath sounds clear to auscultation       Cardiovascular Exercise Tolerance: Good hypertension, Pt. on medications + angina  + CAD  Normal cardiovascular exam Rhythm:Regular Rate:Normal   Prox LAD lesion is 50% stenosed. - RFR 0.98   Ost LAD to Prox LAD lesion is 30% stenosed.   Dist LM to Ost LAD lesion is 50% stenosed. - RFR 0.92-0.93 9NOT SIGNIFICANT)   Prox Cx lesion is 20% stenosed.   LV end diastolic pressure is normal.   There is no aortic valve stenosis.    Stable ostial and mid LAD disease-RFR nonischemic.  Consider either noncardiac cause of chest pain versus coronary spasm.     Recommendations:  Monitor this evening post procedure for TR band removal.  Discontinue IV heparin, discontinue IV nitroglycerin-increase amlodipine to 10 mg and add Imdur.  Despite presence of coronary disease, will stop beta-blocker due to bradycardia.     Glenetta Hew, MD    Neuro/Psych  PSYCHIATRIC DISORDERS Anxiety Depression     Neuromuscular disease    GI/Hepatic ,GERD  Medicated,,  Endo/Other    Renal/GU Renal InsufficiencyRenal disease     Musculoskeletal  (+) Arthritis , Osteoarthritis,    Abdominal   Peds  Hematology   Anesthesia Other Findings   Reproductive/Obstetrics                             Anesthesia Physical Anesthesia Plan  ASA: 2  Anesthesia Plan: MAC    Post-op Pain Management: Minimal or no pain anticipated   Induction:   PONV Risk Score and Plan: Treatment may vary due to age or medical condition  Airway Management Planned: Nasal Cannula and Natural Airway  Additional Equipment:   Intra-op Plan:   Post-operative Plan:   Informed Consent: I have reviewed the patients History and Physical, chart, labs and discussed the procedure including the risks, benefits and alternatives for the proposed anesthesia with the patient or authorized representative who has indicated his/her understanding and acceptance.     Dental advisory given  Plan Discussed with: CRNA and Surgeon  Anesthesia Plan Comments:         Anesthesia Quick Evaluation

## 2022-09-04 ENCOUNTER — Encounter (HOSPITAL_COMMUNITY): Payer: Self-pay | Admitting: Ophthalmology

## 2022-11-14 DIAGNOSIS — H903 Sensorineural hearing loss, bilateral: Secondary | ICD-10-CM | POA: Diagnosis not present

## 2022-11-14 DIAGNOSIS — R42 Dizziness and giddiness: Secondary | ICD-10-CM | POA: Diagnosis not present

## 2023-01-26 NOTE — Progress Notes (Unsigned)
    Cardiology Office Note  Date: 01/27/2023   ID: KHAYRI CALKIN, DOB 05/21/1946, MRN [id number]  History of Present Illness: Andrew Bates is a 77 y.o. male last seen in January.  He is here for a follow-up visit.  Reports recurring episodes of chest discomfort without obvious precipitant, also feelings of flushing like a hot flash with shortness of breath.  No palpitations or syncope.  He generally will take nitroglycerin and rest when this happens, although symptoms do not go away quickly with nitroglycerin.  He does admit that he has not been using CPAP for treatment of OSA in the last few months, but otherwise taking his medications regularly.  ECG today shows sinus rhythm with PVC, no acute ST segment changes in comparison to the prior tracing.  We went over his medications today and discussed increasing Imdur to 30 mg twice daily for now.  He is otherwise on aspirin, Norvasc, losartan, Crestor, and as needed nitroglycerin.  His last LDL was 68.  Physical Exam: VS:  BP (!) 130/90   Pulse 61   Ht 6\' 2"  (1.88 m)   Wt 206 lb (93.4 kg)   SpO2 94%   BMI 26.45 kg/m , BMI Body mass index is 26.45 kg/m.  Wt Readings from Last 3 Encounters:  01/27/23 206 lb (93.4 kg)  07/21/22 214 lb (97.1 kg)  04/23/22 210 lb 8 oz (95.5 kg)    General: Patient appears comfortable at rest. HEENT: Conjunctiva and lids normal. Neck: Supple, no elevated JVP or carotid bruits. Lungs: Clear to auscultation, nonlabored breathing at rest. Cardiac: Regular rate and rhythm, no S3 or significant systolic murmur. Extremities: No pitting edema.  ECG:  An ECG dated 07/21/2022 was personally reviewed today and demonstrated:  Sinus rhythm with PVC and fusion beat, borderline increased voltage.  Labwork:  September 2023: Hemoglobin 15.5, platelets 297, BUN 13, creatinine 1.29, potassium 4.6, AST 22, ALT 14, cholesterol 134, triglycerides 120, HDL 44, LDL 68, TSH 1.14  Other Studies Reviewed Today:  No  interval cardiac testing for review today.  Assessment and Plan:  1.  Nonobstructive CAD by cardiac catheterization in January 2023.  He does report intermittent chest discomfort with potential microvascular angina, although symptoms are not quickly responsive to nitroglycerin.  He has not been on CPAP for treatment of OSA in the last few months which could be a contributor, states that he has already considered going back to regular use which I would recommend.  Plan to continue aspirin, Norvasc, Cozaar, Crestor, and as needed nitroglycerin.  Increase Imdur to 30 mg twice daily for now.  We will plan to bring him back for clinical follow-up.  2.  Essential hypertension.  Continue with present medications as discussed above.  3.  Mixed hyperlipidemia.  LDL 68 in September 2023.  He remains on Crestor 20 mg daily.  Disposition:  Follow up  3 months.  Signed, Jonelle Sidle, M.D., F.A.C.C. [facility]

## 2023-01-27 ENCOUNTER — Ambulatory Visit: Payer: No Typology Code available for payment source | Attending: Cardiology | Admitting: Cardiology

## 2023-01-27 ENCOUNTER — Encounter: Payer: Self-pay | Admitting: Cardiology

## 2023-01-27 VITALS — BP 130/90 | HR 61 | Ht 74.0 in | Wt 206.0 lb

## 2023-01-27 DIAGNOSIS — I25119 Atherosclerotic heart disease of native coronary artery with unspecified angina pectoris: Secondary | ICD-10-CM

## 2023-01-27 DIAGNOSIS — I10 Essential (primary) hypertension: Secondary | ICD-10-CM

## 2023-01-27 DIAGNOSIS — R072 Precordial pain: Secondary | ICD-10-CM

## 2023-01-27 DIAGNOSIS — E782 Mixed hyperlipidemia: Secondary | ICD-10-CM | POA: Diagnosis not present

## 2023-01-27 MED ORDER — ISOSORBIDE MONONITRATE ER 30 MG PO TB24: 30.0000 mg | ORAL_TABLET | Freq: Two times a day (BID) | ORAL | 3 refills | Status: DC

## 2023-01-27 NOTE — Patient Instructions (Signed)
Medication Instructions:  INCREASE Imdur to 30 mg Twice a day  Labwork: None today  Testing/Procedures: None today  Follow-Up: 3 months  Any Other Special Instructions Will Be Listed Below (If Applicable).  If you need a refill on your cardiac medications before your next appointment, please call your pharmacy.

## 2023-02-26 DIAGNOSIS — H903 Sensorineural hearing loss, bilateral: Secondary | ICD-10-CM | POA: Diagnosis not present

## 2023-02-26 DIAGNOSIS — R519 Headache, unspecified: Secondary | ICD-10-CM | POA: Diagnosis not present

## 2023-02-26 DIAGNOSIS — R42 Dizziness and giddiness: Secondary | ICD-10-CM | POA: Diagnosis not present

## 2023-03-13 ENCOUNTER — Other Ambulatory Visit: Payer: Self-pay

## 2023-03-13 ENCOUNTER — Emergency Department (HOSPITAL_COMMUNITY)
Admission: EM | Admit: 2023-03-13 | Discharge: 2023-03-14 | Disposition: A | Discharge status: Home / Self Care | Payer: No Typology Code available for payment source | Attending: Emergency Medicine | Admitting: Emergency Medicine

## 2023-03-13 ENCOUNTER — Encounter (HOSPITAL_COMMUNITY): Payer: Self-pay

## 2023-03-13 DIAGNOSIS — Y92002 Bathroom of unspecified non-institutional (private) residence single-family (private) house as the place of occurrence of the external cause: Secondary | ICD-10-CM | POA: Diagnosis not present

## 2023-03-13 DIAGNOSIS — Z87891 Personal history of nicotine dependence: Secondary | ICD-10-CM | POA: Diagnosis not present

## 2023-03-13 DIAGNOSIS — J45909 Unspecified asthma, uncomplicated: Secondary | ICD-10-CM | POA: Diagnosis not present

## 2023-03-13 DIAGNOSIS — I2510 Atherosclerotic heart disease of native coronary artery without angina pectoris: Secondary | ICD-10-CM | POA: Diagnosis not present

## 2023-03-13 DIAGNOSIS — S0990XS Unspecified injury of head, sequela: Secondary | ICD-10-CM | POA: Diagnosis present

## 2023-03-13 DIAGNOSIS — R519 Headache, unspecified: Secondary | ICD-10-CM | POA: Diagnosis not present

## 2023-03-13 DIAGNOSIS — I10 Essential (primary) hypertension: Secondary | ICD-10-CM | POA: Insufficient documentation

## 2023-03-13 DIAGNOSIS — G44309 Post-traumatic headache, unspecified, not intractable: Secondary | ICD-10-CM | POA: Diagnosis not present

## 2023-03-13 DIAGNOSIS — W182XXS Fall in (into) shower or empty bathtub, sequela: Secondary | ICD-10-CM | POA: Diagnosis not present

## 2023-03-13 LAB — CBC WITH DIFFERENTIAL/PLATELET
Abs Immature Granulocytes: 0.01 10*3/uL (ref 0.00–0.07)
Basophils Absolute: 0.1 10*3/uL (ref 0.0–0.1)
Basophils Relative: 1 %
Eosinophils Absolute: 0.4 10*3/uL (ref 0.0–0.5)
Eosinophils Relative: 5 %
HCT: 45.6 % (ref 39.0–52.0)
Hemoglobin: 15.3 g/dL (ref 13.0–17.0)
Immature Granulocytes: 0 %
Lymphocytes Relative: 25 %
Lymphs Abs: 2.1 10*3/uL (ref 0.7–4.0)
MCH: 33.2 pg (ref 26.0–34.0)
MCHC: 33.6 g/dL (ref 30.0–36.0)
MCV: 98.9 fL (ref 80.0–100.0)
Monocytes Absolute: 0.8 10*3/uL (ref 0.1–1.0)
Monocytes Relative: 10 %
Neutro Abs: 4.8 10*3/uL (ref 1.7–7.7)
Neutrophils Relative %: 59 %
Platelets: 224 10*3/uL (ref 150–400)
RBC: 4.61 MIL/uL (ref 4.22–5.81)
RDW: 12.5 % (ref 11.5–15.5)
WBC: 8.1 10*3/uL (ref 4.0–10.5)
nRBC: 0 % (ref 0.0–0.2)

## 2023-03-13 NOTE — ED Triage Notes (Signed)
Pt fell back on June 19th and said that he has been having headaches and blurred vision off and on since then. He also is having pain in the neck off and on. Has been off balance since then as well. [facility] and they sent him here for a ct scan of head and neck for possible bleed.

## 2023-03-14 ENCOUNTER — Emergency Department (HOSPITAL_COMMUNITY): Payer: No Typology Code available for payment source

## 2023-03-14 NOTE — Discharge Instructions (Signed)
You were evaluated in the Emergency Department and after careful evaluation, we did not find any emergent condition requiring admission or further testing in the hospital.  Your exam/testing today is overall reassuring.  Recommend follow-up with your spine expert if your neck continues to bother you.  Recommend follow-up with the neurologist for your headaches.  Please return to the Emergency Department if you experience any worsening of your condition.   Thank you for allowing [place] to be a part of your care.

## 2023-03-14 NOTE — ED Provider Notes (Signed)
[facility] Emergency Department Provider Note MRN:  [id number]  Arrival date & time: 03/14/23     Chief Complaint   Head Injury   History of Present Illness   Andrew Bates is a 77 y.o. year-old male with a history of CAD, vertigo presenting to the ED with chief complaint of head trauma.  Fell in the shower and hit his head against the commode back in June.  Persistent headaches and neck pain since that time.  Had an x-ray of the neck at the [place], told to come here for CT imaging.  Denies numbness or weakness to the arms or legs, no bowel or bladder dysfunction.  Endorsing persistent worsening headaches and neck pain but no other symptoms.  Review of Systems  A thorough review of systems was obtained and all systems are negative except as noted in the HPI and PMH.   Patient's Health History    Past Medical History:  Diagnosis Date   Adenomatous colon polyp    Anxiety    Arthritis    Asthma    Coronary artery disease    Nonobstructive at cardiac catheterization 2020   Depression    DVT (deep venous thrombosis) (HCC) 2008   Essential hypertension    GERD (gastroesophageal reflux disease)    History of kidney stones    Hyperlipidemia    Internal hemorrhoids    Peripheral neuropathy    PONV (postoperative nausea and vomiting)    PTSD (post-traumatic stress disorder)    Sleep apnea    cpap   Vertigo     Past Surgical History:  Procedure Laterality Date   ANTERIOR CERVICAL DECOMP/DISCECTOMY FUSION N/A 07/09/2015   Procedure: ANTERIOR CERVICAL DECOMPRESSION/DISCECTOMY FUSION CERVICAL FIVE-SIX,CERVICAL SIX-SEVEN;  Surgeon: Tressie Stalker, MD;  [facility];  Service: Neurosurgery;  Laterality: N/A;   CARPAL TUNNEL RELEASE Bilateral 2014   CATARACT EXTRACTION W/PHACO Right 08/08/2022   Procedure: CATARACT EXTRACTION PHACO AND INTRAOCULAR LENS PLACEMENT (IOC);  Surgeon: Fabio Pierce, MD;  Location: [facility];  Service: Ophthalmology;   Laterality: Right;  CDE 9.66   CATARACT EXTRACTION W/PHACO Left 08/22/2022   Procedure: CATARACT EXTRACTION PHACO AND INTRAOCULAR LENS PLACEMENT (IOC);  Surgeon: Fabio Pierce, MD;  Location: [facility] ORS;  Service: Ophthalmology;  Laterality: Left;  CDE  7.65   COLONOSCOPY     CORONARY PRESSURE/FFR STUDY N/A 06/25/2021   Procedure: INTRAVASCULAR PRESSURE WIRE/FFR STUDY;  Surgeon: Marykay Lex, MD;  Location: [facility] INVASIVE CV LAB;  Service: Cardiovascular;  Laterality: N/A;   CYSTOSCOPY/RETROGRADE/URETEROSCOPY/STONE EXTRACTION WITH BASKET Left 02/14/2014   Procedure: CYSTOSCOPY AND LEFT DOUBLE J STENT PLACEMENT;  ATTEMPTED STONE  EXTRACTION ;  Surgeon: Chelsea Aus, MD;  Location: [facility];  Service: Urology;  Laterality: Left;   ENDOLYMPHATIC SAC DECOMPRESSION Left 06/14/2019   Procedure: ENDOLYMPHATIC SAC DECOMPRESSION;  Surgeon: Newman Pies, MD;  Location: [facility];  Service: ENT;  Laterality: Left;   ESOPHAGOGASTRODUODENOSCOPY ENDOSCOPY     HEMORRHOID BANDING     KNEE SURGERY Right 2006   Arthroscopy - MCl tear   LEFT HEART CATH AND CORONARY ANGIOGRAPHY N/A 08/17/2018   Procedure: LEFT HEART CATH AND CORONARY ANGIOGRAPHY;  Surgeon: Kathleene Hazel, MD;  [facility] IN[facility];  Service: Cardiovascular;  Laterality: N/A;   LEFT HEART CATH AND CORONARY ANGIOGRAPHY N/A 06/25/2021   Procedure: LEFT HEART CATH AND CORONARY ANGIOGRAPHY;  Surgeon: Marykay Lex, MD;  Location: [facility] INVASIVE CV LAB;  Service: Cardiovascular;  Laterality: N/A;  UPPER GASTROINTESTINAL ENDOSCOPY      Family History  Problem Relation Age of Onset   Hypertension Father    Hyperlipidemia Father    Lung cancer Father        from piece of abestos lodged in his lung   Thyroid cancer Mother    Colon cancer Brother 46   Colon polyps Brother    Bladder Cancer Brother    Esophageal cancer Neg Hx    Stomach cancer Neg Hx    Rectal cancer Neg Hx     Social History   Socioeconomic  History   Marital status: Married    Spouse name: [place] Lions   Number of children: 2   Years of education: Not on file   Highest education level: Not on file  Occupational History   Occupation: retired/diabled  Tobacco Use   Smoking status: Former    Types: Cigars    Quit date: 1994    Years since quitting: 30.7   Smokeless tobacco: Never   Tobacco comments:    quit 15 years ago (2017) never smoked alot  Vaping Use   Vaping status: Never Used  Substance and Sexual Activity   Alcohol use: No   Drug use: No   Sexual activity: Not on file  Other Topics Concern   Not on file  Social History Narrative   Not on file   Social Determinants of Health   Financial Resource Strain: Not on file  Food Insecurity: Not on file  Transportation Needs: Not on file  Physical Activity: Not on file  Stress: Not on file  Social Connections: Not on file  Intimate Partner Violence: Not on file     Physical Exam   Vitals:   03/14/23 0100 03/14/23 0200  BP: 129/84   Pulse: (!) 51 (!) 49  Resp: 17   Temp:    SpO2: 93% 93%    CONSTITUTIONAL: Well-appearing, NAD NEURO/PSYCH:  Alert and oriented x 3, no focal deficits EYES:  eyes equal and reactive ENT/NECK:  no LAD, no JVD CARDIO: Regular rate, well-perfused, normal S1 and S2 PULM:  CTAB no wheezing or rhonchi GI/GU:  non-distended, non-tender MSK/SPINE:  No gross deformities, no edema SKIN:  no rash, atraumatic   *Additional and/or pertinent findings included in MDM below  Diagnostic and Interventional Summary    EKG Interpretation Date/Time:    Ventricular Rate:    PR Interval:    QRS Duration:    QT Interval:    QTC Calculation:   R Axis:      Text Interpretation:         Labs Reviewed  CBC WITH DIFFERENTIAL/PLATELET    CT HEAD WO CONTRAST ( )  Final Result    CT CERVICAL SPINE WO CONTRAST  Final Result      Medications - No data to display   Procedures  /  Critical Care Procedures  ED Course and Medical  Decision Making  Initial Impression and Ddx Differential diagnosis includes subdural hematoma, cervical spinal fracture awaiting CT imaging.  Past medical/surgical history that increases complexity of ED encounter: Vertigo  Interpretation of Diagnostics CT imaging is without significant injury.  Patient Reassessment and Ultimate Disposition/Management     Patient still doing well on reassessment, no signs or symptoms of myelopathy.  Suspect postconcussive syndrome or headaches triggered by the recent trauma.  Referred to neurology, appropriate for discharge.  Patient management required discussion with the following services or consulting groups:  None  Complexity of Problems Addressed Acute  illness or injury that poses threat of life of bodily function  Additional Data Reviewed and Analyzed Further history obtained from: Further history from spouse/family member  Additional Factors Impacting ED Encounter Risk None  Elmer Sow. Pilar Plate, MD [facility] Emergency [facility] [email or web address]  Final Clinical Impressions(s) / ED Diagnoses     ICD-10-CM   1. Headaches due to old head trauma  G44.309 Ambulatory referral to Neurology   S09.90XS       ED Discharge Orders          Ordered    Ambulatory referral to Neurology       Comments: An appointment is requested in approximately: 1 week   03/14/23 0302             Discharge Instructions Discussed with and Provided to Patient:     Discharge Instructions      You were evaluated in the Emergency Department and after careful evaluation, we did not find any emergent condition requiring admission or further testing in the hospital.  Your exam/testing today is overall reassuring.  Recommend follow-up with your spine expert if your neck continues to bother you.  Recommend follow-up with the neurologist for your headaches.  Please return to the Emergency Department if you experience any  worsening of your condition.   Thank you for allowing [place] to be a part of your care.       Sabas Sous, MD 03/14/23 [phone]

## 2023-03-19 DIAGNOSIS — E663 Overweight: Secondary | ICD-10-CM | POA: Diagnosis not present

## 2023-03-19 DIAGNOSIS — E782 Mixed hyperlipidemia: Secondary | ICD-10-CM | POA: Diagnosis not present

## 2023-03-19 DIAGNOSIS — I10 Essential (primary) hypertension: Secondary | ICD-10-CM | POA: Diagnosis not present

## 2023-03-19 DIAGNOSIS — I2510 Atherosclerotic heart disease of native coronary artery without angina pectoris: Secondary | ICD-10-CM | POA: Diagnosis not present

## 2023-03-19 DIAGNOSIS — Z0001 Encounter for general adult medical examination with abnormal findings: Secondary | ICD-10-CM | POA: Diagnosis not present

## 2023-03-19 DIAGNOSIS — Z1331 Encounter for screening for depression: Secondary | ICD-10-CM | POA: Diagnosis not present

## 2023-03-19 DIAGNOSIS — Z6827 Body mass index (BMI) 27.0-27.9, adult: Secondary | ICD-10-CM | POA: Diagnosis not present

## 2023-03-19 DIAGNOSIS — E1100 Type 2 diabetes mellitus with hyperosmolarity without nonketotic hyperglycemic-hyperosmolar coma (NKHHC): Secondary | ICD-10-CM | POA: Diagnosis not present

## 2023-03-19 DIAGNOSIS — F4312 Post-traumatic stress disorder, chronic: Secondary | ICD-10-CM | POA: Diagnosis not present

## 2023-04-28 ENCOUNTER — Ambulatory Visit: Payer: PPO | Admitting: Urology

## 2023-04-28 DIAGNOSIS — N5201 Erectile dysfunction due to arterial insufficiency: Secondary | ICD-10-CM

## 2023-04-28 DIAGNOSIS — N200 Calculus of kidney: Secondary | ICD-10-CM

## 2023-05-13 ENCOUNTER — Encounter: Payer: Self-pay | Admitting: Cardiology

## 2023-05-13 ENCOUNTER — Ambulatory Visit: Payer: PPO | Attending: Cardiology | Admitting: Cardiology

## 2023-05-13 VITALS — BP 144/82 | HR 65 | Ht 74.0 in | Wt 204.4 lb

## 2023-05-13 DIAGNOSIS — I25119 Atherosclerotic heart disease of native coronary artery with unspecified angina pectoris: Secondary | ICD-10-CM | POA: Diagnosis not present

## 2023-05-13 DIAGNOSIS — E782 Mixed hyperlipidemia: Secondary | ICD-10-CM | POA: Diagnosis not present

## 2023-05-13 DIAGNOSIS — I10 Essential (primary) hypertension: Secondary | ICD-10-CM | POA: Diagnosis not present

## 2023-05-13 NOTE — Progress Notes (Signed)
    Cardiology Office Note  Date: 05/13/2023   ID: Harlow, Mcdermott 02/06/1946, MRN [id number]  History of Present Illness: Andrew Bates is a 77 y.o. male last seen in August.  He is here today with his wife for a follow-up visit.  He does report intermittent episodes of angina, although indicates fewer of these since we uptitrated his Imdur.  He has also been having trouble with vertigo and atypical migraines.  Activity is limited by recurring low back pain.  We did discuss diet and exercise today.  He states that he has been taking his medications regularly.  I reviewed his medications.  Current cardiac regimen includes aspirin, Norvasc, Imdur, Cozaar, Crestor, and as needed nitroglycerin.  Lab work in September showed LDL 68.  Physical Exam: VS:  BP (!) 144/82   Pulse 65   Ht 6\' 2"  (1.88 m)   Wt 204 lb 6.4 oz (92.7 kg)   SpO2 94%   BMI 26.24 kg/m , BMI Body mass index is 26.24 kg/m.  Wt Readings from Last 3 Encounters:  05/13/23 204 lb 6.4 oz (92.7 kg)  03/13/23 206 lb (93.4 kg)  01/27/23 206 lb (93.4 kg)    General: Patient appears comfortable at rest. HEENT: Conjunctiva and lids normal. Neck: Supple, no elevated JVP or carotid bruits. Lungs: Clear to auscultation, nonlabored breathing at rest. Cardiac: Regular rate and rhythm, no S3 or significant systolic murmur. Abdomen: Soft, bowel sounds present.  ECG:  An ECG dated 01/27/2023 was personally reviewed today and demonstrated:  Sinus rhythm with PVC.  Labwork: 03/13/2023: Hemoglobin 15.3; Platelets 224  September 2024: Hemoglobin 15.5, platelets 297, BUN 13, creatinine 1.29, potassium 4.6, AST 22, ALT 14, cholesterol 134, triglycerides 120, HDL 44, LDL 68, TSH 1.14  Other Studies Reviewed Today:  No interval cardiac testing for review today.  Assessment and Plan:  1.  Nonobstructive CAD by cardiac catheterization in January 2023.  Likely has microvascular angina as well.  He reports fewer symptoms since up  titration of Imdur.  Continue aspirin, Norvasc, Cozaar, Imdur, Crestor, and as needed nitroglycerin.  He does have room for up titration of therapy, we discussed this today depending on symptom frequency.   2.  Primary hypertension.  Blood pressure elevated today.  Continue to track measurements at home.  He has room for up titration of Imdur and Norvasc.   3.  Mixed hyperlipidemia.  LDL 68 in September of this year.  Continue Crestor 20 mg daily.  Disposition:  Follow up  6 months, sooner if needed.  Signed, Jonelle Sidle, M.D., F.A.C.C. [facility]

## 2023-05-13 NOTE — Patient Instructions (Signed)
Medication Instructions:  Your physician recommends that you continue on your current medications as directed. Please refer to the Current Medication list given to you today.   Labwork: None today  Testing/Procedures: None today  Follow-Up: 6 months  Any Other Special Instructions Will Be Listed Below (If Applicable).  If you need a refill on your cardiac medications before your next appointment, please call your pharmacy.  

## 2023-05-29 ENCOUNTER — Encounter (INDEPENDENT_AMBULATORY_CARE_PROVIDER_SITE_OTHER): Payer: Self-pay

## 2023-05-29 ENCOUNTER — Ambulatory Visit (INDEPENDENT_AMBULATORY_CARE_PROVIDER_SITE_OTHER): Payer: PPO | Admitting: Otolaryngology

## 2023-05-29 VITALS — Ht 74.0 in | Wt 204.0 lb

## 2023-05-29 DIAGNOSIS — H8111 Benign paroxysmal vertigo, right ear: Secondary | ICD-10-CM | POA: Diagnosis not present

## 2023-05-29 DIAGNOSIS — H903 Sensorineural hearing loss, bilateral: Secondary | ICD-10-CM

## 2023-05-29 DIAGNOSIS — R519 Headache, unspecified: Secondary | ICD-10-CM | POA: Diagnosis not present

## 2023-05-29 DIAGNOSIS — R42 Dizziness and giddiness: Secondary | ICD-10-CM

## 2023-05-30 DIAGNOSIS — R519 Headache, unspecified: Secondary | ICD-10-CM | POA: Insufficient documentation

## 2023-05-30 DIAGNOSIS — H903 Sensorineural hearing loss, bilateral: Secondary | ICD-10-CM | POA: Insufficient documentation

## 2023-05-30 DIAGNOSIS — H8111 Benign paroxysmal vertigo, right ear: Secondary | ICD-10-CM | POA: Insufficient documentation

## 2023-05-30 NOTE — Progress Notes (Signed)
Patient ID: Andrew Bates, male   DOB: [birth year], 77 y.o.   MRN: [id number]  Follow-up: Recurrent dizziness, left ear Mnire's disease, recent headaches  HPI: The patient is a 77 year old male who returns today for his follow-up evaluation.  The patient was last seen in September 2024.  At that time, he was complaining of recurrent dizziness, severe headaches, and severe nausea. The patient has a history of recurrent dizziness and severe left ear Meniere's disease.  He underwent left endolymphatic sac decompression surgery in 2020.  At his last visit, he was complaining of more recurrent dizziness, severe headaches, and nausea.  He was diagnosed with possible vestibular migraine.  He was continued on his low-salt diet.  He was also treated with Valium and Zofran.  He was referred to her neurologist.  The patient returns today complaining of more recurrent dizziness.  He describes his dizziness as an intermittent spinning vertigo, with constant imbalance.  He continues to have headaches during his severe vertigo.  He did not see the neurologist.  According to the patient, he had a bad experience at [facility] neurologic in the past.  He did not want to go back.  He continues to have bilateral hearing difficulty.  He denies any recent change in his hearing.  Exam: General: Communicates without difficulty, well nourished, no acute distress. Head: Normocephalic, no evidence injury, no tenderness, facial buttresses intact without stepoff. Face/sinus: No tenderness to palpation and percussion. Facial movement is normal and symmetric. Eyes: PERRL, EOMI. No scleral icterus, conjunctivae clear. Neuro: CN II exam reveals vision grossly intact.  No nystagmus at any point of gaze. Ears: Auricles well formed without lesions.  Ear canals are intact without mass or lesion.  No erythema or edema is appreciated.  The TMs are intact without fluid. Nose: External evaluation reveals normal support and skin without lesions.   Dorsum is intact.  Anterior rhinoscopy reveals congested mucosa over anterior aspect of inferior turbinates and intact septum.  No purulence noted. Oral:  Oral cavity and oropharynx are intact, symmetric, without erythema or edema.  Mucosa is moist without lesions. Neck: Full range of motion without pain.  There is no significant lymphadenopathy.  No masses palpable.  Thyroid bed within normal limits to palpation.  Parotid glands and submandibular glands equal bilaterally without mass.  Trachea is midline. Neuro:  CN 2-12 grossly intact.  Gait: Wide-based. Vestibular: Dix-Hallpike produces rotational nystagmus with right-sided positioning.  Vestibular: There is no nystagmus with pneumatic pressure on either tympanic membrane or Valsalva. The cerebellar examination is unremarkable.    Procedure: Right-sided Epley maneuver  Anesthesia: None Indication: To treat the right BPPV Desciption:  The patient is first placed in a right-sided Dix-Hallpike position. After the vertigo has subsided, the head is gradually rotated from the right to the left, completing a 180 turn. The patient is then returned to the upright position. The patient tolerated the procedure well without any difficulty.    Assessment: 1.  Recurrent dizziness and headaches, possibly a result of vestibular migraine.  The patient also has a history of left ear Mnire's disease.  He was previously treated with left endolymphatic sac decompression. 2.  His right Dix-Hallpike maneuver is positive today, consistent with right benign paroxysmal positional vertigo. 3.  His ear canals, tympanic membranes, and middle ear spaces are normal. 4.  Subjectively stable bilateral high-frequency sensorineural hearing loss.  Plan: 1.  The physical exam findings are reviewed with the patient. 2.  The right Epley maneuver is performed today  without difficulty. 3.  Post Epley activity restrictions are discussed. 4.  Continue with Valium as needed. 5.   Continue with his 1500 mg low-salt diet. 6.  The possibility of vestibular migraine causing his headaches and dizziness is also discussed.  Other possible differential diagnoses are also discussed.  The patient is not interested in seeing a neurologist at this time. 7.  The patient will return for reevaluation in 4 weeks.

## 2023-06-01 NOTE — Progress Notes (Signed)
06/02/2023 12:35 PM   Andrew Bates November 26, 1945 [id number]  Referring provider: Assunta Found, MD [address] [place],  [place] [address]  No chief complaint on file.   HPI:    PMH: Past Medical History:  Diagnosis Date   Adenomatous colon polyp    Anxiety    Arthritis    Asthma    Coronary artery disease    Nonobstructive at cardiac catheterization 2020   Depression    DVT (deep venous thrombosis) (HCC) 2008   Essential hypertension    GERD (gastroesophageal reflux disease)    History of kidney stones    Hyperlipidemia    Internal hemorrhoids    Peripheral neuropathy    PONV (postoperative nausea and vomiting)    PTSD (post-traumatic stress disorder)    Sleep apnea    cpap   Vertigo     Surgical History: Past Surgical History:  Procedure Laterality Date   ANTERIOR CERVICAL DECOMP/DISCECTOMY FUSION N/A 07/09/2015   Procedure: ANTERIOR CERVICAL DECOMPRESSION/DISCECTOMY FUSION CERVICAL FIVE-SIX,CERVICAL SIX-SEVEN;  Surgeon: Tressie Stalker, MD;  Location: [facility];  Service: Neurosurgery;  Laterality: N/A;   CARPAL TUNNEL RELEASE Bilateral 2014   CATARACT EXTRACTION W/PHACO Right 08/08/2022   Procedure: CATARACT EXTRACTION PHACO AND INTRAOCULAR LENS PLACEMENT (IOC);  Surgeon: Fabio Pierce, MD;  Location: [facility];  Service: Ophthalmology;  Laterality: Right;  CDE 9.66   CATARACT EXTRACTION W/PHACO Left 08/22/2022   Procedure: CATARACT EXTRACTION PHACO AND INTRAOCULAR LENS PLACEMENT (IOC);  Surgeon: Fabio Pierce, MD;  Location: [facility];  Service: Ophthalmology;  Laterality: Left;  CDE  7.65   COLONOSCOPY     CORONARY PRESSURE/FFR STUDY N/A 06/25/2021   Procedure: INTRAVASCULAR PRESSURE WIRE/FFR STUDY;  Surgeon: Marykay Lex, MD;  Location: [facility] INVASIVE CV LAB;  Service: Cardiovascular;  Laterality: N/A;   CYSTOSCOPY/RETROGRADE/URETEROSCOPY/STONE EXTRACTION WITH BASKET Left 02/14/2014   Procedure: CYSTOSCOPY AND LEFT DOUBLE J STENT PLACEMENT;  ATTEMPTED  STONE  EXTRACTION ;  Surgeon: Chelsea Aus, MD;  Location: [facility];  Service: Urology;  Laterality: Left;   ENDOLYMPHATIC SAC DECOMPRESSION Left 06/14/2019   Procedure: ENDOLYMPHATIC SAC DECOMPRESSION;  Surgeon: Newman Pies, MD;  Location: [facility];  Service: ENT;  Laterality: Left;   ESOPHAGOGASTRODUODENOSCOPY ENDOSCOPY     HEMORRHOID BANDING     KNEE SURGERY Right 2006   Arthroscopy - MCl tear   LEFT HEART CATH AND CORONARY ANGIOGRAPHY N/A 08/17/2018   Procedure: LEFT HEART CATH AND CORONARY ANGIOGRAPHY;  Surgeon: Kathleene Hazel, MD;  Location: [facility];  Service: Cardiovascular;  Laterality: N/A;   LEFT HEART CATH AND CORONARY ANGIOGRAPHY N/A 06/25/2021   Procedure: LEFT HEART CATH AND CORONARY ANGIOGRAPHY;  Surgeon: Marykay Lex, MD;  Location: [facility] INVASIVE CV LAB;  Service: Cardiovascular;  Laterality: N/A;   UPPER GASTROINTESTINAL ENDOSCOPY      Home Medications:  Allergies as of 06/02/2023       Reactions   Other Nausea And Vomiting, Other (See Comments)   "STRONG" PAIN MEDICATIONS IN GENERAL: HEADACHE   Tamsulosin Anaphylaxis   Codeine Nausea And Vomiting   REACTION: Headache   Morphine And Codeine Nausea And Vomiting   Metoprolol Itching   Oxybutynin Swelling   Pentazocine-naloxone Hcl Nausea And Vomiting   Sertraline Swelling   Swelling in knee 100 mg   Topiramate Other (See Comments)   Headache        Medication List        Accurate as of June 01, 2023 12:35 PM.  If you have any questions, ask your nurse or doctor.          acetaminophen 500 MG tablet Commonly known as: TYLENOL Take 500 mg by mouth every 6 (six) hours as needed for fever.   amLODipine 5 MG tablet Commonly known as: NORVASC Take 1 tablet (5 mg total) by mouth daily.   aspirin EC 81 MG tablet Take 1 tablet (81 mg total) by mouth daily.   BENEFIBER DRINK MIX PO Take by mouth as needed (constipation).   Biotin 5 MG Caps Take 10 mg by mouth  daily.   buPROPion 150 MG 24 hr tablet Commonly known as: WELLBUTRIN XL Take 150 mg by mouth daily.   busPIRone 10 MG tablet Commonly known as: BUSPAR Take 10 mg by mouth 2 (two) times daily. anxiety   diazepam 10 MG tablet Commonly known as: VALIUM Take 10 mg by mouth 2 (two) times daily as needed for anxiety.   DRY EYES OP Apply 1 application to eye as needed (dry eyes).   hydrocortisone 2.5 % rectal cream Commonly known as: ANUSOL-HC Place 1 application  rectally at bedtime.   isosorbide mononitrate 30 MG 24 hr tablet Commonly known as: IMDUR Take 1 tablet (30 mg total) by mouth in the morning and at bedtime.   losartan 50 MG tablet Commonly known as: COZAAR TAKE 1 TABLET BY MOUTH TWICE A DAILY.   mirtazapine 45 MG tablet Commonly known as: REMERON Take 45 mg by mouth at bedtime.   nitroGLYCERIN 0.4 MG SL tablet Commonly known as: NITROSTAT Place 1 tablet (0.4 mg total) under the tongue every 5 (five) minutes x 3 doses as needed for chest pain.   omeprazole 40 MG capsule Commonly known as: PRILOSEC TAKE ONE CAPSULE BY MOUTH ONCE A DAY FOR HEARTBURN (TAKE ON AN EMPTY STOMACH 30 MINUTES PRIOR TO A MEAL)   ondansetron 4 MG disintegrating tablet Commonly known as: Zofran ODT Take 1 tablet (4 mg total) by mouth every 8 (eight) hours as needed for nausea or vomiting.   polyethylene glycol 17 g packet Commonly known as: MIRALAX / GLYCOLAX Take 17 g by mouth as needed for moderate constipation.   rosuvastatin 20 MG tablet Commonly known as: CRESTOR TAKE 1 TABLET BY MOUTH AT 6PM.        Allergies:  Allergies  Allergen Reactions   Other Nausea And Vomiting and Other (See Comments)    "STRONG" PAIN MEDICATIONS IN GENERAL: HEADACHE   Tamsulosin Anaphylaxis   Codeine Nausea And Vomiting    REACTION: Headache   Morphine And Codeine Nausea And Vomiting   Metoprolol Itching   Oxybutynin Swelling   Pentazocine-Naloxone Hcl Nausea And Vomiting   Sertraline  Swelling    Swelling in knee 100 mg   Topiramate Other (See Comments)    Headache    Family History: Family History  Problem Relation Age of Onset   Hypertension Father    Hyperlipidemia Father    Lung cancer Father        from piece of abestos lodged in his lung   Thyroid cancer Mother    Colon cancer Brother 5   Colon polyps Brother    Bladder Cancer Brother    Esophageal cancer Neg Hx    Stomach cancer Neg Hx    Rectal cancer Neg Hx     Social History:  reports that he quit smoking about 30 years ago. His smoking use included cigars. He has never used smokeless tobacco. He reports that he  does not drink alcohol and does not use drugs.  ROS: All other review of systems were reviewed and are negative except what is noted above in HPI  Physical Exam: There were no vitals taken for this visit.  Constitutional:  Alert and oriented, No acute distress. HEENT: [place] AT, moist mucus membranes.  Trachea midline, no masses. Cardiovascular: No clubbing, cyanosis, or edema. Respiratory: Normal respiratory effort, no increased work of breathing. GI: Abdomen is soft, nontender, nondistended, no abdominal masses GU: No CVA tenderness. Circumcised phallus. No masses/lesions on penis, testis, scrotum. Prostate 40g smooth no nodules no induration.  Lymph: No cervical or inguinal lymphadenopathy. Skin: No rashes, bruises or suspicious lesions. Neurologic: Grossly intact, no focal deficits, moving all 4 extremities. Psychiatric: Normal mood and affect.  Laboratory Data: Lab Results  Component Value Date   WBC 8.1 03/13/2023   HGB 15.3 03/13/2023   HCT 45.6 03/13/2023   MCV 98.9 03/13/2023   PLT 224 03/13/2023    Lab Results  Component Value Date   CREATININE 1.34 (H) 06/26/2021    No results found for: "PSA"  No results found for: "TESTOSTERONE"  Lab Results  Component Value Date   HGBA1C 5.7 (H) 06/22/2021    Urinalysis    Component Value Date/Time   COLORURINE YELLOW  04/27/2019 2120   APPEARANCEUR CLEAR 04/27/2019 2120   LABSPEC 1.024 04/27/2019 2120   PHURINE 5.0 04/27/2019 2120   GLUCOSEU NEGATIVE 04/27/2019 2120   GLUCOSEU NEGATIVE 05/02/2014 1529   HGBUR NEGATIVE 04/27/2019 2120   BILIRUBINUR NEGATIVE 04/27/2019 2120   KETONESUR 5 (A) 04/27/2019 2120   PROTEINUR 30 (A) 04/27/2019 2120   UROBILINOGEN 0.2 02/25/2015 1659   NITRITE NEGATIVE 04/27/2019 2120   LEUKOCYTESUR NEGATIVE 04/27/2019 2120    Lab Results  Component Value Date   MUCUS Presence of (A) 05/02/2014   BACTERIA NONE SEEN 04/27/2019    Pertinent Imaging: *** Results for orders placed during the hospital encounter of 04/02/15  DG Abd 1 View  Narrative CLINICAL DATA:  Left Kidney stone.  Left side flank pain.  EXAM: ABDOMEN - 1 VIEW  COMPARISON:  05/05/2014 CT  FINDINGS: The bowel gas pattern is normal. No radio-opaque calculi or other significant radiographic abnormality are seen.  IMPRESSION: No visible renal or ureteral stones.  No acute findings.   Electronically Signed By: Charlett Nose M.D. On: 04/02/2015 10:31  No results found for this or any previous visit.  No results found for this or any previous visit.  No results found for this or any previous visit.  Results for orders placed during the hospital encounter of 05/22/16  US Renal  Narrative CLINICAL DATA:  Dysuria.  Kidney stone.  EXAM: RENAL / URINARY TRACT ULTRASOUND COMPLETE  COMPARISON:  04/02/2015.  FINDINGS: Right Kidney:  Length: 10.9 cm. Echogenicity within normal limits. No mass or hydronephrosis visualized. Nonobstructing calyceal stone.  Left Kidney:  Length: 10.9 cm. Echogenicity within normal limits. No mass or hydronephrosis visualized. Nonobstructing calyceal stone.  Bladder:  Appears normal for degree of bladder distention. Ureteral jets not identified.  IMPRESSION: 1. Nonobstructing bilateral renal calyceal stones.  2. No other focal abnormality  identified. No hydronephrosis. Ureteral jets not identified.   Electronically Signed By: Maisie Fus  Register On: 05/23/2016 06:35  No valid procedures specified. No results found for this or any previous visit.  No results found for this or any previous visit.   Assessment & Plan:    There are no diagnoses linked to this encounter.  No  follow-ups on file.  Chelsea Aus, MD  [facility] [place]

## 2023-06-02 ENCOUNTER — Ambulatory Visit: Payer: PPO | Admitting: Urology

## 2023-06-02 VITALS — BP 139/88 | HR 63

## 2023-06-02 DIAGNOSIS — R399 Unspecified symptoms and signs involving the genitourinary system: Secondary | ICD-10-CM | POA: Diagnosis not present

## 2023-06-02 DIAGNOSIS — Z87442 Personal history of urinary calculi: Secondary | ICD-10-CM | POA: Diagnosis not present

## 2023-06-02 DIAGNOSIS — N200 Calculus of kidney: Secondary | ICD-10-CM

## 2023-06-02 DIAGNOSIS — H26493 Other secondary cataract, bilateral: Secondary | ICD-10-CM | POA: Diagnosis not present

## 2023-06-02 LAB — URINALYSIS, ROUTINE W REFLEX MICROSCOPIC
Bilirubin, UA: NEGATIVE
Glucose, UA: NEGATIVE
Ketones, UA: NEGATIVE
Leukocytes,UA: NEGATIVE
Nitrite, UA: NEGATIVE
Protein,UA: NEGATIVE
RBC, UA: NEGATIVE
Specific Gravity, UA: 1.015 (ref 1.005–1.030)
Urobilinogen, Ur: 2 mg/dL — ABNORMAL HIGH (ref 0.2–1.0)
pH, UA: 7 (ref 5.0–7.5)

## 2023-06-04 ENCOUNTER — Ambulatory Visit (HOSPITAL_COMMUNITY)
Admission: RE | Admit: 2023-06-04 | Discharge: 2023-06-04 | Disposition: A | Discharge status: Home / Self Care | Payer: PPO | Source: Ambulatory Visit | Attending: Urology | Admitting: Urology

## 2023-06-04 DIAGNOSIS — N200 Calculus of kidney: Secondary | ICD-10-CM | POA: Insufficient documentation

## 2023-06-04 DIAGNOSIS — I878 Other specified disorders of veins: Secondary | ICD-10-CM | POA: Diagnosis not present

## 2023-06-09 ENCOUNTER — Other Ambulatory Visit: Payer: Self-pay | Admitting: Cardiology

## 2023-06-12 ENCOUNTER — Telehealth (INDEPENDENT_AMBULATORY_CARE_PROVIDER_SITE_OTHER): Payer: Self-pay

## 2023-06-12 NOTE — Telephone Encounter (Signed)
Patient states he has worsening symptoms of vertigo and pain going down from his ear to his neck. I told him we could probably get him in sooner for an appointment. Patient states he doesn't want to be seen any sooner that he just wanted to notify Dr. Suszanne Conners of more worse symptoms.

## 2023-06-18 ENCOUNTER — Telehealth: Payer: Self-pay

## 2023-06-18 NOTE — Telephone Encounter (Signed)
Patient is made aware and voiced understanding. 

## 2023-06-18 NOTE — Telephone Encounter (Signed)
-----   Message from Bertram Millard Dahlstedt sent at 06/18/2023  8:14 AM EST ----- Call pt xr looks fine--no kidney stones ----- Message ----- From: Interface, Rad Results In Sent: 06/16/2023   8:49 AM EST To: Marcine Matar, MD

## 2023-06-23 ENCOUNTER — Encounter (INDEPENDENT_AMBULATORY_CARE_PROVIDER_SITE_OTHER): Payer: Self-pay

## 2023-06-23 ENCOUNTER — Ambulatory Visit (INDEPENDENT_AMBULATORY_CARE_PROVIDER_SITE_OTHER): Payer: PPO | Admitting: Otolaryngology

## 2023-06-23 VITALS — Ht 74.0 in | Wt 204.0 lb

## 2023-06-23 DIAGNOSIS — H8102 Meniere's disease, left ear: Secondary | ICD-10-CM | POA: Diagnosis not present

## 2023-06-23 DIAGNOSIS — R519 Headache, unspecified: Secondary | ICD-10-CM

## 2023-06-23 DIAGNOSIS — H903 Sensorineural hearing loss, bilateral: Secondary | ICD-10-CM | POA: Diagnosis not present

## 2023-06-23 DIAGNOSIS — R42 Dizziness and giddiness: Secondary | ICD-10-CM

## 2023-06-23 DIAGNOSIS — H8112 Benign paroxysmal vertigo, left ear: Secondary | ICD-10-CM | POA: Diagnosis not present

## 2023-06-23 NOTE — Progress Notes (Signed)
 Patient ID: Andrew Bates, male   DOB: 1946-01-18, 77 y.o.   MRN: [id number]  Follow-up: Recurrent dizziness, left ear Mnire's disease, recurrent headaches, severe nausea, hearing loss  HPI: The patient is a 77 year old male who returns today for his follow-up evaluation.  The patient has a history of multiple complex medical issues, including frequent severe dizziness, severe nausea, and recurrent headaches.  The patient has a history of lateral hearing loss and severe left ear Meniere's disease.  He underwent left endolymphatic sac decompression surgery in 2020 with good control of his dizziness for several years.  Over the past year, he has been having more recurrent dizziness, severe headaches, and nausea.  He was diagnosed with possible vestibular migraine.  He was continued on his low-salt diet.  He was also treated with Valium  and Zofran .  He was referred to a neurologist.  However, the patient did not see the neurologist due to his previous bad experience at [facility].  At his last visit earlier this month, his right Dix-Hallpike maneuver was positive.  He was treated with right Epley maneuver.  The patient returns today complaining of more recurrent dizziness.  The severity of the dizziness has slightly decreased.  He describes his dizziness as an intermittent spinning vertigo, with constant imbalance.  He continues to have intermittent headaches, but the severity has significantly decreased.   He continues to have bilateral hearing difficulty.  He denies any recent change in his hearing.   Exam: General: Communicates without difficulty, well nourished, no acute distress. Head: Normocephalic, no evidence injury, no tenderness, facial buttresses intact without stepoff. Face/sinus: No tenderness to palpation and percussion. Facial movement is normal and symmetric. Eyes: PERRL, EOMI. No scleral icterus, conjunctivae clear. Neuro: CN II exam reveals vision grossly intact.  No  nystagmus at any point of gaze. Ears: Auricles well formed without lesions.  Ear canals are intact without mass or lesion.  No erythema or edema is appreciated.  The TMs are intact without fluid. Nose: External evaluation reveals normal support and skin without lesions.  Dorsum is intact.  Anterior rhinoscopy reveals congested mucosa over anterior aspect of inferior turbinates and intact septum.  No purulence noted. Oral:  Oral cavity and oropharynx are intact, symmetric, without erythema or edema.  Mucosa is moist without lesions. Neck: Full range of motion without pain.  There is no significant lymphadenopathy.  No masses palpable.  Thyroid  bed within normal limits to palpation.  Parotid glands and submandibular glands equal bilaterally without mass.  Trachea is midline. Neuro:  CN 2-12 grossly intact.  Gait: Wide-based. Vestibular: Dix-Hallpike produces rotational nystagmus with left-sided positioning.  No nystagmus was noted with the right-sided positioning.  Vestibular: There is no nystagmus with pneumatic pressure on either tympanic membrane or Valsalva. The cerebellar examination is unremarkable.     Procedure: Left-sided Epley maneuver  Anesthesia: None Indication: To treat the left BPPV Desciption:  The patient is first placed in a left-sided Dix-Hallpike position. After the vertigo has subsided, the head is gradually rotated from the left to the right, completing a 180 turn. The patient is then returned to the upright position. The patient tolerated the procedure well without any difficulty.     Assessment: 1.  Chronic left ear Mnire's disease.  He was previously treated with left endolymphatic sac decompression, with good control of his dizziness for several years.  However, he has been increasingly symptomatic for over the past year. 2.  His left Dix-Hallpike maneuver is positive today, consistent  with left benign paroxysmal positional vertigo.  His right BPPV has resolved following his right  Epley treatment earlier this month. 3.  His ear canals, tympanic membranes, and middle ear spaces are normal. 4.  Subjectively stable bilateral high-frequency sensorineural hearing loss.   Plan: 1.  The physical exam findings are reviewed with the patient. 2.  The left Epley maneuver is performed today without difficulty. 3.  Post Epley activity restrictions are discussed. 4.  Continue with Valium  as needed. 5.  Continue with his 1500 mg low-salt diet. 6.  The possibility of vestibular migraine causing his headaches and dizziness is again discussed.  Other possible differential diagnoses are also discussed.  The patient is not interested in seeing a neurologist at this time. 7.  The patient will return for reevaluation in 2 months.

## 2023-06-24 DIAGNOSIS — H8102 Meniere's disease, left ear: Secondary | ICD-10-CM | POA: Insufficient documentation

## 2023-06-24 DIAGNOSIS — H8111 Benign paroxysmal vertigo, right ear: Secondary | ICD-10-CM | POA: Insufficient documentation

## 2023-06-24 DIAGNOSIS — H8112 Benign paroxysmal vertigo, left ear: Secondary | ICD-10-CM | POA: Insufficient documentation

## 2023-06-29 DIAGNOSIS — H26491 Other secondary cataract, right eye: Secondary | ICD-10-CM | POA: Diagnosis not present

## 2023-07-16 DIAGNOSIS — H26492 Other secondary cataract, left eye: Secondary | ICD-10-CM | POA: Diagnosis not present

## 2023-08-20 ENCOUNTER — Telehealth (INDEPENDENT_AMBULATORY_CARE_PROVIDER_SITE_OTHER): Payer: Self-pay | Admitting: Otolaryngology

## 2023-08-20 NOTE — Telephone Encounter (Signed)
 Confirmed appt & location [id number] afm

## 2023-08-21 ENCOUNTER — Encounter (INDEPENDENT_AMBULATORY_CARE_PROVIDER_SITE_OTHER): Payer: Self-pay

## 2023-08-21 ENCOUNTER — Ambulatory Visit (INDEPENDENT_AMBULATORY_CARE_PROVIDER_SITE_OTHER): Payer: Medicare Other | Admitting: Otolaryngology

## 2023-08-21 VITALS — BP 139/80 | HR 60 | Ht 74.0 in | Wt 208.0 lb

## 2023-08-21 DIAGNOSIS — T162XXA Foreign body in left ear, initial encounter: Secondary | ICD-10-CM | POA: Diagnosis not present

## 2023-08-21 DIAGNOSIS — H8102 Meniere's disease, left ear: Secondary | ICD-10-CM | POA: Diagnosis not present

## 2023-08-21 DIAGNOSIS — H903 Sensorineural hearing loss, bilateral: Secondary | ICD-10-CM

## 2023-08-21 DIAGNOSIS — R519 Headache, unspecified: Secondary | ICD-10-CM

## 2023-08-21 DIAGNOSIS — R42 Dizziness and giddiness: Secondary | ICD-10-CM

## 2023-08-23 DIAGNOSIS — T162XXA Foreign body in left ear, initial encounter: Secondary | ICD-10-CM | POA: Insufficient documentation

## 2023-08-23 NOTE — Progress Notes (Signed)
 Patient ID: Andrew Bates, male   DOB: [birth year], 78 y.o.   MRN: [id number]  Follow-up: Recurrent dizziness, bilateral hearing loss, left ear Mnire's disease, recurrent headaches  HPI: The patient is a 78 year old male who returns today for his follow-up evaluation.  The patient was previously seen for multiple complex medical issues, including frequent recurrent dizziness, severe nausea, bilateral hearing loss, and recurrent headaches.  The patient has a history of severe left ear Mnire's disease.  He was treated with left endolymphatic sac decompression surgery in 2020, with good control of his dizziness for several years.  Over the past year, he has been having more recurrent dizziness.  The dizziness was often accompanied by severe headaches and nausea. He was diagnosed with possible vestibular migraine.  He was continued on his low-salt diet.  He was also treated with Valium and Zofran.  He was referred to a neurologist.  However, the patient did not see the neurologist due to his previous bad experience at [facility].  At a visit in early December 2024, his right Dix-Hallpike maneuver was positive.  He was successfully treated with a right Epley maneuver.  The patient returns today reporting significant improvement in his dizziness.  His headache has also mostly resolved.  He is still having hearing difficulty.  Currently he wears bilateral hearing aids.  Exam: General: Communicates without difficulty, well nourished, no acute distress. Head: Normocephalic, no evidence injury, no tenderness, facial buttresses intact without stepoff. Face/sinus: No tenderness to palpation and percussion. Facial movement is normal and symmetric. Eyes: PERRL, EOMI. No scleral icterus, conjunctivae clear. Neuro: CN II exam reveals vision grossly intact.  No nystagmus at any point of gaze. Ears: Auricles well formed without lesions.  A foreign body (a wax trap) is noted within the left ear canal.   The foreign body is likely a part of his hearing aid.  His tympanic membranes and middle ear spaces are noted to be normal.  Nose: External evaluation reveals normal support and skin without lesions.  Dorsum is intact.  Anterior rhinoscopy reveals congested mucosa over anterior aspect of inferior turbinates and intact septum.  No purulence noted. Oral:  Oral cavity and oropharynx are intact, symmetric, without erythema or edema.  Mucosa is moist without lesions. Neck: Full range of motion without pain.  There is no significant lymphadenopathy.  No masses palpable.  Thyroid bed within normal limits to palpation.  Parotid glands and submandibular glands equal bilaterally without mass.  Trachea is midline. Neuro:  CN 2-12 grossly intact.  Gait: Wide-based. Vestibular: Dix-Hallpike negative.  No nystagmus was noted with the right-sided positioning.  Vestibular: There is no nystagmus with pneumatic pressure on either tympanic membrane or Valsalva. The cerebellar examination is unremarkable.    Procedure: Left ear foreign body removal.   Anesthesia: None Description of the procedure: The patient is placed on a supine position.  Under the operating microscope, the left ear canal is examined.  A wax trap is noted to be impacted on the medial aspect of the ear canal.  Under the microscope, the foreign body is carefully removed with an alligator forceps and a 90 hook.  After foreign body removal, the tympanic membrane and middle ear space are noted to be normal.  The patient tolerated the procedure well.   Assessment: 1.  An incidental finding of a left ear foreign body. 2.  Recurrent dizziness, secondary to chronic left ear Mnire's disease.  He was previously treated with left endolymphatic sac decompression surgery, with  good control of his dizziness for several years. 3.  He was noted to have recurrent BPPV last year.  He was successfully treated with Epley maneuvers.  His Dix-Hallpike maneuver is negative  today. 4.  His ear canals, tympanic membranes, and middle ear spaces are noted to be normal. 5.  Subjectively stable bilateral high-frequency sensorineural hearing loss.  Plan: 1.  The physical exam findings are reviewed with the patient. 2.  Otomicroscopy with left ear foreign body removal. 3.  Continue with Valium as needed. 4.  Continue with his 1500 mg low-salt diet. 5.  The pathophysiology of Mnire's disease and BPPV are extensively discussed with the patient.  Questions are invited and answered. 6.  The patient will return for reevaluation in 6 months.

## 2023-09-01 ENCOUNTER — Other Ambulatory Visit: Payer: Self-pay | Admitting: Cardiology

## 2023-09-08 ENCOUNTER — Other Ambulatory Visit (INDEPENDENT_AMBULATORY_CARE_PROVIDER_SITE_OTHER): Payer: Self-pay | Admitting: Otolaryngology

## 2023-09-08 MED ORDER — DIAZEPAM 10 MG PO TABS: ORAL_TABLET | ORAL | 3 refills | Status: DC

## 2023-09-14 ENCOUNTER — Encounter (INDEPENDENT_AMBULATORY_CARE_PROVIDER_SITE_OTHER): Payer: Self-pay

## 2023-09-14 ENCOUNTER — Telehealth: Payer: Self-pay | Admitting: Cardiology

## 2023-09-14 ENCOUNTER — Ambulatory Visit (INDEPENDENT_AMBULATORY_CARE_PROVIDER_SITE_OTHER)

## 2023-09-14 VITALS — BP 146/87 | HR 60 | Ht 74.0 in | Wt 208.0 lb

## 2023-09-14 DIAGNOSIS — H903 Sensorineural hearing loss, bilateral: Secondary | ICD-10-CM

## 2023-09-14 DIAGNOSIS — H8102 Meniere's disease, left ear: Secondary | ICD-10-CM

## 2023-09-14 DIAGNOSIS — R42 Dizziness and giddiness: Secondary | ICD-10-CM | POA: Diagnosis not present

## 2023-09-14 NOTE — Telephone Encounter (Signed)
 I spoke with patient and advised him if he looses his vision again, he should go direct to the ED for evaluation. I also advised him that he should follow up with neurology and ENT. He has a call into Dr.Teoh's office already. He agreed with dispo.

## 2023-09-14 NOTE — Telephone Encounter (Signed)
 Patient says around 7:05 AM on Saturday he lost his vision for about 1 minute. He says before it happened he felt a rush of pressure that felt like liquid above his left ear and it radiated to his temple. He says he never lost consciousness, just lost vision temporarily. I could hear wife in background. She thinks it may have been a TIA. Patient says he was holding his dog and he and the dog fell. He says he's had issues with bending over and keeping balance. He says he's scheduled to have his ears checked because crystals are unaligned. Please advise.

## 2023-09-15 ENCOUNTER — Emergency Department (HOSPITAL_COMMUNITY)

## 2023-09-15 ENCOUNTER — Observation Stay (HOSPITAL_COMMUNITY)

## 2023-09-15 ENCOUNTER — Encounter (HOSPITAL_COMMUNITY): Payer: Self-pay | Admitting: Emergency Medicine

## 2023-09-15 ENCOUNTER — Other Ambulatory Visit: Payer: Self-pay

## 2023-09-15 ENCOUNTER — Observation Stay (HOSPITAL_COMMUNITY)
Admission: EM | Admit: 2023-09-15 | Discharge: 2023-09-16 | Disposition: A | Discharge status: Home / Self Care | Attending: Family Medicine | Admitting: Family Medicine

## 2023-09-15 DIAGNOSIS — G459 Transient cerebral ischemic attack, unspecified: Secondary | ICD-10-CM | POA: Diagnosis not present

## 2023-09-15 DIAGNOSIS — I639 Cerebral infarction, unspecified: Principal | ICD-10-CM

## 2023-09-15 DIAGNOSIS — Z7902 Long term (current) use of antithrombotics/antiplatelets: Secondary | ICD-10-CM | POA: Insufficient documentation

## 2023-09-15 DIAGNOSIS — I2510 Atherosclerotic heart disease of native coronary artery without angina pectoris: Secondary | ICD-10-CM | POA: Diagnosis not present

## 2023-09-15 DIAGNOSIS — K219 Gastro-esophageal reflux disease without esophagitis: Secondary | ICD-10-CM | POA: Diagnosis present

## 2023-09-15 DIAGNOSIS — H8102 Meniere's disease, left ear: Secondary | ICD-10-CM | POA: Diagnosis not present

## 2023-09-15 DIAGNOSIS — Z96651 Presence of right artificial knee joint: Secondary | ICD-10-CM | POA: Diagnosis not present

## 2023-09-15 DIAGNOSIS — Z86718 Personal history of other venous thrombosis and embolism: Secondary | ICD-10-CM | POA: Insufficient documentation

## 2023-09-15 DIAGNOSIS — I10 Essential (primary) hypertension: Secondary | ICD-10-CM | POA: Diagnosis present

## 2023-09-15 DIAGNOSIS — E785 Hyperlipidemia, unspecified: Secondary | ICD-10-CM | POA: Diagnosis not present

## 2023-09-15 DIAGNOSIS — R202 Paresthesia of skin: Secondary | ICD-10-CM | POA: Diagnosis present

## 2023-09-15 DIAGNOSIS — J45909 Unspecified asthma, uncomplicated: Secondary | ICD-10-CM | POA: Insufficient documentation

## 2023-09-15 DIAGNOSIS — I129 Hypertensive chronic kidney disease with stage 1 through stage 4 chronic kidney disease, or unspecified chronic kidney disease: Secondary | ICD-10-CM | POA: Diagnosis not present

## 2023-09-15 DIAGNOSIS — R001 Bradycardia, unspecified: Secondary | ICD-10-CM | POA: Diagnosis not present

## 2023-09-15 DIAGNOSIS — Z87891 Personal history of nicotine dependence: Secondary | ICD-10-CM | POA: Insufficient documentation

## 2023-09-15 DIAGNOSIS — N1832 Chronic kidney disease, stage 3b: Secondary | ICD-10-CM | POA: Diagnosis present

## 2023-09-15 DIAGNOSIS — Z7982 Long term (current) use of aspirin: Secondary | ICD-10-CM | POA: Insufficient documentation

## 2023-09-15 DIAGNOSIS — Z79899 Other long term (current) drug therapy: Secondary | ICD-10-CM | POA: Insufficient documentation

## 2023-09-15 LAB — COMPREHENSIVE METABOLIC PANEL
ALT: 15 U/L (ref 0–44)
AST: 18 U/L (ref 15–41)
Albumin: 4 g/dL (ref 3.5–5.0)
Alkaline Phosphatase: 70 U/L (ref 38–126)
Anion gap: 10 (ref 5–15)
BUN: 20 mg/dL (ref 8–23)
CO2: 24 mmol/L (ref 22–32)
Calcium: 9 mg/dL (ref 8.9–10.3)
Chloride: 105 mmol/L (ref 98–111)
Creatinine, Ser: 1.33 mg/dL — ABNORMAL HIGH (ref 0.61–1.24)
GFR, Estimated: 55 mL/min — ABNORMAL LOW (ref >60)
Glucose, Bld: 98 mg/dL (ref 70–99)
Potassium: 4.2 mmol/L (ref 3.5–5.1)
Sodium: 139 mmol/L (ref 135–145)
Total Bilirubin: 0.5 mg/dL (ref 0.0–1.2)
Total Protein: 6.5 g/dL (ref 6.5–8.1)

## 2023-09-15 LAB — CBC
HCT: 46.5 % (ref 39.0–52.0)
Hemoglobin: 15.5 g/dL (ref 13.0–17.0)
MCH: 33.4 pg (ref 26.0–34.0)
MCHC: 33.3 g/dL (ref 30.0–36.0)
MCV: 100.2 fL — ABNORMAL HIGH (ref 80.0–100.0)
Platelets: 202 10*3/uL (ref 150–400)
RBC: 4.64 MIL/uL (ref 4.22–5.81)
RDW: 12.7 % (ref 11.5–15.5)
WBC: 6.5 10*3/uL (ref 4.0–10.5)
nRBC: 0 % (ref 0.0–0.2)

## 2023-09-15 LAB — DIFFERENTIAL
Abs Immature Granulocytes: 0.01 10*3/uL (ref 0.00–0.07)
Basophils Absolute: 0.1 10*3/uL (ref 0.0–0.1)
Basophils Relative: 1 %
Eosinophils Absolute: 0.2 10*3/uL (ref 0.0–0.5)
Eosinophils Relative: 3 %
Immature Granulocytes: 0 %
Lymphocytes Relative: 23 %
Lymphs Abs: 1.5 10*3/uL (ref 0.7–4.0)
Monocytes Absolute: 0.6 10*3/uL (ref 0.1–1.0)
Monocytes Relative: 9 %
Neutro Abs: 4.1 10*3/uL (ref 1.7–7.7)
Neutrophils Relative %: 64 %

## 2023-09-15 LAB — APTT: aPTT: 25 s (ref 24–36)

## 2023-09-15 LAB — PROTIME-INR
INR: 1 (ref 0.8–1.2)
Prothrombin Time: 13.2 s (ref 11.4–15.2)

## 2023-09-15 LAB — ETHANOL: Alcohol, Ethyl (B): <10 mg/dL (ref <10)

## 2023-09-15 MED ORDER — KETOROLAC TROMETHAMINE 15 MG/ML IJ SOLN
15.0000 mg | Freq: Once | INTRAMUSCULAR | Status: AC
  Administered 2023-09-15: 15 mg via INTRAVENOUS
  Filled 2023-09-15: qty 1

## 2023-09-15 MED ORDER — GADOBUTROL 1 MMOL/ML IV SOLN
10.0000 mL | Freq: Once | INTRAVENOUS | Status: AC | PRN
Start: 2023-09-15 — End: 2023-09-15
  Administered 2023-09-15: 10 mL via INTRAVENOUS

## 2023-09-15 MED ORDER — ONDANSETRON HCL 4 MG/2ML IJ SOLN: 4.0000 mg | Freq: Four times a day (QID) | INTRAMUSCULAR | Status: DC | PRN

## 2023-09-15 MED ORDER — ASPIRIN 81 MG PO TBEC
81.0000 mg | DELAYED_RELEASE_TABLET | Freq: Every day | ORAL | Status: DC
  Administered 2023-09-16: 81 mg via ORAL
  Filled 2023-09-15: qty 1

## 2023-09-15 MED ORDER — ACETAMINOPHEN 650 MG RE SUPP: 650.0000 mg | Freq: Four times a day (QID) | RECTAL | Status: DC | PRN

## 2023-09-15 MED ORDER — ENOXAPARIN SODIUM 40 MG/0.4ML IJ SOSY
40.0000 mg | PREFILLED_SYRINGE | INTRAMUSCULAR | Status: DC
  Administered 2023-09-15: 40 mg via SUBCUTANEOUS
  Filled 2023-09-15: qty 0.4

## 2023-09-15 MED ORDER — CLOPIDOGREL BISULFATE 75 MG PO TABS
75.0000 mg | ORAL_TABLET | Freq: Every day | ORAL | Status: DC
  Administered 2023-09-16: 75 mg via ORAL
  Filled 2023-09-15: qty 1

## 2023-09-15 MED ORDER — ONDANSETRON HCL 4 MG PO TABS: 4.0000 mg | ORAL_TABLET | Freq: Four times a day (QID) | ORAL | Status: DC | PRN

## 2023-09-15 MED ORDER — HYDROCODONE-ACETAMINOPHEN 5-325 MG PO TABS
1.0000 | ORAL_TABLET | Freq: Once | ORAL | Status: DC
  Filled 2023-09-15: qty 1

## 2023-09-15 MED ORDER — METOCLOPRAMIDE HCL 5 MG/ML IJ SOLN
10.0000 mg | Freq: Once | INTRAMUSCULAR | Status: AC
  Administered 2023-09-15: 10 mg via INTRAVENOUS
  Filled 2023-09-15: qty 2

## 2023-09-15 MED ORDER — ISOSORBIDE MONONITRATE ER 60 MG PO TB24
30.0000 mg | ORAL_TABLET | Freq: Every day | ORAL | Status: DC
  Administered 2023-09-16: 30 mg via ORAL
  Filled 2023-09-15: qty 1

## 2023-09-15 MED ORDER — PANTOPRAZOLE SODIUM 40 MG PO TBEC
40.0000 mg | DELAYED_RELEASE_TABLET | Freq: Every day | ORAL | Status: DC
  Administered 2023-09-16: 40 mg via ORAL
  Filled 2023-09-15: qty 1

## 2023-09-15 MED ORDER — ACETAMINOPHEN 325 MG PO TABS
650.0000 mg | ORAL_TABLET | Freq: Four times a day (QID) | ORAL | Status: DC | PRN
  Administered 2023-09-16 (×2): 650 mg via ORAL
  Filled 2023-09-15 (×2): qty 2

## 2023-09-15 MED ORDER — ROSUVASTATIN CALCIUM 20 MG PO TABS: 20.0000 mg | ORAL_TABLET | Freq: Every day | ORAL | Status: DC

## 2023-09-15 MED ORDER — ASPIRIN 325 MG PO TABS
325.0000 mg | ORAL_TABLET | Freq: Once | ORAL | Status: AC
  Administered 2023-09-15: 325 mg via ORAL
  Filled 2023-09-15: qty 1

## 2023-09-15 MED ORDER — POLYETHYLENE GLYCOL 3350 17 G PO PACK: 17.0000 g | PACK | ORAL | Status: DC | PRN

## 2023-09-15 MED ORDER — DIAZEPAM 5 MG PO TABS: 10.0000 mg | ORAL_TABLET | Freq: Three times a day (TID) | ORAL | Status: DC | PRN

## 2023-09-15 MED ORDER — ONDANSETRON HCL 4 MG/2ML IJ SOLN
4.0000 mg | Freq: Once | INTRAMUSCULAR | Status: AC
  Administered 2023-09-15: 4 mg via INTRAVENOUS
  Filled 2023-09-15: qty 2

## 2023-09-15 MED ORDER — LOSARTAN POTASSIUM 50 MG PO TABS
50.0000 mg | ORAL_TABLET | Freq: Every day | ORAL | Status: DC
  Administered 2023-09-16: 50 mg via ORAL
  Filled 2023-09-15: qty 1

## 2023-09-15 NOTE — ED Notes (Signed)
 Patient requesting for nausea, Burgess Amor PA-C notified.

## 2023-09-15 NOTE — ED Notes (Signed)
 Hospitalist at bedside

## 2023-09-15 NOTE — Progress Notes (Addendum)
   09/15/23 1731  TOC Brief Assessment  Insurance and Status Reviewed  Patient has primary care physician Yes  Home environment has been reviewed From home c/wife.  Prior level of function: Independent.  Prior/Current Home Services No current home services  Social Drivers of Health Review SDOH reviewed no interventions necessary  Readmission risk has been reviewed Yes  Transition of care needs no transition of care needs at this time   Transition of Care Department [facility]) has reviewed patient and no other TOC needs have been identified at this time. We will continue to monitor patient advancement through interdisciplinary progression rounds. If new patient needs arise, please place a TOC consult.   [facility] notification ID # [id number].

## 2023-09-15 NOTE — Care Management Obs Status (Signed)
 MEDICARE OBSERVATION STATUS NOTIFICATION   Patient Details  Name: Andrew Bates MRN: [id number] Date of Birth: 03-29-1946   Medicare Observation Status Notification Given:  Yes    Barron Alvine, RN 09/15/2023, 5:01 PM

## 2023-09-15 NOTE — ED Notes (Signed)
 Pt currently asleep, will defer NIH till pt wakes up.

## 2023-09-15 NOTE — H&P (Addendum)
 History and Physical    Andrew Bates RUE:[id number] DOB: 10-28-1945 DOA: 09/15/2023  PCP: Assunta Found, MD   Patient coming from: Home  Chief Complaint: Near syncope/visual deficits/dizziness  HPI: Andrew Bates is a 78 y.o. male with medical history significant for hypertension, dyslipidemia, CAD, CKD stage IIIa, GERD, peripheral neuropathy, and Mnire's disease of left ear with history of vertigo who presented to the ED with worsening symptoms of transient visual loss as well as persistent left facial numbness and persistent abnormal gait.  Apparently, emergently symptoms started about 3 days ago on Saturday when he had vision loss with complete blackout that lasted about 5 minutes.  This caused him to fall and strike his left scalp on a door manage and now he has pain to this area.  He also states that he had some drifting to the right when he was walking.  He required help by his wife and had some nausea and took some Zofran and diazepam with minimal improvement.  He saw his ENT yesterday who stated that he likely had a TIA versus possible CVA and told him to go to the ED for further evaluation since Apley testing did not seem to confirm his usual vertigo/Mnire's symptoms.  He has had some persistent numbness to his left face and felt less coordinated in his legs.  He denies any other focal deficits, but does state that his vision has been more blurry.   ED Course: Vital signs stable, but with some bradycardia noted on EKG.  Creatinine 1.33 and at baseline.  Brain MRI negative for any acute findings.  Review of Systems: Reviewed as noted above, otherwise negative.  Past Medical History:  Diagnosis Date   Adenomatous colon polyp    Anxiety    Arthritis    Asthma    Coronary artery disease    Nonobstructive at cardiac catheterization 2020   Depression    DVT (deep venous thrombosis) (HCC) 2008   Essential hypertension    GERD (gastroesophageal reflux disease)    History of kidney  stones    Hyperlipidemia    Internal hemorrhoids    Peripheral neuropathy    PONV (postoperative nausea and vomiting)    PTSD (post-traumatic stress disorder)    Sleep apnea    cpap   Vertigo     Past Surgical History:  Procedure Laterality Date   ANTERIOR CERVICAL DECOMP/DISCECTOMY FUSION N/A 07/09/2015   Procedure: ANTERIOR CERVICAL DECOMPRESSION/DISCECTOMY FUSION CERVICAL FIVE-SIX,CERVICAL SIX-SEVEN;  Surgeon: Tressie Stalker, MD;  Location: [facility];  Service: Neurosurgery;  Laterality: N/A;   CARPAL TUNNEL RELEASE Bilateral 2014   CATARACT EXTRACTION W/PHACO Right 08/08/2022   Procedure: CATARACT EXTRACTION PHACO AND INTRAOCULAR LENS PLACEMENT (IOC);  Surgeon: Fabio Pierce, MD;  Location: [facility];  Service: Ophthalmology;  Laterality: Right;  CDE 9.66   CATARACT EXTRACTION W/PHACO Left 08/22/2022   Procedure: CATARACT EXTRACTION PHACO AND INTRAOCULAR LENS PLACEMENT (IOC);  Surgeon: Fabio Pierce, MD;  Location: [facility];  Service: Ophthalmology;  Laterality: Left;  CDE  7.65   COLONOSCOPY     CORONARY PRESSURE/FFR STUDY N/A 06/25/2021   Procedure: INTRAVASCULAR PRESSURE WIRE/FFR STUDY;  Surgeon: Marykay Lex, MD;  Location: [facility] INVAS[facility] CV LAB;  Service: Cardiovascular;  Laterality: N/A;   CYSTOSCOPY/RETROGRADE/URETEROSCOPY/STONE EXTRACTION WITH BASKET Left 02/14/2014   Procedure: CYSTOSCOPY AND LEFT DOUBLE J STENT PLACEMENT;  ATTEMPTED STONE  EXTRACTION ;  Surgeon: Chelsea Aus, MD;  Location: [facility];  Service: Urology;  Laterality: Left;   ENDOLYMPHATIC SAC  DECOMPRESSION Left 06/14/2019   Procedure: [facility] DECOMPRESSION;  Surgeon: Newman Pies, MD;  Location: [facility];  Service: ENT;  Laterality: Left;   ESOPHAGOGASTRODUODENOSCOPY ENDOSCOPY     HEMORRHOID BANDING     KNEE SURGERY Right 2006   Arthroscopy - MCl tear   LEFT HEART CATH AND CORONARY ANGIOGRAPHY N/A 08/17/2018   Procedure: LEFT HEART CATH AND CORONARY ANGIOGRAPHY;  Surgeon:  Kathleene Hazel, MD;  Location: [facility] INVAS[facility] CV LAB;  Service: Cardiovascular;  Laterality: N/A;   LEFT HEART CATH AND CORONARY ANGIOGRAPHY N/A 06/25/2021   Procedure: LEFT HEART CATH AND CORONARY ANGIOGRAPHY;  Surgeon: Marykay Lex, MD;  Location: [facility] INVASIVE CV LAB;  Service: Cardiovascular;  Laterality: N/A;   UPPER GASTROINTESTINAL ENDOSCOPY       reports that he quit smoking about 31 years ago. His smoking use included cigars. He has never used smokeless tobacco. He reports that he does not drink alcohol and does not use drugs.  Allergies  Allergen Reactions   Other Nausea And Vomiting and Other (See Comments)    "STRONG" PAIN MEDICATIONS IN GENERAL: HEADACHE   Tamsulosin Anaphylaxis   Codeine Nausea And Vomiting    REACTION: Headache   Morphine And Codeine Nausea And Vomiting   Metoprolol Itching   Oxybutynin Swelling   Pentazocine-Naloxone Hcl Nausea And Vomiting   Sertraline Swelling    Swelling in knee 100 mg   Topiramate Other (See Comments)    Headache    Family History  Problem Relation Age of Onset   Hypertension Father    Hyperlipidemia Father    Lung cancer Father        from piece of abestos lodged in his lung   Thyroid cancer Mother    Colon cancer Brother 34   Colon polyps Brother    Bladder Cancer Brother    Esophageal cancer Neg Hx    Stomach cancer Neg Hx    Rectal cancer Neg Hx     Prior to Admission medications   Medication Sig Start Date End Date Taking? Authorizing Provider  acetaminophen (TYLENOL) 500 MG tablet Take 500 mg by mouth every 6 (six) hours as needed for fever.   Yes [provider]  Arginine 500 MG CAPS Take by mouth.   Yes [provider]  Artificial Tear Ointment (DRY EYES OP) Apply 1 application to eye as needed (dry eyes).    Yes [provider]  aspirin EC 81 MG EC tablet Take 1 tablet (81 mg total) by mouth daily. 08/19/18  Yes Georgie Chard D, NP  Biotin 5 MG CAPS Take 10 mg by mouth  daily.    Yes [provider]  diazepam (VALIUM) 10 MG tablet Take 1/2 tablet by mouth 3 times daily as needed for dizziness. Patient taking differently: Take 10 mg by mouth as needed for anxiety (vertigo). For dizziness 09/08/23  Yes Newman Pies, MD  isosorbide mononitrate (IMDUR) 30 MG 24 hr tablet Take 1 tablet (30 mg total) by mouth in the morning and at bedtime. 01/27/23 09/15/23 Yes Jonelle Sidle, MD  losartan (COZAAR) 50 MG tablet TAKE 1 TABLET BY MOUTH TWICE A DAILY. 06/09/23  Yes Jonelle Sidle, MD  nitroGLYCERIN (NITROSTAT) 0.4 MG SL tablet DISSOLVE 1 TABLET SUBLINGUALLY AS NEEDED FOR CHEST PAIN, MAY REPEAT EVERY 5 MINUTES. AFTER 3 CALL 911. 09/01/23  Yes Jonelle Sidle, MD  omeprazole (PRILOSEC) 40 MG capsule TAKE ONE CAPSULE BY MOUTH ONCE A DAY FOR HEARTBURN (TAKE  ON AN EMPTY STOMACH 30 MINUTES PRIOR TO A MEAL) 09/17/21  Yes [provider]  ondansetron (ZOFRAN ODT) 4 MG disintegrating tablet Take 1 tablet (4 mg total) by mouth every 8 (eight) hours as needed for nausea or vomiting. 04/29/19  Yes Amin, Ankit C, MD  polyethylene glycol (MIRALAX / GLYCOLAX) 17 g packet Take 17 g by mouth as needed for moderate constipation.   Yes [provider]  rosuvastatin (CRESTOR) 20 MG tablet TAKE 1 TABLET BY MOUTH AT 6PM. 08/20/22  Yes Jonelle Sidle, MD  Wheat Dextrin (BENEFIBER DRINK MIX PO) Take by mouth as needed (constipation).   Yes [provider]    Physical Exam: Vitals:   09/15/23 1130 09/15/23 1200 09/15/23 1314 09/15/23 1315  BP: 132/85 (!) 145/92  (!) 167/90  Pulse: (!) 46 (!) 46  (!) 52  Resp: 19 19  (!) 22  Temp:   97.9 F (36.6 C)   TempSrc:   Oral   SpO2: 94% 96%  96%  Weight:      Height:        Constitutional: NAD, calm, comfortable Vitals:   09/15/23 1130 09/15/23 1200 09/15/23 1314 09/15/23 1315  BP: 132/85 (!) 145/92  (!) 167/90  Pulse: (!) 46 (!) 46  (!) 52  Resp: 19 19  (!) 22  Temp:   97.9 F (36.6 C)   TempSrc:    Oral   SpO2: 94% 96%  96%  Weight:      Height:       Eyes: lids and conjunctivae normal Neck: normal, supple Respiratory: clear to auscultation bilaterally. Normal respiratory effort. No accessory muscle use.  Cardiovascular: Regular rate and rhythm, no murmurs. Abdomen: no tenderness, no distention. Bowel sounds positive.  Musculoskeletal:  No edema. Skin: no rashes, lesions, ulcers.  Psychiatric: Flat affect  Labs on Admission: I have personally reviewed following labs and imaging studies  CBC: Recent Labs  Lab 09/15/23 1006  WBC 6.5  NEUTROABS 4.1  HGB 15.5  HCT 46.5  MCV 100.2*  PLT 202   Basic Metabolic Panel: Recent Labs  Lab 09/15/23 1006  NA 139  K 4.2  CL 105  CO2 24  GLUCOSE 98  BUN 20  CREATININE 1.33*  CALCIUM 9.0   GFR: Estimated Creatinine Clearance: 54.9 mL/min (A) (by C-G formula based on SCr of 1.33 mg/dL (H)). Liver Function Tests: Recent Labs  Lab 09/15/23 1006  AST 18  ALT 15  ALKPHOS 70  BILITOT 0.5  PROT 6.5  ALBUMIN 4.0   No results for input(s): "LIPASE", "AMYLASE" in the last 168 hours. No results for input(s): "AMMONIA" in the last 168 hours. Coagulation Profile: Recent Labs  Lab 09/15/23 1006  INR 1.0   Cardiac Enzymes: No results for input(s): "CKTOTAL", "CKMB", "CKMBINDEX", "TROPONINI" in the last 168 hours. BNP (last 3 results) No results for input(s): "PROBNP" in the last 8760 hours. HbA1C: No results for input(s): "HGBA1C" in the last 72 hours. CBG: No results for input(s): "GLUCAP" in the last 168 hours. Lipid Profile: No results for input(s): "CHOL", "HDL", "LDLCALC", "TRIG", "CHOLHDL", "LDLDIRECT" in the last 72 hours. Thyroid Function Tests: No results for input(s): "TSH", "T4TOTAL", "FREET4", "T3FREE", "THYROIDAB" in the last 72 hours. Anemia Panel: No results for input(s): "VITAMINB12", "FOLATE", "FERRITIN", "TIBC", "IRON", "RETICCTPCT" in the last 72 hours. Urine analysis:    Component Value Date/Time    COLORURINE YELLOW 04/27/2019 2120   APPEARANCEUR Clear 06/02/2023 1443   LABSPEC 1.024 04/27/2019 2120  PHURINE 5.0 04/27/2019 2120   GLUCOSEU Negative 06/02/2023 1443   GLUCOSEU NEGATIVE 05/02/2014 1529   HGBUR NEGATIVE 04/27/2019 2120   BILIRUBINUR Negative 06/02/2023 1443   KETONESUR 5 (A) 04/27/2019 2120   PROTEINUR Negative 06/02/2023 1443   PROTEINUR 30 (A) 04/27/2019 2120   UROBILINOGEN 0.2 02/25/2015 1659   NITRITE Negative 06/02/2023 1443   NITRITE NEGATIVE 04/27/2019 2120   LEUKOCYTESUR Negative 06/02/2023 1443   LEUKOCYTESUR NEGATIVE 04/27/2019 2120    Radiological Exams on Admission: DG Hand Complete Right Result Date: 09/15/2023 CLINICAL DATA:  pain and swelling since fall. EXAM: RIGHT HAND - COMPLETE 3+ VIEW COMPARISON:  None Available. FINDINGS: No acute fracture or dislocation. No aggressive osseous lesion. There are mild diffuse degenerative changes of the imaged joints with asymmetric moderate involvement of distal interphalangeal joints and severe involvement of first carpometacarpal joint. No radiopaque foreign bodies. Soft tissues are within normal limits. IMPRESSION: No acute osseous abnormality of the right hand. Electronically Signed   By: Jules Schick M.D.   On: 09/15/2023 12:28   MR Brain W and Wo Contrast Result Date: 09/15/2023 CLINICAL DATA:  Neuro deficit, acute, stroke suspected EXAM: MRI HEAD WITHOUT AND WITH CONTRAST TECHNIQUE: Multiplanar, multiecho pulse sequences of the brain and surrounding structures were obtained without and with intravenous contrast. CONTRAST:  10mL GADAVIST GADOBUTROL 1 MMOL/ML IV SOLN COMPARISON:  Head CT 03/14/2023.  MRI 04/28/2019. FINDINGS: Brain: Diffusion imaging does not show any acute or subacute infarction or other cause of restricted diffusion. Minimal chronic small-vessel change affects pons. No focal cerebellar finding. Cerebral hemispheres show low moderate chronic small-vessel ischemic changes of the white matter,  similar to the study of November 2020, only minimally progressive. No cortical or large vessel territory stroke. No mass, hemorrhage, hydrocephalus or extra-axial collection. After contrast administration, no abnormal enhancement occurs. Vascular: Major vessels at the base of the brain show flow. Skull and upper cervical spine: Negative Sinuses/Orbits: Clear/normal Other: None IMPRESSION: No acute or reversible finding. Low moderate chronic small-vessel ischemic changes of the cerebral hemispheric white matter, similar to the study of November 2020, only minimally progressive. Electronically Signed   By: Paulina Fusi M.D.   On: 09/15/2023 11:00    EKG: Independently reviewed.  Sinus bradycardia 48 bpm  Assessment/Plan Principal Problem:   TIA (transient ischemic attack) Active Problems:   Stage 3b chronic kidney disease (CKD) (HCC)   Essential hypertension   Hyperlipidemia with target LDL less than 70   GERD (gastroesophageal reflux disease)   Meniere's disease of left ear    Possible stroke negative MRI versus Mnire's complication with vertigo/near syncope -Appreciate neurology evaluation -Consider vestibular rehab with PT -Diazepam as needed for dizziness -Zofran for nausea or vomiting -Ultrasound carotids with 2D echocardiogram  Sinus bradycardia -Monitor on telemetry -Does not appear to be on AV nodal blockade agents -2D echocardiogram as noted above  CKD 3a -Creatinine 1.3 and appears to be at baseline, continue to monitor  History of CAD -Continue home aspirin and statin  Hypertension -Continue home blood pressure agents  Dyslipidemia -Continue statin   DVT prophylaxis: Lovenox Code Status: Full Family Communication: None at bedside Disposition Plan: Evaluation for TIA/CVA/vertigo Consults called: Neurology Admission status: Observation, telemetry  Severity of Illness: The appropriate patient status for this patient is OBSERVATION. Observation status is judged  to be reasonable and necessary in order to provide the required intensity of service to ensure the patient's safety. The patient's presenting symptoms, physical exam findings, and initial radiographic and laboratory data  in the context of their medical condition is felt to place them at decreased risk for further clinical deterioration. Furthermore, it is anticipated that the patient will be medically stable for discharge from the hospital within 2 midnights of admission.    Deshawnda Acrey D Sherryll Burger DO [facility]  If 7PM-7AM, please contact night-coverage [email or web address]  09/15/2023, 2:44 PM

## 2023-09-15 NOTE — ED Triage Notes (Signed)
 Pt bib pov from pcp w/ c/o a syncopal event occurring on Saturday. Pt was walking in his home with his dog in hand. Pt reports he felt a "bubble" move across his forehead. He reports next everything went black and he hit the floor. He does not believe he lost consciousness. He used his right arm to break his fall. He has a bruise on in right wrist. Pt reports a long scratch from his abdomen up his chest that is superficial. Pt did hit his head on the floor. RN does not visualize any brusing, swelling or broken skin on his head. Pt does endorse tenderness. Pt reports he had significant blurry vision that has improved some. RN does not note any deficits in peripheral vision. Pt is reporting that he feels a pull to his right and has numbness from his head injury to mid ear on his face. RN does not note any weakness or deficits in the upper body but does note slight weakness in the right leg. Pt reports a headache that has not subsided with medication. Pt reports nausea and dizziness that has also not subsided with intervention. (Zofran and diazepam) Pt has a history of vertigo, angina (requiring nitroglycerin the evening of this event.   Pt went to see his ENT yesterday to have his crystals evaluated" that was negative. Pt called his cardiologist Monday and was advised to come to the ED. He did not want to wait.  Pt spoke with Dr Phillips Odor and was urged to come to the ED immediately.

## 2023-09-15 NOTE — ED Notes (Signed)
 Patient transported to MRI

## 2023-09-15 NOTE — ED Notes (Signed)
 Pt requesting pain med for HA, this RN Notified EDP

## 2023-09-15 NOTE — ED Provider Notes (Signed)
 [place] [facility]ENCY DEPARTMENT AT [facility] Provider Note   CSN: [id number] Arrival date & time: 09/15/23  0865     History  Chief Complaint  Patient presents with   Near Syncope    Andrew Bates is a 78 y.o. male with a history including hypertension, hyperlipidemia, CAD, chronic kidney disease, Mnire's disease of left ear, including history of vertigo, GERD, peripheral neuropathy, history of DVT presenting for evaluation of a transient loss of vision with persistent left facial numbness and persistent abnormal gait,  describing his body wants to persistently "drift to the right" when walking.  He was walking in his home 3 days ago when he developed complete loss of vision which lasted about 3-5 minutes,  causing him to fall and strike his left scalp on a door edge.  He required help by his wife getting up, after which he had nausea an episode of his true vertigo that night which improved after taking zofran and diazepam.  He continues to sx as mentioned above but the classic spinning vertigo sx have resolved.  Saw his ENT ytd who confirmed he does not have vertigo or Menieres complication but told him he probably had a tia vs cva.  He currently has persistent numbness left face, and no obvious lower extremity weakness, but has felt less coordinated in his legs,  states he fell over an end table yesterday, denies complete fall with this event.  No further vision loss but vision has been a little more blurry than normal.   The history is provided by the patient.       Home Medications Prior to Admission medications   Medication Sig Start Date End Date Taking? Authorizing Provider  acetaminophen (TYLENOL) 500 MG tablet Take 500 mg by mouth every 6 (six) hours as needed for fever.   Yes [provider]  Arginine 500 MG CAPS Take by mouth.   Yes [provider]  Artificial Tear Ointment (DRY EYES OP) Apply 1 application to eye as needed (dry eyes).    Yes  [provider]  aspirin EC 81 MG EC tablet Take 1 tablet (81 mg total) by mouth daily. 08/19/18  Yes Georgie Chard D, NP  Biotin 5 MG CAPS Take 10 mg by mouth daily.    Yes [provider]  diazepam (VALIUM) 10 MG tablet Take 1/2 tablet by mouth 3 times daily as needed for dizziness. Patient taking differently: Take 10 mg by mouth as needed for anxiety (vertigo). For dizziness 09/08/23  Yes Newman Pies, MD  isosorbide mononitrate (IMDUR) 30 MG 24 hr tablet Take 1 tablet (30 mg total) by mouth in the morning and at bedtime. 01/27/23 09/15/23 Yes Jonelle Sidle, MD  losartan (COZAAR) 50 MG tablet TAKE 1 TABLET BY MOUTH TWICE A DAILY. 06/09/23  Yes Jonelle Sidle, MD  nitroGLYCERIN (NITROSTAT) 0.4 MG SL tablet DISSOLVE 1 TABLET SUBLINGUALLY AS NEEDED FOR CHEST PAIN, MAY REPEAT EVERY 5 MINUTES. AFTER 3 CALL 911. 09/01/23  Yes Jonelle Sidle, MD  omeprazole (PRILOSEC) 40 MG capsule TAKE ONE CAPSULE BY MOUTH ONCE A DAY FOR HEARTBURN (TAKE ON AN EMPTY STOMACH 30 MINUTES PRIOR TO A MEAL) 09/17/21  Yes [provider]  ondansetron (ZOFRAN ODT) 4 MG disintegrating tablet Take 1 tablet (4 mg total) by mouth every 8 (eight) hours as needed for nausea or vomiting. 04/29/19  Yes Amin, Ankit C, MD  polyethylene glycol (MIRALAX / GLYCOLAX) 17 g packet Take 17 g by mouth  as needed for moderate constipation.   Yes [provider]  rosuvastatin (CRESTOR) 20 MG tablet TAKE 1 TABLET BY MOUTH AT 6PM. 08/20/22  Yes Jonelle Sidle, MD  Wheat Dextrin (BENEFIBER DRINK MIX PO) Take by mouth as needed (constipation).   Yes [provider]      Allergies    Other, Tamsulosin, Codeine, Morphine and codeine, Metoprolol, Oxybutynin, Pentazocine-naloxone hcl, Sertraline, and Topiramate    Review of Systems   Review of Systems  Constitutional:  Negative for chills and fever.  HENT:  Negative for congestion and sore throat.   Eyes: Negative.   Respiratory:  Negative for chest  tightness and shortness of breath.   Cardiovascular:  Negative for chest pain.  Gastrointestinal:  Positive for nausea. Negative for abdominal pain.  Genitourinary: Negative.   Musculoskeletal:  Positive for gait problem. Negative for arthralgias, joint swelling and neck pain.  Skin: Negative.  Negative for rash and wound.  Neurological:  Positive for dizziness, numbness and headaches. Negative for weakness and light-headedness.  Psychiatric/Behavioral: Negative.      Physical Exam Updated Vital Signs BP (!) 167/90   Pulse (!) 52   Temp 97.9 F (36.6 C) (Oral)   Resp (!) 22   Ht 6\' 2"  (1.88 m)   Wt 95 kg   SpO2 96%   BMI 26.89 kg/m  Physical Exam Vitals and nursing note reviewed.  Constitutional:      Appearance: He is well-developed.  HENT:     Head: Normocephalic and atraumatic.     Right Ear: Tympanic membrane normal.     Left Ear: Tympanic membrane normal.  Eyes:     Extraocular Movements: Extraocular movements intact.     Pupils: Pupils are equal, round, and reactive to light.  Cardiovascular:     Rate and Rhythm: Regular rhythm. Bradycardia present.     Heart sounds: Normal heart sounds.  Pulmonary:     Effort: Pulmonary effort is normal.  Abdominal:     Palpations: Abdomen is soft.     Tenderness: There is no abdominal tenderness.  Musculoskeletal:        General: Swelling present. No deformity. Normal range of motion.     Cervical back: Normal range of motion and neck supple.     Comments: Mild edema and bruising noted right dorsal and volar hand around the thumb, extending to the wrist.  Lymphadenopathy:     Cervical: No cervical adenopathy.  Skin:    General: Skin is warm and dry.     Findings: No rash.  Neurological:     General: No focal deficit present.     Mental Status: He is alert and oriented to person, place, and time.     GCS: GCS eye subscore is 4. GCS verbal subscore is 5. GCS motor subscore is 6.     Cranial Nerves: Cranial nerve deficit and  facial asymmetry present.     Sensory: No sensory deficit.     Motor: No pronator drift.     Coordination: Coordination normal. Finger-Nose-Finger Test and Heel to [facility] Test normal.     Gait: Gait abnormal.     Deep Tendon Reflexes: Reflexes normal.     Comments: Normal heel-shin, normal rapid alternating movements, but slow and deliberate. Cranial nerves III-XII intact.  No pronator drift. Slight left facial droop around the mouth,  forehead, eyelid sparing. Decreased sensation to touch left cheek.  Equal lower extremity strength.  Left ankle appears to fatigue quicker than right.  Pt stumbled on left foot after 2 steps of ambulation in room.   Psychiatric:        Speech: Speech normal.        Behavior: Behavior normal.        Thought Content: Thought content normal.     ED Results / Procedures / Treatments   Labs (all labs ordered are listed, but only abnormal results are displayed) Labs Reviewed  CBC - Abnormal; Notable for the following components:      Result Value   MCV 100.2 (*)    All other components within normal limits  COMPREHENSIVE METABOLIC PANEL - Abnormal; Notable for the following components:   Creatinine, Ser 1.33 (*)    GFR, Estimated 55 (*)    All other components within normal limits  PROTIME-INR  APTT  DIFFERENTIAL  ETHANOL  RAPID URINE DRUG SCREEN, HOSP PERFORMED  URINALYSIS, ROUTINE W REFLEX MICROSCOPIC  CBC  CREATININE, SERUM    EKG EKG Interpretation Date/Time:  Tuesday September 15 2023 10:54:12 EDT Ventricular Rate:  48 PR Interval:  170 QRS Duration:  126 QT Interval:  463 QTC Calculation: 414 R Axis:   -22  Text Interpretation: Sinus bradycardia Nonspecific intraventricular conduction delay No acute changes No significant change since last tracing Confirmed by Derwood Kaplan [phone]) on 09/15/2023 11:05:29 AM  Radiology DG Hand Complete Right Result Date: 09/15/2023 CLINICAL DATA:  pain and swelling since fall. EXAM: RIGHT HAND - COMPLETE 3+  VIEW COMPARISON:  None Available. FINDINGS: No acute fracture or dislocation. No aggressive osseous lesion. There are mild diffuse degenerative changes of the imaged joints with asymmetric moderate involvement of distal interphalangeal joints and severe involvement of first carpometacarpal joint. No radiopaque foreign bodies. Soft tissues are within normal limits. IMPRESSION: No acute osseous abnormality of the right hand. Electronically Signed   By: Jules Schick M.D.   On: 09/15/2023 12:28   MR Brain W and Wo Contrast Result Date: 09/15/2023 CLINICAL DATA:  Neuro deficit, acute, stroke suspected EXAM: MRI HEAD WITHOUT AND WITH CONTRAST TECHNIQUE: Multiplanar, multiecho pulse sequences of the brain and surrounding structures were obtained without and with intravenous contrast. CONTRAST:  10mL GADAVIST GADOBUTROL 1 MMOL/ML IV SOLN COMPARISON:  Head CT 03/14/2023.  MRI 04/28/2019. FINDINGS: Brain: Diffusion imaging does not show any acute or subacute infarction or other cause of restricted diffusion. Minimal chronic small-vessel change affects pons. No focal cerebellar finding. Cerebral hemispheres show low moderate chronic small-vessel ischemic changes of the white matter, similar to the study of November 2020, only minimally progressive. No cortical or large vessel territory stroke. No mass, hemorrhage, hydrocephalus or extra-axial collection. After contrast administration, no abnormal enhancement occurs. Vascular: Major vessels at the base of the brain show flow. Skull and upper cervical spine: Negative Sinuses/Orbits: Clear/normal Other: None IMPRESSION: No acute or reversible finding. Low moderate chronic small-vessel ischemic changes of the cerebral hemispheric white matter, similar to the study of November 2020, only minimally progressive. Electronically Signed   By: Paulina Fusi M.D.   On: 09/15/2023 11:00    Procedures Procedures    Medications Ordered in ED Medications  aspirin EC tablet 81 mg  (has no administration in time range)  isosorbide mononitrate (IMDUR) 24 hr tablet 30 mg (has no administration in time range)  losartan (COZAAR) tablet 50 mg (has no administration in time range)  rosuvastatin (CRESTOR) tablet 20 mg (has no administration in time range)  diazepam (VALIUM) tablet 10 mg (has no administration in time range)  pantoprazole (PROTONIX)  EC tablet 40 mg (has no administration in time range)  polyethylene glycol (MIRALAX / GLYCOLAX) packet 17 g (has no administration in time range)  enoxaparin (LOVENOX) injection 40 mg (has no administration in time range)  acetaminophen (TYLENOL) tablet 650 mg (has no administration in time range)    Or  acetaminophen (TYLENOL) suppository 650 mg (has no administration in time range)  ondansetron (ZOFRAN) tablet 4 mg (has no administration in time range)    Or  ondansetron (ZOFRAN) injection 4 mg (has no administration in time range)  ondansetron (ZOFRAN) injection 4 mg (4 mg Intravenous Given 09/15/23 0927)  gadobutrol (GADAVIST) 1 MMOL/ML injection 10 mL (10 mLs Intravenous Contrast Given 09/15/23 1025)  metoCLOPramide (REGLAN) injection 10 mg (10 mg Intravenous Given 09/15/23 1309)  ketorolac (TORADOL) 15 MG/ML injection 15 mg (15 mg Intravenous Given 09/15/23 1308)    ED Course/ Medical Decision Making/ A&P       ABCD2 Score: 3                          Medical Decision Making Pt presenting with symptoms concerning for CVA given persistent left face numbness, dizziness and gait abnormality x 3 days., with a ABCD score 3 for age and HTN, unilateral weakness but not present on exam today.  Score suggesting need to consider anticoagulation tx.  MRI is negative for acute CVA findings however.  He appears to have full strength in his extremities but has difficulty with ambulation, stumbled on his left foot with attempted ambulation in his room.  Labs and imaging per below.  Patient will need admission for TIA workup.  Amount and/or  Complexity of Data Reviewed Labs: ordered.    Details: Labs reviewed including c-Met, coags, ethanol level, CBC with differential, all relatively abnormal findings, he does have a creatinine 1.33 which is a stable finding. Radiology: ordered.    Details: MRI brain negative for CVA. ECG/medicine tests: ordered.    Details: Sinus bradycardia, rate 48, nonspecific interventricular conduction delay, unchanged. Discussion of management or test interpretation with external provider(s): Pt discussed with Dr. Darlyn Read.  Will benefit from inpt formal neuro eval,  hospitalist admission, no recs for additional tests now.  Call to hospitalist service,  discussed pt with Dr. Sherryll Burger agrees with admission.   Risk Prescription drug management. Decision regarding hospitalization.           Final Clinical Impression(s) / ED Diagnoses Final diagnoses:  TIA (transient ischemic attack)    Rx / DC Orders ED Discharge Orders     None         Victoriano Lain 09/15/23 1501    Derwood Kaplan, MD 09/16/23 [id number]

## 2023-09-16 ENCOUNTER — Other Ambulatory Visit (HOSPITAL_COMMUNITY): Payer: Self-pay | Admitting: *Deleted

## 2023-09-16 ENCOUNTER — Observation Stay (HOSPITAL_COMMUNITY)

## 2023-09-16 ENCOUNTER — Telehealth: Payer: Self-pay

## 2023-09-16 DIAGNOSIS — G459 Transient cerebral ischemic attack, unspecified: Secondary | ICD-10-CM

## 2023-09-16 DIAGNOSIS — R2689 Other abnormalities of gait and mobility: Secondary | ICD-10-CM | POA: Diagnosis not present

## 2023-09-16 DIAGNOSIS — R29810 Facial weakness: Secondary | ICD-10-CM

## 2023-09-16 DIAGNOSIS — R55 Syncope and collapse: Secondary | ICD-10-CM

## 2023-09-16 LAB — MAGNESIUM: Magnesium: 1.9 mg/dL (ref 1.7–2.4)

## 2023-09-16 LAB — CBC
HCT: 44.7 % (ref 39.0–52.0)
Hemoglobin: 14.6 g/dL (ref 13.0–17.0)
MCH: 32.4 pg (ref 26.0–34.0)
MCHC: 32.7 g/dL (ref 30.0–36.0)
MCV: 99.3 fL (ref 80.0–100.0)
Platelets: 203 10*3/uL (ref 150–400)
RBC: 4.5 MIL/uL (ref 4.22–5.81)
RDW: 12.5 % (ref 11.5–15.5)
WBC: 7.1 10*3/uL (ref 4.0–10.5)
nRBC: 0 % (ref 0.0–0.2)

## 2023-09-16 LAB — ECHOCARDIOGRAM COMPLETE
Area-P 1/2: 2.56 cm2
Height: 74 in
S' Lateral: 3.7 cm
Weight: 3287.5 [oz_av]

## 2023-09-16 LAB — BASIC METABOLIC PANEL
Anion gap: 6 (ref 5–15)
BUN: 21 mg/dL (ref 8–23)
CO2: 24 mmol/L (ref 22–32)
Calcium: 8.7 mg/dL — ABNORMAL LOW (ref 8.9–10.3)
Chloride: 108 mmol/L (ref 98–111)
Creatinine, Ser: 1.38 mg/dL — ABNORMAL HIGH (ref 0.61–1.24)
GFR, Estimated: 53 mL/min — ABNORMAL LOW (ref >60)
Glucose, Bld: 89 mg/dL (ref 70–99)
Potassium: 4.1 mmol/L (ref 3.5–5.1)
Sodium: 138 mmol/L (ref 135–145)

## 2023-09-16 LAB — LIPID PANEL
Cholesterol: 111 mg/dL (ref 0–200)
HDL: 40 mg/dL — ABNORMAL LOW (ref >40)
LDL Cholesterol: 50 mg/dL (ref 0–99)
Total CHOL/HDL Ratio: 2.8 ratio
Triglycerides: 103 mg/dL (ref <150)
VLDL: 21 mg/dL (ref 0–40)

## 2023-09-16 LAB — URINALYSIS, ROUTINE W REFLEX MICROSCOPIC
Bilirubin Urine: NEGATIVE
Glucose, UA: NEGATIVE mg/dL
Hgb urine dipstick: NEGATIVE
Ketones, ur: NEGATIVE mg/dL
Leukocytes,Ua: NEGATIVE
Nitrite: NEGATIVE
Protein, ur: NEGATIVE mg/dL
Specific Gravity, Urine: 1.024 (ref 1.005–1.030)
pH: 5 (ref 5.0–8.0)

## 2023-09-16 LAB — RAPID URINE DRUG SCREEN, HOSP PERFORMED
Amphetamines: NOT DETECTED
Barbiturates: NOT DETECTED
Benzodiazepines: POSITIVE — AB
Cocaine: NOT DETECTED
Opiates: NOT DETECTED
Tetrahydrocannabinol: NOT DETECTED

## 2023-09-16 MED ORDER — PERFLUTREN LIPID MICROSPHERE
1.0000 mL | INTRAVENOUS | Status: DC | PRN
  Administered 2023-09-16: 4 mL via INTRAVENOUS

## 2023-09-16 MED ORDER — ASPIRIN 81 MG PO TBEC: 81.0000 mg | DELAYED_RELEASE_TABLET | Freq: Every day | ORAL | Status: AC

## 2023-09-16 MED ORDER — CLOPIDOGREL BISULFATE 75 MG PO TABS: 75.0000 mg | ORAL_TABLET | Freq: Every day | ORAL | 2 refills | Status: DC

## 2023-09-16 NOTE — Telephone Encounter (Signed)
 30 day cardiac monitor for TIA per Dr. Jenene Slicker and Dr. Jarvis Newcomer (hospitalist)   Monitor mailed to pt's house.

## 2023-09-16 NOTE — Evaluation (Signed)
 Physical Therapy Evaluation Patient Details Name: Andrew Bates MRN: [id number] DOB: 10-28-1945 Today's Date: 09/16/2023  History of Present Illness  Andrew Bates is a 78 y.o. male with medical history significant for hypertension, dyslipidemia, CAD, CKD stage IIIa, GERD, peripheral neuropathy, and Mnire's disease of left ear with history of vertigo who presented to the ED with worsening symptoms of transient visual loss as well as persistent left facial numbness and persistent abnormal gait.  Apparently, emergently symptoms started about 3 days ago on Saturday when he had vision loss with complete blackout that lasted about 5 minutes.  This caused him to fall and strike his left scalp on a door manage and now he has pain to this area.  He also states that he had some drifting to the right when he was walking.  He required help by his wife and had some nausea and took some Zofran and diazepam with minimal improvement.  He saw his ENT yesterday who stated that he likely had a TIA versus possible CVA and told him to go to the ED for further evaluation since A[facility]y testing did not seem to confirm his usual vertigo/Mnire's symptoms.  He has had some persistent numbness to his left face and felt less coordinated in his legs.  He denies any other focal deficits, but does state that his vision has been more blurry.   Clinical Impression  Patient functioning near baseline for functional mobility and gait other than c/o mild frontal headache.  Patient demonstrates good return for bed mobility, transfers and ambulating in room/hallways without loss of balance or c/o dizziness, vertigo.  Plan:  Patient discharged from physical therapy to care of nursing for ambulation daily as tolerated for length of stay.          If plan is discharge home, recommend the following: Help with stairs or ramp for entrance   Can travel by private vehicle        Equipment Recommendations None recommended by PT   Recommendations for Other Services       Functional Status Assessment Patient has not had a recent decline in their functional status     Precautions / Restrictions Precautions Precautions: Fall Restrictions Weight Bearing Restrictions Per Provider Order: No      Mobility  Bed Mobility Overal bed mobility: Independent                  Transfers Overall transfer level: Independent                      Ambulation/Gait Ambulation/Gait assistance: Modified independent (Device/Increase time) Gait Distance (Feet): 150 Feet Assistive device: None Gait Pattern/deviations: WFL(Within Functional Limits) Gait velocity: near normal     General Gait Details: grossly WFL with good return for ambulating in room, hallways without loss of  balance or c/o dizziness  Stairs            Wheelchair Mobility     Tilt Bed    Modified Rankin (Stroke Patients Only)       Balance Overall balance assessment: No apparent balance deficits (not formally assessed)                                           Pertinent Vitals/Pain Pain Assessment Pain Assessment: 0-10 Pain Score: 2  Pain Location: frontal headache Pain Descriptors / Indicators: Headache Pain Intervention(s):  Limited activity within patient's tolerance, Monitored during session    Home Living Family/patient expects to be discharged to:: Private residence Living Arrangements: Spouse/significant other Available Help at Discharge: Family;Available 24 hours/day Type of Home: House Home Access: Stairs to enter   [employer] of Steps: 3   Home Layout: One level Home Equipment: Agricultural consultant (2 wheels);Cane - single point;Grab bars - tub/shower      Prior Function Prior Level of Function : Independent/Modified Independent;Driving             Mobility Comments: Community ambulation without AD, drives ADLs Comments: Independent     Extremity/Trunk Assessment    Upper Extremity Assessment Upper Extremity Assessment: Overall WFL for tasks assessed    Lower Extremity Assessment Lower Extremity Assessment: Overall WFL for tasks assessed    Cervical / Trunk Assessment Cervical / Trunk Assessment: Normal  Communication   Communication Communication: No apparent difficulties    Cognition Arousal: Alert Behavior During Therapy: WFL for tasks assessed/performed   PT - Cognitive impairments: No apparent impairments                         Following commands: Intact       Cueing Cueing Techniques: Verbal cues     General Comments      Exercises     Assessment/Plan    PT Assessment Patient does not need any further PT services  PT Problem List         PT Treatment Interventions      PT Goals (Current goals can be found in the Care Plan section)  Acute Rehab PT Goals Patient Stated Goal: return home with family to assist PT Goal Formulation: With patient Time For Goal Achievement: 09/16/23 Potential to Achieve Goals: Good    Frequency       Co-evaluation               AM-PAC PT "6 Clicks" Mobility  Outcome Measure Help needed turning from your back to your side while in a flat bed without using bedrails?: None Help needed moving from lying on your back to sitting on the side of a flat bed without using bedrails?: None Help needed moving to and from a bed to a chair (including a wheelchair)?: None Help needed standing up from a chair using your arms (e.g., wheelchair or bedside chair)?: None Help needed to walk in hospital room?: None Help needed climbing 3-5 steps with a railing? : None 6 Click Score: 24    End of Session   Activity Tolerance: Patient tolerated treatment well Patient left: in chair;with call bell/phone within reach Nurse Communication: Mobility status PT Visit Diagnosis: Unsteadiness on feet (R26.81);Other abnormalities of gait and mobility (R26.89);Muscle weakness (generalized)  (M62.81)    Time: 9563-8756 PT Time Calculation (min) (ACUTE ONLY): 23 min   Charges:   PT Evaluation $PT Eval Moderate Complexity: 1 Mod PT Treatments $Therapeutic Activity: 23-37 mins PT General Charges $$ ACUTE PT VISIT: 1 Visit         9:04 AM, 09/16/23 Ocie Bob, MPT Physical Therapist with [facility] [phone] office [phone] mobile phone

## 2023-09-16 NOTE — Consult Note (Addendum)
 I connected with  Andrew Bates on 09/16/23 by a video enabled telemedicine application and verified that I am speaking with the correct person using two identifiers.   I discussed the limitations of evaluation and management by telemedicine. The patient expressed understanding and agreed to proceed.  Location of patient: [facility] Location physician: [facility]  Neurology Consultation Reason for Consult: Stroke Referring Physician: Dr. Burgess Amor  CC: Visual changes, syncope, dizziness  History is obtained from: Patient, wife, chart review  HPI: Andrew Bates is a 78 y.o. male with past medical history of hypertension, hyperlipidemia, coronary artery disease, CKD stage III, Mnire's disease of left ear with history of vertigo who presented with transient visual loss, left facial numbness as well as dizziness.  Reports he woke up on 09/12/2023 at around 7 AM and was at baseline.  He was walking to the sunroom holding his dog and his hand and while opening the door suddenly he noticed a "bubble move across left eye towards the right side" and suddenly lost vision in both eyes.  States he could not see anything for about 3-5 minutes followed by complete resolution of symptoms.  He then noticed headache more so on the left side as well as left facial numbness.  He also noticed he was leaning towards the left side.  States the facial numbness and gait instability lasted for about 2 days but has since resolved.  However states mild headache has persisted.  He went to see his ENT physician and was told that this is most likely central etiology and told to come to emergency room.  Last known normal: 09/12/2023 at 7am Event happened at home No tPA as altered mental No thrombectomy as outside window mRS 0  ROS: All other systems reviewed and negative except as noted in the HPI.   Past Medical History:  Diagnosis Date   Adenomatous colon polyp    Anxiety    Arthritis    Asthma     Coronary artery disease    Nonobstructive at cardiac catheterization 2020   Depression    DVT (deep venous thrombosis) (HCC) 2008   Essential hypertension    GERD (gastroesophageal reflux disease)    History of kidney stones    Hyperlipidemia    Internal hemorrhoids    Peripheral neuropathy    PONV (postoperative nausea and vomiting)    PTSD (post-traumatic stress disorder)    Sleep apnea    cpap   Vertigo      Family History  Problem Relation Age of Onset   Hypertension Father    Hyperlipidemia Father    Lung cancer Father        from piece of abestos lodged in his lung   Thyroid cancer Mother    Colon cancer Brother 75   Colon polyps Brother    Bladder Cancer Brother    Esophageal cancer Neg Hx    Stomach cancer Neg Hx    Rectal cancer Neg Hx      Social History:  reports that he quit smoking about 31 years ago. His smoking use included cigars. He has never used smokeless tobacco. He reports that he does not drink alcohol and does not use drugs.   Medications Prior to Admission  Medication Sig Dispense Refill Last Dose/Taking   acetaminophen (TYLENOL) 500 MG tablet Take 500 mg by mouth every 6 (six) hours as needed for fever.   09/14/2023   Arginine 500 MG CAPS Take by mouth.  09/15/2023 Morning   Artificial Tear Ointment (DRY EYES OP) Apply 1 application to eye as needed (dry eyes).    Taking As Needed   aspirin EC 81 MG EC tablet Take 1 tablet (81 mg total) by mouth daily.   09/15/2023 Morning   Biotin 5 MG CAPS Take 10 mg by mouth daily.    09/14/2023 Morning   diazepam (VALIUM) 10 MG tablet Take 1/2 tablet by mouth 3 times daily as needed for dizziness. (Patient taking differently: Take 10 mg by mouth as needed for anxiety (vertigo). For dizziness) 90 tablet 3 09/15/2023 Morning   isosorbide mononitrate (IMDUR) 30 MG 24 hr tablet Take 1 tablet (30 mg total) by mouth in the morning and at bedtime. 180 tablet 3 09/15/2023 Morning   losartan (COZAAR) 50 MG tablet TAKE  1 TABLET BY MOUTH TWICE A DAILY. 180 tablet 2 09/15/2023 Morning   nitroGLYCERIN (NITROSTAT) 0.4 MG SL tablet DISSOLVE 1 TABLET SUBLINGUALLY AS NEEDED FOR CHEST PAIN, MAY REPEAT EVERY 5 MINUTES. AFTER 3 CALL 911. 25 tablet 3 Past Week   omeprazole (PRILOSEC) 40 MG capsule TAKE ONE CAPSULE BY MOUTH ONCE A DAY FOR HEARTBURN (TAKE ON AN EMPTY STOMACH 30 MINUTES PRIOR TO A MEAL)   09/15/2023 Morning   ondansetron (ZOFRAN ODT) 4 MG disintegrating tablet Take 1 tablet (4 mg total) by mouth every 8 (eight) hours as needed for nausea or vomiting. 20 tablet 0 09/14/2023 Evening   polyethylene glycol (MIRALAX / GLYCOLAX) 17 g packet Take 17 g by mouth as needed for moderate constipation.   Taking As Needed   rosuvastatin (CRESTOR) 20 MG tablet TAKE 1 TABLET BY MOUTH AT 6PM. 90 tablet 3 09/15/2023 Morning   Wheat Dextrin (BENEFIBER DRINK MIX PO) Take by mouth as needed (constipation).   Taking As Needed      Exam: Current vital signs: BP 120/68 (BP Location: Left Arm)   Pulse 84   Temp 97.8 F (36.6 C)   Resp 17   Ht 6\' 2"  (1.88 m)   Wt 93.2 kg   SpO2 95%   BMI 26.38 kg/m  Vital signs in last 24 hours: Temp:  [97.8 F (36.6 C)-98.4 F (36.9 C)] 97.8 F (36.6 C) (03/26 0440) Pulse Rate:  [46-84] 84 (03/26 0440) Resp:  [17-22] 17 (03/26 0440) BP: (120-167)/(68-92) 120/68 (03/26 0440) SpO2:  [94 %-96 %] 95 % (03/26 0440) Weight:  [93.2 kg] 93.2 kg (03/25 1535)   Physical Exam  Constitutional: Appears well-developed and well-nourished.  Psych: Affect appropriate to situation Neuro: AOx3, no aphasia, cranial nerves grossly intact, antigravity without drift in upper extremities, sensory intact to light touch, FTN intact bilaterally  NIHSS 0  I have reviewed labs in epic and the results pertinent to this consultation are: CBC:  Recent Labs  Lab 09/15/23 1006 09/16/23 0415  WBC 6.5 7.1  NEUTROABS 4.1  --   HGB 15.5 14.6  HCT 46.5 44.7  MCV 100.2* 99.3  PLT 202 203    Basic Metabolic  Panel:  Lab Results  Component Value Date   NA 138 09/16/2023   K 4.1 09/16/2023   CO2 24 09/16/2023   GLUCOSE 89 09/16/2023   BUN 21 09/16/2023   CREATININE 1.38 (H) 09/16/2023   CALCIUM 8.7 (L) 09/16/2023   GFRNONAA 53 (L) 09/16/2023   GFRAA 53 (L) 06/14/2019   Lipid Panel:  Lab Results  Component Value Date   LDLCALC 57 06/22/2021   HgbA1c:  Lab Results  Component Value Date  HGBA1C 5.7 (H) 06/22/2021   Urine Drug Screen:     Component Value Date/Time   LABOPIA NONE DETECTED 09/16/2023 0825   COCAINSCRNUR NONE DETECTED 09/16/2023 0825   LABBENZ POSITIVE (A) 09/16/2023 0825   AMPHETMU NONE DETECTED 09/16/2023 0825   THCU NONE DETECTED 09/16/2023 0825   LABBARB NONE DETECTED 09/16/2023 0825    Alcohol Level     Component Value Date/Time   ETH <10 09/15/2023 1006     I have reviewed the images obtained  MRI brain with and without contrast 09/15/2023: No acute or reversible finding. Low moderate chronic small-vessel ischemic changes of the cerebral hemispheric white matter, similar to the study of November 2020, only minimally progressive.  MRA head without contrast 09/15/2023: No intracranial stenosis  Carotid ultrasound 09/15/2023: olor duplex indicates minimal homogeneous plaque, with no hemodynamically significant stenosis by duplex criteria in the extracranial cerebrovascular circulation.  TTE pending  ASSESSMENT/PLAN: 78 year old male with transient binocular vision loss for about 3 to 5 minutes followed by left facial numbness and gait instability/leaning to left side for about 2 days.  MRI negative stroke -Patient symptoms are concerning for posterior circulation MRI negative stroke -Etiology: Likely embolic  Recommendations: -Recommend aspirin 81 mg daily and Plavix 75 mg daily followed by monotherapy with Plavix 75 mg daily -Lipid panel ordered.  If please start on high intensity statin if LDL more than 70 -TTE pending.  Negative recommend 30-day  cardiac monitor to look for paroxysmal A-fib -Goal blood pressure: Normotension -PT/OT -Stroke education -Follow-up with neurology in 6 to 8 weeks (order placed) -Plan discussed with Dr. Jarvis Newcomer via secure chat and patient and wife at bedside  Thank you for allowing [place] to participate in the care of this patient. If you have any further questions, please contact  me or neurohospitalist.   Lindie Spruce [facility] neuro[facility]ist

## 2023-09-16 NOTE — Progress Notes (Signed)
 Patient ID: Andrew Bates, male   DOB: May 02, 1946, 78 y.o.   MRN: [id number]  Cc: Worsening dizziness, visual loss, recent fall  HPI: The patient is a 78 year old male with a complex medical history, including recurrent dizziness, left ear Mnire's disease, hypertension, hyperlipidemia, coronary artery disease, and chronic kidney disease. The patient has a history of severe left ear Mnire's disease and recurrent BPPV. He was treated with left endolymphatic sac decompression surgery in 2020, with good control of his dizziness for several years.  The patient presents today complaining that his dizziness has worsened over the past 2 days.  His symptoms started when he developed complete loss of vision which lasted about 3-5 minutes, causing him to fall and strike his left scalp on a door edge.  He developed spinning vertigo that lasted for several hours after the visual loss episode.  Currently he complains of persistent numbness of the left face, with difficulty maintaining his balance and gait.  Currently he denies any otalgia, otorrhea, or change in his hearing.  Exam: General: Communicates without difficulty, well nourished, no acute distress. Head: Normocephalic, no evidence injury, no tenderness, facial buttresses intact without stepoff. Face/sinus: No tenderness to palpation and percussion. Facial movement is normal and symmetric. Eyes: PERRL, EOMI. No scleral icterus, conjunctivae clear. Neuro: CN II exam reveals vision grossly intact.  No nystagmus at any point of gaze. Ears: Auricles well formed without lesions.  A foreign body (a wax trap) is noted within the left ear canal.  The foreign body is likely a part of his hearing aid.  His tympanic membranes and middle ear spaces are noted to be normal.  Nose: External evaluation reveals normal support and skin without lesions.  Dorsum is intact.  Anterior rhinoscopy reveals congested mucosa over anterior aspect of inferior turbinates and intact septum.   No purulence noted. Oral:  Oral cavity and oropharynx are intact, symmetric, without erythema or edema.  Mucosa is moist without lesions. Neck: Full range of motion without pain.  There is no significant lymphadenopathy.  No masses palpable.  Thyroid bed within normal limits to palpation.  Parotid glands and submandibular glands equal bilaterally without mass.  Trachea is midline. Neuro:  CN 2-12 grossly intact.  Gait: Wide-based. Vestibular: Dix-Hallpike negative.  No nystagmus was noted with the right-sided positioning.  Vestibular: There is no nystagmus with pneumatic pressure on either tympanic membrane or Valsalva. The cerebellar examination is unremarkable.    Assessment: 1.  Recent sudden visual loss, concerning for TIA versus CVA. 2.  Recurrent dizziness, with possible exacerbation of his Mnire's disease. 3.  His ear canals, tympanic membranes, and middle ear spaces are all normal.  His Dix-Hallpike maneuver is negative today.  Plan: 1.  The physical exam findings are reviewed with the patient and his wife. 2.  The possibility that the patient has a TIA or CVA is extensively discussed with the patient and his wife.  The patient is encouraged to see a neurologist and ophthalmologist as soon as possible.  If his symptoms worsen, he should seek treatment from an emergency room. 3.  Use of Zofran and diazepam as needed. 4.  Continue the 1500 mg low-salt diet. 5.  The pathophysiology of Mnire's disease and dizziness are discussed with the patient and his wife. 6.  The patient is encouraged to call with any questions or concerns.

## 2023-09-16 NOTE — Plan of Care (Signed)
   Problem: Health Behavior/Discharge Planning: Goal: Ability to manage health-related needs will improve Outcome: Progressing

## 2023-09-16 NOTE — Progress Notes (Signed)
*  PRELIMINARY RESULTS* Echocardiogram 2D Echocardiogram has been performed with [facility].  [name] 09/16/2023, 3:07 PM

## 2023-09-16 NOTE — Discharge Summary (Signed)
 Physician Discharge Summary   Patient: Andrew Bates MRN: [id number] DOB: 1946-01-24  Admit date:     09/15/2023  Discharge date: 09/16/23  Discharge Physician: Tyrone Nine   PCP: Assunta Found, MD   Recommendations at discharge:  Follow up with PCP in 1-2 weeks for continued chronic disease management.  Follow up with neurology in 6-8 weeks, referral placed.  Follow up with cardiology given hx CAD and need for 30-day cardiac monitor.   Discharge Diagnoses: Principal Problem:   TIA (transient ischemic attack) Active Problems:   Stage 3b chronic kidney disease (CKD) (HCC)   Essential hypertension   Hyperlipidemia with target LDL less than 70   GERD (gastroesophageal reflux disease)   Meniere's disease of left ear  Hospital Course: Andrew Bates is a 78 y.o. male with medical history significant for hypertension, dyslipidemia, CAD, CKD stage IIIa, GERD, peripheral neuropathy, and Mnire's disease of left ear with history of vertigo who presented to the ED with worsening symptoms of transient visual loss as well as persistent left facial numbness and persistent abnormal gait.  Apparently, emergently symptoms started about 3 days ago on Saturday when he had vision loss with complete blackout that lasted about 5 minutes.  This caused him to fall and strike his left scalp on a door manage and now he has pain to this area.  He also states that he had some drifting to the right when he was walking.  He required help by his wife and had some nausea and took some Zofran and diazepam with minimal improvement.  He saw his ENT yesterday who stated that he likely had a TIA versus possible CVA and told him to go to the ED for further evaluation since Apley testing did not seem to confirm his usual vertigo/Mnire's symptoms.  He has had some persistent numbness to his left face and felt less coordinated in his legs.  He denies any other focal deficits, but does state that his vision has been more  blurry.   ED Course: Vital signs stable, but with some bradycardia noted on EKG.  Creatinine 1.33 and at baseline.  Brain MRI negative for any acute findings.  Assessment and Plan: MRI negative stroke: Patient symptoms are concerning for posterior circulation MRI negative stroke, likely embolic. No afib on monitor during admission. No CES on echo.  - Continue aspirin 81mg  (PTA medication) and add plavix 75mg  daily. Continue DAPT x3 weeks then switch to plavix alone.  - LDL 50, continue rosuvastatin 20mg  daily - Cardiology will arrange 30-day cardiac monitor to look for paroxysmal A-fib  -Follow-up with neurology in 6 to 8 weeks (order placed)  Sinus bradycardia: Does not appear to be on AV nodal blockade agents. 2D echocardiogram essentially normal.    CKD 3a: Creatinine 1.3 and appears to be at baseline    History of CAD - Continue antiplatelet and statin. Needs cardiology f/u. Not on BB presumably due to bradycardia at baseline.   Hypertension: No changes. Continue losartan, imdur.    Dyslipidemia -Continue statin  Consultants: Neurology, Dr. Melynda Ripple Procedures performed: Echo  Disposition: Home Diet recommendation:  Cardiac diet DISCHARGE MEDICATION: Allergies as of 09/16/2023       Reactions   Other Nausea And Vomiting, Other (See Comments)   "STRONG" PAIN MEDICATIONS IN GENERAL: HEADACHE   Tamsulosin Anaphylaxis   Codeine Nausea And Vomiting   REACTION: Headache   Morphine And Codeine Nausea And Vomiting   Metoprolol Itching   Oxybutynin Swelling   Pentazocine-naloxone  Hcl Nausea And Vomiting   Sertraline Swelling   Swelling in knee 100 mg   Topiramate Other (See Comments)   Headache        Medication List     TAKE these medications    acetaminophen 500 MG tablet Commonly known as: TYLENOL Take 500 mg by mouth every 6 (six) hours as needed for fever.   Arginine 500 MG Caps Take by mouth.   aspirin EC 81 MG tablet Take 1 tablet (81 mg total) by mouth  daily for 21 days.   BENEFIBER DRINK MIX PO Take by mouth as needed (constipation).   Biotin 5 MG Caps Take 10 mg by mouth daily.   clopidogrel 75 MG tablet Commonly known as: PLAVIX Take 1 tablet (75 mg total) by mouth daily. Start taking on: September 17, 2023   diazepam 10 MG tablet Commonly known as: VALIUM Take 1/2 tablet by mouth 3 times daily as needed for dizziness. What changed:  how much to take how to take this when to take this reasons to take this additional instructions   DRY EYES OP Apply 1 application to eye as needed (dry eyes).   isosorbide mononitrate 30 MG 24 hr tablet Commonly known as: IMDUR Take 1 tablet (30 mg total) by mouth in the morning and at bedtime.   losartan 50 MG tablet Commonly known as: COZAAR TAKE 1 TABLET BY MOUTH TWICE A DAILY.   nitroGLYCERIN 0.4 MG SL tablet Commonly known as: NITROSTAT DISSOLVE 1 TABLET SUBLINGUALLY AS NEEDED FOR CHEST PAIN, MAY REPEAT EVERY 5 MINUTES. AFTER 3 CALL 911.   omeprazole 40 MG capsule Commonly known as: PRILOSEC TAKE ONE CAPSULE BY MOUTH ONCE A DAY FOR HEARTBURN (TAKE ON AN EMPTY STOMACH 30 MINUTES PRIOR TO A MEAL)   ondansetron 4 MG disintegrating tablet Commonly known as: Zofran ODT Take 1 tablet (4 mg total) by mouth every 8 (eight) hours as needed for nausea or vomiting.   polyethylene glycol 17 g packet Commonly known as: MIRALAX / GLYCOLAX Take 17 g by mouth as needed for moderate constipation.   rosuvastatin 20 MG tablet Commonly known as: CRESTOR TAKE 1 TABLET BY MOUTH AT 6PM.        Follow-up Information     Assunta Found, MD Follow up.   Specialty: Family Medicine Contact information: [address] [place] [address] [phone]         Micki Riley, MD Follow up in 1 month(s).   Specialties: Neurology, Radiology Contact information: [address] [place] [address] [phone]                Discharge Exam: Ceasar Mons Weights    09/15/23 0853 09/15/23 1535  Weight: 95 kg 93.2 kg  BP (!) 150/80 (BP Location: Left Arm)   Pulse (!) 57   Temp 97.7 F (36.5 C) (Oral)   Resp 17   Ht 6\' 2"  (1.88 m)   Wt 93.2 kg   SpO2 95%   BMI 26.38 kg/m   No distress Well-appearing male Clear, nonlabored RRR, rate in 60's, no MRG or edema Soft, NT, ND Alert, oriented, normal speech.   Condition at discharge: stable  The results of significant diagnostics from this hospitalization (including imaging, microbiology, ancillary and laboratory) are listed below for reference.   Imaging Studies: ECHOCARDIOGRAM COMPLETE Result Date: 09/16/2023    ECHOCARDIOGRAM REPORT   Patient Name:   VALERIO PINARD Date of Exam: 09/16/2023 Medical Rec #:  [id number]  Height:       74.0 in Accession #:    [id number]     Weight:       205.5 lb Date of Birth:  [birth year]      BSA:          2.199 m Patient Age:    77 years       BP:           146/84 mmHg Patient Gender: M              HR:           84 bpm. [facility] Procedure: 2D Echo, Cardiac Doppler and Color Doppler (Both Spectral and Color            Flow Doppler were utilized during procedure). Indications:    Syncope R55  History:        Patient has prior history of Echocardiogram examinations, most                 recent 06/22/2021. CAD, TIA; Risk Factors:Dyslipidemia,                 Hypertension and Sleep Apnea.  Sonographer:    Celesta Gentile RCS Referring Phys: [id number] PRATIK D [facility] IMPRESSIONS  1. Left ventricular ejection fraction, by estimation, is 60 to 65%. The left ventricle has normal function. The left ventricle has no regional wall motion abnormalities. Left ventricular diastolic parameters are consistent with Grade I diastolic dysfunction (impaired relaxation).  2. Right ventricular systolic function is normal. The right ventricular size is normal. There is normal pulmonary artery systolic pressure.  3. The mitral valve is grossly normal. No evidence of mitral valve  regurgitation.  4. The aortic valve is tricuspid. Aortic valve regurgitation is not visualized. No aortic stenosis is present.  5. The inferior vena cava is normal in size with greater than 50% respiratory variability, suggesting right atrial pressure of 3 mmHg. Comparison(s): Prior images reviewed side by side. LVEF normal range at 60-65%. FINDINGS  Left Ventricle: Left ventricular ejection fraction, by estimation, is 60 to 65%. The left ventricle has normal function. The left ventricle has no regional wall motion abnormalities. Definity contrast agent was given IV to delineate the left ventricular  endocardial borders. The left ventricular internal cavity size was normal in size. There is no left ventricular hypertrophy. Left ventricular diastolic parameters are consistent with Grade I diastolic dysfunction (impaired relaxation). Right Ventricle: The right ventricular size is normal. No increase in right ventricular wall thickness. Right ventricular systolic function is normal. There is normal pulmonary artery systolic pressure. The tricuspid regurgitant velocity is 2.39 m/s, and  with an assumed right atrial pressure of 3 mmHg, the estimated right ventricular systolic pressure is 25.8 mmHg. Left Atrium: Left atrial size was normal in size. Right Atrium: Right atrial size was normal in size. Pericardium: There is no evidence of pericardial effusion. Mitral Valve: The mitral valve is grossly normal. No evidence of mitral valve regurgitation. Tricuspid Valve: The tricuspid valve is grossly normal. Tricuspid valve regurgitation is mild. Aortic Valve: The aortic valve is tricuspid. There is mild aortic valve annular calcification. Aortic valve regurgitation is not visualized. No aortic stenosis is present. Pulmonic Valve: The pulmonic valve was grossly normal. Pulmonic valve regurgitation is trivial. Aorta: The aortic root is normal in size and structure. Venous: The inferior vena cava is normal in size with greater  than 50% respiratory variability, suggesting right atrial pressure of 3 mmHg. IAS/Shunts:  No atrial level shunt detected by color flow Doppler. Additional Comments: 3D was performed not requiring image post processing on an independent workstation and was indeterminate.  LEFT VENTRICLE PLAX 2D LVIDd:         5.20 cm   Diastology LVIDs:         3.70 cm   LV e' medial:    5.44 cm/s LV PW:         1.10 cm   LV E/e' medial:  11.5 LV IVS:        1.00 cm   LV e' lateral:   7.83 cm/s LVOT diam:     2.10 cm   LV E/e' lateral: 8.0 LV SV:         66 LV SV Index:   30 LVOT Area:     3.46 cm  RIGHT VENTRICLE RV S prime:     18.30 cm/s TAPSE (M-mode): 2.3 cm LEFT ATRIUM             Index        RIGHT ATRIUM           Index LA diam:        3.40 cm 1.55 cm/m   RA Area:     15.60 cm LA Vol (A2C):   68.3 ml 31.07 ml/m  RA Volume:   48.90 ml  22.24 ml/m LA Vol (A4C):   62.1 ml 28.25 ml/m LA Biplane Vol: 67.9 ml 30.88 ml/m  AORTIC VALVE LVOT Vmax:   81.60 cm/s LVOT Vmean:  58.900 cm/s LVOT VTI:    0.190 m  AORTA Ao Root diam: 3.60 cm MITRAL VALVE               TRICUSPID VALVE MV Area (PHT): 2.56 cm    TR Peak grad:   22.8 mmHg MV Decel Time: 296 msec    TR Vmax:        239.00 cm/s MV E velocity: 62.70 cm/s MV A velocity: 66.10 cm/s  SHUNTS MV E/A ratio:  0.95        Systemic VTI:  0.19 m                            Systemic Diam: 2.10 cm Nona Dell MD Electronically signed by Nona Dell MD Signature Date/Time: 09/16/2023/3:13:12 PM    Final    MR ANGIO HEAD WO CONTRAST Result Date: 09/15/2023 CLINICAL DATA:  Neuro deficit, acute, stroke suspected. EXAM: MRA HEAD WITHOUT CONTRAST TECHNIQUE: Angiographic images of the Circle of Willis were acquired using MRA technique without intravenous contrast. COMPARISON:  MRI same day FINDINGS: Both internal carotid arteries are widely patent into the brain. No siphon stenosis. The anterior and middle cerebral vessels are patent without proximal stenosis, aneurysm or vascular  malformation. Both vertebral arteries are widely patent to the basilar. No basilar stenosis. Posterior circulation branch vessels appear normal. IMPRESSION: Normal intracranial MR angiography of the large and medium size vessels. Electronically Signed   By: Paulina Fusi M.D.   On: 09/15/2023 19:04   US Carotid Bilateral Result Date: 09/15/2023 CLINICAL DATA:  78 year old male with syncope EXAM: BILATERAL CAROTID DUPLEX ULTRASOUND TECHNIQUE: Wallace Cullens scale imaging, color Doppler and duplex ultrasound were performed of bilateral carotid and vertebral arteries in the neck. COMPARISON:  None Available. FINDINGS: Criteria: Quantification of carotid stenosis is based on velocity parameters that correlate the residual internal carotid diameter with NASCET-based stenosis levels, using the diameter of the  distal internal carotid lumen as the denominator for stenosis measurement. The following velocity measurements were obtained: RIGHT ICA:  Systolic 51 cm/sec, Diastolic 14 cm/sec CCA:  52 cm/sec SYSTOLIC ICA/CCA RATIO:  1.0 ECA:  69 cm/sec LEFT ICA:  Systolic 57 cm/sec, Diastolic 18 cm/sec CCA:  74 cm/sec SYSTOLIC ICA/CCA RATIO:  0.8 ECA:  64 cm/sec Right Brachial SBP: Not acquired Left Brachial SBP: Not acquired RIGHT CAROTID ARTERY: No significant calcified disease of the right common carotid artery. Intermediate waveform maintained. Homogeneous plaque without significant calcifications at the right carotid bifurcation. Low resistance waveform of the right ICA. No significant tortuosity. RIGHT VERTEBRAL ARTERY: Antegrade flow with low resistance waveform. LEFT CAROTID ARTERY: No significant calcified disease of the left common carotid artery. Intermediate waveform maintained. Homogeneous plaque at the left carotid bifurcation without significant calcifications. Low resistance waveform of the left ICA. LEFT VERTEBRAL ARTERY:  Antegrade flow with low resistance waveform. IMPRESSION: Color duplex indicates minimal homogeneous  plaque, with no hemodynamically significant stenosis by duplex criteria in the extracranial cerebrovascular circulation. Signed, Yvone Neu. Miachel Roux, RPVI [facility] Electronically Signed   By: Gilmer Mor D.O.   On: 09/15/2023 16:35   DG Hand Complete Right Result Date: 09/15/2023 CLINICAL DATA:  pain and swelling since fall. EXAM: RIGHT HAND - COMPLETE 3+ VIEW COMPARISON:  None Available. FINDINGS: No acute fracture or dislocation. No aggressive osseous lesion. There are mild diffuse degenerative changes of the imaged joints with asymmetric moderate involvement of distal interphalangeal joints and severe involvement of first carpometacarpal joint. No radiopaque foreign bodies. Soft tissues are within normal limits. IMPRESSION: No acute osseous abnormality of the right hand. Electronically Signed   By: Jules Schick M.D.   On: 09/15/2023 12:28   MR Brain W and Wo Contrast Result Date: 09/15/2023 CLINICAL DATA:  Neuro deficit, acute, stroke suspected EXAM: MRI HEAD WITHOUT AND WITH CONTRAST TECHNIQUE: Multiplanar, multiecho pulse sequences of the brain and surrounding structures were obtained without and with intravenous contrast. CONTRAST:  10mL GADAVIST GADOBUTROL 1 MMOL/ML IV SOLN COMPARISON:  Head CT 03/14/2023.  MRI 04/28/2019. FINDINGS: Brain: Diffusion imaging does not show any acute or subacute infarction or other cause of restricted diffusion. Minimal chronic small-vessel change affects pons. No focal cerebellar finding. Cerebral hemispheres show low moderate chronic small-vessel ischemic changes of the white matter, similar to the study of November 2020, only minimally progressive. No cortical or large vessel territory stroke. No mass, hemorrhage, hydrocephalus or extra-axial collection. After contrast administration, no abnormal enhancement occurs. Vascular: Major vessels at the base of the brain show flow. Skull and upper cervical  spine: Negative Sinuses/Orbits: Clear/normal Other: None IMPRESSION: No acute or reversible finding. Low moderate chronic small-vessel ischemic changes of the cerebral hemispheric white matter, similar to the study of November 2020, only minimally progressive. Electronically Signed   By: Paulina Fusi M.D.   On: 09/15/2023 11:00    Microbiology: Results for orders placed or performed during the hospital encounter of 06/21/21  Resp Panel by RT-PCR (Flu A&B, Covid) Nasopharyngeal Swab     Status: None   Collection Time: 06/22/21  1:48 AM   Specimen: Nasopharyngeal Swab; Nasopharyngeal(NP) swabs in vial transport medium  Result Value Ref Range Status   SARS Coronavirus 2 by RT PCR NEGATIVE NEGATIVE Final    Comment: (NOTE) SARS-CoV-2 target nucleic acids are NOT DETECTED.  The SARS-CoV-2 RNA is generally detectable in upper respiratory specimens during the acute phase of infection. The lowest concentration  of SARS-CoV-2 viral copies this assay can detect is 138 copies/mL. A negative result does not preclude SARS-Cov-2 infection and should not be used as the sole basis for treatment or other patient management decisions. A negative result may occur with  improper specimen collection/handling, submission of specimen other than nasopharyngeal swab, presence of viral mutation(s) within the areas targeted by this assay, and inadequate number of viral copies(<138 copies/mL). A negative result must be combined with clinical observations, patient history, and epidemiological information. The expected result is Negative.  Fact Sheet for Patients:  BloggerCourse.com  Fact Sheet for Healthcare Providers:  SeriousBroker.it  This test is no t yet approved or cleared by the [place] FDA and  has been authorized for detection and/or diagnosis of SARS-CoV-2 by FDA under an Emergency Use Authorization (EUA). This EUA will remain  in effect (meaning  this test can be used) for the duration of the COVID-19 declaration under Section 564(b)(1) of the Act, 21 U.S.C.section 360bbb-3(b)(1), unless the authorization is terminated  or revoked sooner.       Influenza A by PCR NEGATIVE NEGATIVE Final   Influenza B by PCR NEGATIVE NEGATIVE Final    Comment: (NOTE) The Xpert Xpress SARS-CoV-2/FLU/RSV plus assay is intended as an aid in the diagnosis of influenza from Nasopharyngeal swab specimens and should not be used as a sole basis for treatment. Nasal washings and aspirates are unacceptable for Xpert Xpress SARS-CoV-2/FLU/RSV testing.  Fact Sheet for Patients: BloggerCourse.com  Fact Sheet for Healthcare Providers: SeriousBroker.it  This test is not yet approved or cleared by the [place] FDA and has been authorized for detection and/or diagnosis of SARS-CoV-2 by FDA under an Emergency Use Authorization (EUA). This EUA will remain in effect (meaning this test can be used) for the duration of the COVID-19 declaration under Section 564(b)(1) of the Act, 21 U.S.C. section 360bbb-3(b)(1), unless the authorization is terminated or revoked.  Performed at [facility], [address] [place], [place] [address]   MRSA Next Gen by PCR, Nasal     Status: None   Collection Time: 06/22/21  3:18 AM   Specimen: Nasal Mucosa; Nasal Swab  Result Value Ref Range Status   MRSA by PCR Next Gen NOT DETECTED NOT DETECTED Final    Comment: (NOTE) The GeneXpert MRSA Assay (FDA approved for NASAL specimens only), is one component of a comprehensive MRSA colonization surveillance program. It is not intended to diagnose MRSA infection nor to guide or monitor treatment for MRSA infections. Test performance is not FDA approved in patients less than 66 years old. Performed at [facility], [address], [place], [place] [address]     Labs: CBC: Recent Labs  Lab 09/15/23 1006  09/16/23 0415  WBC 6.5 7.1  NEUTROABS 4.1  --   HGB 15.5 14.6  HCT 46.5 44.7  MCV 100.2* 99.3  PLT 202 203   Basic Metabolic Panel: Recent Labs  Lab 09/15/23 1006 09/16/23 0415  NA 139 138  K 4.2 4.1  CL 105 108  CO2 24 24  GLUCOSE 98 89  BUN 20 21  CREATININE 1.33* 1.38*  CALCIUM 9.0 8.7*  MG  --  1.9   Liver Function Tests: Recent Labs  Lab 09/15/23 1006  AST 18  ALT 15  ALKPHOS 70  BILITOT 0.5  PROT 6.5  ALBUMIN 4.0   CBG: No results for input(s): "GLUCAP" in the last 168 hours.  Discharge time spent: greater than 30 minutes.  Signed:  Tyrone Nine, MD [facility] 09/16/2023

## 2023-09-23 ENCOUNTER — Other Ambulatory Visit: Payer: Self-pay | Admitting: Cardiology

## 2023-09-23 ENCOUNTER — Telehealth: Payer: Self-pay | Admitting: Cardiology

## 2023-09-23 DIAGNOSIS — R7309 Other abnormal glucose: Secondary | ICD-10-CM | POA: Diagnosis not present

## 2023-09-23 DIAGNOSIS — E785 Hyperlipidemia, unspecified: Secondary | ICD-10-CM | POA: Diagnosis not present

## 2023-09-23 DIAGNOSIS — I2510 Atherosclerotic heart disease of native coronary artery without angina pectoris: Secondary | ICD-10-CM | POA: Diagnosis not present

## 2023-09-23 DIAGNOSIS — G459 Transient cerebral ischemic attack, unspecified: Secondary | ICD-10-CM | POA: Diagnosis not present

## 2023-09-23 NOTE — Telephone Encounter (Signed)
 Pt notified that monitor is out for delivery per [employer] website. Pt had no further questions or concerns at this time.

## 2023-09-23 NOTE — Telephone Encounter (Signed)
 Patient called to follow-up on who is ordering his heart monitor - the cardiologist or the [place].  Patient wants to know if he will need to see Dr. Diona Browner sooner than 5/9.

## 2023-09-25 ENCOUNTER — Ambulatory Visit: Attending: Internal Medicine

## 2023-09-25 DIAGNOSIS — G459 Transient cerebral ischemic attack, unspecified: Secondary | ICD-10-CM

## 2023-10-02 ENCOUNTER — Telehealth: Payer: Self-pay | Admitting: Cardiology

## 2023-10-02 ENCOUNTER — Telehealth (INDEPENDENT_AMBULATORY_CARE_PROVIDER_SITE_OTHER): Payer: Self-pay | Admitting: Otolaryngology

## 2023-10-02 NOTE — Telephone Encounter (Signed)
 Patient called and stated that his dizziness is getting worse since he started his Plavix. He is stating that his dizziness ilast from 5 minutes to 30 minutes. Per dr.Teoh we can make a referral to [facility] , [facility] or [facility]. He has and upcoming appointment with [facility] on 12/14/23. He has been seen at [facility] in the past, worked up for dizziness. He will call and let us know what he has decided .

## 2023-10-02 NOTE — Telephone Encounter (Signed)
 Pt c/o medication issue:  1. Name of Medication:  Plavix 75 MG  2. How are you currently taking this medication (dosage and times per day)?   3. Are you having a reaction (difficulty breathing--STAT)?   4. What is your medication issue?   Patient plans to stop Plavix because he has been having headaches, dizziness, nausea, and occasional blurred vision. He says these are all side effects caused by Plavix and he plans to switch back to Aspirin. Patient mentions that his BP is still within normal parameters.

## 2023-10-02 NOTE — Telephone Encounter (Signed)
 Patient notified and verbalized understanding. Patient will cal Neuro

## 2023-10-20 ENCOUNTER — Encounter: Payer: Self-pay | Admitting: Neurology

## 2023-10-20 ENCOUNTER — Ambulatory Visit (INDEPENDENT_AMBULATORY_CARE_PROVIDER_SITE_OTHER): Admitting: Neurology

## 2023-10-20 VITALS — Ht 74.0 in | Wt 204.0 lb

## 2023-10-20 DIAGNOSIS — R42 Dizziness and giddiness: Secondary | ICD-10-CM | POA: Diagnosis not present

## 2023-10-20 DIAGNOSIS — H8102 Meniere's disease, left ear: Secondary | ICD-10-CM

## 2023-10-20 DIAGNOSIS — G459 Transient cerebral ischemic attack, unspecified: Secondary | ICD-10-CM

## 2023-10-20 MED ORDER — DIAZEPAM 10 MG PO TABS: ORAL_TABLET | ORAL | 0 refills | Status: AC

## 2023-10-20 NOTE — Progress Notes (Unsigned)
 [facility]  PATIENT: Andrew Bates DOB: 09-01-1945  REQUESTING CLINICIAN: Arleene Lack, MD HISTORY FROM: Patient/Spouse and Chart review  REASON FOR VISIT: MRI Negative stroke    HISTORICAL  CHIEF COMPLAINT:  Chief Complaint  Patient presents with   New Patient (Initial Visit)    Pt in 12, here with wife Andrew Bates Pt is referred for recent CVA. Pt states he continues feeling off balance, states he feels lightheaded on and off. Pt has questions about his diazepam  10 mg prescription.     HISTORY OF PRESENT ILLNESS:  This is a 78 year old gentleman past medical history of Mnire disease, hypertension, hyperlipidemia, CKD, CAD who is presenting after being admitted to the hospital for episode of sudden vision loss, vertigo, and diagnosed with MRI negative stroke.  Upon discharge, patient was recommended DAPT for 3 weeks and then aspirin  alone, he has completed the regimen and currently on aspirin  alone.  He also takes Crestor . In brief, he tells me the day of presentation he had sudden onset of vision loss while trying to open the door to let his dog out, following that he had severe vertigo feeling like his head was pulling to the right, he fell down on his buttock, he could not see anything but can hear his dog barking.  He called his wife for help.  Patient reports after standing up, he was able to get his eyesight back.  It was initially blurry but improved over time.  He did not immediately go to the ED but the following day, he contacted his doctors who all recommended ED visit to rule out stroke.  His work up was negative for acute stroke but due to his symptoms, he was diagnosed with MRI negative stroke vs. TIA. Since then he has been having daily episode of dizziness, these are worse with movement, when laying down, when looking up or looking down and when going down to bed.  He did follow-up with with his ENT provider twice, who did the Epley maneuver and told  him that he could not appreciate any abnormalities with his otoliths.  He does use use Valium  as needed for the dizziness, and also meclizine .    Hospital Summary and Course  Andrew Bates is a 78 y.o. male with medical history significant for hypertension, dyslipidemia, CAD, CKD stage IIIa, GERD, peripheral neuropathy, and Mnire's disease of left ear with history of vertigo who presented to the ED with worsening symptoms of transient visual loss as well as persistent left facial numbness and persistent abnormal gait.  Apparently, emergently symptoms started about 3 days ago on Saturday when he had vision loss with complete blackout that lasted about 5 minutes.  This caused him to fall and strike his left scalp on a door manage and now he has pain to this area.  He also states that he had some drifting to the right when he was walking.  He required help by his wife and had some nausea and took some Zofran  and diazepam  with minimal improvement.  He saw his ENT yesterday who stated that he likely had a TIA versus possible CVA and told him to go to the ED for further evaluation since Apley testing did not seem to confirm his usual vertigo/Mnire's symptoms.  He has had some persistent numbness to his left face and felt less coordinated in his legs.  He denies any other focal deficits, but does state that his vision has been more blurry.   ED  Course: Vital signs stable, but with some bradycardia noted on EKG.  Creatinine 1.33 and at baseline.  Brain MRI negative for any acute findings.   Assessment and Plan: MRI negative stroke: Patient symptoms are concerning for posterior circulation MRI negative stroke, likely embolic. No afib on monitor during admission. No CES on echo.  - Continue aspirin  81mg  (PTA medication) and add plavix  75mg  daily. Continue DAPT x3 weeks then switch to plavix  alone.  - LDL 50, continue rosuvastatin  20mg  daily - Cardiology will arrange 30-day cardiac monitor to look for  paroxysmal A-fib  -Follow-up with neurology in 6 to 8 weeks (order placed)'   OTHER MEDICAL CONDITIONS: Meniere disease, Hypertension, Hyperlipidemia, CAD, CKD   REVIEW OF SYSTEMS: Full 14 system review of systems performed and negative with exception of: As noted in the HPI   ALLERGIES: Allergies  Allergen Reactions   Other Nausea And Vomiting and Other (See Comments)    "STRONG" PAIN MEDICATIONS IN GENERAL: HEADACHE   Tamsulosin  Anaphylaxis   Codeine Nausea And Vomiting    REACTION: Headache   Morphine  And Codeine Nausea And Vomiting   Metoprolol  Itching   Oxybutynin  Swelling   Pentazocine-Naloxone  Hcl Nausea And Vomiting   Sertraline Swelling    Swelling in knee 100 mg   Topiramate Other (See Comments)    Headache    HOME MEDICATIONS: Outpatient Medications Prior to Visit  Medication Sig Dispense Refill   acetaminophen  (TYLENOL ) 500 MG tablet Take 500 mg by mouth every 6 (six) hours as needed for fever.     Arginine 500 MG CAPS Take by mouth.     Artificial Tear Ointment (DRY EYES OP) Apply 1 application to eye as needed (dry eyes).      Biotin  5 MG CAPS Take 10 mg by mouth daily.      losartan  (COZAAR ) 50 MG tablet TAKE 1 TABLET BY MOUTH TWICE A DAILY. 180 tablet 2   nitroGLYCERIN  (NITROSTAT ) 0.4 MG SL tablet DISSOLVE 1 TABLET SUBLINGUALLY AS NEEDED FOR CHEST PAIN, MAY REPEAT EVERY 5 MINUTES. AFTER 3 CALL 911. 25 tablet 3   omeprazole (PRILOSEC) 40 MG capsule TAKE ONE CAPSULE BY MOUTH ONCE A DAY FOR HEARTBURN (TAKE ON AN EMPTY STOMACH 30 MINUTES PRIOR TO A MEAL)     ondansetron  (ZOFRAN  ODT) 4 MG disintegrating tablet Take 1 tablet (4 mg total) by mouth every 8 (eight) hours as needed for nausea or vomiting. 20 tablet 0   polyethylene glycol (MIRALAX  / GLYCOLAX ) 17 g packet Take 17 g by mouth as needed for moderate constipation.     rosuvastatin  (CRESTOR ) 20 MG tablet TAKE 1 TABLET BY MOUTH AT 6PM. 90 tablet 3   diazepam  (VALIUM ) 10 MG tablet Take 1/2 tablet by mouth 3  times daily as needed for dizziness. (Patient taking differently: Take 10 mg by mouth as needed for anxiety (vertigo). For dizziness) 90 tablet 3   clopidogrel  (PLAVIX ) 75 MG tablet Take 1 tablet (75 mg total) by mouth daily. (Patient not taking: Reported on 10/20/2023) 30 tablet 2   isosorbide  mononitrate (IMDUR ) 30 MG 24 hr tablet Take 1 tablet (30 mg total) by mouth in the morning and at bedtime. 180 tablet 3   Wheat Dextrin (BENEFIBER DRINK MIX PO) Take by mouth as needed (constipation).     No facility-administered medications prior to visit.    PAST MEDICAL HISTORY: Past Medical History:  Diagnosis Date   Adenomatous colon polyp    Anxiety    Arthritis    Asthma  Coronary artery disease    Nonobstructive at cardiac catheterization 2020   Depression    DVT (deep venous thrombosis) (HCC) 2008   Essential hypertension    GERD (gastroesophageal reflux disease)    History of kidney stones    Hyperlipidemia    Internal hemorrhoids    Peripheral neuropathy    PONV (postoperative nausea and vomiting)    PTSD (post-traumatic stress disorder)    Sleep apnea    cpap   Vertigo     PAST SURGICAL HISTORY: Past Surgical History:  Procedure Laterality Date   ANTERIOR CERVICAL DECOMP/DISCECTOMY FUSION N/A 07/09/2015   Procedure: ANTERIOR CERVICAL DECOMPRESSION/DISCECTOMY FUSION CERVICAL FIVE-SIX,CERVICAL SIX-SEVEN;  Surgeon: Garry Kansas, MD;  Location: [facility];  Service: Neurosurgery;  Laterality: N/A;   CARPAL TUNNEL RELEASE Bilateral 2014   CATARACT EXTRACTION W/PHACO Right 08/08/2022   Procedure: CATARACT EXTRACTION PHACO AND INTRAOCULAR LENS PLACEMENT (IOC);  Surgeon: Tarri Farm, MD;  Location: [facility];  Service: Ophthalmology;  Laterality: Right;  CDE 9.66   CATARACT EXTRACTION W/PHACO Left 08/22/2022   Procedure: CATARACT EXTRACTION PHACO AND INTRAOCULAR LENS PLACEMENT (IOC);  Surgeon: Tarri Farm, MD;  Location: [facility];  Service: Ophthalmology;  Laterality: Left;   CDE  7.65   COLONOSCOPY     CORONARY PRESSURE/FFR STUDY N/A 06/25/2021   Procedure: INTRAVASCULAR PRESSURE WIRE/FFR STUDY;  Surgeon: Arleen Lacer, MD;  Location: [facility] CV [facility];  Service: Cardiovascular;  Laterality: N/A;   CYSTOSCOPY/RETROGRADE/URETEROSCOPY/STONE EXTRACTION WITH BASKET Left 02/14/2014   Procedure: CYSTOSCOPY AND LEFT DOUBLE J STENT PLACEMENT;  ATTEMPTED STONE  EXTRACTION ;  Surgeon: Roque Collar, MD;  Location: [facility];  Service: Urology;  Laterality: Left;   ENDOLYMPHATIC SAC DECOMPRESSION Left 06/14/2019   Procedure: ENDOLYMPHATIC SAC DECOMPRESSION;  Surgeon: Reynold Caves, MD;  Location: [facility];  Service: ENT;  Laterality: Left;   ESOPHAGOGASTRODUODENOSCOPY ENDOSCOPY     HEMORRHOID BANDING     KNEE SURGERY Right 2006   Arthroscopy - MCl tear   LEFT HEART CATH AND CORONARY ANGIOGRAPHY N/A 08/17/2018   Procedure: LEFT HEART CATH AND CORONARY ANGIOGRAPHY;  Surgeon: Odie Benne, MD;  Location: [facility];  Service: Cardiovascular;  Laterality: N/A;   LEFT HEART CATH AND CORONARY ANGIOGRAPHY N/A 06/25/2021   Procedure: LEFT HEART CATH AND CORONARY ANGIOGRAPHY;  Surgeon: Arleen Lacer, MD;  Location: [facility] INV[facility] CV LAB;  Service: Cardiovascular;  Laterality: N/A;   UPPER GASTROINTESTINAL ENDOSCOPY      FAMILY HISTORY: Family History  Problem Relation Age of Onset   Hypertension Father    Hyperlipidemia Father    Lung cancer Father        from piece of abestos lodged in his lung   Thyroid  cancer Mother    Colon cancer Brother 63   Colon polyps Brother    Bladder Cancer Brother    Esophageal cancer Neg Hx    Stomach cancer Neg Hx    Rectal cancer Neg Hx     SOCIAL HISTORY: Social History   Socioeconomic History   Marital status: Married    Spouse name: Andrew Bates   Number of children: 2   Years of education: Not on file   Highest education level: Not on file  Occupational History   Occupation: retired/diabled   Tobacco Use   Smoking status: Former    Types: Cigars    Quit date: 1994    Years since quitting: 31.3   Smokeless tobacco: Never   Tobacco comments:  quit 15 years ago (2017) never smoked alot  Vaping Use   Vaping status: Never Used  Substance and Sexual Activity   Alcohol use: No   Drug use: No   Sexual activity: Not on file  Other Topics Concern   Not on file  Social History Narrative   Not on file   Social Drivers of Health   Financial Resource Strain: Not on file  Food Insecurity: No Food Insecurity (09/15/2023)   Hunger Vital Sign    Worried About Running Out of Food in the Last Year: Never true    Ran Out of Food in the Last Year: Never true  Transportation Needs: No Transportation Needs (09/15/2023)   PRAPARE - Administrator, Civil Service (Medical): No    Bates of Transportation (Non-Medical): No  Physical Activity: Not on file  Stress: Not on file  Social Connections: Moderately Isolated (09/15/2023)   Social Connection and Isolation Panel [NHANES]    Frequency of Communication with Friends and Family: Once a week    Frequency of Social Gatherings with Friends and Family: Once a week    Attends Religious Services: 1 to 4 times per year    Active Member of [employer] or Organizations: No    Attends Banker Meetings: Never    Marital Status: Married  Catering manager Violence: Not At Risk (09/15/2023)   Humiliation, Afraid, Rape, and Kick questionnaire    Fear of Current or Ex-Partner: No    Emotionally Abused: No    Physically Abused: No    Sexually Abused: No    PHYSICAL EXAM  GENERAL EXAM/CONSTITUTIONAL: Vitals:  Vitals:   10/20/23 1536  Weight: 204 lb (92.5 kg)  Height: 6\' 2"  (1.88 m)   Body mass index is 26.19 kg/m. Wt Readings from Last 3 Encounters:  10/20/23 204 lb (92.5 kg)  09/15/23 205 lb 7.5 oz (93.2 kg)  09/14/23 208 lb (94.3 kg)   Orthostatic Vitals for the past 48 hrs (Last 6 readings):  Cuff Size Patient  Position (if appropriate) BP- Sitting Pulse- Sitting BP- Lying Pulse- Lying  10/20/23 1536 Normal -- -- -- -- --  10/20/23 1547 -- Orthostatic Vitals 131/83 78 138/81 69    Patient is in no distress; well developed, nourished and groomed; neck is supple  MUSCULOSKELETAL: Gait, strength, tone, movements noted in Neurologic exam below  NEUROLOGIC: MENTAL STATUS:      No data to display         awake, alert, oriented to person, place and time recent and remote memory intact normal attention and concentration language fluent, comprehension intact, naming intact fund of knowledge appropriate  CRANIAL NERVE:  2nd, 3rd, 4th, 6th - Visual fields full to confrontation, extraocular muscles intact, no nystagmus 5th - facial sensation symmetric 7th - facial strength symmetric 8th - hearing intact 9th - palate elevates symmetrically, uvula midline 11th - shoulder shrug symmetric 12th - tongue protrusion midline  MOTOR:  normal bulk and tone, full strength in the BUE, BLE  SENSORY:  normal and symmetric to light touch  COORDINATION:  finger-nose-finger, fine finger movements normal  GAIT/STATION:  Mild unsteadiness    DIAGNOSTIC DATA (LABS, IMAGING, TESTING) - I reviewed patient records, labs, notes, testing and imaging myself where available.  Lab Results  Component Value Date   WBC 7.1 09/16/2023   HGB 14.6 09/16/2023   HCT 44.7 09/16/2023   MCV 99.3 09/16/2023   PLT 203 09/16/2023      Component Value Date/Time  NA 138 09/16/2023 0415   K 4.1 09/16/2023 0415   CL 108 09/16/2023 0415   CO2 24 09/16/2023 0415   GLUCOSE 89 09/16/2023 0415   BUN 21 09/16/2023 0415   CREATININE 1.38 (H) 09/16/2023 0415   CALCIUM  8.7 (L) 09/16/2023 0415   PROT 6.5 09/15/2023 1006   ALBUMIN 4.0 09/15/2023 1006   AST 18 09/15/2023 1006   ALT 15 09/15/2023 1006   ALKPHOS 70 09/15/2023 1006   BILITOT 0.5 09/15/2023 1006   GFRNONAA 53 (L) 09/16/2023 0415   GFRAA 53 (L) 06/14/2019  1437   Lab Results  Component Value Date   CHOL 111 09/16/2023   HDL 40 (L) 09/16/2023   LDLCALC 50 09/16/2023   TRIG 103 09/16/2023   CHOLHDL 2.8 09/16/2023   Lab Results  Component Value Date   HGBA1C 5.7 (H) 06/22/2021   Lab Results  Component Value Date   VITAMINB12 311 02/26/2015   Lab Results  Component Value Date   TSH 1.143 02/25/2015    MRI Brain 09/15/2023 No acute or reversible finding. Low moderate chronic small-vessel ischemic changes of the cerebral hemispheric white matter, similar to the study of November 2020, only minimally progressive  MRA Head and Neck 09/15/2023 Normal intracranial MR angiography of the large and medium size vessels   ASSESSMENT AND PLAN  78 y.o. year old male with history of Mnire disease, hypertension, hyperlipidemia, CKD, CAD who is presenting after an episode of complete vision loss lasting about 10-minutes, followed by dizziness/vertigo.  His MRI was negative for any acute stroke, angiogram did not show any critical stenosis.  He did have a cardiac monitor, pending results.  Patient was diagnosed with MRI negative stroke versus TIA, his symptoms lasted about 10 minutes per patient.  He completed DAPT for 21 days and currently on aspirin  alone.  I have advised patient to continue with current medications, I wanted to send him for vestibular therapy but he would like to discuss with his ENT physician first.  Also recommended him to discuss betahistine as a treatment for his Mnire's disease.  He tells me that he never tried a medication before.  I also provided him with course of Valium  until his ENT physician return in office.  Continue to follow with your doctors and return as needed.   1. TIA (transient ischemic attack)   2. Vertigo   3. Meniere's disease of left ear      Patient Instructions  Continue current medications I will give him a prescription of Valium , one-time only until his ENT physician return in office I have  recommended vestibular therapy but he would like to discuss that first with his ENT physician, I also advised him to discuss betahistine for his treatment of Mnire disease Continue follow-up with your doctors return as needed.  No orders of the defined types were placed in this encounter.   Meds ordered this encounter  Medications   diazepam  (VALIUM ) 10 MG tablet    Sig: Take 1/2 tablet by mouth 3 times daily as needed for dizziness.    Dispense:  90 tablet    Refill:  0    Return if symptoms worsen or fail to improve.  I have spent a total of 75 minutes dedicated to this patient today, preparing to see patient, performing a medically appropriate examination and evaluation, ordering tests and/or medications and procedures, and counseling and educating the patient/family/caregiver; independently interpreting result and communicating results to the family/patient/caregiver; and documenting clinical information in the  electronic medical record.   Cassandra Cleveland, MD 10/21/2023, 10:29 AM  [facility] [address] [place], [place] [address] [phone]

## 2023-10-21 ENCOUNTER — Encounter: Payer: Self-pay | Admitting: Neurology

## 2023-10-21 NOTE — Patient Instructions (Signed)
 Continue current medications I will give him a prescription of Valium , one-time only until his ENT physician return in office I have recommended vestibular therapy but he would like to discuss that first with his ENT physician, I also advised him to discuss betahistine for his treatment of Mnire disease Continue follow-up with your doctors return as needed.

## 2023-10-30 ENCOUNTER — Telehealth (INDEPENDENT_AMBULATORY_CARE_PROVIDER_SITE_OTHER): Payer: Self-pay | Admitting: Otolaryngology

## 2023-10-30 ENCOUNTER — Ambulatory Visit: Payer: PPO | Attending: Cardiology | Admitting: Cardiology

## 2023-10-30 ENCOUNTER — Encounter: Payer: Self-pay | Admitting: Cardiology

## 2023-10-30 VITALS — BP 138/74 | HR 66 | Ht 74.0 in | Wt 205.8 lb

## 2023-10-30 DIAGNOSIS — I25119 Atherosclerotic heart disease of native coronary artery with unspecified angina pectoris: Secondary | ICD-10-CM | POA: Diagnosis not present

## 2023-10-30 DIAGNOSIS — I2089 Other forms of angina pectoris: Secondary | ICD-10-CM

## 2023-10-30 DIAGNOSIS — E782 Mixed hyperlipidemia: Secondary | ICD-10-CM

## 2023-10-30 DIAGNOSIS — I10 Essential (primary) hypertension: Secondary | ICD-10-CM | POA: Diagnosis not present

## 2023-10-30 MED ORDER — ISOSORBIDE MONONITRATE ER 30 MG PO TB24
ORAL_TABLET | ORAL | 3 refills | Status: DC
Start: 2023-10-30 — End: 2023-11-20

## 2023-10-30 NOTE — Patient Instructions (Signed)
 Medication Instructions:   INCREASE Imdur  to 60 mg am and 30 mg pm   Labwork: None today  Testing/Procedures: None today  Follow-Up: 3 months  Any Other Special Instructions Will Be Listed Below (If Applicable).  If you need a refill on your cardiac medications before your next appointment, please call your pharmacy.

## 2023-10-30 NOTE — Progress Notes (Signed)
 Cardiology Office Note  Date: 10/30/2023   ID: Andrew Bates, DOB 15-Apr-1946, MRN [id number]  History of Present Illness: Andrew Bates is a 78 y.o. male last seen in November 2024.  He is here today with his wife for a follow-up visit.  I reviewed interval records.  He was hospitalized in March with suspected TIA, brain MRI did not demonstrate acute stroke.  Carotid Dopplers showed no significant ICA stenosis and echocardiogram revealed normal LVEF with no obvious interatrial communication.  He did wear an outpatient cardiac monitor thereafter which showed no evidence of atrial fibrillation.  Follow-up with neurology noted in April, also mention of vertigo and Mnire's disease as potential contributors to his symptoms.  We discussed his symptoms in detail today.  In addition to the visual and balance problems he has had, he continues to report intermittent chest discomfort which we are managing as microvascular angina in light of his cardiac workup over time.  There does not look to be any specific precipitant to his symptoms.  He reports bradycardia with heart rate in the 40s at times, his cardiac monitor did not demonstrate any pauses or heart block however.  He is not on any AV nodal blockers.  I reviewed his medications.  He tells me that he stopped Plavix  due to concerns about side effects, has been taking aspirin  81 mg twice daily.  He states that he discussed this with neurology.  He also remains on Crestor  with his LDL well-controlled in the 50s.  Physical Exam: VS:  BP 138/74   Pulse 66   Ht 6\' 2"  (1.88 m)   Wt 205 lb 12.8 oz (93.4 kg)   SpO2 95%   BMI 26.42 kg/m , BMI Body mass index is 26.42 kg/m.  Wt Readings from Last 3 Encounters:  10/30/23 205 lb 12.8 oz (93.4 kg)  10/20/23 204 lb (92.5 kg)  09/15/23 205 lb 7.5 oz (93.2 kg)    General: Patient appears comfortable at rest. HEENT: Conjunctiva and lids normal. Neck: Supple, no elevated JVP or carotid bruits. Lungs:  Clear to auscultation, nonlabored breathing at rest. Cardiac: Regular rate and rhythm, no S3 or significant systolic murmur, no pericardial rub.  ECG:  An ECG dated 09/15/2023 was personally reviewed today and demonstrated:  Sinus bradycardia with IVCD.  Labwork: 09/15/2023: ALT 15; AST 18 09/16/2023: BUN 21; Creatinine, Ser 1.38; Hemoglobin 14.6; Magnesium  1.9; Platelets 203; Potassium 4.1; Sodium 138     Component Value Date/Time   CHOL 111 09/16/2023 1237   TRIG 103 09/16/2023 1237   HDL 40 (L) 09/16/2023 1237   CHOLHDL 2.8 09/16/2023 1237   VLDL 21 09/16/2023 1237   LDLCALC 50 09/16/2023 1237   Other Studies Reviewed Today:  Carotid Dopplers 09/15/2023: IMPRESSION: Color duplex indicates minimal homogeneous plaque, with no hemodynamically significant stenosis by duplex criteria in the extracranial cerebrovascular circulation.  Echocardiogram 09/16/2023:  1. Left ventricular ejection fraction, by estimation, is 60 to 65%. The  left ventricle has normal function. The left ventricle has no regional  wall motion abnormalities. Left ventricular diastolic parameters are  consistent with Grade I diastolic  dysfunction (impaired relaxation).   2. Right ventricular systolic function is normal. The right ventricular  size is normal. There is normal pulmonary artery systolic pressure.   3. The mitral valve is grossly normal. No evidence of mitral valve  regurgitation.   4. The aortic valve is tricuspid. Aortic valve regurgitation is not  visualized. No aortic stenosis is present.  5. The inferior vena cava is normal in size with greater than 50%  respiratory variability, suggesting right atrial pressure of 3 mmHg.   30-day cardiac monitor April 2025: Predominant rhythm is sinus, heart rate ranged from 41 bpm up 244 bpm with average heart rate 62 bpm.  There were rare PVCs representing 1% total beats and rare PACs representing less than 1% total beats.  No atrial fibrillation documented.   No pauses or high degree heart block.  Assessment and Plan:  1.  Nonobstructive CAD by cardiac catheterization in January 2023.  He has a history of recurring chest discomfort which we are managing his microvascular angina.  He is on Imdur  which we will increase to 60 mg in the morning and 30 mg in the evening.  Continue Crestor  20 mg daily as well.   2.  Primary hypertension.  Blood pressure reasonable today, continue Cozaar  50 mg twice daily and continue to track.   3.  Mixed hyperlipidemia.  LDL 50 in March.  Continue Crestor  20 mg daily.  4.  Suspected TIA by workup in March as discussed above.  No atrial fibrillation documented on subsequent outpatient cardiac monitor.  He did not tolerate Plavix  and is now taking aspirin  81 mg twice daily.  Continue Crestor  20 mg daily.  5.  Reported intermittent bradycardia, not entirely clear that this is associated with his intermittent symptoms.  Cardiac monitor showed that his average heart rate was 62 beats per minutes and did get to a low of 41 bpm.  No pauses or heart block noted however.  He is not on any AV nodal blockers.  Disposition:  Follow up 3 months.  Signed, Gerard Knight, M.D., F.A.C.C. [facility]

## 2023-10-30 NOTE — Telephone Encounter (Signed)
 Called patient back several times, phone number is invalid. Patient need a follow up appointment with dr. Darlin Ehrlich for dizziness.

## 2023-11-20 ENCOUNTER — Telehealth: Payer: Self-pay | Admitting: Cardiology

## 2023-11-20 MED ORDER — NITROGLYCERIN 0.4 MG SL SUBL: 0.4000 mg | SUBLINGUAL_TABLET | SUBLINGUAL | 3 refills | Status: DC | PRN

## 2023-11-20 MED ORDER — ROSUVASTATIN CALCIUM 20 MG PO TABS: 20.0000 mg | ORAL_TABLET | Freq: Every day | ORAL | 3 refills | Status: AC

## 2023-11-20 MED ORDER — ISOSORBIDE MONONITRATE ER 30 MG PO TB24: ORAL_TABLET | ORAL | 3 refills | Status: DC

## 2023-11-20 MED ORDER — LOSARTAN POTASSIUM 50 MG PO TABS: 50.0000 mg | ORAL_TABLET | Freq: Two times a day (BID) | ORAL | 3 refills | Status: DC

## 2023-11-20 NOTE — Telephone Encounter (Signed)
 Pt came into office stating that he would like all of his meds moved to the [place] pharmacy. The pharmacy is also requesting 90 day supplies as well. The meds are:   [place] Potassium 50 mg tab Isosorbide  Mononit E R 30 mg TD Nitroglycerin  0.4 mg tab Rosuvastatin  Calcium  20 mg tab  Numbers from pt: Pharmacy Refill: [phone].  Pt stated he goes to the [facility] but the meds come from [facility].

## 2023-11-20 NOTE — Telephone Encounter (Signed)
 Pt's medications were sent to pt's pharmacy as requested. Confirmation received.

## 2023-11-23 ENCOUNTER — Telehealth: Payer: Self-pay | Admitting: Neurology

## 2023-11-23 ENCOUNTER — Telehealth (INDEPENDENT_AMBULATORY_CARE_PROVIDER_SITE_OTHER): Payer: Self-pay | Admitting: Otolaryngology

## 2023-11-23 ENCOUNTER — Other Ambulatory Visit: Payer: Self-pay | Admitting: *Deleted

## 2023-11-23 MED ORDER — NITROGLYCERIN 0.4 MG SL SUBL: 0.4000 mg | SUBLINGUAL_TABLET | SUBLINGUAL | 3 refills | Status: AC | PRN

## 2023-11-23 MED ORDER — ISOSORBIDE MONONITRATE ER 30 MG PO TB24: ORAL_TABLET | ORAL | 3 refills | Status: DC

## 2023-11-23 MED ORDER — LOSARTAN POTASSIUM 50 MG PO TABS: 50.0000 mg | ORAL_TABLET | Freq: Two times a day (BID) | ORAL | 3 refills | Status: DC

## 2023-11-23 NOTE — Telephone Encounter (Signed)
 He will get his Valium  from ENT

## 2023-11-23 NOTE — Telephone Encounter (Signed)
 Patient would like medications moved to the [facility].  [phone]

## 2023-11-23 NOTE — Telephone Encounter (Signed)
 Requested Prescriptions   Pending Prescriptions Disp Refills   diazepam  (VALIUM ) 10 MG tablet 90 tablet 0    Sig: Take 1/2 tablet by mouth 3 times daily as needed for dizziness.   Last seen 10/20/23 Next appt not scheduled   Dispenses   Dispensed Days Supply Quantity Provider Pharmacy  diazepam  10 mg tablet 11/03/2023 60 90 each Cassandra Cleveland, MD [facility] In...  diazepam  10 mg tablet 09/08/2023 60 90 each Reynold Caves, MD [facility] In...  diazepam  10 mg tablet 07/06/2023 60 90 each Reynold Caves, MD [facility] In...  diazepam  10 mg tablet 05/02/2023 60 90 each Reynold Caves, MD [facility] In...  DIAZEPAM  10 MG TABLET 02/26/2023 60 90 each Reynold Caves, MD [facility] In...  DIAZEPAM  10 MG TAB 12/29/2022 60 90 tablet Reynold Caves, MD [facility] In.[place]asA[place] Aas

## 2023-11-23 NOTE — Telephone Encounter (Signed)
 [facility] states pt informed them that he wants the diazepam  (VALIUM ) 10 MG tablet transferred to the [facility] hospital so they can do a 90 day for him

## 2023-11-25 ENCOUNTER — Other Ambulatory Visit (HOSPITAL_COMMUNITY): Payer: Self-pay

## 2023-11-25 ENCOUNTER — Telehealth: Payer: Self-pay | Admitting: Pharmacy Technician

## 2023-11-25 NOTE — Telephone Encounter (Signed)
 Received notification that losartan  needed a prior authorization but patient insurance was coming up part b only. I called the patient and he said he does not have prescription insurance right now. He said he wants the losartan  on cash price right now and he will get his medications sent to the [place]. I called [facility] and asked them to fill losartan  on cash/discount for now at patient request

## 2023-11-30 ENCOUNTER — Telehealth (INDEPENDENT_AMBULATORY_CARE_PROVIDER_SITE_OTHER): Payer: Self-pay | Admitting: Otolaryngology

## 2023-11-30 NOTE — Telephone Encounter (Signed)
 Needs diazepam  prescription refilled.  Has new pharmacy.  Please call [phone]

## 2023-12-08 ENCOUNTER — Other Ambulatory Visit (INDEPENDENT_AMBULATORY_CARE_PROVIDER_SITE_OTHER): Payer: Self-pay | Admitting: Otolaryngology

## 2023-12-10 ENCOUNTER — Telehealth (INDEPENDENT_AMBULATORY_CARE_PROVIDER_SITE_OTHER): Payer: Self-pay | Admitting: Otolaryngology

## 2023-12-10 NOTE — Telephone Encounter (Signed)
 Patient called and requested that his prescription for valium  10 mg be transferred to the [place] in [place], [place].There was an error message from the [place] upon trying to fulfill this request. Therefore, the prescription was not transferred.Patient is aware.

## 2023-12-14 ENCOUNTER — Ambulatory Visit: Admitting: Neurology

## 2024-01-01 DIAGNOSIS — G459 Transient cerebral ischemic attack, unspecified: Secondary | ICD-10-CM

## 2024-01-06 ENCOUNTER — Ambulatory Visit: Payer: Self-pay | Admitting: Internal Medicine

## 2024-01-07 ENCOUNTER — Encounter (INDEPENDENT_AMBULATORY_CARE_PROVIDER_SITE_OTHER): Payer: Self-pay | Admitting: Otolaryngology

## 2024-01-07 ENCOUNTER — Ambulatory Visit (INDEPENDENT_AMBULATORY_CARE_PROVIDER_SITE_OTHER): Admitting: Otolaryngology

## 2024-01-07 VITALS — BP 135/81 | HR 76

## 2024-01-07 DIAGNOSIS — H903 Sensorineural hearing loss, bilateral: Secondary | ICD-10-CM | POA: Diagnosis not present

## 2024-01-07 DIAGNOSIS — H8111 Benign paroxysmal vertigo, right ear: Secondary | ICD-10-CM

## 2024-01-07 DIAGNOSIS — H8102 Meniere's disease, left ear: Secondary | ICD-10-CM

## 2024-01-07 DIAGNOSIS — R42 Dizziness and giddiness: Secondary | ICD-10-CM

## 2024-01-09 NOTE — Progress Notes (Signed)
 Patient ID: Andrew Bates, male   DOB: Feb 03, 1946, 78 y.o.   MRN: [id number]  Follow-up: Recurrent dizziness, left ear Mnire's disease, bilateral high-frequency sensorineural hearing loss  HPI: The patient is a 78 year old male who returns today for his follow-up evaluation.  He has a complex medical history, including recurrent dizziness, bilateral sensorineural hearing loss, left ear Mnire's disease, hypertension, coronary artery disease, recent TIA, and recurrent BPPV.  He was previously treated with left endolymphatic sac decompression surgery in 2020, with good control of his dizziness for several years.  Over the past year, the patient has noted more recurrent dizziness.  His clinical course was complicated by a recent TIA, visual loss, and multiple falls.  The patient returns today complaining of more recurrent spinning vertigo.  He describes the vertigo as a brief spinning sensation, especially with sudden head movement.  He denies any recent change in his hearing.  He denies any otalgia or otorrhea.  Exam: General: Communicates without difficulty, well nourished, no acute distress. Head: Normocephalic, no evidence injury, no tenderness, facial buttresses intact without stepoff. Face/sinus: No tenderness to palpation and percussion. Facial movement is normal and symmetric. Eyes: PERRL, EOMI. No scleral icterus, conjunctivae clear. Neuro: CN II exam reveals vision grossly intact.  No nystagmus at any point of gaze. Ears: Auricles well formed without lesions.  Ear canals are intact without mass or lesion.  No erythema or edema is appreciated.  The TMs are intact without fluid. Nose: External evaluation reveals normal support and skin without lesions.  Dorsum is intact.  Anterior rhinoscopy reveals congested mucosa over anterior aspect of inferior turbinates and intact septum.  No purulence noted. Oral:  Oral cavity and oropharynx are intact, symmetric, without erythema or edema.  Mucosa is moist  without lesions. Neck: Full range of motion without pain.  There is no significant lymphadenopathy.  No masses palpable.  Thyroid  bed within normal limits to palpation.  Parotid glands and submandibular glands equal bilaterally without mass.  Trachea is midline. Neuro:  CN 2-12 grossly intact.  Gait: Wide-based. Vestibular: Dix-Hallpike produces rotational nystagmus with right-sided positioning. Vestibular: There is no nystagmus with pneumatic pressure on either tympanic membrane or Valsalva. The cerebellar examination is unremarkable.    Procedure: Right-sided Epley maneuver  Anesthesia: None Indication: To treat the right BPPV Desciption:  The patient is first placed in a right-sided Dix-Hallpike position. After the vertigo has subsided, the head is gradually rotated from the right to the left, completing a 180 turn. The patient is then returned to the upright position. The patient tolerated the procedure well without any difficulty.    Assessment: 1.  Recurrent dizziness, likely secondary to right-sided benign paroxysmal positional vertigo.  His right Dix-Hallpike maneuver is positive. 2.  The patient also has chronic left ear Mnire's disease.  He was previously treated with left endolymphatic sac decompression surgery, with good control of his dizziness for several years. 3.  His ear canals, tympanic membranes, and middle ear spaces are normal. 4.  Subjectively stable bilateral high-frequency sensorineural hearing loss.  Plan: 1.  The physical exam findings are reviewed with the patient. 2.  The pathophysiology of dizziness, Mnire's disease, and BPPV are extensively discussed with the patient. 3.  The right Epley maneuver is performed today without difficulty. 4.  Post Epley activity restrictions are reviewed. 5.  Continue with his low-salt diet and Valium  as needed. 6.  The patient requested a refill for his Valium . 7.  The patient will return for reevaluation in 1  month, sooner if  needed.

## 2024-01-14 ENCOUNTER — Telehealth: Payer: Self-pay | Admitting: Cardiology

## 2024-01-14 NOTE — Telephone Encounter (Signed)
 Patient stated he wants Dr. Debera to know if there is any product - pill, capsule, powder or lotion that would be safe for him to use for ED.  Patient wants to get a prescription.

## 2024-01-15 NOTE — Telephone Encounter (Signed)
 Pt notified of Dr. Ival Bible response. Pt voiced understanding.

## 2024-02-09 ENCOUNTER — Ambulatory Visit: Admitting: Cardiology

## 2024-02-12 ENCOUNTER — Encounter: Payer: Self-pay | Admitting: Cardiology

## 2024-02-12 ENCOUNTER — Ambulatory Visit: Attending: Cardiology | Admitting: Cardiology

## 2024-02-12 VITALS — BP 128/80 | HR 69 | Ht 74.0 in | Wt 200.2 lb

## 2024-02-12 DIAGNOSIS — I25119 Atherosclerotic heart disease of native coronary artery with unspecified angina pectoris: Secondary | ICD-10-CM | POA: Diagnosis not present

## 2024-02-12 DIAGNOSIS — I10 Essential (primary) hypertension: Secondary | ICD-10-CM

## 2024-02-12 DIAGNOSIS — E782 Mixed hyperlipidemia: Secondary | ICD-10-CM

## 2024-02-12 DIAGNOSIS — I2089 Other forms of angina pectoris: Secondary | ICD-10-CM

## 2024-02-12 NOTE — Patient Instructions (Signed)
 Medication Instructions:  Your physician recommends that you continue on your current medications as directed. Please refer to the Current Medication list given to you today.   Labwork: None today  Testing/Procedures: None today  Follow-Up: 6 months  Any Other Special Instructions Will Be Listed Below (If Applicable).  If you need a refill on your cardiac medications before your next appointment, please call your pharmacy.

## 2024-02-12 NOTE — Progress Notes (Signed)
    Cardiology Office Note  Date: 02/12/2024   ID: Usbaldo, Pannone 04/20/1946, MRN [id number]  History of Present Illness: Andrew Bates is a 78 y.o. male last seen in May.  He is here today with his wife for a follow-up visit.  Reports no increasing chest discomfort, in fact has cut his Imdur  back to 30 mg once in the morning.  He has lost about 8 pounds by cutting back portion sizes, has also been trying to walk for exercise.  No palpitations or syncope.  We reviewed his medications.  Remainder of his cardiac regimen is stable.  Interval lab work is noted below.  Physical Exam: VS:  BP 128/80 (BP Location: Right Arm, Cuff Size: Normal)   Pulse 69   Ht 6' 2 (1.88 m)   Wt 200 lb 3.2 oz (90.8 kg)   SpO2 98%   BMI 25.70 kg/m , BMI Body mass index is 25.7 kg/m.  Wt Readings from Last 3 Encounters:  02/12/24 200 lb 3.2 oz (90.8 kg)  10/30/23 205 lb 12.8 oz (93.4 kg)  10/20/23 204 lb (92.5 kg)    General: Patient appears comfortable at rest. HEENT: Conjunctiva and lids normal. Neck: Supple, no elevated JVP or carotid bruits. Lungs: Clear to auscultation, nonlabored breathing at rest. Cardiac: Regular rate and rhythm, no S3 or significant systolic murmur. Abdomen: Soft, bowel sounds present, no bruits. Extremities: No pitting edema.  ECG:  An ECG dated 09/15/2023 was personally reviewed today and demonstrated:  Sinus bradycardia with IVCD.  Labwork: 09/15/2023: ALT 15; AST 18 09/16/2023: BUN 21; Creatinine, Ser 1.38; Hemoglobin 14.6; Magnesium  1.9; Platelets 203; Potassium 4.1; Sodium 138     Component Value Date/Time   CHOL 111 09/16/2023 1237   TRIG 103 09/16/2023 1237   HDL 40 (L) 09/16/2023 1237   CHOLHDL 2.8 09/16/2023 1237   VLDL 21 09/16/2023 1237   LDLCALC 50 09/16/2023 1237   Other Studies Reviewed Today:  No interval cardiac testing for review today.  Assessment and Plan:  1.  Nonobstructive CAD by cardiac catheterization in January 2023.  He has a history  of recurring chest discomfort which we are managing as microvascular angina.  At this point clinically stable on Imdur  30 mg daily which he cut back since last visit.  Otherwise continue aspirin  81 mg daily and Crestor  20 mg daily.   2.  Primary hypertension.  No change in current regimen, he continues on Cozaar  50 mg twice daily.   3.  Mixed hyperlipidemia.  LDL 50 in March.  Continue Crestor  20 mg daily.   4.  History of possible TIA by workup in March (also mention of vertigo and Mnire's disease as possible contributors per neurology). No atrial fibrillation documented on subsequent outpatient cardiac monitor.  He did not tolerate Plavix  and is now taking aspirin  81 mg daily.  Continue Crestor  20 mg daily.  Disposition:  Follow up 6 months.  Signed, Jayson JUDITHANN Sierras, M.D., F.A.C.C. [facility] at [facility]

## 2024-02-19 ENCOUNTER — Encounter (INDEPENDENT_AMBULATORY_CARE_PROVIDER_SITE_OTHER): Payer: Self-pay | Admitting: Otolaryngology

## 2024-02-19 ENCOUNTER — Ambulatory Visit (INDEPENDENT_AMBULATORY_CARE_PROVIDER_SITE_OTHER): Payer: Medicare Other | Admitting: Otolaryngology

## 2024-02-19 VITALS — BP 132/74 | HR 62

## 2024-02-19 DIAGNOSIS — R42 Dizziness and giddiness: Secondary | ICD-10-CM

## 2024-02-19 DIAGNOSIS — H8102 Meniere's disease, left ear: Secondary | ICD-10-CM | POA: Diagnosis not present

## 2024-02-19 DIAGNOSIS — H8111 Benign paroxysmal vertigo, right ear: Secondary | ICD-10-CM

## 2024-02-19 DIAGNOSIS — H903 Sensorineural hearing loss, bilateral: Secondary | ICD-10-CM | POA: Diagnosis not present

## 2024-02-21 NOTE — Progress Notes (Signed)
 Patient ID: Andrew Bates, male   DOB: 1945/12/29, 78 y.o.   MRN: [id number]  Follow-up: More recurrent dizziness, left ear Mnire's disease, bilateral sensorineural hearing loss  HPI: The patient is a 78 year old male who returns today for his follow-up evaluation.  The patient has a complex medical history, including recurrent dizziness, bilateral sensorineural hearing loss, left ear Mnire's disease, hypertension, coronary artery disease, TIAs, and recurrent BPPV. He was previously treated with left endolymphatic sac decompression surgery in 2020, with good control of his dizziness for several years. Over the past year, the patient has noted more recurrent dizziness. His clinical course was complicated by TIAs, visual loss, and multiple falls.  At his last visit in July 2025, his right Epley maneuver was positive.  He was treated with the right Epley maneuver.  He is also on a low-salt diet and is using Valium  regularly to control his dizziness.  The patient returns today reporting more recurrent dizziness.  He has been having recurrent spinning vertigo that last for several minutes.  He also has significant difficulty with his balance.  He denies any change in his hearing.  Currently he has no otalgia or otorrhea.  He has not had any recent fall.  Exam: General: Communicates without difficulty, well nourished, no acute distress. Head: Normocephalic, no evidence injury, no tenderness, facial buttresses intact without stepoff. Face/sinus: No tenderness to palpation and percussion. Facial movement is normal and symmetric. Eyes: PERRL, EOMI. No scleral icterus, conjunctivae clear. Neuro: CN II exam reveals vision grossly intact.  No nystagmus at any point of gaze. Ears: Auricles well formed without lesions.  Ear canals are intact without mass or lesion.  No erythema or edema is appreciated.  The TMs are intact without fluid. Nose: External evaluation reveals normal support and skin without lesions.  Dorsum  is intact.  Anterior rhinoscopy reveals congested mucosa over anterior aspect of inferior turbinates and intact septum.  No purulence noted. Oral:  Oral cavity and oropharynx are intact, symmetric, without erythema or edema.  Mucosa is moist without lesions. Neck: Full range of motion without pain.  There is no significant lymphadenopathy.  No masses palpable.  Thyroid  bed within normal limits to palpation.  Parotid glands and submandibular glands equal bilaterally without mass.  Trachea is midline. Neuro:  CN 2-12 grossly intact.  Gait: Wide-based. Vestibular: Dix-Hallpike produces rotational nystagmus with right-sided positioning. Vestibular: There is no nystagmus with pneumatic pressure on either tympanic membrane or Valsalva. The cerebellar examination is unremarkable.     Procedure: Right-sided Epley maneuver  Anesthesia: None Indication: To treat the right BPPV Desciption:  The patient is first placed in a right-sided Dix-Hallpike position. After the vertigo has subsided, the head is gradually rotated from the right to the left, completing a 180 turn. The patient is then returned to the upright position. The patient tolerated the procedure well without any difficulty.     Assessment: 1.  Recurrent dizziness, possibly a result of multiple contributing factors.  His right Dix-Hallpike maneuver is positive today, consistent with right recurrent benign paroxysmal positional vertigo. 2.  Left ear Mnire's disease, status post left endolymphatic sac decompression surgery.  He is currently on a low-salt diet. 3.  His ear canals, tympanic membranes, and middle ear spaces are normal. 4.  Subjectively stable bilateral sensorineural hearing loss.  Plan: 1.  The physical exam findings are reviewed with the patient. 2.  The pathophysiology of dizziness, Mnire's disease, and BPPV are again discussed with the patient and his wife.  3.  The right Epley maneuver is performed today. 4.  Post Epley activity  restrictions are reviewed. 5.  Continue with his 1500 mg low-salt diet and Valium .  The patient requested a paper prescription to transfer his Valium  prescription to the [facility]. 6.  The patient will return for reevaluation in 2 months, sooner if needed.

## 2024-02-24 ENCOUNTER — Encounter (INDEPENDENT_AMBULATORY_CARE_PROVIDER_SITE_OTHER): Payer: Self-pay | Admitting: Otolaryngology

## 2024-02-24 ENCOUNTER — Ambulatory Visit (INDEPENDENT_AMBULATORY_CARE_PROVIDER_SITE_OTHER): Admitting: Otolaryngology

## 2024-02-24 VITALS — BP 132/80 | HR 62

## 2024-02-24 DIAGNOSIS — H8102 Meniere's disease, left ear: Secondary | ICD-10-CM

## 2024-02-24 DIAGNOSIS — H8112 Benign paroxysmal vertigo, left ear: Secondary | ICD-10-CM

## 2024-02-24 DIAGNOSIS — H903 Sensorineural hearing loss, bilateral: Secondary | ICD-10-CM | POA: Diagnosis not present

## 2024-02-24 DIAGNOSIS — R42 Dizziness and giddiness: Secondary | ICD-10-CM

## 2024-02-24 NOTE — Progress Notes (Signed)
 Patient ID: Andrew Bates, male   DOB: 10-26-1945, 78 y.o.   MRN: [id number]  CC: More recurrent dizziness  HPI: The patient is a 78 year old male who walk-in to the clinic today complaining of more recurrent dizziness. The patient has a complex medical history, including recurrent dizziness, bilateral sensorineural hearing loss, left ear Mnire's disease, hypertension, coronary artery disease, TIAs, and recurrent BPPV. He was previously treated with left endolymphatic sac decompression surgery in 2020, with good control of his dizziness for several years. Over the past year, the patient has noted more recurrent dizziness. His clinical course was complicated by TIAs, visual loss, and multiple falls.  The patient was last seen 1 week ago.  At that time, his right Dix-Hallpike maneuver was positive.  He was treated with a right Epley maneuver.  His ear canals, tympanic membranes, and middle ear spaces were normal.  His bilateral hearing loss was subjectively stable.  The patient returns today reporting that he was doing well until 2 days ago, when he started experiencing more recurrent dizziness.  He is having significant difficulty with his balance.  He has also noted more spinning vertigo, especially when he turns his head quickly.  He denies any otalgia, otorrhea, or sudden change in his hearing.  Exam: General: Communicates without difficulty, well nourished, no acute distress. Head: Normocephalic, no evidence injury, no tenderness, facial buttresses intact without stepoff. Face/sinus: No tenderness to palpation and percussion. Facial movement is normal and symmetric. Eyes: PERRL, EOMI. No scleral icterus, conjunctivae clear. Neuro: CN II exam reveals vision grossly intact.  No nystagmus at any point of gaze. Ears: Auricles well formed without lesions.  Ear canals are intact without mass or lesion.  No erythema or edema is appreciated.  The TMs are intact without fluid. Nose: External evaluation reveals  normal support and skin without lesions.  Dorsum is intact.  Anterior rhinoscopy reveals congested mucosa over anterior aspect of inferior turbinates and intact septum.  No purulence noted. Oral:  Oral cavity and oropharynx are intact, symmetric, without erythema or edema.  Mucosa is moist without lesions. Neck: Full range of motion without pain.  There is no significant lymphadenopathy.  No masses palpable.  Thyroid  bed within normal limits to palpation.  Parotid glands and submandibular glands equal bilaterally without mass.  Trachea is midline. Neuro:  CN 2-12 grossly intact.  Gait: Wide-based. Vestibular: Dix-Hallpike produces rotational nystagmus with left-sided positioning. Vestibular: There is no nystagmus with pneumatic pressure on either tympanic membrane or Valsalva. The cerebellar examination is unremarkable.    Procedure: Left-sided Epley maneuver  Anesthesia: None Indication: To treat the left BPPV Desciption:  The patient is first placed in a left-sided Dix-Hallpike position. After the vertigo has subsided, the head is gradually rotated from the left to the right, completing a 180 turn. The patient is then returned to the upright position. The patient tolerated the procedure well without any difficulty.    Assessment: 1.  Recurrent dizziness, secondary to an acute left benign paroxysmal positional vertigo.  His left Dix-Hallpike maneuver is positive today. 2.  His balance difficulty is likely a result of multiple contributing factors.  He has a history of left ear Mnire's disease and multiple risk factors. 3.  Subjectively stable bilateral sensorineural hearing loss. 4.  His ear canals, tympanic membranes, and middle ear spaces are normal.  No middle ear effusion is noted.  Plan: 1.  The physical exam findings are reviewed with the patient. 2.  The left Epley maneuver is performed  today without difficulty. 3.  Post Epley activity restrictions are again reviewed. 4.  The patient's  wife would like to learn the Epley maneuver.  She is taught on how to perform the maneuver at home. 5.  The pathophysiology of BPPV is explained to the patient and his wife.  The underlying principals of the Epley maneuver are reviewed. 6.  Continue with a 1500 mg low-salt diet. 7.  The patient may continue the use of Valium  as needed. 8.  The patient will return for reevaluation in 2 months.

## 2024-03-11 ENCOUNTER — Other Ambulatory Visit: Payer: Self-pay | Admitting: Cardiology

## 2024-03-22 ENCOUNTER — Ambulatory Visit (INDEPENDENT_AMBULATORY_CARE_PROVIDER_SITE_OTHER): Admitting: Otolaryngology

## 2024-04-22 ENCOUNTER — Encounter (INDEPENDENT_AMBULATORY_CARE_PROVIDER_SITE_OTHER): Payer: Self-pay | Admitting: Otolaryngology

## 2024-04-22 ENCOUNTER — Ambulatory Visit (INDEPENDENT_AMBULATORY_CARE_PROVIDER_SITE_OTHER): Admitting: Otolaryngology

## 2024-04-22 VITALS — BP 128/81 | HR 79 | Ht 74.0 in | Wt 203.0 lb

## 2024-04-22 DIAGNOSIS — H903 Sensorineural hearing loss, bilateral: Secondary | ICD-10-CM | POA: Diagnosis not present

## 2024-04-22 DIAGNOSIS — R42 Dizziness and giddiness: Secondary | ICD-10-CM | POA: Diagnosis not present

## 2024-04-23 NOTE — Progress Notes (Signed)
 Patient ID: Andrew Bates, male   DOB: June 28, 1945, 78 y.o.   MRN: [id number]  Follow-up: Recurrent dizziness, hearing loss  HPI: The patient is a 78 year old male who returns today for his follow-up evaluation.  The patient has a complex medical history, including frequent recurrent dizziness, bilateral sensorineural hearing loss, left ear Mnire's disease, coronary artery disease, hypertension, TIAs, and recurrent BPPV.  The patient was previously treated with low-salt diet, diuretics, physical therapy, left endolymphatic sac decompression surgery, and benzodiazepines.  Over the past year, he has been having more recurrent dizziness.  He has had multiple falls.  At his last visit in September 2025, his left Dix-Hallpike maneuver was positive.  He was treated with left Epley maneuver.  The patient returns today reporting 3 additional episodes of spinning vertigo.  However, the severity has decreased.  He continues to have bilateral sensorineural hearing loss.  He has not had any falls.  He is still very off balance.  Currently he denies any otalgia, otorrhea, or vertigo.  Exam: General: Communicates without difficulty, well nourished, no acute distress. Head: Normocephalic, no evidence injury, no tenderness, facial buttresses intact without stepoff. Face/sinus: No tenderness to palpation and percussion. Facial movement is normal and symmetric. Eyes: PERRL, EOMI. No scleral icterus, conjunctivae clear. Neuro: CN II exam reveals vision grossly intact.  No nystagmus at any point of gaze. Ears: Auricles well formed without lesions.  Ear canals are intact without mass or lesion.  No erythema or edema is appreciated.  The TMs are intact without fluid. Nose: External evaluation reveals normal support and skin without lesions.  Dorsum is intact.  Anterior rhinoscopy reveals congested mucosa over anterior aspect of inferior turbinates and intact septum.  No purulence noted. Oral:  Oral cavity and oropharynx are  intact, symmetric, without erythema or edema.  Mucosa is moist without lesions. Neck: Full range of motion without pain.  There is no significant lymphadenopathy.  No masses palpable.  Thyroid  bed within normal limits to palpation.  Parotid glands and submandibular glands equal bilaterally without mass.  Trachea is midline. Neuro:  CN 2-12 grossly intact.  Gait: Wide-based. Vestibular: Dix-Hallpike negative. Vestibular: There is no nystagmus with pneumatic pressure on either tympanic membrane or Valsalva. The cerebellar examination is unremarkable.    Assessment: 1.  Recurrent dizziness, with possible multifactorial causes.  The most likely possibility is exacerbation of his Mnire's disease.  His most recent BPPV has resolved.  His Dix-Hallpike maneuver is negative today.  He also has multiple other risk factors and medical issues, including frequent TIAs. 2.  Subjectively stable bilateral sensorineural hearing loss. 3.  His ear canals, tympanic membranes, and middle ear spaces are normal.  Plan: 1.  The physical exam findings are reviewed with the patient. 2.  He is reassured that his Dix-Hallpike maneuver is negative today. 3.  The pathophysiology of Mnire's disease and BPPV are extensively discussed. 4.  Continue with the 1500 mg low-salt diet. 5.  Use of Zofran  and diazepam  as needed. 6.  The patient will return for reevaluation in 3 months.

## 2024-05-09 DIAGNOSIS — I10 Essential (primary) hypertension: Secondary | ICD-10-CM | POA: Diagnosis not present

## 2024-05-09 DIAGNOSIS — E785 Hyperlipidemia, unspecified: Secondary | ICD-10-CM | POA: Diagnosis not present

## 2024-05-09 DIAGNOSIS — F4310 Post-traumatic stress disorder, unspecified: Secondary | ICD-10-CM | POA: Diagnosis not present

## 2024-05-09 DIAGNOSIS — Z8673 Personal history of transient ischemic attack (TIA), and cerebral infarction without residual deficits: Secondary | ICD-10-CM | POA: Diagnosis not present

## 2024-05-09 DIAGNOSIS — R7309 Other abnormal glucose: Secondary | ICD-10-CM | POA: Diagnosis not present

## 2024-05-09 DIAGNOSIS — I2510 Atherosclerotic heart disease of native coronary artery without angina pectoris: Secondary | ICD-10-CM | POA: Diagnosis not present

## 2024-05-09 DIAGNOSIS — Z23 Encounter for immunization: Secondary | ICD-10-CM | POA: Diagnosis not present

## 2024-07-15 ENCOUNTER — Ambulatory Visit (INDEPENDENT_AMBULATORY_CARE_PROVIDER_SITE_OTHER): Admitting: Otolaryngology

## 2024-07-15 ENCOUNTER — Encounter (INDEPENDENT_AMBULATORY_CARE_PROVIDER_SITE_OTHER): Payer: Self-pay | Admitting: Otolaryngology

## 2024-07-15 VITALS — BP 142/72 | HR 68

## 2024-07-15 DIAGNOSIS — H8111 Benign paroxysmal vertigo, right ear: Secondary | ICD-10-CM | POA: Diagnosis not present

## 2024-07-15 DIAGNOSIS — H903 Sensorineural hearing loss, bilateral: Secondary | ICD-10-CM

## 2024-07-15 DIAGNOSIS — R42 Dizziness and giddiness: Secondary | ICD-10-CM

## 2024-07-15 DIAGNOSIS — H8102 Meniere's disease, left ear: Secondary | ICD-10-CM | POA: Diagnosis not present

## 2024-07-15 NOTE — Progress Notes (Signed)
 Patient ID: Andrew Bates, male   DOB: January 25, 1946, 79 y.o.   MRN: [id number]  CC: Recurrent dizziness, Mnire's disease, hearing loss  HPI: The patient is a 79 year old male who returns today for his follow-up evaluation.  The patient has a complex medical history, including recurrent dizziness, bilateral sensorineural hearing loss, left ear Mnire's disease, hypertension, coronary artery disease, TIAs, and recurrent BPPV. He was previously treated with left endolymphatic sac decompression surgery in 2020, with good control of his dizziness for several years. Over the past year, the patient has noted more recurrent dizziness. His clinical course was complicated by TIAs, visual loss, and multiple falls.  At his last visit in October 2025, his symptoms were under control.  His bilateral hearing loss was subjectively stable.  He was continue on his low-salt diet, Zofran , and diazepam  as needed.  The patient returns today complaining of 2 episodes of spinning vertigo over the past few weeks.  He still has occasional difficulty with his balance.  He denies any otalgia, otorrhea, or sudden change in his hearing.  Exam: General: Communicates without difficulty, well nourished, no acute distress. Head: Normocephalic, no evidence injury, no tenderness, facial buttresses intact without stepoff. Face/sinus: No tenderness to palpation and percussion. Facial movement is normal and symmetric. Eyes: PERRL, EOMI. No scleral icterus, conjunctivae clear. Neuro: CN II exam reveals vision grossly intact.  No nystagmus at any point of gaze. Ears: Auricles well formed without lesions.  Ear canals are intact without mass or lesion.  No erythema or edema is appreciated.  The TMs are intact without fluid. Nose: External evaluation reveals normal support and skin without lesions.  Dorsum is intact.  Anterior rhinoscopy reveals congested mucosa over anterior aspect of inferior turbinates and intact septum.  No purulence noted. Oral:   Oral cavity and oropharynx are intact, symmetric, without erythema or edema.  Mucosa is moist without lesions. Neck: Full range of motion without pain.  There is no significant lymphadenopathy.  No masses palpable.  Thyroid  bed within normal limits to palpation.  Parotid glands and submandibular glands equal bilaterally without mass.  Trachea is midline. Neuro:  CN 2-12 grossly intact.  Gait: Wide-based. Vestibular: Dix-Hallpike produces rotational nystagmus with right-sided positioning. Vestibular: There is no nystagmus with pneumatic pressure on either tympanic membrane or Valsalva. The cerebellar examination is unremarkable.    Procedure: Right-sided Epley maneuver  Anesthesia: None Indication: To treat the right BPPV Desciption:  The patient is first placed in a right-sided Dix-Hallpike position. After the vertigo has subsided, the head is gradually rotated from the right to the left, completing a 180 turn. The patient is then returned to the upright position. The patient tolerated the procedure well without any difficulty.    Assessment: 1.  Recurrent dizziness, secondary to an acute right benign paroxysmal positional vertigo.  His right Dix-Hallpike maneuver is positive today. 2.  His balance difficulty is likely a result of multiple contributing factors.  He has a history of left ear Mnire's disease and multiple risk factors. 3.  Subjectively stable bilateral sensorineural hearing loss. 4.  His ear canals, tympanic membranes, and middle ear spaces are normal.  No middle ear effusion or infection is noted.  Plan: 1.  The physical exam findings are reviewed with the patient. 2.  The right Epley maneuver is performed today without difficulty. 3.  Post Epley activity restrictions are again reviewed. 4.  The pathophysiology of BPPV is explained to the patient and his wife.  The underlying principals of the  Epley maneuver are reviewed. 5.  Continue with a 1500 mg low-salt diet. 7.  The patient  may continue the use of Valium  as needed. 8.  The patient will return for reevaluation in 3 months.

## 2024-10-14 ENCOUNTER — Ambulatory Visit (INDEPENDENT_AMBULATORY_CARE_PROVIDER_SITE_OTHER): Admitting: Otolaryngology
